# Patient Record
Sex: Female | Born: 1940 | Race: White | Hispanic: No | Smoking: Former smoker
Health system: Southern US, Community
[De-identification: ages and names within clinical notes are randomized; demographics above are authoritative.]

## PROBLEM LIST (undated history)

## (undated) DIAGNOSIS — I499 Cardiac arrhythmia, unspecified: Secondary | ICD-10-CM

## (undated) DIAGNOSIS — F329 Major depressive disorder, single episode, unspecified: Secondary | ICD-10-CM

## (undated) DIAGNOSIS — I6350 Cerebral infarction due to unspecified occlusion or stenosis of unspecified cerebral artery: Secondary | ICD-10-CM

## (undated) DIAGNOSIS — R51 Headache: Secondary | ICD-10-CM

## (undated) DIAGNOSIS — R519 Headache, unspecified: Secondary | ICD-10-CM

## (undated) DIAGNOSIS — L853 Xerosis cutis: Secondary | ICD-10-CM

## (undated) DIAGNOSIS — D649 Anemia, unspecified: Secondary | ICD-10-CM

## (undated) DIAGNOSIS — M1990 Unspecified osteoarthritis, unspecified site: Secondary | ICD-10-CM

## (undated) DIAGNOSIS — E785 Hyperlipidemia, unspecified: Secondary | ICD-10-CM

## (undated) DIAGNOSIS — I82409 Acute embolism and thrombosis of unspecified deep veins of unspecified lower extremity: Secondary | ICD-10-CM

## (undated) DIAGNOSIS — I219 Acute myocardial infarction, unspecified: Secondary | ICD-10-CM

## (undated) DIAGNOSIS — IMO0001 Reserved for inherently not codable concepts without codable children: Secondary | ICD-10-CM

## (undated) DIAGNOSIS — I2721 Secondary pulmonary arterial hypertension: Secondary | ICD-10-CM

## (undated) DIAGNOSIS — F32A Depression, unspecified: Secondary | ICD-10-CM

## (undated) DIAGNOSIS — J449 Chronic obstructive pulmonary disease, unspecified: Secondary | ICD-10-CM

## (undated) DIAGNOSIS — K219 Gastro-esophageal reflux disease without esophagitis: Secondary | ICD-10-CM

## (undated) DIAGNOSIS — N189 Chronic kidney disease, unspecified: Secondary | ICD-10-CM

## (undated) DIAGNOSIS — I10 Essential (primary) hypertension: Secondary | ICD-10-CM

## (undated) DIAGNOSIS — I2510 Atherosclerotic heart disease of native coronary artery without angina pectoris: Secondary | ICD-10-CM

## (undated) DIAGNOSIS — I509 Heart failure, unspecified: Secondary | ICD-10-CM

## (undated) HISTORY — PX: TONSILLECTOMY: SUR1361

## (undated) HISTORY — DX: Cerebral infarction due to unspecified occlusion or stenosis of unspecified cerebral artery: I63.50

## (undated) HISTORY — DX: Hyperlipidemia, unspecified: E78.5

## (undated) HISTORY — DX: Anemia, unspecified: D64.9

## (undated) HISTORY — PX: TUBAL LIGATION: SHX77

## (undated) HISTORY — DX: Atherosclerotic heart disease of native coronary artery without angina pectoris: I25.10

## (undated) HISTORY — DX: Acute myocardial infarction, unspecified: I21.9

## (undated) HISTORY — DX: Chronic obstructive pulmonary disease, unspecified: J44.9

## (undated) HISTORY — DX: Essential (primary) hypertension: I10

## (undated) HISTORY — PX: EYE SURGERY: SHX253

## (undated) HISTORY — DX: Chronic kidney disease, unspecified: N18.9

## (undated) HISTORY — DX: Acute embolism and thrombosis of unspecified deep veins of unspecified lower extremity: I82.409

## (undated) HISTORY — PX: OTHER SURGICAL HISTORY: SHX169

---

## 1967-12-10 HISTORY — PX: APPENDECTOMY: SHX54

## 1998-05-29 ENCOUNTER — Emergency Department (HOSPITAL_COMMUNITY): Admission: EM | Admit: 1998-05-29 | Discharge: 1998-05-29 | Payer: Self-pay | Admitting: *Deleted

## 2006-12-09 DIAGNOSIS — I219 Acute myocardial infarction, unspecified: Secondary | ICD-10-CM

## 2006-12-09 HISTORY — DX: Acute myocardial infarction, unspecified: I21.9

## 2007-06-23 ENCOUNTER — Inpatient Hospital Stay (HOSPITAL_COMMUNITY): Admission: EM | Admit: 2007-06-23 | Discharge: 2007-06-26 | Payer: Self-pay | Admitting: Emergency Medicine

## 2007-06-23 ENCOUNTER — Ambulatory Visit: Payer: Self-pay | Admitting: Cardiovascular Disease

## 2007-06-23 HISTORY — PX: CORONARY ANGIOPLASTY WITH STENT PLACEMENT: SHX49

## 2007-07-09 ENCOUNTER — Encounter (HOSPITAL_COMMUNITY): Admission: RE | Admit: 2007-07-09 | Discharge: 2007-10-07 | Payer: Self-pay | Admitting: Interventional Cardiology

## 2007-07-14 ENCOUNTER — Ambulatory Visit (HOSPITAL_COMMUNITY): Admission: RE | Admit: 2007-07-14 | Discharge: 2007-07-15 | Payer: Self-pay | Admitting: Interventional Cardiology

## 2007-10-08 ENCOUNTER — Encounter (HOSPITAL_COMMUNITY): Admission: RE | Admit: 2007-10-08 | Discharge: 2007-10-19 | Payer: Self-pay | Admitting: Interventional Cardiology

## 2009-04-12 ENCOUNTER — Encounter: Admission: RE | Admit: 2009-04-12 | Discharge: 2009-04-12 | Payer: Self-pay | Admitting: Family Medicine

## 2010-01-16 ENCOUNTER — Ambulatory Visit: Payer: Self-pay | Admitting: Family Medicine

## 2010-01-16 ENCOUNTER — Inpatient Hospital Stay (HOSPITAL_COMMUNITY): Admission: EM | Admit: 2010-01-16 | Discharge: 2010-01-19 | Payer: Self-pay | Admitting: Emergency Medicine

## 2010-01-16 LAB — CONVERTED CEMR LAB
Hgb A1c MFr Bld: 15.1 %
TSH: 0.938 microintl units/mL

## 2010-01-17 LAB — CONVERTED CEMR LAB
Cholesterol: 130 mg/dL
HDL: 32 mg/dL
LDL Cholesterol: 65 mg/dL
Triglycerides: 167 mg/dL

## 2010-01-18 ENCOUNTER — Ambulatory Visit: Payer: Self-pay | Admitting: Physical Medicine & Rehabilitation

## 2010-01-19 ENCOUNTER — Encounter: Payer: Self-pay | Admitting: Family Medicine

## 2010-01-22 ENCOUNTER — Encounter: Payer: Self-pay | Admitting: Family Medicine

## 2010-01-22 ENCOUNTER — Telehealth: Payer: Self-pay | Admitting: Family Medicine

## 2010-01-26 ENCOUNTER — Telehealth: Payer: Self-pay | Admitting: Family Medicine

## 2010-01-29 ENCOUNTER — Telehealth: Payer: Self-pay | Admitting: Family Medicine

## 2010-02-05 ENCOUNTER — Encounter: Payer: Self-pay | Admitting: Family Medicine

## 2010-02-15 ENCOUNTER — Encounter: Payer: Self-pay | Admitting: Family Medicine

## 2010-02-19 ENCOUNTER — Ambulatory Visit: Payer: Self-pay | Admitting: Family Medicine

## 2010-02-19 ENCOUNTER — Encounter: Payer: Self-pay | Admitting: Family Medicine

## 2010-02-19 DIAGNOSIS — J449 Chronic obstructive pulmonary disease, unspecified: Secondary | ICD-10-CM

## 2010-02-19 DIAGNOSIS — I2510 Atherosclerotic heart disease of native coronary artery without angina pectoris: Secondary | ICD-10-CM

## 2010-02-19 DIAGNOSIS — I10 Essential (primary) hypertension: Secondary | ICD-10-CM | POA: Insufficient documentation

## 2010-02-19 DIAGNOSIS — E785 Hyperlipidemia, unspecified: Secondary | ICD-10-CM

## 2010-02-19 DIAGNOSIS — E1129 Type 2 diabetes mellitus with other diabetic kidney complication: Secondary | ICD-10-CM | POA: Insufficient documentation

## 2010-02-19 DIAGNOSIS — I6350 Cerebral infarction due to unspecified occlusion or stenosis of unspecified cerebral artery: Secondary | ICD-10-CM

## 2010-02-19 HISTORY — DX: Cerebral infarction due to unspecified occlusion or stenosis of unspecified cerebral artery: I63.50

## 2010-02-19 HISTORY — DX: Atherosclerotic heart disease of native coronary artery without angina pectoris: I25.10

## 2010-02-20 LAB — CONVERTED CEMR LAB
ALT: 13 units/L (ref 0–35)
BUN: 37 mg/dL — ABNORMAL HIGH (ref 6–23)
CO2: 19 meq/L (ref 19–32)
Calcium: 9.6 mg/dL (ref 8.4–10.5)
Creatinine, Ser: 2.07 mg/dL — ABNORMAL HIGH (ref 0.40–1.20)
Glucose, Bld: 119 mg/dL — ABNORMAL HIGH (ref 70–99)
Potassium: 5.1 meq/L (ref 3.5–5.3)
Sodium: 138 meq/L (ref 135–145)

## 2010-02-21 ENCOUNTER — Ambulatory Visit (HOSPITAL_COMMUNITY): Admission: RE | Admit: 2010-02-21 | Discharge: 2010-02-21 | Payer: Self-pay | Admitting: Family Medicine

## 2010-02-21 ENCOUNTER — Ambulatory Visit: Payer: Self-pay | Admitting: Vascular Surgery

## 2010-02-21 ENCOUNTER — Encounter: Payer: Self-pay | Admitting: Family Medicine

## 2010-02-21 ENCOUNTER — Telehealth: Payer: Self-pay | Admitting: Family Medicine

## 2010-03-06 ENCOUNTER — Encounter: Payer: Self-pay | Admitting: Family Medicine

## 2010-03-06 ENCOUNTER — Ambulatory Visit: Payer: Self-pay | Admitting: Family Medicine

## 2010-03-06 LAB — CONVERTED CEMR LAB
BUN: 36 mg/dL — ABNORMAL HIGH (ref 6–23)
CO2: 21 meq/L (ref 19–32)
Calcium: 9.3 mg/dL (ref 8.4–10.5)
Chloride: 107 meq/L (ref 96–112)
Creatinine, Ser: 2.11 mg/dL — ABNORMAL HIGH (ref 0.40–1.20)
Glucose, Bld: 105 mg/dL — ABNORMAL HIGH (ref 70–99)
Potassium: 5.2 meq/L (ref 3.5–5.3)
Sodium: 138 meq/L (ref 135–145)

## 2010-03-08 ENCOUNTER — Ambulatory Visit: Payer: Self-pay | Admitting: Family Medicine

## 2010-03-08 DIAGNOSIS — I82409 Acute embolism and thrombosis of unspecified deep veins of unspecified lower extremity: Secondary | ICD-10-CM

## 2010-03-08 HISTORY — DX: Acute embolism and thrombosis of unspecified deep veins of unspecified lower extremity: I82.409

## 2010-03-08 LAB — CONVERTED CEMR LAB
Bilirubin Urine: NEGATIVE
Glucose, Urine, Semiquant: NEGATIVE
Ketones, urine, test strip: NEGATIVE
Nitrite: NEGATIVE
Protein, U semiquant: >=300
Specific Gravity, Urine: 1.015
Urobilinogen, UA: 0.2
pH: 6

## 2010-03-15 ENCOUNTER — Telehealth (INDEPENDENT_AMBULATORY_CARE_PROVIDER_SITE_OTHER): Payer: Self-pay | Admitting: Family Medicine

## 2010-03-22 ENCOUNTER — Encounter: Payer: Self-pay | Admitting: *Deleted

## 2010-03-22 LAB — CONVERTED CEMR LAB
Calcium: 10.7 mg/dL
Creatinine, Ser: 1.9 mg/dL

## 2010-03-26 ENCOUNTER — Encounter: Payer: Self-pay | Admitting: Family Medicine

## 2010-03-26 ENCOUNTER — Ambulatory Visit: Payer: Self-pay | Admitting: Family Medicine

## 2010-03-26 DIAGNOSIS — N184 Chronic kidney disease, stage 4 (severe): Secondary | ICD-10-CM | POA: Insufficient documentation

## 2010-03-26 DIAGNOSIS — K219 Gastro-esophageal reflux disease without esophagitis: Secondary | ICD-10-CM

## 2010-03-26 LAB — CONVERTED CEMR LAB
BUN: 31 mg/dL — ABNORMAL HIGH (ref 6–23)
CO2: 26 meq/L (ref 19–32)
Calcium: 11.1 mg/dL — ABNORMAL HIGH (ref 8.4–10.5)
Chloride: 106 meq/L (ref 96–112)
Creatinine, Ser: 1.81 mg/dL — ABNORMAL HIGH (ref 0.40–1.20)
Glucose, Bld: 113 mg/dL — ABNORMAL HIGH (ref 70–99)
Hgb A1c MFr Bld: 7.8 %
Potassium: 4.7 meq/L (ref 3.5–5.3)
Sodium: 143 meq/L (ref 135–145)

## 2010-03-29 ENCOUNTER — Ambulatory Visit: Payer: Self-pay | Admitting: Family Medicine

## 2010-03-29 ENCOUNTER — Encounter: Payer: Self-pay | Admitting: *Deleted

## 2010-03-29 ENCOUNTER — Encounter: Payer: Self-pay | Admitting: Family Medicine

## 2010-04-04 ENCOUNTER — Encounter: Payer: Self-pay | Admitting: Family Medicine

## 2010-04-16 ENCOUNTER — Encounter (HOSPITAL_COMMUNITY): Admission: RE | Admit: 2010-04-16 | Discharge: 2010-06-21 | Payer: Self-pay | Admitting: Nephrology

## 2010-04-17 ENCOUNTER — Ambulatory Visit: Payer: Self-pay | Admitting: Family Medicine

## 2010-04-19 ENCOUNTER — Encounter: Payer: Self-pay | Admitting: Family Medicine

## 2010-04-20 ENCOUNTER — Ambulatory Visit: Payer: Self-pay | Admitting: Oncology

## 2010-04-27 ENCOUNTER — Ambulatory Visit (HOSPITAL_COMMUNITY): Admission: RE | Admit: 2010-04-27 | Discharge: 2010-04-27 | Payer: Self-pay | Admitting: Oncology

## 2010-04-27 ENCOUNTER — Encounter: Payer: Self-pay | Admitting: Family Medicine

## 2010-04-27 LAB — CBC WITH DIFFERENTIAL/PLATELET
BASO%: 1.1 % (ref 0.0–2.0)
EOS%: 3.5 % (ref 0.0–7.0)
Eosinophils Absolute: 0.2 10*3/uL (ref 0.0–0.5)
HCT: 28.3 % — ABNORMAL LOW (ref 34.8–46.6)
MCH: 29.2 pg (ref 25.1–34.0)
MCHC: 34.1 g/dL (ref 31.5–36.0)
NEUT#: 3.9 10*3/uL (ref 1.5–6.5)
Platelets: 205 10*3/uL (ref 145–400)
RBC: 3.31 10*6/uL — ABNORMAL LOW (ref 3.70–5.45)
WBC: 6.4 10*3/uL (ref 3.9–10.3)

## 2010-04-30 ENCOUNTER — Ambulatory Visit: Payer: Self-pay | Admitting: Family Medicine

## 2010-05-01 LAB — COMPREHENSIVE METABOLIC PANEL
AST: 17 U/L (ref 0–37)
Alkaline Phosphatase: 69 U/L (ref 39–117)
CO2: 21 mEq/L (ref 19–32)
Creatinine, Ser: 2.08 mg/dL — ABNORMAL HIGH (ref 0.40–1.20)
Glucose, Bld: 91 mg/dL (ref 70–99)
Total Bilirubin: 0.3 mg/dL (ref 0.3–1.2)

## 2010-05-01 LAB — SPEP & IFE WITH QIG
Albumin ELP: 49.5 % — ABNORMAL LOW (ref 55.8–66.1)
Alpha-1-Globulin: 5.1 % — ABNORMAL HIGH (ref 2.9–4.9)
Beta Globulin: 7 % (ref 4.7–7.2)
Gamma Globulin: 15.8 % (ref 11.1–18.8)
M-Spike, %: 0.39 g/dL
Total Protein, Serum Electrophoresis: 6.8 g/dL (ref 6.0–8.3)

## 2010-05-03 LAB — UIFE/LIGHT CHAINS/TP QN, 24-HR UR
Albumin, U: DETECTED
Alpha 1, Urine: DETECTED — AB
Free Kappa Lt Chains,Ur: 14.3 mg/dL — ABNORMAL HIGH (ref 0.04–1.51)
Time: 24 hours
Total Protein, Urine-Ur/day: 2747 mg/d — ABNORMAL HIGH (ref 10–140)
Total Protein, Urine: 130.8 mg/dL
Volume, Urine: 2100 mL

## 2010-05-09 ENCOUNTER — Ambulatory Visit (HOSPITAL_COMMUNITY): Admission: RE | Admit: 2010-05-09 | Discharge: 2010-05-09 | Payer: Self-pay | Admitting: Oncology

## 2010-05-14 ENCOUNTER — Ambulatory Visit: Payer: Self-pay | Admitting: Family Medicine

## 2010-05-23 ENCOUNTER — Ambulatory Visit: Payer: Self-pay | Admitting: Oncology

## 2010-05-25 ENCOUNTER — Encounter: Payer: Self-pay | Admitting: Family Medicine

## 2010-06-26 ENCOUNTER — Encounter: Payer: Self-pay | Admitting: Family Medicine

## 2010-07-02 ENCOUNTER — Ambulatory Visit: Payer: Self-pay | Admitting: Family Medicine

## 2010-07-02 DIAGNOSIS — D472 Monoclonal gammopathy: Secondary | ICD-10-CM | POA: Insufficient documentation

## 2010-07-09 ENCOUNTER — Ambulatory Visit: Payer: Self-pay | Admitting: Oncology

## 2010-07-11 ENCOUNTER — Encounter: Payer: Self-pay | Admitting: Family Medicine

## 2010-07-11 LAB — CBC WITH DIFFERENTIAL/PLATELET
Eosinophils Absolute: 0.2 10*3/uL (ref 0.0–0.5)
HCT: 31 % — ABNORMAL LOW (ref 34.8–46.6)
HGB: 10.3 g/dL — ABNORMAL LOW (ref 11.6–15.9)
LYMPH%: 16.8 % (ref 14.0–49.7)
MCHC: 33.4 g/dL (ref 31.5–36.0)
MCV: 85.3 fL (ref 79.5–101.0)
MONO#: 0.5 10*3/uL (ref 0.1–0.9)
Platelets: 201 10*3/uL (ref 145–400)
RDW: 16.9 % — ABNORMAL HIGH (ref 11.2–14.5)
WBC: 9.9 10*3/uL (ref 3.9–10.3)
lymph#: 1.7 10*3/uL (ref 0.9–3.3)

## 2010-07-13 LAB — KAPPA/LAMBDA LIGHT CHAINS
Kappa free light chain: 3.28 mg/dL — ABNORMAL HIGH (ref 0.33–1.94)
Lambda Free Lght Chn: 3.62 mg/dL — ABNORMAL HIGH (ref 0.57–2.63)

## 2010-07-13 LAB — SPEP & IFE WITH QIG
Albumin ELP: 50.5 % — ABNORMAL LOW (ref 55.8–66.1)
Alpha-2-Globulin: 14.4 % — ABNORMAL HIGH (ref 7.1–11.8)
Beta 2: 5 % (ref 3.2–6.5)
Beta Globulin: 7.5 % — ABNORMAL HIGH (ref 4.7–7.2)
Gamma Globulin: 15.4 % (ref 11.1–18.8)
IgG (Immunoglobin G), Serum: 1090 mg/dL (ref 694–1618)
M-Spike, %: 0.37 g/dL

## 2010-07-13 LAB — COMPREHENSIVE METABOLIC PANEL
ALT: 14 U/L (ref 0–35)
AST: 16 U/L (ref 0–37)
Albumin: 3.8 g/dL (ref 3.5–5.2)
Calcium: 9.5 mg/dL (ref 8.4–10.5)
Total Bilirubin: 0.3 mg/dL (ref 0.3–1.2)

## 2010-08-02 ENCOUNTER — Encounter: Payer: Self-pay | Admitting: Family Medicine

## 2010-09-11 ENCOUNTER — Encounter: Payer: Self-pay | Admitting: Family Medicine

## 2010-09-26 ENCOUNTER — Encounter: Payer: Self-pay | Admitting: Family Medicine

## 2010-10-09 ENCOUNTER — Ambulatory Visit: Payer: Self-pay | Admitting: Oncology

## 2010-10-11 ENCOUNTER — Encounter: Payer: Self-pay | Admitting: Family Medicine

## 2010-10-11 LAB — CBC WITH DIFFERENTIAL/PLATELET
Eosinophils Absolute: 0.2 10*3/uL (ref 0.0–0.5)
HCT: 33.4 % — ABNORMAL LOW (ref 34.8–46.6)
HGB: 11.4 g/dL — ABNORMAL LOW (ref 11.6–15.9)
MCHC: 34 g/dL (ref 31.5–36.0)
MCV: 83.2 fL (ref 79.5–101.0)
MONO#: 0.6 10*3/uL (ref 0.1–0.9)
Platelets: 200 10*3/uL (ref 145–400)
RDW: 16.9 % — ABNORMAL HIGH (ref 11.2–14.5)
WBC: 8 10*3/uL (ref 3.9–10.3)

## 2010-10-15 LAB — COMPREHENSIVE METABOLIC PANEL
ALT: 15 U/L (ref 0–35)
Albumin: 4.5 g/dL (ref 3.5–5.2)
BUN: 60 mg/dL — ABNORMAL HIGH (ref 6–23)
Calcium: 10.1 mg/dL (ref 8.4–10.5)
Creatinine, Ser: 2.26 mg/dL — ABNORMAL HIGH (ref 0.40–1.20)
Sodium: 140 mEq/L (ref 135–145)

## 2010-10-15 LAB — SPEP & IFE WITH QIG
Albumin ELP: 51.3 % — ABNORMAL LOW (ref 55.8–66.1)
Alpha-1-Globulin: 4.6 % (ref 2.9–4.9)
Alpha-2-Globulin: 14 % — ABNORMAL HIGH (ref 7.1–11.8)
Beta 2: 5.8 % (ref 3.2–6.5)
Beta Globulin: 7.4 % — ABNORMAL HIGH (ref 4.7–7.2)
Gamma Globulin: 16.9 % (ref 11.1–18.8)
IgA: 329 mg/dL (ref 68–378)
IgG (Immunoglobin G), Serum: 1140 mg/dL (ref 694–1618)
IgM, Serum: 284 mg/dL — ABNORMAL HIGH (ref 60–263)
M-Spike, %: 0.55 g/dL
Total Protein, Serum Electrophoresis: 7.5 g/dL (ref 6.0–8.3)

## 2010-10-15 LAB — KAPPA/LAMBDA LIGHT CHAINS
Kappa free light chain: 3.53 mg/dL — ABNORMAL HIGH (ref 0.33–1.94)
Lambda Free Lght Chn: 4.27 mg/dL — ABNORMAL HIGH (ref 0.57–2.63)

## 2010-10-19 ENCOUNTER — Encounter: Payer: Self-pay | Admitting: Family Medicine

## 2010-11-20 ENCOUNTER — Telehealth: Payer: Self-pay | Admitting: Family Medicine

## 2010-12-30 ENCOUNTER — Encounter: Payer: Self-pay | Admitting: Oncology

## 2011-01-01 ENCOUNTER — Encounter: Payer: Self-pay | Admitting: Family Medicine

## 2011-01-06 LAB — CONVERTED CEMR LAB
ALT: 15 units/L (ref 0–35)
AST: 17 units/L (ref 0–37)
Albumin: 3.6 g/dL (ref 3.5–5.2)
Alkaline Phosphatase: 65 units/L (ref 39–117)
BUN: 39 mg/dL — ABNORMAL HIGH (ref 6–23)
CO2: 22 meq/L (ref 19–32)
Calcium, Total (PTH): 9.9 mg/dL (ref 8.4–10.5)
Calcium: 9.9 mg/dL (ref 8.4–10.5)
Chloride: 107 meq/L (ref 96–112)
Creatinine, Ser: 2.06 mg/dL — ABNORMAL HIGH (ref 0.40–1.20)
Glucose, Bld: 94 mg/dL (ref 70–99)
PTH: 39.4 pg/mL (ref 14.0–72.0)
Potassium: 4.5 meq/L (ref 3.5–5.3)
Sodium: 140 meq/L (ref 135–145)
Total Bilirubin: 0.3 mg/dL (ref 0.3–1.2)
Total Protein: 6.6 g/dL (ref 6.0–8.3)

## 2011-01-08 NOTE — Consult Note (Signed)
Summary: [facility]  [facility]   Imported By: De Nurse 10/18/2010 12:09:15  _____________________________________________________________________  External Attachment:    Type:   Image     Comment:   External Document

## 2011-01-08 NOTE — Consult Note (Signed)
Summary: [facility]  [facility]   Imported By: Clydell Hakim 05/16/2010 16:02:06  _____________________________________________________________________  External Attachment:    Type:   Image     Comment:   External Document

## 2011-01-08 NOTE — Assessment & Plan Note (Signed)
Summary: F/U VISIT/PER [facility]   Vital Signs:  Patient profile:   70 year old female Height:      61 inches Weight:      185.8 pounds Pulse rate:   66 / minute BP sitting:   134 / 78  (right arm)  Vitals Entered By: Arlyss Repress CMA, (March 08, 2010 1:35 PM) CC: Meet new PCP, elevated Cr. refill meds. referral to diab ctr Is Patient Diabetic? Yes Pain Assessment Patient in pain? no        Primary Care Provider:  Helane Rima DO  CC:  Meet new PCP and elevated Cr. refill meds. referral to diab ctr.  History of Present Illness: 70 yo WF:  Hx Reviewed: Admitted on 01/16/10 and d/c'd on 01/19/2010 w/ dx of weakness, uncontrolled DM, UTI, mild rhabdomyolysis, and ARI.  She states that she left the hospital and went to stay with her daughter to receive outpt therapy when she became confused w/ N/V and elevated blood sugars.  She was then admitted to [facility] hospital on 01/28/10 and d/c'd on 2/28/11with L leg DVT. Pt states that she was never informed of the diagnosis and never had pain but was started on Coumadin.  She has felt great ever since her discharge home.    1. DM:  Currently on Lantus 13 units in the PM with mealtime coverage (usually 2-4 units, never more than 8 units per day).  No adverse effects.  No hypoglycemic events.  CBGs checked 3 times a day and typically run b/w 110-170s.  Pt followed by an opthalmologist and seen within the past year. Patient requests DM education.  2. HTN: No adverse effects from medication.  Not checking it regularly.  No dizziness, HA, CP, palpitations, or swelling.  3. HLD: Tolerating medication.  No adverse effects.  No muscle pain or abd pain.  4. CKD: Baseline seems to be 1.55. TREND: (2/20) = 1.55, (2/28) = 1.6, (3/14) = 2.07 - ARB then held, (3/29) = 2.11. GFR now = 40 (stage 3). The patient denies NSAID use, urine retention. She endorses good fluid intake. She endorses DM retinopathy.    Habits &  Providers  Alcohol-Tobacco-Diet     Tobacco Status: quit  Current Medications (verified): 1)  Relion Short Pen Needles 31g X 8 Mm Misc (Insulin Pen Needle) .... Use As Directed Qid 2)  Onetouch Test  Strp (Glucose Blood) .... Per Instructions Qid 3)  Onetouch Finepoint Lancets  Misc (Lancets) .... Per Instructions Qid 4)  Proventil Hfa 108 (90 Base) Mcg/act Aers (Albuterol Sulfate) .... 2 Puffs Every 4 Hours As Needed For Wheezing 5)  Amlodipine Besylate 5 Mg Tabs (Amlodipine Besylate) .Marland Kitchen.. 1 Tab By Mouth Daily 6)  Metoprolol Tartrate 25 Mg Tabs (Metoprolol Tartrate) .Marland Kitchen.. 1 Tab By Mouth Two Times A Day 7)  Simvastatin 40 Mg Tabs (Simvastatin) 8)  Pantoprazole Sodium 40 Mg Tbec (Pantoprazole Sodium) .Marland Kitchen.. 1 Tab By Mouth Two Times A Day 9)  Advair Diskus 250-50 Mcg/dose Aepb (Fluticasone-Salmeterol) .Marland Kitchen.. 1 Inhalation Two Times A Day 10)  Lantus Solostar 100 Unit/ml Soln (Insulin Glargine) .Marland Kitchen.. 13 Units At Bedtime 11)  Fexofenadine Hcl 180 Mg Tabs (Fexofenadine Hcl) .Marland Kitchen.. 1 Tab By Mouth Daily 12)  Glipizide 5 Mg Xr24h-Tab (Glipizide) .... One By Mouth Daily  Allergies (verified): No Known Drug Allergies  Past History:  Past Medical History: IDDM CAD    - Cardiolyte: No evidence of ischemia or infarction. Normal left ventricular systolic function with EF 69%. Inferior diaphragmatic attenuation  artifact present. (01/29/10) Diastolic CHF    - Echo: EF > 55%. Moderate concentric LVH. Minimal aortic sclerosis. Mild TR. Moderate pulmonary HTN. Diastolic Dysfunction HTN HLD COPD Hx of CVA Left Leg DVT (01/2010)    - Acute DVT involving the peroneal and posterior tibial trunks of left calf. (01/30/10)    - Negative LE Doppler (02/21/10)    - Rx Coumadin Esophagitis    - EGD: LA Grade D reflux esophagitis. Gastritis without hemorrhage. Dudenitis without hemorrhage. Recs: PPI two times a day. Repeat EGD once off anticoagulants. (02/01/10) CKD    - Renal US: No hydronephrosis (01/28/10)    - CR  Trend: (2/20) = 1.55, (2/28) = 1.6, (3/14) = 2.07 - ARB then held, (3/29) = 2.11. GFR now = 40 (stage 3).  Family History: No pertinent FH.  Social History: Currently living with daughter. Divorced Retired.  12th grade education. Former smoker (started 1961, quit 11/2008). Drinks Wine and Liquior once a week. No Drug use. Exercises 3x/week. Cell phone [phone].Smoking Status:  quit  Review of Systems General:  Denies chills and fever. CV:  Denies chest pain or discomfort, palpitations, shortness of breath with exertion, and swelling of feet. Resp:  Denies cough. GI:  Denies change in bowel habits. GU:  Denies dysuria, hematuria, urinary frequency, and urinary hesitancy. Derm:  Denies rash. Neuro:  Denies numbness and tingling.  Physical Exam  General:  VS Reviewed. Elderly, well appearing, NAD. Vitals reviewed and WNL.   Neck:  No deformities, masses, or tenderness noted. Lungs:  Normal respiratory effort, chest expands symmetrically. Lungs are clear to auscultation, no crackles or wheezes. Heart:  Normal rate and regular rhythm. S1 and S2 normal without gallop, murmur, click, rub or other extra sounds. Abdomen:  Soft, NT, ND, no HSM, active BS.  Pulses:  R and L dorsalis pedis and posterior tibial pulses are full and equal bilaterally. Extremities:  No edema. Psych:  Oriented X3, memory intact for recent and remote, normally interactive, good eye contact, not anxious appearing, and not depressed appearing.    Diabetes Management Exam:    Foot Exam (with socks and/or shoes not present):       Sensory-Pinprick/Light touch:          Left medial foot (L-4): normal          Left dorsal foot (L-5): normal          Left lateral foot (S-1): normal          Right medial foot (L-4): normal          Right dorsal foot (L-5): normal          Right lateral foot (S-1): normal       Sensory-Monofilament:          Left foot: normal          Right foot: normal       Inspection:           Left foot: normal          Right foot: normal       Nails:          Left foot: normal          Right foot: normal   Impression & Recommendations:  Problem # 1:  IDDM (ICD-250.01) Assessment Unchanged  Patient only using  ~ 8 units Novolog daily. Advised patient to DC mealtime coverage. Continue Lantus. Start Glipizide. Monitor CBGs. Will review and adjust in 2-4 weeks. Holding Losartan 2/2  elevating Cr. Patient was instructed to DC ASA at last hospitalization 2/2 gastritis. The following medications were removed from the medication list:    Losartan Potassium 25 Mg Tabs (Losartan potassium) .Marland Kitchen... 1 tab by mouth daily    Humalog Pen 100 Unit/ml Soln (Insulin lispro (human)) .Marland Kitchen... Follow prescribed sliding scale Her updated medication list for this problem includes:    Lantus Solostar 100 Unit/ml Soln (Insulin glargine) .Marland Kitchen... 13 units at bedtime    Glipizide 5 Mg Xr24h-tab (Glipizide) ..... One by mouth daily  Orders: [facility]- Est  Level 4 (78938) Nutrition Referral (Nutrition) Nephrology Referral (Nephro)  Problem # 2:  ESSENTIAL HYPERTENSION (ICD-401.9) Assessment: Unchanged  At goal. Continue to monitor. The following medications were removed from the medication list:    Losartan Potassium 25 Mg Tabs (Losartan potassium) .Marland Kitchen... 1 tab by mouth daily Her updated medication list for this problem includes:    Amlodipine Besylate 5 Mg Tabs (Amlodipine besylate) .Marland Kitchen... 1 tab by mouth daily    Metoprolol Tartrate 25 Mg Tabs (Metoprolol tartrate) .Marland Kitchen... 1 tab by mouth two times a day  Orders: [facility]- Est  Level 4 (10175) Nephrology Referral (Nephro)  Problem # 3:  HYPERLIPIDEMIA (ICD-272.4) Assessment: Unchanged  Her updated medication list for this problem includes:    Simvastatin 40 Mg Tabs (Simvastatin)  Orders: [facility]- Est  Level 4 (10258)  Problem # 4:  RENAL INSUFFICIENCY, ACUTE (ICD-585.9) Assessment: Deteriorated Acute on Chronic. Cr continues to increase. GFR 40. Will manage  DM, HTN. Will hold NSAIDS, ACE/ARB for now. Obtain UA. Will have patient evaluated by Renal.  Orders: Urinalysis-[facility] (00000) [facility]- Est  Level 4 (52778) Nephrology Referral (Nephro)  Problem # 5:  DEEP VENOUS THROMBOPHLEBITIS, LEG, LEFT (ICD-453.40) Assessment: Unchanged  Continue Coumadin. Dosed by patient's cardiologist.  Orders: [facility]- Est  Level 4 (24235)  Complete Medication List: 1)  Relion Short Pen Needles 31g X 8 Mm Misc (Insulin pen needle) .... Use as directed qid 2)  Onetouch Test Strp (Glucose blood) .... Per instructions qid 3)  Onetouch Finepoint Lancets Misc (Lancets) .... Per instructions qid 4)  Proventil Hfa 108 (90 Base) Mcg/act Aers (Albuterol sulfate) .... 2 puffs every 4 hours as needed for wheezing 5)  Amlodipine Besylate 5 Mg Tabs (Amlodipine besylate) .Marland Kitchen.. 1 tab by mouth daily 6)  Metoprolol Tartrate 25 Mg Tabs (Metoprolol tartrate) .Marland Kitchen.. 1 tab by mouth two times a day 7)  Simvastatin 40 Mg Tabs (Simvastatin) 8)  Pantoprazole Sodium 40 Mg Tbec (Pantoprazole sodium) .Marland Kitchen.. 1 tab by mouth two times a day 9)  Advair Diskus 250-50 Mcg/dose Aepb (Fluticasone-salmeterol) .Marland Kitchen.. 1 inhalation two times a day 10)  Lantus Solostar 100 Unit/ml Soln (Insulin glargine) .Marland Kitchen.. 13 units at bedtime 11)  Fexofenadine Hcl 180 Mg Tabs (Fexofenadine hcl) .Marland Kitchen.. 1 tab by mouth daily 12)  Glipizide 5 Mg Xr24h-tab (Glipizide) .... One by mouth daily  Patient Instructions: 1)  Please schedule a follow-up appointment in 2 weeks with Dr. Earlene Plater. 2)  I have added Glipizide to your regimen. Take your Lantus in the morning. You may stop your mealtime coverage. 3)  We'll recheck the A1c in May. 4)  I am sending you to a kidney doctor. Prescriptions: GLIPIZIDE 5 MG XR24H-TAB (GLIPIZIDE) one by mouth daily  #30 x 0   Entered and Authorized by:   Helane Rima DO   Signed by:   Helane Rima DO on 03/08/2010   Method used:   Electronically to        [facility]  [address] [place] [phone]* (retail)       [phone]  [address], [place]  [address]       Ph: [phone] or [phone]       Fax: [phone]   RxID:   [id number]   Laboratory Results   Urine Tests  Date/Time Received: March 08, 2010 2:19 PM  Date/Time Reported: March 08, 2010 2:43 PM   Routine Urinalysis   Color: lt. yellow Appearance: Clear Glucose: negative   (Normal Range: Negative) Bilirubin: negative   (Normal Range: Negative) Ketone: negative   (Normal Range: Negative) Spec. Gravity: 1.015   (Normal Range: 1.003-1.035) Blood: trace-lysed   (Normal Range: Negative) pH: 6.0   (Normal Range: 5.0-8.0) Protein: >=300   (Normal Range: Negative) Urobilinogen: 0.2   (Normal Range: 0-1) Nitrite: negative   (Normal Range: Negative) Leukocyte Esterace: trace   (Normal Range: Negative)  Urine Microscopic WBC/HPF: 10-20 Bacteria/HPF: 2+ cocci some in chains Epithelial/HPF: 5-10 with few appearing to be clue cells    Comments: 9.5 cc spun ...............test performed by......Marland KitchenBonnie A. [place], MLS (ASCP)cm

## 2011-01-08 NOTE — Progress Notes (Signed)
Summary: Home care  Phone Note Call from Patient Call back at [phone]   Caller: Michelle/[facility] Reason for Call: Talk to Nurse Summary of Call: pt is not going to be picked up for home care b/c pt is not home bound Initial call taken by: Knox Royalty,  February 21, 2010 2:18 PM

## 2011-01-08 NOTE — Progress Notes (Signed)
Summary: copy of doppler  Phone Note From Other Clinic Call back at [phone]   Caller: Amanda/[facility] Summary of Call: requesting copy of lower extremity doppler to be faxed to [phone] Initial call taken by: Knox Royalty,  March 15, 2010 3:26 PM  Follow-up for Phone Call        Faxed Follow-up by: Gladstone Pih,  March 15, 2010 3:30 PM

## 2011-01-08 NOTE — Consult Note (Signed)
Summary: [facility]   Imported By: Bradly Bienenstock 03/14/2010 10:54:02  _____________________________________________________________________  External Attachment:    Type:   Image     Comment:   External Document

## 2011-01-08 NOTE — Letter (Signed)
Summary: Results Letter  [facility]  [address]   [place], [place] [address]   Phone: [phone]  Fax: [phone]    03/26/2010  Kristi [name] [address] [place], [place]  [address]  Dear [name],  I am happy to inform you that your A1c today was 7.8! Great job! Our new goal will be less than 7. I will see you next month.  Sincerely,   Helane Rima DO  Appended Document: Results Letter mailed.

## 2011-01-08 NOTE — Consult Note (Signed)
Summary: [facility]  [facility]   Imported By: De Nurse 04/27/2010 16:50:26  _____________________________________________________________________  External Attachment:    Type:   Image     Comment:   External Document

## 2011-01-08 NOTE — Assessment & Plan Note (Signed)
Summary: DISCUSS NUTRITION MANAGEMENT PER DR WALLACE/[facility]   Vital Signs:  Patient profile:   70 year old female Height:      61 inches Weight:      191.1 pounds BMI:     36.24  Vitals Entered By: Wyona Almas PHD (Apr 17, 2010 2:55 PM)  Primary Care Provider:  Helane Rima DO   History of Present Illness: Assessment:  Spent >60 minutes with pt.  Kristi [name] skips breakfast sometimes, but usually has 3 meals a day and variable snacks.  Daily intake includes 8-9 c diet sweet tea, diet flavored water, & 2 c coffee w/ artif swtnr.  Kristi [name] just received an iron infusion yesterday and today due to severe anemia.  Last A1C was 7.8.  24-hr recall suggests kcal intake of 1500-1600: B (6:30 AM)- 1 c coffee w/ sweetener; Snk (10:30 AM)- 12 oz Diet Sprite; L (12:30 PM)- 1 c home-cooked navy beans, 1 c potatoes, 1/2 c asparagus w/ American cheese, diet sweet tea; D (7 PM)- ([employer])  ~12 oz battered pork chops, 1 c mashed potatoes, 1 c green beans, diet sweet tea.  FBG have been running 111-185 during the past week.  Total energy intake before dinner was  ~500 kcal, and dinner meal provided >1,000 kcal.  Kristi [name] said she is very confused re. what to eat b/c she has received varying recommendations from different providers.  She is under the impression that what she needs to eat for her heart is different from what she needs for her DM.    Nutrition Diagnosis:  Food- and nutrition-related knowledge deficit (NB-1.1) related to nutrition for DM and heart health as evidenced by stated confusion and frustration.  Excessive energy intake (NI-1.5) related to as expenditure evidenced by no exercise (recently due to severe anemia), and intake yesterday of 1600 kcal.  Excessive fat intake (NI-51.2) related to meat and restaurant foods as evidenced by yesterday's intake of 12 oz of battered, fried pork chops.    Intervention: See Patient Instructions.    Monitoring/Eval: Dietary intake, body weight, and  exercise at 3-4-wk F/U.     Allergies: No Known Drug Allergies   Complete Medication List: 1)  Relion Short Pen Needles 31g X 8 Mm Misc (Insulin pen needle) .... Use as directed qid 2)  Onetouch Test Strp (Glucose blood) .... Per instructions qid 3)  Onetouch Finepoint Lancets Misc (Lancets) .... Per instructions qid 4)  Proventil Hfa 108 (90 Base) Mcg/act Aers (Albuterol sulfate) .... 2 puffs every 4 hours as needed for wheezing 5)  Amlodipine Besylate 5 Mg Tabs (Amlodipine besylate) .Marland Kitchen.. 1 tab by mouth daily 6)  Metoprolol Tartrate 25 Mg Tabs (Metoprolol tartrate) .Marland Kitchen.. 1 tab by mouth two times a day 7)  Simvastatin 40 Mg Tabs (Simvastatin) 8)  Omeprazole 20 Mg Cpdr (Omeprazole) .... One by mouth daily 9)  Advair Diskus 250-50 Mcg/dose Aepb (Fluticasone-salmeterol) .Marland Kitchen.. 1 inhalation two times a day 10)  Lantus Solostar 100 Unit/ml Soln (Insulin glargine) .Marland Kitchen.. 13 units in the am 11)  Fexofenadine Hcl 180 Mg Tabs (Fexofenadine hcl) .Marland Kitchen.. 1 tab by mouth daily 12)  Glipizide 5 Mg Xr24h-tab (Glipizide) .... One by mouth daily  Other Orders: Inital Assessment Each - [facility] [phone])  Patient Instructions: 1)  Saturated fat (animal fats and fried foods) makes your insulin not work as well (increased insulin resistance).  This means when you go out, try to avoid fried foods and large servings of meat, and choose the leaner  types of meat or fish.   2)  TASTE PREFERENCES ARE LEARNED! 3)  Limit starchy foods to 2 servings (exchanges) per meal and 1 per snack.  See handout for portion sizes.   4)  Fruits are fine, but use ONE portion (exchange) at a time.  The best fruits (that don't raise blood sugar a lot) are berries, peaches, pears, apples, unsweetened applesauce, cantaloupe.  Fruits to use smaller portions of include bananas (1/2 at a time), watermelon (limit to one serving), oranges (1/2 at a time), and in general, the tropical fruits.  5)  Include vegetables two times a day, trying to limit  vitamin K sources.   6)  Schedule a folllow-up appt in 3-4 wk.   7)  Call Dr. Gerilyn Pilgrim if any questions:  [phone].

## 2011-01-08 NOTE — Progress Notes (Signed)
Summary: triage  Phone Note Other Incoming Call back at [phone]   Caller: [facility] Nurse From [facility] Summary of Call: Blood sugar has been above 340 range.  She has appt Monday here at [facility]. Initial call taken by: Clydell Hakim,  January 26, 2010 1:47 PM  Follow-up for Phone Call        states her am cbgs are in 170s. she is eating whatever others have prepared for her. had lentil soup & cornbread last night. states PT came out today but did not do anything with her? told nurse to tell pt to drink lots of water & move around more. exercise as tolerated. has NP appt monday. asked her to start a food diary. call if feeling bad Follow-up by: Golden Circle RN,  January 26, 2010 1:49 PM

## 2011-01-08 NOTE — Progress Notes (Signed)
Summary: phn msg  Phone Note Call from Patient   Caller: Barbara-Daughter Summary of Call: Pt back in hospital. Initial call taken by: Clydell Hakim,  January 29, 2010 9:49 AM

## 2011-01-08 NOTE — Consult Note (Signed)
Summary: [facility]  [facility]   Imported By: De Nurse 05/01/2010 15:58:41  _____________________________________________________________________  External Attachment:    Type:   Image     Comment:   External Document

## 2011-01-08 NOTE — Consult Note (Signed)
Summary[facility]   Imported By: De Nurse 07/18/2010 14:17:29  _____________________________________________________________________  External Attachment:    Type:   Image     Comment:   External Document

## 2011-01-08 NOTE — Assessment & Plan Note (Signed)
Summary: F/U/[facility]   Vital Signs:  Patient profile:   70 year old female Height:      61 inches Weight:      194.8 pounds BMI:     36.94  Vitals Entered By: Wyona Almas PHD (May 14, 2010 3:53 PM)  Primary Care Provider:  Helane Rima DO   History of Present Illness: Assessment:  Spent 60 minutes with pt.  24-hr recall suggests kcal intake of  ~900: B- 2 c coffee w/ artif swtnr; L (3:30 PM)- guacamole & Tostitos, salad w/ 1 oz [place], 1/2  c cheese, 4 tbsp fat-free ranch, artif swt iced tea; Snk (PM)- artif swt iced tea; D (5 PM)- 1 glass wine,  ~2 c Cheetos.   FBG has been 150-200, significantly up from previous 90-100s.  Ms. Lubinski is waiting to hear results of recent tests for multiple myeloma.  She talked of memories of her mother dying from this same cancer, and the distress she has felt as she awaits the results.  She said being careful about her diet feels futile at this time, as she wonders if she will die of cancer, kidney dysfunction, heart disease, or COPD.  Food has obviously been a source of comfort during this time of stress.  She has an appt on June 17th with her oncologist, but hopes to get results before that.  Meanwhile, Ms. Bednarz said she is not sure she can really focus on the dietary changes she needs to make.  She said yesterday's intake was actually much better than recent usual food choices.  When asked what allowed better choices yesterday, she said she felt more at peace, having been to church in the morning.  We talked of how she can gain more of that feeling on non-Sundays.    Nutrition Diagnosis:  Food- and nutrition-related knowledge deficit (NB-1.1) related to nutrition for DM and heart health as evidenced by stated confusion and frustration.  Excessive energy intake (NI-1.5) related to expenditure evidenced by no exercise.  Excessive fat intake (NI-51.2) related to processed foods as evidenced by yesterday's intake that was  ~60% fat.    Intervention:  See Patient  Instructions.    Monitoring/Eval:  Ms. Burdo will decide if/when she wants to follow up with a nutrition appt.     Allergies: No Known Drug Allergies   Complete Medication List: 1)  Relion Short Pen Needles 31g X 8 Mm Misc (Insulin pen needle) .... Use as directed qid 2)  Onetouch Test Strp (Glucose blood) .... Per instructions qid 3)  Onetouch Finepoint Lancets Misc (Lancets) .... Per instructions qid 4)  Proventil Hfa 108 (90 Base) Mcg/act Aers (Albuterol sulfate) .... 2 puffs every 4 hours as needed for wheezing 5)  Amlodipine Besylate 5 Mg Tabs (Amlodipine besylate) .Marland Kitchen.. 1 tab by mouth daily 6)  Metoprolol Tartrate 25 Mg Tabs (Metoprolol tartrate) .Marland Kitchen.. 1 tab by mouth two times a day 7)  Simvastatin 40 Mg Tabs (Simvastatin) 8)  Omeprazole 20 Mg Cpdr (Omeprazole) .... One by mouth daily 9)  Advair Diskus 250-50 Mcg/dose Aepb (Fluticasone-salmeterol) .Marland Kitchen.. 1 inhalation two times a day 10)  Lantus Solostar 100 Unit/ml Soln (Insulin glargine) .Marland Kitchen.. 13 units in the am 11)  Fexofenadine Hcl 180 Mg Tabs (Fexofenadine hcl) .Marland Kitchen.. 1 tab by mouth daily 12)  Glipizide 5 Mg Xr24h-tab (Glipizide) .... One by mouth daily  Other Orders: Reassessment Each 15 min unit- [facility] [phone])  Patient Instructions: 1)  Try to pay attention to your food choices  to determine what's behind your choices.   2)  Social eating:  Focus on your friends and conversation rather than mainly on the food.  Also, don't go out to eat when you are very hungry.  Take the edge off your hunger with a small snack.   3)  Make your choices very conscious! 4)  Try to keep healthy foods on hand, and think twice about snakc foods or junk foods.  You can always find a single portion somewhere.   5)  Promise yourself that you will treat yourself only with the highest quality foods.   6)  Challenge:  Schedule meditation time; ask around for reading materials that may help you focus.   7)  Challenge #2:  Figure out a way to visit the [facility]! 8)  Plan to call for appt when you feel ready to work on this, and call Dr. Gerilyn Pilgrim if you have any questions:  [phone].

## 2011-01-08 NOTE — Consult Note (Signed)
Summary: Dr Hanley Seamen, O.D.  Dr Hanley Seamen, O.D.   Imported By: De Nurse 08/21/2010 11:32:54  _____________________________________________________________________  External Attachment:    Type:   Image     Comment:   External Document

## 2011-01-08 NOTE — Assessment & Plan Note (Signed)
Summary: fu/kh   Vital Signs:  Patient profile:   70 year old female Height:      61 inches Weight:      185 pounds BMI:     35.08 Temp:     97.7 degrees F oral Pulse rate:   73 / minute BP sitting:   136 / 75  (left arm) Cuff size:   regular  Vitals Entered By: Tessie Fass CMA (March 26, 2010 2:10 PM) CC: F/U diabetes Is Patient Diabetic? Yes Pain Assessment Patient in pain? no        Primary Care Provider:  Helane Rima DO  CC:  F/U diabetes.  History of Present Illness: 70 yo WF:  1. DM:  Lantus 13 units each am, Glipizide with dinner (largest meal). Checks CBG 3 times daily. Lowest BG 76 with no sxs, highest was 242.  Usually, it is in the 100s.  Pt followed by an opthalmologist and seen within the past year. Patient requests DM education. Exercises 3 times per week.  2. CKD: Baseline seems to be 1.55. TREND: (2/20) = 1.55, (2/28) = 1.6, (3/14) = 2.07 - ARB then held, (3/29) = 2.11. GFR now = 40 (stage 3). The patient denies NSAID use, urine retention. She endorses good fluid intake. She endorses DM retinopathy.  3. GERD: Ran out of Protonix. Still has dyspepsia, esp at night.    Habits & Providers  Alcohol-Tobacco-Diet     Tobacco Status: quit  Current Medications (verified): 1)  Relion Short Pen Needles 31g X 8 Mm Misc (Insulin Pen Needle) .... Use As Directed Qid 2)  Onetouch Test  Strp (Glucose Blood) .... Per Instructions Qid 3)  Onetouch Finepoint Lancets  Misc (Lancets) .... Per Instructions Qid 4)  Proventil Hfa 108 (90 Base) Mcg/act Aers (Albuterol Sulfate) .... 2 Puffs Every 4 Hours As Needed For Wheezing 5)  Amlodipine Besylate 5 Mg Tabs (Amlodipine Besylate) .Marland Kitchen.. 1 Tab By Mouth Daily 6)  Metoprolol Tartrate 25 Mg Tabs (Metoprolol Tartrate) .Marland Kitchen.. 1 Tab By Mouth Two Times A Day 7)  Simvastatin 40 Mg Tabs (Simvastatin) 8)  Omeprazole 20 Mg Cpdr (Omeprazole) .... One By Mouth Daily 9)  Advair Diskus 250-50 Mcg/dose Aepb (Fluticasone-Salmeterol) .Marland Kitchen..  1 Inhalation Two Times A Day 10)  Lantus Solostar 100 Unit/ml Soln (Insulin Glargine) .Marland Kitchen.. 13 Units in The Am 11)  Fexofenadine Hcl 180 Mg Tabs (Fexofenadine Hcl) .Marland Kitchen.. 1 Tab By Mouth Daily 12)  Glipizide 5 Mg Xr24h-Tab (Glipizide) .... One By Mouth Daily  Allergies (verified): No Known Drug Allergies  Past History:  Past medical, surgical, family and social histories (including risk factors) reviewed for relevance to current acute and chronic problems.  Past Medical History: Reviewed history from 03/08/2010 and no changes required. IDDM CAD    - Cardiolyte: No evidence of ischemia or infarction. Normal left ventricular systolic function with EF 69%. Inferior diaphragmatic attenuation artifact present. (01/29/10) Diastolic CHF    - Echo: EF > 55%. Moderate concentric LVH. Minimal aortic sclerosis. Mild TR. Moderate pulmonary HTN. Diastolic Dysfunction HTN HLD COPD Hx of CVA Left Leg DVT (01/2010)    - Acute DVT involving the peroneal and posterior tibial trunks of left calf. (01/30/10)    - Negative LE Doppler (02/21/10)    - Rx Coumadin Esophagitis    - EGD: LA Grade D reflux esophagitis. Gastritis without hemorrhage. Dudenitis without hemorrhage. Recs: PPI two times a day. Repeat EGD once off anticoagulants. (02/01/10) CKD    - Renal US: No hydronephrosis (  01/28/10)    - CR Trend: (2/20) = 1.55, (2/28) = 1.6, (3/14) = 2.07 - ARB then held, (3/29) = 2.11. GFR now = 40 (stage 3).  Past Surgical History: Reviewed history from 02/19/2010 and no changes required. Appendectomy Stent placement BTL  Family History: Reviewed history from 03/08/2010 and no changes required. No pertinent FH.  Social History: Reviewed history from 03/08/2010 and no changes required. Currently living with daughter. Divorced Retired.  12th grade education. Former smoker (started 1961, quit 11/2008). Drinks Wine and Liquior once a week. No Drug use. Exercises 3x/week. Cell phone [phone].  Review of  Systems General:  Denies chills and fever. Eyes:  Denies blurring. CV:  Denies chest pain or discomfort, shortness of breath with exertion, and swelling of feet. GI:  Denies change in bowel habits. Derm:  Denies rash. Neuro:  Complains of numbness and tingling. Endo:  Denies excessive thirst and excessive urination.  Physical Exam  General:  VS Reviewed. Elderly, well appearing, NAD. Vitals reviewed and WNL.  Neck:  No deformities, masses, or tenderness noted. Lungs:  Normal respiratory effort, chest expands symmetrically. Lungs are clear to auscultation, no crackles or wheezes. Heart:  Normal rate and regular rhythm. S1 and S2 normal without gallop, murmur, click, rub or other extra sounds. Pulses:  R and L dorsalis pedis and posterior tibial pulses are full and equal bilaterally. Extremities:  No edema. Skin:  Intact without suspicious lesions or rashes. Psych:  Oriented X3, memory intact for recent and remote, normally interactive, good eye contact, not anxious appearing, and not depressed appearing.    Diabetes Management Exam:    Foot Exam (with socks and/or shoes not present):       Sensory-Pinprick/Light touch:          Left medial foot (L-4): normal          Left dorsal foot (L-5): normal          Left lateral foot (S-1): normal          Right medial foot (L-4): normal          Right dorsal foot (L-5): normal          Right lateral foot (S-1): normal       Sensory-Monofilament:          Left foot: normal          Right foot: normal       Inspection:          Left foot: normal          Right foot: normal       Nails:          Left foot: normal          Right foot: normal   Impression & Recommendations:  Problem # 1:  IDDM (ICD-250.01) Assessment Improved A1c 7.8 today. 01/2010 = 15.1. Continue with current regimen.  Her updated medication list for this problem includes:    Lantus Solostar 100 Unit/ml Soln (Insulin glargine) .Marland Kitchen... 13 units in the am    Glipizide 5  Mg Xr24h-tab (Glipizide) ..... One by mouth daily  Orders: Basic Met-[facility] [id number]) A1C-[facility] (29562) [facility]- Est  Level 4 (13086)  Problem # 2:  CHRONIC KIDNEY DISEASE STAGE III (MODERATE) (ICD-585.3) Assessment: Unchanged Recheck BMP today. Refer to Nephrology. Holding nephrotoxic meds, including ARB. Orders: [facility]- Est  Level 4 (57846)  Problem # 3:  GERD (ICD-530.81) Assessment: Unchanged  Her updated medication list for this problem includes:  Omeprazole 20 Mg Cpdr (Omeprazole) ..... One by mouth daily  Orders: [facility]- Est  Level 4 (99214)  Complete Medication List: 1)  Relion Short Pen Needles 31g X 8 Mm Misc (Insulin pen needle) .... Use as directed qid 2)  Onetouch Test Strp (Glucose blood) .... Per instructions qid 3)  Onetouch Finepoint Lancets Misc (Lancets) .... Per instructions qid 4)  Proventil Hfa 108 (90 Base) Mcg/act Aers (Albuterol sulfate) .... 2 puffs every 4 hours as needed for wheezing 5)  Amlodipine Besylate 5 Mg Tabs (Amlodipine besylate) .Marland Kitchen.. 1 tab by mouth daily 6)  Metoprolol Tartrate 25 Mg Tabs (Metoprolol tartrate) .Marland Kitchen.. 1 tab by mouth two times a day 7)  Simvastatin 40 Mg Tabs (Simvastatin) 8)  Omeprazole 20 Mg Cpdr (Omeprazole) .... One by mouth daily 9)  Advair Diskus 250-50 Mcg/dose Aepb (Fluticasone-salmeterol) .Marland Kitchen.. 1 inhalation two times a day 10)  Lantus Solostar 100 Unit/ml Soln (Insulin glargine) .Marland Kitchen.. 13 units in the am 11)  Fexofenadine Hcl 180 Mg Tabs (Fexofenadine hcl) .Marland Kitchen.. 1 tab by mouth daily 12)  Glipizide 5 Mg Xr24h-tab (Glipizide) .... One by mouth daily  Patient Instructions: 1)  Please schedule a follow-up appointment in 4-6 weeks with Dr. Earlene Plater. 2)  I am sending you to a kidney doctor. 3)  Please make an appointment to see Dr. Gerilyn Pilgrim or nutrition help. Prescriptions: OMEPRAZOLE 20 MG CPDR (OMEPRAZOLE) one by mouth daily  #90 x 3   Entered and Authorized by:   Helane Rima DO   Signed by:   Helane Rima DO on 03/26/2010   Method  used:   Electronically to        [facility]. [phone]* (retail)       [address]       [place], [place]  [address]       Ph: [phone] or [phone]       Fax: [phone]   RxID:   [id number]   Prevention & Chronic Care Immunizations   Influenza vaccine: Not documented   Influenza vaccine due: Not Indicated    Tetanus booster: Not documented    Pneumococcal vaccine: Not documented    H. zoster vaccine: Not documented  Colorectal Screening   Hemoccult: Not documented   Hemoccult action/deferral: Not indicated  (03/26/2010)    Colonoscopy: Not documented  Other Screening   Pap smear: Not documented    Mammogram: Not documented    DXA bone density scan: Not documented   Smoking status: quit  (03/26/2010)  Diabetes Mellitus   HgbA1C: 7.8  (03/26/2010)   HgbA1C action/deferral: Ordered  (03/26/2010)    Eye exam: Not documented    Foot exam: yes  (03/26/2010)   High risk foot: Not documented   Foot care education: Not documented    Urine microalbumin/creatinine ratio: Not documented    Diabetes flowsheet reviewed?: Yes   Progress toward A1C goal: Unchanged  Lipids   Total Cholesterol: Not documented   LDL: Not documented   LDL Direct: Not documented   HDL: Not documented   Triglycerides: Not documented    SGOT (AST): 15  (02/19/2010)   SGPT (ALT): 13  (02/19/2010)   Alkaline phosphatase: 57  (02/19/2010)   Total bilirubin: 0.4  (02/19/2010)    Lipid flowsheet reviewed?: Yes   Progress toward LDL goal: Unchanged  Hypertension   Last Blood Pressure: 136 / 75  (03/26/2010)   Serum creatinine: 2.11  (03/06/2010)   Serum potassium 5.2  (03/06/2010)    Hypertension flowsheet  reviewed?: Yes   Progress toward BP goal: At goal  Self-Management Support :   Personal Goals (by the next clinic visit) :     Personal A1C goal: 8  (02/19/2010)     Personal blood pressure goal: 140/90  (02/19/2010)     Personal LDL goal: 100  (02/19/2010)     Patient will work on the following items until the next clinic visit to reach self-care goals:     Medications and monitoring: take my medicines every day, bring all of my medications to every visit  (03/26/2010)     Eating: drink diet soda or water instead of juice or soda, eat more vegetables, use fresh or frozen vegetables, eat foods that are low in salt, eat baked foods instead of fried foods, eat fruit for snacks and desserts, limit or avoid alcohol  (03/26/2010)     Activity: take a 30 minute walk every day, take the stairs instead of the elevator, park at the far end of the parking lot  (03/26/2010)    Diabetes self-management support: CBG self-monitoring log, Written self-care plan  (03/26/2010)   Diabetes care plan printed    Hypertension self-management support: Written self-care plan  (03/26/2010)   Hypertension self-care plan printed.    Lipid self-management support: Written self-care plan  (03/26/2010)   Lipid self-care plan printed.   Nursing Instructions: HgbA1C today (see order)   Laboratory Results   Blood Tests   Date/Time Received: March 26, 2010 2:58 PM  Date/Time Reported: March 26, 2010 3:40 PM   HGBA1C: 7.8%   (Normal Range: Non-Diabetic - 3-6%   Control Diabetic - 6-8%)  Comments: ...........test performed by...........Marland KitchenTerese Door, CMA

## 2011-01-08 NOTE — Miscellaneous (Signed)
Summary: ECHO Report   EC 2-D Echo - STATUS: Final  .                                         Perform Date: [id number] 10:15  Ordered By: Amedeo Kinsman Date: 10Feb11 14:38  Facility: [facility]                              Department: ECHO  Service Report Text  [facility]*                      *[facility]                            [address]                           [place], [place] [address]                               [phone]    --------------------------------------------------------------------   Transthoracic Echocardiography    Patient:    Kristi [name], Kristi [name]   MR #:       [id number]   Study Date: 01/19/2010   Gender:     F   Age:        70   Height:     152.4cm   Weight:     89.1kg   BSA:        1.63m^2   Pt. Status:   Room:       6707     PERFORMING   [facility], Ec    SONOGRAPHER  Gillermo Murdoch    Nyoka Cowden   cc:    --------------------------------------------------------------------   Indications:   Hypertension - benign 401.1.    --------------------------------------------------------------------   History:  PMH: Coronary artery disease. Risk factors: Diabetes   mellitus. Dyslipidemia.    --------------------------------------------------------------------   Study Conclusions    - Left ventricle: The cavity size was normal. Wall thickness was     normal. Systolic function was normal. The estimated ejection     fraction was in the range of 60% to 65%. Wall motion was normal;     there were no regional wall motion abnormalities. Left ventricular     diastolic function parameters were normal.   - Mitral valve: Calcified annulus.   - Atrial septum: There was increased thickness of the septum,     consistent with lipomatous hypertrophy.   Transthoracic echocardiography. M-mode, complete 2D, spectral   Doppler, and color Doppler. Height: Height: 152.4cm. Height: 60in.  Weight: Weight: 89.1kg. Weight: 196lb. Body mass index: BMI:   38.4kg/m^2. Body surface area: BSA: 1.49m^2. Patient status:   Inpatient. Location: Echo laboratory.    --------------------------------------------------------------------    --------------------------------------------------------------------   Left ventricle: The cavity size was normal. Wall thickness was   normal. Systolic function was normal. The estimated ejection   fraction was in the range of 60% to 65%. Wall motion was normal;   there were no regional wall motion abnormalities. The transmitral   flow pattern was normal. The deceleration time  of the early   transmitral flow velocity was normal. The pulmonary vein flow   pattern was normal. The tissue Doppler parameters were normal. Left   ventricular diastolic function parameters were normal.    --------------------------------------------------------------------   Aortic valve: Trileaflet. Doppler: There was no stenosis. No   significant regurgitation.    --------------------------------------------------------------------   Aorta: The aorta was normal, not dilated, and non-diseased.    --------------------------------------------------------------------   Mitral valve: Calcified annulus. Doppler:   Peak gradient: 3mm Hg   (D).   --------------------------------------------------------------------   Left atrium: The atrium was normal in size.    --------------------------------------------------------------------   Atrial septum: There was increased thickness of the septum,   consistent with lipomatous hypertrophy.    --------------------------------------------------------------------   Right ventricle: The cavity size was normal. Wall thickness was   normal. Systolic function was normal.    --------------------------------------------------------------------   Pulmonic valve: Poorly visualized. Structurally normal valve. Cusp   separation was normal.  Doppler: Transvalvular velocity was within   the normal range. No regurgitation.    --------------------------------------------------------------------   Tricuspid valve: Structurally normal valve. Leaflet separation was   normal. Doppler: Transvalvular velocity was within the normal range.   No regurgitation.    --------------------------------------------------------------------   Pulmonary artery: Poorly visualized.    --------------------------------------------------------------------   Right atrium: The atrium was normal in size.    --------------------------------------------------------------------   Pericardium: The pericardium was normal in appearance. There was no   pericardial effusion.    --------------------------------------------------------------------   Systemic veins:   Inferior vena cava: The vessel was normal in size; the respirophasic   diameter changes were in the normal range (= 50%); findings are   consistent with normal central venous pressure.    --------------------------------------------------------------------   Post procedure conclusions   Ascending Aorta:    - The aorta was normal, not dilated, and non-diseased.    --------------------------------------------------------------------    2D measurements            Normal  Doppler measurements      Normal   Left ventricle                     Left ventricle   LVID ED,         34 mm     43-52   Ea, lat ann,    7.9 cm/s  -------   chord, PLAX                        tiss DP   LVID ES,         17 mm     23-38   E/Ea, lat     11.75       -------   chord, PLAX                        ann, tiss DP   FS, chord,       50 %      >29     Ea, med ann,   5.46 cm/s  -------   PLAX                               tiss DP   LVPW, ED         10 mm     ------  E/Ea, med        17       -------  IVS/LVPW        0.9        <1.3    ann, tiss DP   ratio, ED                          Mitral valve   Ventricular septum                  Peak E vel     92.8 cm/s  -------   IVS, ED           9 mm     ------  Peak A vel     65.6 cm/s  -------   Aorta                              Deceleration    187 ms    150-230   Root diam, ED    29 mm     ------  time   Left atrium                        Peak              3 mm Hg -------   AP dim           34 mm     ------  gradient, D   AP dim index   1.71 cm/m^2 <2.2    Peak E/A        1.4       -------                                      ratio    --------------------------------------------------------------------   Prepared and Electronically Authenticated by    Lyn Records   2011-02-11T19:33:[id number]  Additional Information  [id number] RESULT STATUS : F  External IF Update Timestamp : 2010-01-19:19:34:00.000000

## 2011-01-08 NOTE — Assessment & Plan Note (Signed)
Summary: CKD Update - Stage III   Allergies: No Known Drug Allergies   Complete Medication List: 1)  Relion Short Pen Needles 31g X 8 Mm Misc (Insulin pen needle) .... Use as directed qid 2)  Onetouch Test Strp (Glucose blood) .... Per instructions qid 3)  Onetouch Finepoint Lancets Misc (Lancets) .... Per instructions qid 4)  Proventil Hfa 108 (90 Base) Mcg/act Aers (Albuterol sulfate) .... 2 puffs every 4 hours as needed for wheezing 5)  Norvasc 10 Mg Tabs (Amlodipine besylate) .... One by mouth daily 6)  Metoprolol Tartrate 25 Mg Tabs (Metoprolol tartrate) .Marland Kitchen.. 1 tab by mouth two times a day 7)  Simvastatin 40 Mg Tabs (Simvastatin) 8)  Omeprazole 20 Mg Cpdr (Omeprazole) .... One by mouth daily 9)  Advair Diskus 250-50 Mcg/dose Aepb (Fluticasone-salmeterol) .Marland Kitchen.. 1 inhalation two times a day 10)  Lantus Solostar 100 Unit/ml Soln (Insulin glargine) .Marland Kitchen.. 13 units in the am 11)  Fexofenadine Hcl 180 Mg Tabs (Fexofenadine hcl) .Marland Kitchen.. 1 tab by mouth daily 12)  Glipizide 5 Mg Xr24h-tab (Glipizide) .... One by mouth daily 13)  Aspirin 325 Mg Tabs (Aspirin) .... Take 1 tab by mouth every day 14)  Allegra 180 Mg Tabs (Fexofenadine hcl) .... One by mouth daily 15)  Alendronate Sodium 70 Mg Tabs (Alendronate sodium) .Marland Kitchen.. 1 by mouth q week . take with 8 ounces of water on and empty stomach 16)  Nitrostat 0.4 Mg Subl (Nitroglycerin) .... At first sign of chest pain, q 5 min, up to 3 doses

## 2011-01-08 NOTE — Consult Note (Signed)
Summary: [facility] Hem/Onc  [facility] Hem/Onc   Imported By: De Nurse 10/30/2010 10:51:04  _____________________________________________________________________  External Attachment:    Type:   Image     Comment:   External Document

## 2011-01-08 NOTE — Assessment & Plan Note (Signed)
Summary: F/U VISIT [facility]   Vital Signs:  Patient profile:   70 year old female Height:      61 inches Weight:      191.2 pounds BMI:     36.26 Pulse rate:   63 / minute BP sitting:   130 / 70  (left arm) Cuff size:   regular  Vitals Entered By: Arlyss Repress CMA, (Apr 30, 2010 11:35 AM) CC: f/up nephrologist. Is Patient Diabetic? Yes Pain Assessment Patient in pain? no        Primary Care Provider:  Helane Rima DO  CC:  f/up nephrologist..  History of Present Illness: Pleasant 70 year old female:  1. DM:  Lantus 13 units each am, Glipizide with dinner (largest meal). Checks CBG 1-2 times daily. Lowest BG 83 with no sxs, highest was 216.  Usually, it is in the 100s.  Pt followed by an opthalmologist and seen within the past year. Patient has been participating in DM education. Exercises 3 times per week.  2. CKD: Stage 3-4. Recently seen by Dr. Eliott Nine. See scanned consult report for full details. Rx Calcitriol, Iron Infusion.  3. Possible Multiple Meyeloma: Evaluated by Heme-Onc 5/20. Had full skeletal XRay and labs drawn. She has a follow-up appointment in 2 weeks. She states that she was told that she either doesn't have MM or it is very early in the disease process.  4. Coumadin: May be stopped at next visit with cardiology in June.  Habits & Providers  Alcohol-Tobacco-Diet     Tobacco Status: quit     Tobacco Counseling: not to resume use of tobacco products  Current Medications (verified): 1)  Relion Short Pen Needles 31g X 8 Mm Misc (Insulin Pen Needle) .... Use As Directed Qid 2)  Onetouch Test  Strp (Glucose Blood) .... Per Instructions Qid 3)  Onetouch Finepoint Lancets  Misc (Lancets) .... Per Instructions Qid 4)  Proventil Hfa 108 (90 Base) Mcg/act Aers (Albuterol Sulfate) .... 2 Puffs Every 4 Hours As Needed For Wheezing 5)  Amlodipine Besylate 5 Mg Tabs (Amlodipine Besylate) .Marland Kitchen.. 1 Tab By Mouth Daily 6)  Metoprolol Tartrate 25 Mg Tabs (Metoprolol Tartrate)  .Marland Kitchen.. 1 Tab By Mouth Two Times A Day 7)  Simvastatin 40 Mg Tabs (Simvastatin) 8)  Omeprazole 20 Mg Cpdr (Omeprazole) .... One By Mouth Daily 9)  Advair Diskus 250-50 Mcg/dose Aepb (Fluticasone-Salmeterol) .Marland Kitchen.. 1 Inhalation Two Times A Day 10)  Lantus Solostar 100 Unit/ml Soln (Insulin Glargine) .Marland Kitchen.. 13 Units in The Am 11)  Fexofenadine Hcl 180 Mg Tabs (Fexofenadine Hcl) .Marland Kitchen.. 1 Tab By Mouth Daily 12)  Glipizide 5 Mg Xr24h-Tab (Glipizide) .... One By Mouth Daily  Allergies (verified): No Known Drug Allergies  Past History:  Past Medical History: DM    - Insulin-Requiring    - Dx 2011    - Reported DM changes by Optho 10/2009 CAD    - Followed by Dr. Eldridge Dace    - Cough with ACE    - Cardiolyte: No evidence of ischemia or infarction. Normal LV systolic function with EF69%. (01/29/10) Diastolic CHF    - Echo: EF > 55%. Mod concentric LVH. Min aortic sclerosis. Mild TR. Mod pulmonary HTN. Diastolic Dysfxn. HTN HLD COPD Hx of Silent CVA Left Leg DVT (01/2010)    - Acute DVT involving the peroneal and posterior tibial trunks of left calf. (01/30/10)    - Negative LE Doppler (02/21/10)    - Rx Coumadin Esophagitis    - EGD: LA Grade  D reflux esophagitis. Gastritis without hemorrhage. Dudenitis without hemorrhage. Recs: PPI two times a day. Repeat EGD once off anticoagulants. (02/01/10) CKD    - Renal US: No hydronephrosis (01/28/10)    - CR Trend: (2/20) = 1.55, (2/28) = 1.6, (3/14) = 2.07 - ARB then held, (3/29) = 2.11,(04/04/10) = 1.76    - Followed by Dr. Eliott Nine Pelvic Renae Gloss Prolapse    - With recurrent UTIs    - Patient declines intervention at this time Osteoarthritis    - Shoulders, Hips, Knees, Low Back, Ankles  Family History: Mother - Died at age 77 from Multiple Myeloma Father - Died in 80s from PNA, had Dementia  Review of Systems General:  Denies chills, loss of appetite, sweats, and weight loss. CV:  Denies chest pain or discomfort, palpitations, and shortness of breath  with exertion. Resp:  Denies cough. GI:  Denies change in bowel habits. Derm:  Denies rash. Neuro:  Denies numbness and tingling.  Physical Exam  General:  VS Reviewed. Elderly, well appearing, NAD. Vitals reviewed and WNL.  Lungs:  Normal respiratory effort, chest expands symmetrically. Lungs are clear to auscultation, no crackles or wheezes. Heart:  Normal rate and regular rhythm. S1 and S2 normal without gallop, murmur, click, rub or other extra sounds. Genitalia:  Patient Declines. Pulses:  R and L dorsalis pedis and posterior tibial pulses are full and equal bilaterally. Extremities:  No edema. Skin:  Intact without suspicious lesions or rashes.   Impression & Recommendations:  Problem # 1:  DIABETES MELLITUS, TYPE II, ON INSULIN (ICD-250.00) Assessment Improved  No change today. Will check A1c next month. Her updated medication list for this problem includes:    Lantus Solostar 100 Unit/ml Soln (Insulin glargine) .Marland Kitchen... 13 units in the am    Glipizide 5 Mg Xr24h-tab (Glipizide) ..... One by mouth daily  Orders: [facility]- Est  Level 4 (16109)  Problem # 2:  CHRONIC KIDNEY DISEASE STAGE III (MODERATE) (ICD-585.3) Assessment: Unchanged  Will follow with Dr. Eliott Nine.  Orders: [facility]- Est  Level 4 (60454)  Problem # 3:  HYPERCALCEMIA (ICD-275.42) Assessment: Unchanged  Possible Dx of multiple myeloma. Will follow.  Orders: [facility]- Est  Level 4 (99214)  Problem # 4:  DEEP VENOUS THROMBOPHLEBITIS, LEG, LEFT (ICD-453.40) Assessment: Unchanged  Noted that cardiology may DC Coumadin soon.  Orders: [facility]- Est  Level 4 (99214)  Complete Medication List: 1)  Relion Short Pen Needles 31g X 8 Mm Misc (Insulin pen needle) .... Use as directed qid 2)  Onetouch Test Strp (Glucose blood) .... Per instructions qid 3)  Onetouch Finepoint Lancets Misc (Lancets) .... Per instructions qid 4)  Proventil Hfa 108 (90 Base) Mcg/act Aers (Albuterol sulfate) .... 2 puffs every 4 hours as needed  for wheezing 5)  Amlodipine Besylate 5 Mg Tabs (Amlodipine besylate) .Marland Kitchen.. 1 tab by mouth daily 6)  Metoprolol Tartrate 25 Mg Tabs (Metoprolol tartrate) .Marland Kitchen.. 1 tab by mouth two times a day 7)  Simvastatin 40 Mg Tabs (Simvastatin) 8)  Omeprazole 20 Mg Cpdr (Omeprazole) .... One by mouth daily 9)  Advair Diskus 250-50 Mcg/dose Aepb (Fluticasone-salmeterol) .Marland Kitchen.. 1 inhalation two times a day 10)  Lantus Solostar 100 Unit/ml Soln (Insulin glargine) .Marland Kitchen.. 13 units in the am 11)  Fexofenadine Hcl 180 Mg Tabs (Fexofenadine hcl) .Marland Kitchen.. 1 tab by mouth daily 12)  Glipizide 5 Mg Xr24h-tab (Glipizide) .... One by mouth daily  Patient Instructions: 1)  It was nice to see you today! 2)  Make your next  appointment for July.  Prevention & Chronic Care Immunizations   Influenza vaccine: Not documented   Influenza vaccine due: Not Indicated    Tetanus booster: Not documented    Pneumococcal vaccine: Not documented    H. zoster vaccine: Not documented  Colorectal Screening   Hemoccult: Not documented   Hemoccult action/deferral: Not indicated  (03/26/2010)    Colonoscopy: Not documented  Other Screening   Pap smear: Not documented    Mammogram: Not documented    DXA bone density scan: Not documented   Smoking status: quit  (04/30/2010)  Diabetes Mellitus   HgbA1C: 7.8  (03/26/2010)   HgbA1C action/deferral: Ordered  (03/26/2010)    Eye exam: Not documented    Foot exam: yes  (03/26/2010)   High risk foot: Not documented   Foot care education: Not documented    Urine microalbumin/creatinine ratio: Not documented   Urine microalbumin action/deferral: Not indicated    Diabetes flowsheet reviewed?: Yes   Progress toward A1C goal: Unchanged  Lipids   Total Cholesterol: 130  (01/17/2010)   LDL: 65  (01/17/2010)   LDL Direct: Not documented   HDL: 32  (01/17/2010)   Triglycerides: 167  (01/17/2010)    SGOT (AST): 17  (03/29/2010)   SGPT (ALT): 15  (03/29/2010)   Alkaline  phosphatase: 65  (03/29/2010)   Total bilirubin: 0.3  (03/29/2010)    Lipid flowsheet reviewed?: Yes   Progress toward LDL goal: At goal  Hypertension   Last Blood Pressure: 130 / 70  (04/30/2010)   Serum creatinine: 2.06  (03/29/2010)   Serum potassium 4.5  (03/29/2010)    Hypertension flowsheet reviewed?: Yes   Progress toward BP goal: At goal  Self-Management Support :   Personal Goals (by the next clinic visit) :     Personal A1C goal: 8  (02/19/2010)     Personal blood pressure goal: 140/90  (02/19/2010)     Personal LDL goal: 100  (02/19/2010)    Patient will work on the following items until the next clinic visit to reach self-care goals:     Medications and monitoring: take my medicines every day, bring all of my medications to every visit  (04/30/2010)     Eating: drink diet soda or water instead of juice or soda, eat more vegetables, use fresh or frozen vegetables, eat foods that are low in salt, eat baked foods instead of fried foods, eat fruit for snacks and desserts, limit or avoid alcohol  (04/30/2010)     Activity: take a 30 minute walk every day, take the stairs instead of the elevator, park at the far end of the parking lot  (04/30/2010)    Diabetes self-management support: Written self-care plan  (04/30/2010)   Diabetes care plan printed    Hypertension self-management support: Written self-care plan  (04/30/2010)   Hypertension self-care plan printed.    Lipid self-management support: Written self-care plan  (04/30/2010)   Lipid self-care plan printed.

## 2011-01-08 NOTE — Consult Note (Signed)
Summary: [facility]   [facility]   Imported By: Clydell Hakim 07/06/2010 14:37:19  _____________________________________________________________________  External Attachment:    Type:   Image     Comment:   External Document

## 2011-01-08 NOTE — Consult Note (Signed)
Summary: [facility]  [facility]   Imported By: De Nurse 06/19/2010 16:15:22  _____________________________________________________________________  External Attachment:    Type:   Image     Comment:   External Document

## 2011-01-08 NOTE — Miscellaneous (Signed)
Summary: Home health orders  Clinical Lists Changes In the past two days, I have received two sets of home health orders from [facility].  I have signed both.  On today's orders I included a note that our practice would no longer sign orders unless the patient made an appointment to see [place].  As of now, we have only seen the patient in the hospital and thus she is not a continuity patient of the [facility].  Doralee Albino MD  February 15, 2010 9:59 AM

## 2011-01-08 NOTE — Assessment & Plan Note (Signed)
Summary: New pt / hospital f/u   Vital Signs:  Patient profile:   70 year old female Height:      61 inches Weight:      186.8 pounds BMI:     35.42 Temp:     97.7 degrees F oral Pulse rate:   65 / minute BP sitting:   121 / 70  (left arm) Cuff size:   large  Vitals Entered By: Gladstone Pih (February 19, 2010 1:36 PM) CC: New Patient F/U hospital Is Patient Diabetic? Yes Did you bring your meter with you today? No Pain Assessment Patient in pain? no        Primary Care Provider:  Helane Rima DO  CC:  New Patient F/U hospital.  History of Present Illness: 70yo F new patient to our practice here for hospital f/u  Hospital f/u: Admitted on 01/16/10 and d/c'd on 01/19/2010 w/ dx of weakness, uncontrolled DM, UTI, mild rhabdomyolysis, and ARI.  She states that she left the hospital and went to stay with her daughter to receive outpt therapy when she became confused w/ N/V and elevated blood sugars.  She was then admitted to [facility] hospital on 01/28/10 and d/c'd on 2/28/11with questionable L leg DVT. Pt states that she was never informed of the diagnosis and never had pain but was started on Coumadin.  She has felt great ever since her discharge home.  She is here today to establish care.  DM:  Currently on Lantus 12units in the PM.  No adverse effects.  No hypoglycemic events.    CBGs checked 3 times a day and typically run b/w 110-170s.  Pt followed by an opthalmologist and seen within the past year.  HTN: No adverse effects from medication.  Not checking it regularly.  No dizziness, HA, CP, palpitations, or swelling.  HLD: Tolerating medication.  No adverse effects.  No muscle pain or abd pain.    Habits & Providers  Alcohol-Tobacco-Diet     Tobacco Status: never  Current Medications (verified): 1)  Relion Short Pen Needles 31g X 8 Mm Misc (Insulin Pen Needle) .... Use As Directed Qid 2)  Onetouch Test  Strp (Glucose Blood) .... Per Instructions Qid 3)  Onetouch  Finepoint Lancets  Misc (Lancets) .... Per Instructions Qid 4)  Coumadin 5 Mg Tabs (Warfarin Sodium) .... Take As Directed Per Cardiology 5)  Proventil Hfa 108 (90 Base) Mcg/act Aers (Albuterol Sulfate) .... 2 Puffs Every 4 Hours As Needed For Wheezing 6)  Amlodipine Besylate 5 Mg Tabs (Amlodipine Besylate) .Marland Kitchen.. 1 Tab By Mouth Daily 7)  Losartan Potassium 25 Mg Tabs (Losartan Potassium) .Marland Kitchen.. 1 Tab By Mouth Daily 8)  Metoprolol Tartrate 25 Mg Tabs (Metoprolol Tartrate) .Marland Kitchen.. 1 Tab By Mouth Two Times A Day 9)  Simvastatin 40 Mg Tabs (Simvastatin) 10)  Pantoprazole Sodium 40 Mg Tbec (Pantoprazole Sodium) .Marland Kitchen.. 1 Tab By Mouth Two Times A Day 11)  Advair Diskus 250-50 Mcg/dose Aepb (Fluticasone-Salmeterol) .Marland Kitchen.. 1 Inhalation Two Times A Day 12)  Lantus Solostar 100 Unit/ml Soln (Insulin Glargine) .Marland Kitchen.. 13 Units At Bedtime 13)  Humalog Pen 100 Unit/ml Soln (Insulin Lispro (Human)) .... Follow Prescribed Sliding Scale 14)  Fexofenadine Hcl 180 Mg Tabs (Fexofenadine Hcl) .Marland Kitchen.. 1 Tab By Mouth Daily  Allergies (verified): No Known Drug Allergies  Past History:  Past Medical History: IDDM CAD HTN HLD COPD Hx of CVA  Past Surgical History: Appendectomy Stent placement BTL  Family History: No pertinent FH  Social History: Currently living with  daughter. Divorced Retired.  12th grade education. Former smoker (started 1961, quit 11/2008) Drinks Wine and Liquior once a week No Drug use Exercises 3x/week Cell phone [phone] Status:  never  Review of Systems       No dizziness, HA, CP, palpitations, or swelling.   Physical Exam  General:  VS Reviewed. Elderly, well appearing, NAD.  Neck:  supple, full ROM, no goiter or mass  Lungs:  Normal respiratory effort, chest expands symmetrically. Lungs are clear to auscultation, no crackles or wheezes. Heart:  Normal rate and regular rhythm. S1 and S2 normal without gallop, murmur, click, rub or other extra sounds. Abdomen:  Soft,  NT, ND, no HSM, active BS  Extremities:  no ttp of LEs; no erythema, edema, bruising; symmetric  Neurologic:  no focal deficits   Impression & Recommendations:  Problem # 1:  ? of DVT (ICD-453.40) Assessment New Questionable DVT.  Currently on coumadin Plan to obtain hospital records. Will order LE dopplers to confirm DVT.  Orders: LE Venous Duplex (DVT) (DVT) [facility]- Est  Level 4 (14782)  Problem # 2:  IDDM (ICD-250.01) Assessment: Improved Plan to inc lantus to 13 units and continue current SSI.   Will hold off on oral meds for now although I think it is a good idea to start metformin in the next few weeks after we reassess her renal function. Will have her return in 2 weeks to reassess diabetes and have Dr. Earlene Plater adjust the Lantus and consider adding metformin at that time.  Her updated medication list for this problem includes:    Losartan Potassium 25 Mg Tabs (Losartan potassium) .Marland Kitchen... 1 tab by mouth daily    Lantus Solostar 100 Unit/ml Soln (Insulin glargine) .Marland Kitchen... 13 units at bedtime    Humalog Pen 100 Unit/ml Soln (Insulin lispro (human)) .Marland Kitchen... Follow prescribed sliding scale  Orders: [facility]- Est  Level 4 (95621)  Problem # 3:  ESSENTIAL HYPERTENSION (ICD-401.9) Assessment: Improved At goal (<140/90) No changes to regimen.    Her updated medication list for this problem includes:    Amlodipine Besylate 5 Mg Tabs (Amlodipine besylate) .Marland Kitchen... 1 tab by mouth daily    Losartan Potassium 25 Mg Tabs (Losartan potassium) .Marland Kitchen... 1 tab by mouth daily    Metoprolol Tartrate 25 Mg Tabs (Metoprolol tartrate) .Marland Kitchen... 1 tab by mouth two times a day  Orders: [facility]- Est  Level 4 (30865)  Problem # 4:  HYPERLIPIDEMIA (ICD-272.4) Assessment: Unchanged No changes to regimen.  Her updated medication list for this problem includes:    Simvastatin 40 Mg Tabs (Simvastatin)  Orders: Comp Met-[facility] ([id number]) [facility]- Est  Level 4 (84132)  Problem # 5:  COPD (ICD-496) Assessment:  Unchanged Pt sounds clear and in no respiratory distress. Cont on current regimen.  Her updated medication list for this problem includes:    Proventil Hfa 108 (90 Base) Mcg/act Aers (Albuterol sulfate) .Marland Kitchen... 2 puffs every 4 hours as needed for wheezing    Advair Diskus 250-50 Mcg/dose Aepb (Fluticasone-salmeterol) .Marland Kitchen... 1 inhalation two times a day  Orders: [facility]- Est  Level 4 (44010)  Problem # 6:  Preventive Health Care (ICD-V70.0) Assessment: Comment Only Will need to obtain outside records to determine previous mammograms.  DEXA???  Complete Medication List: 1)  Relion Short Pen Needles 31g X 8 Mm Misc (Insulin pen needle) .... Use as directed qid 2)  Onetouch Test Strp (Glucose blood) .... Per instructions qid 3)  Onetouch Finepoint Lancets Misc (Lancets) .... Per instructions qid 4)  Coumadin 5 Mg Tabs (Warfarin sodium) .... Take as directed per cardiology 5)  Proventil Hfa 108 (90 Base) Mcg/act Aers (Albuterol sulfate) .... 2 puffs every 4 hours as needed for wheezing 6)  Amlodipine Besylate 5 Mg Tabs (Amlodipine besylate) .Marland Kitchen.. 1 tab by mouth daily 7)  Losartan Potassium 25 Mg Tabs (Losartan potassium) .Marland Kitchen.. 1 tab by mouth daily 8)  Metoprolol Tartrate 25 Mg Tabs (Metoprolol tartrate) .Marland Kitchen.. 1 tab by mouth two times a day 9)  Simvastatin 40 Mg Tabs (Simvastatin) 10)  Pantoprazole Sodium 40 Mg Tbec (Pantoprazole sodium) .Marland Kitchen.. 1 tab by mouth two times a day 11)  Advair Diskus 250-50 Mcg/dose Aepb (Fluticasone-salmeterol) .Marland Kitchen.. 1 inhalation two times a day 12)  Lantus Solostar 100 Unit/ml Soln (Insulin glargine) .Marland Kitchen.. 13 units at bedtime 13)  Humalog Pen 100 Unit/ml Soln (Insulin lispro (human)) .... Follow prescribed sliding scale 14)  Fexofenadine Hcl 180 Mg Tabs (Fexofenadine hcl) .Marland Kitchen.. 1 tab by mouth daily  Patient Instructions: 1)  Please schedule a follow-up appointment in 2 weeks with Dr. Earlene Plater. 2)  Today we increased the Lantus to 13 units a day. 3)  No changes to the  medications. 4)  We'll recheck the A1c in May. 5)  I'm going to get you checked for possible DVTs. Prescriptions: FEXOFENADINE HCL 180 MG TABS (FEXOFENADINE HCL) 1 tab by mouth daily  #90 x 1   Entered and Authorized by:   Marisue Ivan  MD   Signed by:   Marisue Ivan  MD on 02/19/2010   Method used:   Electronically to        [facility] [phone]* (retail)       [address]       [place], [place]  [address]       Ph: [phone] or [phone]       Fax: [phone]   RxID:   [id number]    Prevention & Chronic Care Immunizations   Influenza vaccine: Not documented   Influenza vaccine due: Not Indicated    Tetanus booster: Not documented    Pneumococcal vaccine: Not documented    H. zoster vaccine: Not documented  Colorectal Screening   Hemoccult: Not documented    Colonoscopy: Not documented  Other Screening   Pap smear: Not documented    Mammogram: Not documented    DXA bone density scan: Not documented   Smoking status: never  (02/19/2010)  Diabetes Mellitus   HgbA1C: Not documented    Eye exam: Not documented    Foot exam: Not documented   High risk foot: Not documented   Foot care education: Not documented    Urine microalbumin/creatinine ratio: Not documented    Diabetes flowsheet reviewed?: Yes   Progress toward A1C goal: Unchanged  Lipids   Total Cholesterol: Not documented   LDL: Not documented   LDL Direct: Not documented   HDL: Not documented   Triglycerides: Not documented    SGOT (AST): Not documented   SGPT (ALT): Not documented CMP ordered    Alkaline phosphatase: Not documented   Total bilirubin: Not documented    Lipid flowsheet reviewed?: Yes   Progress toward LDL goal: Unchanged  Hypertension   Last Blood Pressure: 121 / 70  (02/19/2010)   Serum creatinine: Not documented   Serum potassium Not documented CMP ordered     Hypertension flowsheet reviewed?: Yes   Progress toward BP goal: At  goal  Self-Management Support :   Personal Goals (by the next clinic visit) :  Personal A1C goal: 8  (02/19/2010)     Personal blood pressure goal: 140/90  (02/19/2010)     Personal LDL goal: 100  (02/19/2010)    Patient will work on the following items until the next clinic visit to reach self-care goals:     Medications and monitoring: take my medicines every day, check my blood sugar, check my blood pressure  (02/19/2010)     Eating: drink diet soda or water instead of juice or soda, eat more vegetables, use fresh or frozen vegetables, eat foods that are low in salt, eat baked foods instead of fried foods, eat fruit for snacks and desserts, limit or avoid alcohol  (02/19/2010)    Diabetes self-management support: CBG self-monitoring log, Written self-care plan, Education handout  (02/19/2010)   Diabetes care plan printed   Diabetes education handout printed    Hypertension self-management support: CBG self-monitoring log, Written self-care plan  (02/19/2010)   Hypertension self-care plan printed.    Lipid self-management support: Written self-care plan  (02/19/2010)   Lipid self-care plan printed.   Flu Vaccine Next Due:  Not Indicated

## 2011-01-08 NOTE — Assessment & Plan Note (Signed)
Summary: f/up,tcb   Vital Signs:  Patient profile:   70 year old female Height:      61 inches Weight:      198 pounds BMI:     37.55 Temp:     97.4 degrees F oral Pulse rate:   67 / minute BP sitting:   150 / 70  (left arm)  Vitals Entered By: Jimmy Footman, CMA (July 02, 2010 2:55 PM) CC: f/u diabetes, HTN, CKD, MGUS Is Patient Diabetic? Yes Did you bring your meter with you today? Yes Pain Assessment Patient in pain? no        Primary Care Provider:  Helane Rima DO  CC:  f/u diabetes, HTN, CKD, and MGUS.  History of Present Illness: 70 yo F:  1. DM:  Lantus 13 units each am, Glipizide with dinner (largest meal). Checks CBG 1-2 times daily. Lowest BG 116 with no sxs, highest was 278.  Usually, it is 90 - 120s.  Pt followed by an opthalmologist and seen within the past year. Patient has been participating in DM education. She admits that she has not been following her diet recently and that it was due to anxiety over her possible Dx of multiple myeloma.  2. HTN: Rx Metoprolol, Amlodipine. No CP, SOB, LE edema.  3. CKD: Stage 3-4. Recently seen by Dr. Eliott Nine. See scanned consult report for full details.   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  Was told that she has a higher chance of developing Multiple Myeloma in the future. This has caused extreme anxiety for her.  Habits & Providers  Alcohol-Tobacco-Diet     Tobacco Status: quit  Allergies (verified): No Known Drug Allergies  Past History:  Past Medical History: DM    - Insulin-Requiring    - Dx 2011    - Reported DM changes by Optho 10/2009 CAD    - Followed by Dr. Eldridge Dace    - Cough with ACE    - Cardiolyte: No evidence of ischemia or infarction. Normal LV systolic function with EF69%. (01/29/10) Diastolic CHF    - Echo: EF > 55%. Mod concentric LVH. Min aortic sclerosis. Mild TR. Mod pulmonary HTN. Diastolic Dysfxn. HTN HLD COPD Hx of Silent CVA Left Leg DVT (01/2010)    - Acute DVT involving the peroneal  and posterior tibial trunks of left calf. (01/30/10)    - Negative LE Doppler (02/21/10)    - Rx Coumadin, stopped 12/08/08 Esophagitis    - EGD: LA Grade D reflux esophagitis. Gastritis without hemorrhage. Dudenitis without hemorrhage. Recs: PPI two times a day. Repeat EGD once off anticoagulants. (02/01/10) CKD    - Renal US: No hydronephrosis (01/28/10)    - CR Trend: (2/20) = 1.55, (2/28) = 1.6, (3/14) = 2.07 - ARB then held, (3/29) = 2.11,(04/04/10) = 1.76    - Followed by Dr. Eliott Nine Pelvic Renae Gloss Prolapse    - With recurrent UTIs    - Patient declines intervention at this time Osteoarthritis    - Shoulders, Hips, Knees, Low Back, Ankles MGUS    - Followed by Heme-Onc    - Has increased risk of developing multple myeloma PMH-FH-SH reviewed for relevance  Review of Systems General:  Denies chills and fever. CV:  Denies chest pain or discomfort, palpitations, and shortness of breath with exertion. GI:  Denies change in bowel habits. Derm:  Denies rash. Neuro:  Denies numbness and tingling. Psych:  Complains of anxiety and depression; denies suicidal thoughts/plans and thoughts /plans of harming  others.  Physical Exam  General:  VS Reviewed. Elderly, well appearing, NAD. Vitals reviewed and WNL.  Lungs:  Normal respiratory effort, chest expands symmetrically. Lungs are clear to auscultation, no crackles or wheezes. Heart:  Normal rate and regular rhythm. S1 and S2 normal without gallop, murmur, click, rub or other extra sounds. Pulses:  R and L dorsalis pedis and posterior tibial pulses are full and equal bilaterally. Extremities:  1+ left pedal edema and 1+ right pedal edema.   Psych:  Oriented X3, memory intact for recent and remote, good eye contact, depressed affect, and tearful.     Impression & Recommendations:  Problem # 1:  DIABETES MELLITUS, TYPE II, ON INSULIN (ICD-250.00) Assessment Unchanged  Discussed self-adjustment of insulin regimen. Will f/u in 3-4 weeks.  Her  updated medication list for this problem includes:    Lantus Solostar 100 Unit/ml Soln (Insulin glargine) .Marland Kitchen... 13 units in the am    Glipizide 5 Mg Xr24h-tab (Glipizide) ..... One by mouth daily    Aspirin 325 Mg Tabs (Aspirin) .Marland Kitchen... Take 1 tab by mouth every day  Orders: [facility]- Est  Level 4 (78295)  Problem # 2:  ESSENTIAL HYPERTENSION (ICD-401.9) Assessment: Unchanged  Increased Norvasc to 10 mg by mouth daily. Her updated medication list for this problem includes:    Norvasc 10 Mg Tabs (Amlodipine besylate) ..... One by mouth daily    Metoprolol Tartrate 25 Mg Tabs (Metoprolol tartrate) .Marland Kitchen... 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 Use as directed qid 2)  Onetouch Test Strp (Glucose blood) .... Per instructions qid 3)  Onetouch Finepoint Lancets Misc (Lancets) .... Per instructions qid 4)  Proventil Hfa 108 (90 Base) Mcg/act Aers (Albuterol sulfate) .... 2 puffs every 4 hours as needed for wheezing 5)  Norvasc 10 Mg Tabs (Amlodipine besylate) .... One by mouth daily 6)  Metoprolol Tartrate 25 Mg Tabs (Metoprolol tartrate) .Marland Kitchen.. 1 tab by mouth two times a day 7)  Simvastatin 40 Mg Tabs (Simvastatin) 8)  Omeprazole 20 Mg Cpdr (Omeprazole) .... One by mouth daily 9)  Advair Diskus 250-50 Mcg/dose Aepb (Fluticasone-salmeterol) .Marland Kitchen.. 1 inhalation two times a day 10)  Lantus Solostar 100 Unit/ml Soln (Insulin glargine) .Marland Kitchen.. 13 units in the am 11)  Fexofenadine Hcl 180 Mg Tabs (Fexofenadine hcl) .Marland Kitchen.. 1 tab by mouth daily 12)  Glipizide 5 Mg Xr24h-tab (Glipizide) .... One by mouth daily 13)  Aspirin 325 Mg Tabs (Aspirin) ....  Take 1 tab by mouth every day 14)  Allegra 180 Mg Tabs (Fexofenadine hcl) .... One by mouth daily 15)  Alendronate Sodium 70 Mg Tabs (Alendronate sodium) .Marland Kitchen.. 1 by mouth q week . take with 8 ounces of water on and empty stomach 16)  Nitrostat 0.4 Mg Subl (Nitroglycerin) .... At first sign of chest pain, q 5 min, up to 3 doses  Patient Instructions: 1)  Increase your Amlodipine to 2 pills (10 mg) by mouth daily. 2)  Increase your Lantus by 1 unit daily as long as am fasting BG is > 140. Prescriptions: ALENDRONATE SODIUM 70 MG  TABS (ALENDRONATE SODIUM) 1 by mouth q  week . Take with 8 ounces of water on and empty stomach  #12 x 3   Entered and Authorized by:   Helane Rima DO   Signed by:   Helane Rima DO on 07/09/2010   Method used:   Historical   RxID:   [id number] NORVASC 10 MG TABS (AMLODIPINE BESYLATE) one by mouth daily  #30 x 0   Entered and Authorized by:   Helane Rima DO   Signed by:   Helane Rima DO on 07/02/2010   Method used:   Historical   RxID:   [id number]   Prevention & Chronic Care Immunizations   Influenza vaccine: Not documented   Influenza vaccine due: Not Indicated    Tetanus booster: Not documented    Pneumococcal vaccine: Not documented    H. zoster vaccine: Not documented  Colorectal Screening   Hemoccult: Not documented   Hemoccult action/deferral: Not indicated  (03/26/2010)    Colonoscopy: Not documented  Other Screening   Pap smear: Not documented    Mammogram: Not documented    DXA bone density scan: Not documented   Smoking status: quit  (07/02/2010)  Diabetes Mellitus   HgbA1C: 7.8  (03/26/2010)   HgbA1C action/deferral: Ordered  (03/26/2010)    Eye exam: Not documented    Foot exam: yes  (03/26/2010)   High risk foot: Not documented   Foot care education: Not documented    Urine microalbumin/creatinine ratio: Not documented   Urine microalbumin action/deferral: Not indicated    Diabetes flowsheet reviewed?:  Yes   Progress toward A1C goal: Unchanged  Lipids   Total Cholesterol: 130  (01/17/2010)   LDL: 65  (01/17/2010)   LDL Direct: Not documented   HDL: 32  (01/17/2010)   Triglycerides: 167  (01/17/2010)    SGOT (AST): 17  (03/29/2010)   SGPT (ALT): 15  (03/29/2010)   Alkaline phosphatase: 65  (03/29/2010)   Total bilirubin: 0.3  (03/29/2010)    Lipid flowsheet reviewed?: Yes   Progress toward LDL goal: Unchanged  Hypertension   Last Blood Pressure: 150 / 70  (07/02/2010)   Serum creatinine: 2.06  (03/29/2010)   Serum potassium 4.5  (03/29/2010)    Hypertension flowsheet reviewed?: Yes   Progress toward BP goal: Deteriorated  Self-Management Support :   Personal Goals (by the next clinic visit) :     Personal A1C goal: 8  (02/19/2010)     Personal blood pressure goal: 140/90  (02/19/2010)     Personal LDL goal: 100  (02/19/2010)    Patient will work on the following items until the next clinic visit to reach self-care goals:     Medications and monitoring: take my medicines every day, bring all of my medications to every visit  (07/02/2010)     Eating: drink diet soda or water instead of juice or soda, eat more vegetables, use fresh or frozen vegetables, eat foods that are low in salt, eat baked foods instead of fried foods, eat fruit for snacks and desserts, limit or avoid alcohol  (07/02/2010)     Activity: take a 30 minute walk every day, take the stairs instead of the elevator, park at the far end of the parking lot  (07/02/2010)    Diabetes self-management support: Written self-care plan  (07/02/2010)   Diabetes care plan printed    Hypertension self-management support: Written self-care plan  (07/02/2010)   Hypertension self-care plan printed.    Lipid self-management support: Written self-care plan  (07/02/2010)   Lipid self-care  plan printed.

## 2011-01-08 NOTE — Miscellaneous (Signed)
Summary: Hypercalcemia  Clinical Lists Changes  Problems: Added new problem of HYPERCALCEMIA (ICD-275.42) - Signed Orders: Added new Test order of Comp Met-[facility] [phone]) - Signed Added new Test order of Miscellaneous Lab Charge-[facility] [phone]) - Signed Observations: Added new observation of CREATININE: 1.90 mg/dL (78/46/9629 52:84) Added new observation of CALCIUM: 10.7 mg/dL (13/24/4010 27:25) Added new observation of ALBUMIN: 3.2 g/dL (36/64/4034 74:25)   Patient with 2 recent elevated calcium values - the last correct calcium = 11.6. Ordering intact PTH and recheck CMP. Please call patient to inform her of necessary test.  CALLED PT AND INFORMED OF ABOVE. PT WILL HAVE BLOOD WORK TODAY. DISCUSSED WITH BONNIE.Marland KitchenArlyss Repress CMA,  March 29, 2010 10:26 AM

## 2011-01-08 NOTE — Progress Notes (Signed)
Summary: meds prob  Phone Note Call from Patient Call back at [phone]   Caller: other Relative- Kristi [name] Summary of Call: released from hospital on Friday and has a HFU w/ [place] on 2/21 states that she doesn't have lancets, strips & needles for her One Touch meter [facility] [address] [facility] [place] - [phone]  also given rx Pephalexin and has question about dosage -  Initial call taken by: De Nurse,  January 22, 2010 1:45 PM  Follow-up for Phone Call        spoke with Kristi [name]. she is a newly diagnosed diabetic. the starter pack does not have needed items.  also the antibiotic was to be 2 pills per Kristi [name]. the pharmacist gave a bottle of 30. told her I will forward this to Dr. Burnadette Pop & will call her back when he responds Follow-up by: Golden Circle RN,  January 22, 2010 2:12 PM  Additional Follow-up for Phone Call Additional follow up Details #1::        Please fax the Rxs (diabetic supplies) to the patient's pharmacy. It was not on the pharmacy list. The patient was instructed to take Cephalexin 500 mg by mouth two times a day x 2 days, so she only needed 4 pills. Additional Follow-up by: Helane Rima DO,  January 22, 2010 7:44 PM    Additional Follow-up for Phone Call Additional follow up Details #2::    called & left rx on voice mail. called Kristi [name] & told her this had been done. she got a bottle of #30 cephalexin. she sees that it is listed as 2 x2 days. told her to take the bottle of 30 back to pharmacy so they can correct their error. told her if sh has any other issues to call me Follow-up by: Golden Circle RN,  January 23, 2010 8:39 AM  Additional Follow-up for Phone Call Additional follow up Details #3:: Details for Additional Follow-up Action Taken: pharmacist from [facility] called. stated pt gets all meds in [place]. told her her relative [facility] [place]. states the rx was for two times a day x 15 days! (cefalexin) Additional  Follow-up by: Golden Circle RN,  January 23, 2010 9:39 AM  New/Updated Medications: RELION SHORT PEN NEEDLES 31G X 8 MM MISC (INSULIN PEN NEEDLE) use as directed QID ONETOUCH TEST  STRP (GLUCOSE BLOOD) per instructions QID ONETOUCH FINEPOINT LANCETS  MISC (LANCETS) per instructions QID Prescriptions: ONETOUCH FINEPOINT LANCETS  MISC (LANCETS) per instructions QID  #120 x 3   Entered by:   Helane Rima DO   Authorized by:   Marisue Ivan  MD   Signed by:   Helane Rima DO on 01/22/2010   Method used:   Print then Give to Patient   RxID:   [id number] ONETOUCH TEST  STRP (GLUCOSE BLOOD) per instructions QID  #120 x 3   Entered by:   Helane Rima DO   Authorized by:   Marisue Ivan  MD   Signed by:   Helane Rima DO on 01/22/2010   Method used:   Print then Give to Patient   RxID:   [id number] RELION SHORT PEN NEEDLES 31G X 8 MM MISC (INSULIN PEN NEEDLE) use as directed QID  #120 x 3   Entered by:   Helane Rima DO   Authorized by:   Marisue Ivan  MD   Signed by:   Helane Rima DO on 01/22/2010   Method used:   Print then Give to Patient  RxID:   [id number]

## 2011-01-08 NOTE — Miscellaneous (Signed)
Summary: Warfarin  Clinical Lists Changes Ms. Bamford said warfarin-prescribing physician, Dr. Eldridge Dace, may take her off the warfarin at her next appt with him.  If this occurs, I suggested we will talk about increasing veg's in her diet.  Meanwhile, she seems to have a fairly good understanding of vitamin K sources, and I emphasized the need for consistency of intake.

## 2011-01-08 NOTE — Letter (Signed)
Summary: Regarding future home health orders  [facility]  [address]   [place], [place] [address]   Phone: [phone]  Fax: [phone]    02/15/2010  Kristi [name] [address] [place], [place]  [address]  Dear [facility],   Ms Riding was seen in the hospital by me.  She was supposed to follow up with our outpatient practice.  As of now, she has not made any follow up appointments.  Thus, she is not currently our patient.   This is the last order set I will sign for her unless she makes a follow up appointment.  Thank you for your understanding.  Sincerely,      Doralee Albino MD

## 2011-01-10 NOTE — Progress Notes (Signed)
Summary: UPDATE PROBLEM LIST   

## 2011-01-11 ENCOUNTER — Ambulatory Visit
Admission: RE | Admit: 2011-01-11 | Discharge: 2011-01-11 | Disposition: A | Discharge status: Home / Self Care | Payer: No Typology Code available for payment source | Source: Ambulatory Visit | Attending: Family Medicine | Admitting: Family Medicine

## 2011-01-11 ENCOUNTER — Other Ambulatory Visit: Payer: Self-pay | Admitting: Family Medicine

## 2011-01-11 ENCOUNTER — Encounter: Payer: Self-pay | Admitting: Family Medicine

## 2011-01-11 ENCOUNTER — Ambulatory Visit (INDEPENDENT_AMBULATORY_CARE_PROVIDER_SITE_OTHER): Payer: No Typology Code available for payment source | Admitting: Family Medicine

## 2011-01-11 DIAGNOSIS — R0781 Pleurodynia: Secondary | ICD-10-CM

## 2011-01-11 DIAGNOSIS — R079 Chest pain, unspecified: Secondary | ICD-10-CM | POA: Insufficient documentation

## 2011-01-11 DIAGNOSIS — Z87891 Personal history of nicotine dependence: Secondary | ICD-10-CM | POA: Insufficient documentation

## 2011-01-14 ENCOUNTER — Encounter: Payer: Self-pay | Admitting: Family Medicine

## 2011-01-15 ENCOUNTER — Telehealth: Payer: Self-pay | Admitting: *Deleted

## 2011-01-18 ENCOUNTER — Encounter: Payer: Self-pay | Admitting: *Deleted

## 2011-01-18 ENCOUNTER — Other Ambulatory Visit: Payer: No Typology Code available for payment source

## 2011-01-18 DIAGNOSIS — E785 Hyperlipidemia, unspecified: Secondary | ICD-10-CM

## 2011-01-18 NOTE — Progress Notes (Signed)
Discussed with pt, she has enough Lantus- 2 more pens at this time. PA completed and notes sent Pt was notified that if this is denied she will have to switch to Lantus Vials

## 2011-01-18 NOTE — Progress Notes (Unsigned)
PA required for lantus solostar. Form for [facility] placed in MD box.

## 2011-01-18 NOTE — Progress Notes (Deleted)
  Subjective:    Patient ID: Kristi [name], female    DOB: December 28, 1940, 70 y.o.   MRN: [id number]  HPI    Review of Systems     Objective:   Physical Exam        Assessment & Plan:

## 2011-01-19 LAB — LIPID PANEL
Cholesterol: 179 mg/dL (ref 0–200)
LDL Cholesterol: 117 mg/dL — ABNORMAL HIGH (ref 0–99)

## 2011-01-23 NOTE — Progress Notes (Signed)
She is taking the med. Told her you would be changing it. [facility] [address]

## 2011-01-24 ENCOUNTER — Other Ambulatory Visit: Payer: Self-pay | Admitting: Family Medicine

## 2011-01-24 NOTE — Telephone Encounter (Signed)
Refill request

## 2011-01-24 NOTE — Letter (Signed)
Summary: Generic Letter  [facility]  [address]   [place], [place] [address]   Phone: [phone]  Fax: [phone]    01/14/2011  EDEN TOOHEY [address] [place], [place]  [address]  [place]  Dear [name],  I am happy to inform you that your rib xray didn't show any fractures or other concerning signs.   Make sure to come back to the office one morning (while fasting), so that we can check your cholesterol.  Sincerely,   Helane Rima DO

## 2011-01-24 NOTE — Assessment & Plan Note (Signed)
Summary: f/u eo   Vital Signs:  Patient profile:   70 year old female Height:      61 inches Weight:      196 pounds BMI:     37.17 Temp:     97.8 degrees F oral Pulse rate:   71 / minute BP sitting:   155 / 77  (right arm) Cuff size:   regular  Vitals Entered By: Tessie Fass CMA (January 11, 2011 9:04 AM)  Serial Vital Signs/Assessments:  Time      Position  BP       Pulse  Resp  Temp     By                     138/76                         Helane Rima DO  CC: F/U DM Is Patient Diabetic? Yes Pain Assessment Patient in pain? yes     Location: right side Intensity: 2   Primary Care Provider:  Helane Rima DO  CC:  F/U DM.  History of Present Illness: 70 yo F:  1. DM:  Lantus 13 units each am, Glipizide with dinner (largest meal). Checks CBG 1-2 times daily. Usually, it is 90 - 120s.  Pt followed by an opthalmologist and seen within the past year - has cataracts. Patient has participated in DM education. She admits that she has not been following her diet recently and that it was due to anxiety over her possible Dx of multiple myeloma. Last A1c = 6.6 at [facility].  2. HTN: Rx Metoprolol, Amlodipine. No CP, SOB, LE edema.  3. CKD: Stage 3-4. Recently seen by Dr. Eliott Nine. See scanned consult report for full details.   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  Was told that she has a higher chance of developing Multiple Myeloma in the future. This has caused extreme anxiety for her.  5. Tobacco: Patient admits that she has started smoking again. 1/2 ppd. Hx COPD.  6. Rib Pain: x several weeks. No known injury. No RUQ pain.   7. HLD: Rx Simvastatin. Denies myalgias.  Current Medications (verified): 1)  Bd Pen Needle Short U/f 31g X 8 Mm Misc (Insulin Pen Needle) .... Test Blood Sugard Four Times A Day 2)  Onetouch Test  Strp (Glucose Blood) .... Per Instructions Qid 3)  Onetouch Finepoint Lancets  Misc (Lancets) .... Per Instructions Qid 4)  Proventil Hfa 108 (90 Base) Mcg/act  Aers (Albuterol Sulfate) .... 2 Puffs Every 4 Hours As Needed For Wheezing 5)  Norvasc 10 Mg Tabs (Amlodipine Besylate) .... One By Mouth Daily 6)  Metoprolol Tartrate 25 Mg Tabs (Metoprolol Tartrate) .Marland Kitchen.. 1 Tab By Mouth Two Times A Day 7)  Simvastatin 40 Mg Tabs (Simvastatin) 8)  Omeprazole 20 Mg Cpdr (Omeprazole) .... One By Mouth Daily 9)  Advair Diskus 250-50 Mcg/dose Aepb (Fluticasone-Salmeterol) .Marland Kitchen.. 1 Inhalation Two Times A Day 10)  Lantus Solostar 100 Unit/ml Soln (Insulin Glargine) .Marland Kitchen.. 13 Units in The Am 11)  Fexofenadine Hcl 180 Mg Tabs (Fexofenadine Hcl) .Marland Kitchen.. 1 Tab By Mouth Daily 12)  Glipizide 5 Mg Xr24h-Tab (Glipizide) .... One By Mouth Daily 13)  Aspirin 325 Mg Tabs (Aspirin) .... Take 1 Tab By Mouth Every Day 14)  Allegra 180 Mg Tabs (Fexofenadine Hcl) .... One By Mouth Daily 15)  Alendronate Sodium 70 Mg  Tabs (Alendronate Sodium) .Marland Kitchen.. 1 By Mouth Q Week .  Take With 8 Ounces of Water On and Empty Stomach 16)  Nitrostat 0.4 Mg Subl (Nitroglycerin) .... At First Sign of Chest Pain, Q 5 Min, Up To 3 Doses  Allergies (verified): No Known Drug Allergies  Past History:  Past Surgical History: Last updated: 02/19/2010 Appendectomy Stent placement BTL  Family History: Last updated: May 29, 2010 Mother - Died at age 50 from Multiple Myeloma Father - Died in 72s from PNA, had Dementia  Social History: Last updated: 03/08/2010 Currently living with daughter. Divorced Retired.  12th grade education. Former smoker (started 1961, quit 11/2008). Drinks Wine and Liquior once a week. No Drug use. Exercises 3x/week. Cell phone [phone].  Risk Factors: Smoking Status: quit (07/02/2010)  Past Medical History: DM    - Insulin-Requiring    - Dx 2011    - Reported DM changes by Optho 10/2009 CAD    - Followed by Dr. Eldridge Dace    - Cough with ACE    - Cardiolyte: No evidence of ischemia or infarction. Normal LV systolic function with EF69%. (01/29/10) Diastolic CHF    - Echo:  EF > 56%. Mod concentric LVH. Min aortic sclerosis. Mild TR. Mod pulmonary HTN. Diastolic Dysfxn. HTN HLD COPD Hx of Silent CVA Left Leg DVT (01/2010)    - Acute DVT involving the peroneal and posterior tibial trunks of left calf. (01/30/10)    - Negative LE Doppler (02/21/10)    - Rx Coumadin, stopped 12/08/08 Esophagitis    - EGD: LA Grade D reflux esophagitis. Gastritis without hemorrhage. Dudenitis without hemorrhage. Recs: PPI two times a day. Repeat EGD once off anticoagulants. (02/01/10) CKD    - Renal US: No hydronephrosis (01/28/10)    - NO BP IN LEFT ARM - SAVING FOR AVG    - 2HPTH    - Followed by Dr. Eliott Nine Pelvic Renae Gloss Prolapse    - With recurrent UTIs    - Patient declines intervention at this time Osteoarthritis    - Shoulders, Hips, Knees, Low Back, Ankles MGUS    - Followed by Heme-Onc    - Has increased risk of developing multple myeloma PMH-FH-SH reviewed for relevance  Review of Systems      See HPI  Physical Exam  General:  VS Reviewed. Elderly, well appearing, NAD. Vitals reviewed and WNL.  Chest Wall:  TTP along lower right ribs. Lungs:  Normal respiratory effort, chest expands symmetrically. Lungs are clear to auscultation, no crackles or wheezes. Heart:  Normal rate and regular rhythm. S1 and S2 normal without gallop, murmur, click, rub or other extra sounds. Abdomen:  Bowel sounds positive,abdomen soft and non-tender without masses, organomegaly or hernias noted. Pulses:  R and L dorsalis pedis and posterior tibial pulses are full and equal bilaterally. Extremities:  1+ left pedal edema and 1+ right pedal edema.     Impression & Recommendations:  Problem # 1:  DIABETES MELLITUS, TYPE II, ON INSULIN (ICD-250.00) Assessment Unchanged A2c controlled. Her updated medication list for this problem includes:    Lantus Solostar 100 Unit/ml Soln (Insulin glargine) .Marland Kitchen... 13 units in the am    Glipizide 5 Mg Xr24h-tab (Glipizide) ..... One by mouth daily     Aspirin 325 Mg Tabs (Aspirin) .Marland Kitchen... Take 1 tab by mouth every day  Orders: [facility]- Est  Level 4 (21308)  Problem # 2:  ESSENTIAL HYPERTENSION (ICD-401.9) Assessment: Unchanged Patient instructed to monitor BP at home until next visit. May adjust regimen at next visit. Her updated medication list for this problem includes:  Norvasc 10 Mg Tabs (Amlodipine besylate) ..... One by mouth daily    Metoprolol Tartrate 25 Mg Tabs (Metoprolol tartrate) .Marland Kitchen... 1 tab by mouth two times a day  Orders: [facility]- Est  Level 4 (99214)  Problem # 3:  CHRONIC KIDNEY DISEASE STAGE III (MODERATE) (ICD-585.3) Assessment: Unchanged Followed by Dr. Eliott Nine. Orders: [facility]- Est  Level 4 (88416)  Problem # 4:  TOBACCO ABUSE (ICD-305.1) Assessment: New Discussed Wellbutrin. Will await labs done at [facility] last week before possibly initiating medication. Orders: [facility]- Est  Level 4 (99214)  Problem # 5:  RIB PAIN, RIGHT SIDED (ICD-786.50) Assessment: New No red flags. Possible rib fracture vs pathologic lesion vs MSK pain. Rib series ordered. Orders: Radiology other (Radiology Other) [facility]- Est  Level 4 (60630)  Problem # 6:  HYPERLIPIDEMIA (ICD-272.4) Assessment: Unchanged  Her updated medication list for this problem includes:    Simvastatin 40 Mg Tabs (Simvastatin)  Future Orders: Lipid-[facility] (16010-93235) ... 01/10/2012  Complete Medication List: 1)  Bd Pen Needle Short U/f 31g X 8 Mm Misc (Insulin pen needle) .... Test blood sugard four times a day 2)  Onetouch Test Strp (Glucose blood) .... Per instructions qid 3)  Onetouch Finepoint Lancets Misc (Lancets) .... Per instructions qid 4)  Proventil Hfa 108 (90 Base) Mcg/act Aers (Albuterol sulfate) .... 2 puffs every 4 hours as needed for wheezing 5)  Norvasc 10 Mg Tabs (Amlodipine besylate) .... One by mouth daily 6)  Metoprolol Tartrate 25 Mg Tabs (Metoprolol tartrate) .Marland Kitchen.. 1 tab by mouth two times a day 7)  Simvastatin 40 Mg Tabs (Simvastatin) 8)   Omeprazole 20 Mg Cpdr (Omeprazole) .... One by mouth daily 9)  Advair Diskus 250-50 Mcg/dose Aepb (Fluticasone-salmeterol) .Marland Kitchen.. 1 inhalation two times a day 10)  Lantus Solostar 100 Unit/ml Soln (Insulin glargine) .Marland Kitchen.. 13 units in the am 11)  Fexofenadine Hcl 180 Mg Tabs (Fexofenadine hcl) .Marland Kitchen.. 1 tab by mouth daily 12)  Glipizide 5 Mg Xr24h-tab (Glipizide) .... One by mouth daily 13)  Aspirin 325 Mg Tabs (Aspirin) .... Take 1 tab by mouth every day 14)  Allegra 180 Mg Tabs (Fexofenadine hcl) .... One by mouth daily 15)  Alendronate Sodium 70 Mg Tabs (Alendronate sodium) .Marland Kitchen.. 1 by mouth q week . take with 8 ounces of water on and empty stomach 16)  Nitrostat 0.4 Mg Subl (Nitroglycerin) .... At first sign of chest pain, q 5 min, up to 3 doses  Patient Instructions: 1)  It was nice to see you today. 2)  We are getting an xray of your ribs. 3)  We are requesting results from Dr. Eliott Nine and will call about the Wellbutrin. Prescriptions: LANTUS SOLOSTAR 100 UNIT/ML SOLN (INSULIN GLARGINE) 13 units in the am  #1 x 11   Entered and Authorized by:   Helane Rima DO   Signed by:   Helane Rima DO on 01/14/2011   Method used:   Electronically to        [facility] [phone]* (retail)       [address] [place]  [address]       Ph: [phone] or [phone]       Fax: [phone]   RxID:   [id number]    Orders Added: 1)  Radiology other [Radiology Other] 2)  Lipid-[facility] [[id number]] 3)  [facility]- Est  Level 4 [99371]    Prevention & Chronic Care Immunizations   Influenza vaccine: Not documented   Influenza  vaccine due: Not Indicated    Tetanus booster: Not documented    Pneumococcal vaccine: Not documented    H. zoster vaccine: Not documented  Colorectal Screening   Hemoccult: Not documented   Hemoccult action/deferral: Not indicated  (03/26/2010)    Colonoscopy: Not documented  Other Screening   Pap smear: Not documented    Mammogram: Not documented     DXA bone density scan: Not documented   Smoking status: quit  (07/02/2010)  Diabetes Mellitus   HgbA1C: 7.8  (03/26/2010)   HgbA1C action/deferral: Ordered  (03/26/2010)    Eye exam: Not documented    Foot exam: yes  (03/26/2010)   High risk foot: Not documented   Foot care education: Not documented    Urine microalbumin/creatinine ratio: Not documented   Urine microalbumin action/deferral: Not indicated    Diabetes flowsheet reviewed?: Yes   Progress toward A1C goal: At goal   Diabetes comments: A1c = 6.6 at Dr. Elza Rafter office.  Lipids   Total Cholesterol: 130  (01/17/2010)   LDL: 65  (01/17/2010)   LDL Direct: Not documented   HDL: 32  (01/17/2010)   Triglycerides: 167  (01/17/2010)    SGOT (AST): 17  (03/29/2010)   SGPT (ALT): 15  (03/29/2010)   Alkaline phosphatase: 65  (03/29/2010)   Total bilirubin: 0.3  (03/29/2010)    Lipid flowsheet reviewed?: Yes   Progress toward LDL goal: Unchanged  Hypertension   Last Blood Pressure: 155 / 77  (01/11/2011)   Serum creatinine: 2.06  (03/29/2010)   Serum potassium 4.5  (03/29/2010)    Hypertension flowsheet reviewed?: Yes   Progress toward BP goal: Unchanged  Self-Management Support :   Personal Goals (by the next clinic visit) :     Personal A1C goal: 8  (02/19/2010)     Personal blood pressure goal: 140/90  (02/19/2010)     Personal LDL goal: 100  (02/19/2010)    Patient will work on the following items until the next clinic visit to reach self-care goals:     Medications and monitoring: take my medicines every day, bring all of my medications to every visit  (01/11/2011)     Eating: drink diet soda or water instead of juice or soda, eat more vegetables, use fresh or frozen vegetables, eat foods that are low in salt, eat baked foods instead of fried foods, eat fruit for snacks and desserts, limit or avoid alcohol  (01/11/2011)     Activity: take a 30 minute walk every day  (01/11/2011)    Diabetes self-management  support: Written self-care plan  (01/11/2011)   Diabetes care plan printed    Hypertension self-management support: Written self-care plan  (01/11/2011)   Hypertension self-care plan printed.    Lipid self-management support: Written self-care plan  (01/11/2011)   Lipid self-care plan printed.

## 2011-01-24 NOTE — Consult Note (Signed)
Summary: [facility]  [facility]   Imported By: De Nurse 01/11/2011 12:43:45  _____________________________________________________________________  External Attachment:    Type:   Image     Comment:   External Document

## 2011-01-24 NOTE — Progress Notes (Signed)
Summary: xray results  Phone Note Call from Patient Call back at Home Phone [phone]   Summary of Call: asking for xray results Initial call taken by: Knox Royalty,  January 15, 2011 2:36 PM  Follow-up for Phone Call        called pt. she had x-ray at [facility] on friday. will check on results and call her back. pt agreed. Follow-up by: Arlyss Repress CMA,,  January 15, 2011 3:45 PM  Additional Follow-up for Phone Call Additional follow up Details #1::        called pt and informed that x-ray is WNL. no fx. Additional Follow-up by: Arlyss Repress CMA,,  January 15, 2011 3:59 PM

## 2011-01-29 ENCOUNTER — Encounter (HOSPITAL_BASED_OUTPATIENT_CLINIC_OR_DEPARTMENT_OTHER): Payer: No Typology Code available for payment source | Admitting: Oncology

## 2011-01-29 ENCOUNTER — Other Ambulatory Visit: Payer: Self-pay | Admitting: Oncology

## 2011-01-29 DIAGNOSIS — D472 Monoclonal gammopathy: Secondary | ICD-10-CM

## 2011-01-29 DIAGNOSIS — E119 Type 2 diabetes mellitus without complications: Secondary | ICD-10-CM

## 2011-01-29 DIAGNOSIS — D649 Anemia, unspecified: Secondary | ICD-10-CM

## 2011-01-29 DIAGNOSIS — N289 Disorder of kidney and ureter, unspecified: Secondary | ICD-10-CM

## 2011-01-29 LAB — CBC WITH DIFFERENTIAL/PLATELET
Basophils Absolute: 0.1 10*3/uL (ref 0.0–0.1)
HGB: 11.5 g/dL — ABNORMAL LOW (ref 11.6–15.9)
LYMPH%: 27.1 % (ref 14.0–49.7)
MCH: 27.6 pg (ref 25.1–34.0)
MCHC: 33.2 g/dL (ref 31.5–36.0)
MCV: 83.1 fL (ref 79.5–101.0)
NEUT#: 4.1 10*3/uL (ref 1.5–6.5)
RBC: 4.17 10*6/uL (ref 3.70–5.45)
lymph#: 1.8 10*3/uL (ref 0.9–3.3)

## 2011-01-29 LAB — COMPREHENSIVE METABOLIC PANEL
ALT: 16 U/L (ref 0–35)
AST: 19 U/L (ref 0–37)
Alkaline Phosphatase: 68 U/L (ref 39–117)
CO2: 26 mEq/L (ref 19–32)
Calcium: 10 mg/dL (ref 8.4–10.5)
Potassium: 4.1 mEq/L (ref 3.5–5.3)

## 2011-01-31 LAB — SPEP & IFE WITH QIG
Alpha-1-Globulin: 5.6 % — ABNORMAL HIGH (ref 2.9–4.9)
Alpha-2-Globulin: 14.1 % — ABNORMAL HIGH (ref 7.1–11.8)
Beta Globulin: 7.2 % (ref 4.7–7.2)
IgG (Immunoglobin G), Serum: 1110 mg/dL (ref 694–1618)
Total Protein, Serum Electrophoresis: 7.4 g/dL (ref 6.0–8.3)

## 2011-01-31 LAB — KAPPA/LAMBDA LIGHT CHAINS
Kappa free light chain: 3.84 mg/dL — ABNORMAL HIGH (ref 0.33–1.94)
Kappa:Lambda Ratio: 1.06 (ref 0.26–1.65)
Lambda Free Lght Chn: 3.62 mg/dL — ABNORMAL HIGH (ref 0.57–2.63)

## 2011-02-25 LAB — PROTIME-INR
INR: 1.22 (ref 0.00–1.49)
Prothrombin Time: 15.3 seconds — ABNORMAL HIGH (ref 11.6–15.2)

## 2011-02-25 LAB — CBC
Platelets: 192 10*3/uL (ref 150–400)
RBC: 3.32 MIL/uL — ABNORMAL LOW (ref 3.87–5.11)
WBC: 6.5 10*3/uL (ref 4.0–10.5)

## 2011-02-25 LAB — CHROMOSOME ANALYSIS, BONE MARROW

## 2011-02-25 LAB — TISSUE HYBRIDIZATION (BONE MARROW)-NCBH

## 2011-02-25 LAB — GLUCOSE, CAPILLARY: Glucose-Capillary: 118 mg/dL — ABNORMAL HIGH (ref 70–99)

## 2011-02-25 LAB — BONE MARROW EXAM

## 2011-02-26 LAB — GLUCOSE, CAPILLARY: Glucose-Capillary: 131 mg/dL — ABNORMAL HIGH (ref 70–99)

## 2011-02-27 LAB — CBC
HCT: 36.5 % (ref 36.0–46.0)
Hemoglobin: 10.8 g/dL — ABNORMAL LOW (ref 12.0–15.0)
Hemoglobin: 12.4 g/dL (ref 12.0–15.0)
MCHC: 34.1 g/dL (ref 30.0–36.0)
MCHC: 34.5 g/dL (ref 30.0–36.0)
MCV: 87.5 fL (ref 78.0–100.0)
Platelets: 156 10*3/uL (ref 150–400)
RBC: 3.58 MIL/uL — ABNORMAL LOW (ref 3.87–5.11)
RDW: 14.4 % (ref 11.5–15.5)

## 2011-02-27 LAB — POCT CARDIAC MARKERS
CKMB, poc: 13.1 ng/mL (ref 1.0–8.0)
Myoglobin, poc: >500 ng/mL (ref 12–200)
Troponin i, poc: 0.06 ng/mL (ref 0.00–0.09)
Troponin i, poc: <0.05 ng/mL (ref 0.00–0.09)

## 2011-02-27 LAB — BASIC METABOLIC PANEL
BUN: 19 mg/dL (ref 6–23)
BUN: 21 mg/dL (ref 6–23)
BUN: 29 mg/dL — ABNORMAL HIGH (ref 6–23)
CO2: 20 mEq/L (ref 19–32)
CO2: 23 mEq/L (ref 19–32)
CO2: 23 mEq/L (ref 19–32)
CO2: 24 mEq/L (ref 19–32)
Calcium: 8 mg/dL — ABNORMAL LOW (ref 8.4–10.5)
Calcium: 9.1 mg/dL (ref 8.4–10.5)
Calcium: 9.7 mg/dL (ref 8.4–10.5)
Chloride: 105 mEq/L (ref 96–112)
Chloride: 107 mEq/L (ref 96–112)
Creatinine, Ser: 1.55 mg/dL — ABNORMAL HIGH (ref 0.4–1.2)
Creatinine, Ser: 1.56 mg/dL — ABNORMAL HIGH (ref 0.4–1.2)
GFR calc Af Amer: 31 mL/min — ABNORMAL LOW (ref >60)
GFR calc Af Amer: 40 mL/min — ABNORMAL LOW (ref >60)
GFR calc non Af Amer: 25 mL/min — ABNORMAL LOW (ref >60)
GFR calc non Af Amer: 33 mL/min — ABNORMAL LOW (ref >60)
Glucose, Bld: 207 mg/dL — ABNORMAL HIGH (ref 70–99)
Glucose, Bld: 245 mg/dL — ABNORMAL HIGH (ref 70–99)
Glucose, Bld: 328 mg/dL — ABNORMAL HIGH (ref 70–99)
Potassium: 3.6 mEq/L (ref 3.5–5.1)
Potassium: 3.8 mEq/L (ref 3.5–5.1)
Sodium: 130 mEq/L — ABNORMAL LOW (ref 135–145)
Sodium: 134 mEq/L — ABNORMAL LOW (ref 135–145)

## 2011-02-27 LAB — DIFFERENTIAL
Basophils Relative: 0 % (ref 0–1)
Lymphocytes Relative: 14 % (ref 12–46)
Lymphs Abs: 1.5 10*3/uL (ref 0.7–4.0)
Monocytes Relative: 8 % (ref 3–12)
Neutro Abs: 8.3 10*3/uL — ABNORMAL HIGH (ref 1.7–7.7)
Neutrophils Relative %: 78 % — ABNORMAL HIGH (ref 43–77)

## 2011-02-27 LAB — LIPID PANEL
Total CHOL/HDL Ratio: 4.1 RATIO
VLDL: 33 mg/dL (ref 0–40)

## 2011-02-27 LAB — GLUCOSE, CAPILLARY
Glucose-Capillary: 196 mg/dL — ABNORMAL HIGH (ref 70–99)
Glucose-Capillary: 210 mg/dL — ABNORMAL HIGH (ref 70–99)
Glucose-Capillary: 229 mg/dL — ABNORMAL HIGH (ref 70–99)
Glucose-Capillary: 230 mg/dL — ABNORMAL HIGH (ref 70–99)
Glucose-Capillary: 256 mg/dL — ABNORMAL HIGH (ref 70–99)
Glucose-Capillary: 294 mg/dL — ABNORMAL HIGH (ref 70–99)
Glucose-Capillary: 316 mg/dL — ABNORMAL HIGH (ref 70–99)

## 2011-02-27 LAB — CARDIAC PANEL(CRET KIN+CKTOT+MB+TROPI)
CK, MB: 2.6 ng/mL (ref 0.3–4.0)
Relative Index: 1 (ref 0.0–2.5)
Relative Index: 1.1 (ref 0.0–2.5)
Relative Index: 1.4 (ref 0.0–2.5)
Relative Index: 1.9 (ref 0.0–2.5)
Total CK: 162 U/L (ref 7–177)
Total CK: 228 U/L — ABNORMAL HIGH (ref 7–177)
Troponin I: 0.03 ng/mL (ref 0.00–0.06)
Troponin I: 0.04 ng/mL (ref 0.00–0.06)
Troponin I: 0.04 ng/mL (ref 0.00–0.06)
Troponin I: 0.04 ng/mL (ref 0.00–0.06)

## 2011-02-27 LAB — HEMOGLOBIN A1C: Mean Plasma Glucose: 387 mg/dL

## 2011-02-27 LAB — COMPREHENSIVE METABOLIC PANEL
Albumin: 2.8 g/dL — ABNORMAL LOW (ref 3.5–5.2)
BUN: 44 mg/dL — ABNORMAL HIGH (ref 6–23)
Calcium: 11 mg/dL — ABNORMAL HIGH (ref 8.4–10.5)
Glucose, Bld: 391 mg/dL — ABNORMAL HIGH (ref 70–99)
Total Protein: 7.1 g/dL (ref 6.0–8.3)

## 2011-02-27 LAB — URINALYSIS, ROUTINE W REFLEX MICROSCOPIC
Nitrite: NEGATIVE
Protein, ur: >300 mg/dL — AB
Urobilinogen, UA: 0.2 mg/dL (ref 0.0–1.0)

## 2011-02-27 LAB — URINE MICROSCOPIC-ADD ON

## 2011-02-27 LAB — TSH: TSH: 0.938 u[IU]/mL (ref 0.350–4.500)

## 2011-02-27 LAB — URINE CULTURE: Colony Count: >=100000

## 2011-02-27 LAB — CK
Total CK: 1010 U/L — ABNORMAL HIGH (ref 7–177)
Total CK: 401 U/L — ABNORMAL HIGH (ref 7–177)

## 2011-03-05 ENCOUNTER — Other Ambulatory Visit: Payer: Self-pay | Admitting: Family Medicine

## 2011-03-05 NOTE — Telephone Encounter (Signed)
Refill request

## 2011-03-06 ENCOUNTER — Other Ambulatory Visit: Payer: Self-pay | Admitting: Family Medicine

## 2011-03-06 NOTE — Telephone Encounter (Signed)
Refill request

## 2011-03-11 ENCOUNTER — Encounter: Payer: Self-pay | Admitting: Home Health Services

## 2011-03-11 ENCOUNTER — Ambulatory Visit (INDEPENDENT_AMBULATORY_CARE_PROVIDER_SITE_OTHER): Payer: No Typology Code available for payment source | Admitting: Family Medicine

## 2011-03-11 ENCOUNTER — Encounter: Payer: Self-pay | Admitting: Family Medicine

## 2011-03-11 DIAGNOSIS — E785 Hyperlipidemia, unspecified: Secondary | ICD-10-CM

## 2011-03-11 DIAGNOSIS — N183 Chronic kidney disease, stage 3 unspecified: Secondary | ICD-10-CM

## 2011-03-11 DIAGNOSIS — F17200 Nicotine dependence, unspecified, uncomplicated: Secondary | ICD-10-CM

## 2011-03-11 MED ORDER — VARENICLINE TARTRATE 1 MG PO TABS: 1.0000 mg | ORAL_TABLET | Freq: Two times a day (BID) | ORAL | Status: AC

## 2011-03-11 MED ORDER — ROSUVASTATIN CALCIUM 20 MG PO TABS: 20.0000 mg | ORAL_TABLET | Freq: Every day | ORAL | Status: DC

## 2011-03-11 NOTE — Patient Instructions (Signed)
It was nice to see you today. If Crestor is not covered, please ask about Lipitor. Follow up in 1-2 months.   CHANTIX INFORMATION: IMPORTANT: HOW TO USE THIS INFORMATION:  This is a summary and does NOT have all possible information about this product. This information does not assure that this product is safe, effective, or appropriate for you. This information is not individual medical advice and does not substitute for the advice of your health care professional. Always ask your health care professional for complete information about this product and your specific health needs.    VARENICLINE - ORAL (var-[id number]-kleen)    COMMON BRAND NAME(S): Chantix    WARNING:  Infrequently, varenicline may cause serious mental/mood changes, even after stopping the medication. Drinking alcohol while taking this medication may increase the risk for mental/mood changes. Quitting smoking itself may also cause mental/mood changes. Stop taking varenicline and tell your doctor or pharmacist immediately if you have symptoms such as depression/suicidal thoughts, agitation, aggressiveness, or other unusual thoughts or behavior.    USES:  This medication is used in combination with a stop-smoking program (e.g., education materials, support group, counseling) to help you quit smoking. Varenicline works by blocking nicotine's actions in the brain. Quitting smoking decreases your risk of heart and lung disease, as well as cancer.    HOW TO USE:  Read the Medication Guide provided by your pharmacist before you start taking varenicline and each time you get a refill. If you have any questions, consult your doctor or pharmacist. Follow your doctor's directions carefully. Before beginning treatment with this drug, set a date to quit smoking. Begin taking varenicline 1 week before the quit date as follows unless directed otherwise by your doctor. When you first start taking this medication, take one 0.5-milligram tablet once a day  for 3 days, then increase to one 0.5-milligram tablet twice a day for 4 days. The dose is slowly increased to lessen the chance of side effects (e.g., nausea, unusual dreams). During this first week, it is okay to smoke. Stop smoking on the quit date and begin taking the dose prescribed by your doctor twice a day for the rest of the 12-week treatment period. If this medication comes in a dosing package, carefully follow the directions on the dosing package. There are two types of dosing packs, a starting pack and a continuing pack, each containing different strengths of this medication. If this medication comes in a bottle, carefully follow your doctor's directions on the prescription label. If you have any questions about how to take this medication, talk to your doctor or pharmacist. Take this medication by mouth after food and with a full glass of water. Dosage is based on your medical condition and response to therapy. Do not increase your dose or take this medication more often than prescribed. Your condition will not improve any faster, and the risk of serious side effects may be increased. Do not take more than 1 milligram twice a day. Use this medication regularly to get the most benefit from it. To help you remember, take it at the same time(s) each day. Inform your doctor if you continue to smoke after a few weeks of treatment. If you are successful and cigarette-free after 12 weeks of treatment, your doctor may recommend another 12 weeks of treatment with varenicline.    SIDE EFFECTS:  See also Warning section. Nausea, headache, vomiting, drowsiness, gas, constipation, trouble sleeping, unusual dreams, or changes in taste may occur. If any of these  effects persist or worsen, tell your doctor or pharmacist promptly. Remember that your doctor has prescribed this medication because he or she has judged that the benefit to you is greater than the risk of side effects. Many people using this medication do  not have serious side effects. A very serious allergic reaction to this drug is rare. However, seek immediate medical attention if you notice any symptoms of a serious allergic reaction, including: rash, itching/swelling (especially of the face/tongue/throat), severe dizziness, trouble breathing. This is not a complete list of possible side effects. If you notice other effects not listed above, contact your doctor or pharmacist. In the [place] - Call your doctor for medical advice about side effects. You may report side effects to FDA at 1-800-FDA-1088. In [place] - Call your doctor for medical advice about side effects. You may report side effects to [facility] [place] at [phone].    PRECAUTIONS:  Before taking varenicline, tell your doctor or pharmacist if you are allergic to it; or if you have any other allergies. This product may contain inactive ingredients, which can cause allergic reactions or other problems. Talk to your pharmacist for more details. Before using this medication, tell your doctor or pharmacist your medical history, especially of: kidney disease (especially kidney dialysis), mental/mood disorders. This drug may make you drowsy. Do not drive, use machinery, or do any activity that requires alertness until you are sure you can perform such activities safely. Avoid alcoholic beverages. Kidney function declines as you grow older. This medication is removed by the kidneys. Therefore, elderly people may be at a greater risk for nausea and other side effects while using this drug. During pregnancy, this medication should be used only when clearly needed. Discuss the risks and benefits with your doctor. It is unknown if this medication passes into breast milk. Consult your doctor before breast-feeding.    DRUG INTERACTIONS:  Your doctor or pharmacist may already be aware of any possible drug interactions and may be monitoring you for them. Do not start, stop, or change the dosage of any medicine  before checking with your doctor or pharmacist first. Before using this medication, tell your doctor or pharmacist of all prescription and nonprescription/herbal products you may use, especially of: nicotine replacement therapy (e.g., patch, gum, nasal spray). Smoking can affect the way your body removes certain drugs. When you stop smoking, your doses of these drugs may need to be adjusted by your doctor. Tell your doctor if you take any of the following medications. Some of the drugs that smoking may affect, among others, are: "blood thinners" (e.g., warfarin), insulin, theophylline. This document does not contain all possible interactions. Therefore, before using this product, tell your doctor or pharmacist of all the products you use. Keep a list of all your medications with you, and share the list with your doctor and pharmacist.    OVERDOSE:  If overdose is suspected, contact your local poison control center or emergency room immediately. [place] residents should call the [place] National Poison Hotline at (727)105-4003. [place] residents should call a provincial poison control center.    NOTES:  Do not share this medication with others. Laboratory and/or medical tests (e.g., kidney function) should be performed periodically to monitor your progress or check for side effects. Consult your doctor for more details. Getting regular exercise and maintaining a nutritious diet, along with using educational materials, receiving counseling, and attending support groups, may help you to successfully quit smoking. Consult your doctor for details.  MISSED DOSE:  If you miss a dose, take it as soon as you remember. If it is near the time of the next dose, skip the missed dose and resume your usual dosing schedule. Do not double the dose to catch up.    STORAGE:  Store at room temperature at 77 degrees F (25 degrees C) away from light and moisture. Brief storage between 59-86 degrees F (15-30 degrees C) is permitted. Do  not store in the bathroom. Keep all medicines away from children and pets. Do not flush medications down the toilet or pour them into a drain unless instructed to do so. Properly discard this product when it is expired or no longer needed. Consult your pharmacist or local waste disposal company for more details about how to safely discard your product.    Information last revised September 2010 Copyright(c) 2010 [employer].

## 2011-03-12 ENCOUNTER — Encounter: Payer: Self-pay | Admitting: Family Medicine

## 2011-03-12 ENCOUNTER — Telehealth: Payer: Self-pay | Admitting: *Deleted

## 2011-03-12 NOTE — Progress Notes (Signed)
  Subjective:     Kristi [name] is a 70 y.o. female with > 40 year Hx of tobacco use. She quit x 2 years ago, but restarted last winter. She is smoking 1/2-3/4 PPD. She previously quit using Chantix. She is ready to stop again.   Assessment:    Tobacco Use/Cessation. I assessed Kristi [name] to be in an action stage with respect to tobacco cessation.    Plan:   1. Chantix. Follow up in 2 months.

## 2011-03-12 NOTE — Telephone Encounter (Signed)
PA required for Chantix. Form placed in MD box.

## 2011-03-12 NOTE — Assessment & Plan Note (Signed)
Patient says that recent labs showed Cr = 2.1.

## 2011-03-12 NOTE — Assessment & Plan Note (Signed)
Recent lab work at [facility] by Dr. Eliott Nine. "She said that my LDL was too high - 120." Will change medication to Crestor today (was Zocor 40) and requests labs from Dr. Eliott Nine.

## 2011-03-13 NOTE — Telephone Encounter (Signed)
Completed and placed in fax box 

## 2011-03-19 ENCOUNTER — Other Ambulatory Visit: Payer: Self-pay | Admitting: Family Medicine

## 2011-03-19 NOTE — Telephone Encounter (Signed)
Refill request

## 2011-03-21 NOTE — Telephone Encounter (Signed)
recieved PA request again from pharmacy for Chantix  . Called insurance again to get another form and was told that this med was denied on 03/14/2011. Patient needs to have tried a nicotine replacement therapy and wellbutrin. Will notify MD.

## 2011-03-26 ENCOUNTER — Encounter (HOSPITAL_COMMUNITY): Payer: Medicare Other | Attending: Nephrology

## 2011-03-26 DIAGNOSIS — D509 Iron deficiency anemia, unspecified: Secondary | ICD-10-CM | POA: Insufficient documentation

## 2011-03-27 ENCOUNTER — Ambulatory Visit (INDEPENDENT_AMBULATORY_CARE_PROVIDER_SITE_OTHER): Payer: No Typology Code available for payment source | Admitting: Vascular Surgery

## 2011-03-27 ENCOUNTER — Encounter (INDEPENDENT_AMBULATORY_CARE_PROVIDER_SITE_OTHER): Payer: No Typology Code available for payment source

## 2011-03-27 DIAGNOSIS — Z01810 Encounter for preprocedural cardiovascular examination: Secondary | ICD-10-CM

## 2011-03-27 DIAGNOSIS — N186 End stage renal disease: Secondary | ICD-10-CM

## 2011-03-27 DIAGNOSIS — N184 Chronic kidney disease, stage 4 (severe): Secondary | ICD-10-CM

## 2011-03-28 ENCOUNTER — Telehealth: Payer: Self-pay | Admitting: *Deleted

## 2011-03-28 NOTE — Telephone Encounter (Signed)
Called patient and left message to return call, please see message below.Busick, Rodena Medin

## 2011-03-28 NOTE — Consult Note (Signed)
NEW PATIENT CONSULTATION  Kristi [name], Kristi [name] DOB:  Dec 28, 1940                                       03/27/2011 CHART#:[id number]  I saw Kristi [name] in the office today in consultation for hemodialysis access.  She was referred by Dr. Eliott Nine.  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  She is not yet on dialysis.  However, it is felt that she will need dialysis in the near future and we are asked provide access.  She has had some generalized fatigue which has been stable. She also admits to dyspnea on exertion.  She has had no other uremic symptoms.  Specifically she denies nausea, vomiting, palpitations or anorexia.  She does have a history of anemia and feels better after having a recent iron injection.  PAST MEDICAL HISTORY:  Significant for insulin dependent diabetes, hypertension, hypercholesterolemia, anemia, secondary hyperparathyroidism and coronary artery disease.  She had previous bare metal stent in 2008.  SOCIAL HISTORY:  She is single.  She has 3 children.  She has been a smoker but is in the process of trying to quit.  FAMILY HISTORY:  She is unaware of any history of premature cardiovascular disease.  REVIEW OF SYSTEMS:  GENERAL:  She has had no recent weight loss, weight gain or problems with her appetite. CARDIOVASCULAR:  She has had no chest pain, chest pressure, palpitations or arrhythmias.  She does admit to dyspnea on exertion.  She had no history of stroke, TIAs or amaurosis fugax. GI:  She does have a history of reflux. NEUROLOGIC:  She has had no dizziness, blackouts, headaches or seizures. PULMONARY:  She has had no productive cough, bronchitis, asthma or wheezing. HEMATOLOGY:  She does have anemia.  She has had no bleeding problems or clotting disorders. GU:  She has had no dysuria or frequency. ENT:  She has had some gradual change in eye  sight. MUSCULOSKELETAL:  She does have a history of arthritis and joint pain. PSYCHIATRIC:  She has had no depression or anxiety. INTEGUMENTARY:  She has had no rashes or ulcers.  PHYSICAL EXAMINATION:  This is a pleasant 70 year old woman who appears her stated age. Blood pressure is 108/67, heart rate is 61, saturation 98%. HEENT:  Unremarkable. LUNGS:  Clear bilaterally to auscultation without rales, rhonchi or wheezing. CARDIOVASCULAR:  I do not detect any carotid bruits.  She has a regular rate and rhythm.  She has brachial and radial pulses bilaterally.  Both feet are warm well-perfused.  She has no significant extremity swelling. ABDOMEN:  Obese and difficult to assess.  She has normal pitched bowel sounds. MUSCULOSKELETAL:  No major deformities or cyanosis. NEUROLOGIC:  She has no focal weakness or paresthesias. SKIN:  There are no ulcers or rashes.  I have independently interpreted her vein mapping which shows that the cephalic vein in the left arm appears to be larger than on the right arm.  In the left arm, however, even the forearm upper arm cephalic vein is marginal in size.  Basilic vein is slightly larger but also somewhat marginal in size.  I have also reviewed her records from Dr. Elza Rafter office.  On 03/26, hemoglobin was 11.8, potassium was 4.4, creatinine was 2.16.  I have recommended we explore her forearm cephalic vein on the left and if this  is adequate we would place a radiocephalic fistula.  If this is not adequate, she could potentially have a brachiocephalic fistula.  If neither were adequate, she could potentially have a basilic transposition and if the basilic vein were not adequate, she would require an AV graft.  I have discussed the indications for surgery and the potential complications including but not limited to failure of the fistula to mature, graft thrombosis, graft infection, steal syndrome or other unpredictable medical problems.  All of  her questions were answered and she is agreeable to proceed.  Her surgery has been scheduled for April 05, 2011.    Di Kindle. Edilia Bo, M.D. Electronically Signed  CSD/MEDQ  D:  03/27/2011  T:  03/28/2011  Job:  [id number]  cc:   D[facility] Salvia. Eliott Nine, M.D.

## 2011-03-28 NOTE — Telephone Encounter (Signed)
Message copied by Tessie Fass on Thu Mar 28, 2011  5:31 PM ------      Message from: Helane Rima      Created: Wed Mar 27, 2011  5:36 PM       Patient's Rx for Chantix denied because she needs to have tried Nicotine Patch and Wellbutrin.             Please see if patient is willing to try Nicotine Patch (or if she has tried it before).            I do not want her to take Wellbutrin because of her kidney function.            Thanks!

## 2011-03-29 ENCOUNTER — Encounter: Payer: Self-pay | Admitting: Home Health Services

## 2011-03-29 ENCOUNTER — Ambulatory Visit (INDEPENDENT_AMBULATORY_CARE_PROVIDER_SITE_OTHER): Payer: No Typology Code available for payment source | Admitting: Home Health Services

## 2011-03-29 ENCOUNTER — Other Ambulatory Visit (INDEPENDENT_AMBULATORY_CARE_PROVIDER_SITE_OTHER): Payer: No Typology Code available for payment source | Admitting: Family Medicine

## 2011-03-29 VITALS — BP 148/69 | Temp 98.2°F | Ht 61.5 in | Wt 201.3 lb

## 2011-03-29 DIAGNOSIS — Z0000 Encounter for general adult medical examination without abnormal findings: Secondary | ICD-10-CM

## 2011-03-29 DIAGNOSIS — E119 Type 2 diabetes mellitus without complications: Secondary | ICD-10-CM

## 2011-03-29 LAB — POCT GLYCOSYLATED HEMOGLOBIN (HGB A1C): Hemoglobin A1C: 7.1

## 2011-03-29 NOTE — Progress Notes (Signed)
I have reviewed this visit and discussed with Suzanne Lineberry and agree with her documentation  

## 2011-03-29 NOTE — Progress Notes (Signed)
Patient here for annual wellness visit, patient reports: Risk Factors/Conditions needing evaluation or treatment: Patient does not have any risk factors that need evaluation.  Patient is currently smoke free for 2 days and is using the electronic cigarette and nicotine gum.  Home Safety: Patient lives by her self in 1 story home.  Patient reports having smoke detectors but does not have adaptive equipment in the bathroom.  Other Information: Corrective lens: Patient wears corrective lens for TV and visits eye doctor annually.  Has been treated for glaucoma in the past. Dentures: Patient does not have dentures and sees dentist as needed. Memory: Patient reports some memory loss. Patient's Mini Mental Score (recorded in doc. flowsheet): 30  Balance max value patientvalue  Sitting balance 1 1  Arise 2 1  Attempts to arise 2 2  Immediate standing balance 2 1  Standing balance 1 1  Nudge 2 2  Eyes closed 1 1  360 degree turn 1 1  Sitting down 2 1   Gait max value patient value  Initiation of gait 1 1  Step length-left 1 1  Step length-right 1 1  Step height-left 1 0  Step height-right 1 1  Step symmetry 1 1  Step continuity 1 1  Path 2 2  Trunk 2 2  Walking stance 1 1   Balance/Gait Score: 22/26    Annual Wellness Visit Requirements Recorded Today In  Medical, family, social history Past Medical, Family, Social History Section  Current providers Care team  Current medications Medications  Wt, BP, Ht, BMI Vital signs  Hearing assessment (welcome visit) declined  Tobacco, alcohol, illicit drug use History  ADL Nurse Assessment  Depression Screening Nurse Assessment  Cognitive impairment Nurse Assessment  Mini Mental Status Document Flowsheet  Fall Risk Nurse Assessment  Home Safety Progress Note  End of Life Planning (welcome visit) Social Documentation  Medicare preventative services Progress Note  Risk factors/conditions needing evaluation/treatment Progress Note    Personalized health advice Patient Instructions, goals, letter  Diet & Exercise Social Documentation  Emergency Contact Social Documentation  Seat Belts Social Documentation  Sun exposure/protection Social Documentation    Prevention Plan: Recommended patient follow up with pharmacy for shingles vaccine.  Recommended Medicare Prevention Screenings Women over 55 Test For Frequency Date of Last- BOLD if needed  Breast Cancer 1-2 yrs 11/11  Cervical Cancer 1-3 yrs Not indicated  Colorectal Cancer 1-10 yrs declined  Osteoporosis once 2009  Cholesterol 5 yrs 2/11  Diabetes yearly 4/11  HIV yearly declined  Influenza Shot yearly 10/11  Pneumonia Shot once 12/17/09  Zostavax Shot once recommended

## 2011-03-29 NOTE — Patient Instructions (Signed)
1. Continue to abstain from smoking...GOOD JOB! 2. Try to eat at least 4 vegetables a day and 2 fruits a day. 3. Call pharmacy for shingle vaccine. 4. Find ways to move more each day.

## 2011-04-02 NOTE — Telephone Encounter (Signed)
Patient never returned call.Busick, Robert Lee  

## 2011-04-03 ENCOUNTER — Encounter (HOSPITAL_COMMUNITY)
Admission: RE | Admit: 2011-04-03 | Discharge: 2011-04-03 | Disposition: A | Discharge status: Home / Self Care | Payer: Medicare Other | Source: Ambulatory Visit | Attending: Vascular Surgery | Admitting: Vascular Surgery

## 2011-04-03 ENCOUNTER — Other Ambulatory Visit: Payer: Self-pay | Admitting: Vascular Surgery

## 2011-04-03 ENCOUNTER — Ambulatory Visit (HOSPITAL_COMMUNITY)
Admission: RE | Admit: 2011-04-03 | Discharge: 2011-04-03 | Disposition: A | Discharge status: Home / Self Care | Payer: Medicare Other | Source: Ambulatory Visit | Attending: Vascular Surgery | Admitting: Vascular Surgery

## 2011-04-03 DIAGNOSIS — R059 Cough, unspecified: Secondary | ICD-10-CM | POA: Insufficient documentation

## 2011-04-03 DIAGNOSIS — N186 End stage renal disease: Secondary | ICD-10-CM

## 2011-04-03 DIAGNOSIS — J4489 Other specified chronic obstructive pulmonary disease: Secondary | ICD-10-CM | POA: Insufficient documentation

## 2011-04-03 DIAGNOSIS — Z01818 Encounter for other preprocedural examination: Secondary | ICD-10-CM | POA: Insufficient documentation

## 2011-04-03 DIAGNOSIS — R05 Cough: Secondary | ICD-10-CM | POA: Insufficient documentation

## 2011-04-03 DIAGNOSIS — J449 Chronic obstructive pulmonary disease, unspecified: Secondary | ICD-10-CM | POA: Insufficient documentation

## 2011-04-03 DIAGNOSIS — F17200 Nicotine dependence, unspecified, uncomplicated: Secondary | ICD-10-CM | POA: Insufficient documentation

## 2011-04-03 DIAGNOSIS — R0602 Shortness of breath: Secondary | ICD-10-CM | POA: Insufficient documentation

## 2011-04-03 DIAGNOSIS — I10 Essential (primary) hypertension: Secondary | ICD-10-CM | POA: Insufficient documentation

## 2011-04-03 DIAGNOSIS — Z01812 Encounter for preprocedural laboratory examination: Secondary | ICD-10-CM | POA: Insufficient documentation

## 2011-04-03 LAB — BASIC METABOLIC PANEL
BUN: 45 mg/dL — ABNORMAL HIGH (ref 6–23)
Chloride: 105 mEq/L (ref 96–112)
GFR calc Af Amer: 21 mL/min — ABNORMAL LOW (ref >60)
GFR calc non Af Amer: 18 mL/min — ABNORMAL LOW (ref >60)
Potassium: 4.8 mEq/L (ref 3.5–5.1)

## 2011-04-03 LAB — SURGICAL PCR SCREEN
MRSA, PCR: NEGATIVE
Staphylococcus aureus: NEGATIVE

## 2011-04-03 LAB — CBC
MCV: 84.1 fL (ref 78.0–100.0)
Platelets: 221 10*3/uL (ref 150–400)
RBC: 4.54 MIL/uL (ref 3.87–5.11)
RDW: 17.4 % — ABNORMAL HIGH (ref 11.5–15.5)
WBC: 8.2 10*3/uL (ref 4.0–10.5)

## 2011-04-03 NOTE — Procedures (Unsigned)
CEPHALIC VEIN MAPPING  INDICATION:  Preoperative vein mapping for AVF placement.  HISTORY: Chronic kidney disease, hypertension, diabetes, hyperlipidemia, MI, current smoker.  EXAM: The right cephalic vein is compressible.  Diameter measurements range from 0.12 to 0.23 cm.  The right basilic vein is compressible.  Diameter measurements range from 0.23 to 0.39 cm.  The left cephalic vein is compressible.  Diameter measurements range from 0.14 to 0.32 cm.  The left basilic vein is compressible.  Diameter measurements range from 0.23 to 0.36 cm.  See attached worksheet for all measurements.  IMPRESSION:  Patent right and left cephalic and basilic veins with diameter measurements as described above.  ___________________________________________ Di Kindle. Edilia Bo, M.D.  EM/MEDQ  D:  03/27/2011  T:  03/27/2011  Job:  [id number]

## 2011-04-05 ENCOUNTER — Ambulatory Visit (HOSPITAL_COMMUNITY)
Admission: RE | Admit: 2011-04-05 | Discharge: 2011-04-05 | Disposition: A | Discharge status: Home / Self Care | Payer: Medicare Other | Source: Ambulatory Visit | Attending: Vascular Surgery | Admitting: Vascular Surgery

## 2011-04-05 DIAGNOSIS — I120 Hypertensive chronic kidney disease with stage 5 chronic kidney disease or end stage renal disease: Secondary | ICD-10-CM

## 2011-04-05 DIAGNOSIS — N186 End stage renal disease: Secondary | ICD-10-CM | POA: Insufficient documentation

## 2011-04-05 DIAGNOSIS — K219 Gastro-esophageal reflux disease without esophagitis: Secondary | ICD-10-CM | POA: Insufficient documentation

## 2011-04-05 DIAGNOSIS — J449 Chronic obstructive pulmonary disease, unspecified: Secondary | ICD-10-CM | POA: Insufficient documentation

## 2011-04-05 DIAGNOSIS — E119 Type 2 diabetes mellitus without complications: Secondary | ICD-10-CM | POA: Insufficient documentation

## 2011-04-05 DIAGNOSIS — M129 Arthropathy, unspecified: Secondary | ICD-10-CM | POA: Insufficient documentation

## 2011-04-05 DIAGNOSIS — Z9861 Coronary angioplasty status: Secondary | ICD-10-CM | POA: Insufficient documentation

## 2011-04-05 DIAGNOSIS — J4489 Other specified chronic obstructive pulmonary disease: Secondary | ICD-10-CM | POA: Insufficient documentation

## 2011-04-05 DIAGNOSIS — I2510 Atherosclerotic heart disease of native coronary artery without angina pectoris: Secondary | ICD-10-CM | POA: Insufficient documentation

## 2011-04-05 DIAGNOSIS — Z87891 Personal history of nicotine dependence: Secondary | ICD-10-CM | POA: Insufficient documentation

## 2011-04-05 DIAGNOSIS — Z8673 Personal history of transient ischemic attack (TIA), and cerebral infarction without residual deficits: Secondary | ICD-10-CM | POA: Insufficient documentation

## 2011-04-05 HISTORY — PX: AV FISTULA PLACEMENT: SHX1204

## 2011-04-05 LAB — POCT I-STAT 4, (NA,K, GLUC, HGB,HCT)
Glucose, Bld: 132 mg/dL — ABNORMAL HIGH (ref 70–99)
Hemoglobin: 12.9 g/dL (ref 12.0–15.0)
Potassium: 4.6 mEq/L (ref 3.5–5.1)

## 2011-04-05 LAB — GLUCOSE, CAPILLARY: Glucose-Capillary: 108 mg/dL — ABNORMAL HIGH (ref 70–99)

## 2011-04-10 NOTE — Op Note (Signed)
  Kristi [name], Kristi [name]                ACCOUNT NO.:  [id number]  MEDICAL RECORD NO.:  [id number]           PATIENT TYPE:  O  LOCATION:  SDSC                         FACILITY:  [facility]  PHYSICIAN:  Di Kindle. Edilia Bo, M.D.DATE OF BIRTH:  09-Jun-1941  DATE OF PROCEDURE:  04/05/2011 DATE OF DISCHARGE:  04/05/2011                              OPERATIVE REPORT   PREOPERATIVE DIAGNOSIS:  Chronic kidney disease  POSTOPERATIVE DIAGNOSIS:  Chronic kidney disease.  PROCEDURE:  Left radiocephalic arteriovenous fistula.  SURGEON:  Di Kindle. Edilia Bo, MD  ASSISTANT:  Janett Labella, RNFA  ANESTHESIA:  Local with sedation.  FINDINGS:  It was a 3.5-mm vein, artery had some calcium and was fairly small.  TECHNIQUE:  The patient was taken to the operating room and sedated by Anesthesia.  The left upper extremity was prepped and draped in usual sterile fashion.  After the skin was infiltrated with 1% lidocaine, an oblique incision was made at the wrist and the cephalic vein was dissected free, was ligated distally, and irrigated up with heparinized saline, it was about 3.5-mm vein.  Beneath the fascia, the radial artery was dissected free.  I dissected this out over approximately 2 cm such that I could do a longer arteriotomy and anastomosis.  The patient was then heparinized.  The vein was spatulated, cut to the appropriate length, and sewn end-to-side to the artery using two continuous 6-0 Prolene sutures.  At the completion, there was an excellent thrill in the fistula.  The heparin was partially reversed with protamine.  The wound was closed with deep layer of 3-0 Vicryl, the skin was closed with 4-0 Vicryl.  Sterile dressing was applied.  The patient tolerated the procedure well and was transferred to recovery room in stable condition. All needle and sponge counts were correct.     Di Kindle. Edilia Bo, M.D.     CSD/MEDQ  D:  04/05/2011  T:  04/06/2011  Job:   [id number]  Electronically Signed by Waverly Ferrari M.D. on 04/10/2011 08:00:28 AM

## 2011-04-18 ENCOUNTER — Encounter: Payer: Self-pay | Admitting: Family Medicine

## 2011-04-23 NOTE — Cardiovascular Report (Signed)
Kristi [name], Kristi [name]                ACCOUNT NO.:  [id number]   MEDICAL RECORD NO.:  [id number]          PATIENT TYPE:  OIB   LOCATION:  6522                         FACILITY:  [facility]   PHYSICIAN:  Corky Crafts, MDDATE OF BIRTH:  05-08-1941   DATE OF PROCEDURE:  07/14/2007  DATE OF DISCHARGE:                            CARDIAC CATHETERIZATION   PROCEDURES PERFORMED:  1. Coronary angiogram.  2. Abdominal aortogram.   OPERATOR:  Corky Crafts, MD   INDICATIONS:  Coronary artery disease, prior inferior MI   PROCEDURE:  The risks and benefits of cardiac catheterization were  explained to the patient, and informed consent was obtained.  She was  brought to the catheterization lab and placed on the table.  She was  prepped and draped in the usual sterile fashion.  Her right groin was  infiltrated with 1% lidocaine.  A 6-French arterial sheath was placed  into her right femoral artery using the modified Seldinger technique.  Right coronary artery angiography was performed using a JR-4.0 catheter.  The catheter was advanced to the vessel ostium under fluoroscopic  guidance.  Digital angiography was performed in multiple projections  using hand injection of contrast. Left coronary artery angiography was  then performed with a CLS 3.5 guiding catheter.  Digital angiography was  performed in multiple projections using hand injection of contrast.  Intracoronary nitroglycerin was also administered using the CLS 3.5  guiding catheter.  A pigtail catheter was advanced to the abdominal  aorta.  Power injection of contrast was performed in the AP projection.  The sheath was removed using manual compression.   FINDINGS:  The right coronary artery stent is widely patent with normal  flow throughout the large dominant right coronary artery.  There are  minor irregularities.   The left coronary artery system reveals a patent left main and patent  LAD system with minor irregularities.  After intracoronary nitroglycerin  was administered, the OM-1 lesion appeared to be only about 40-50%.   The abdominal aortogram showed no abdominal aortic aneurysm.  There are  single renal arteries bilaterally, both of which were widely patent.   IMPRESSION:  1. Moderate atherosclerosis of the obtuse marginal #1 of about 40-50%.  2. No renal artery stenosis.   RECOMMENDATIONS:  The patient to continue aspirin and Plavix along with  aggressive secondary prevention. Because of the moderate nature of the  disease in the OM-1, intervention, although previously scheduled, was  not performed.  The patient will continue with medical therapy. It is  likely that this lesion appeared worse during her  acute MI because of some spasm and high-level circulating  catecholamines. At that time her blood pressure was borderline and,  therefore, we were unable to take pictures with intracoronary  nitroglycerin.  I will see her back in the office.      Corky Crafts, MD  Electronically Signed     JSV/MEDQ  D:  07/14/2007  T:  07/14/2007  Job:  [id number]

## 2011-04-23 NOTE — Discharge Summary (Signed)
Kristi [name], Kristi [name]                ACCOUNT NO.:  [id number]   MEDICAL RECORD NO.:  [id number]          PATIENT TYPE:  OIB   LOCATION:  6522                         FACILITY:  [facility]   PHYSICIAN:  Corky Crafts, MDDATE OF BIRTH:  08/06/1941   DATE OF ADMISSION:  07/14/2007  DATE OF DISCHARGE:  07/15/2007                               DISCHARGE SUMMARY   DISCHARGE DIAGNOSES:  1. Coronary artery disease, medical management, aggressive secondary      prevention.  2. Former smoker, Chantix.  3. Long-term medication use.  4. Hyperlipidemia, treated.   HISTORY:  Kristi [name] is a 70 year old female who was admitted for  cardiac catheterization yesterday.  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  She received a bare metal stent to  her right coronary artery.  She apparently had residual stenosis in her  obtuse marginal.  She has remained on Plavix.  She was admitted to  undergo PCI of the OM lesion.   Upon catheterization intercoronary nitroglycerin was given.  The OM  lesion appeared to be at 50%.  The stent to the right coronary artery  was patent.  At this point Dr. Eldridge Dace felt that the patient had  moderate atherosclerosis of the first OM branch, 50%.  Aggressive  secondary prevention was recommended.  The patient will remain on  aspirin, Plavix and her current medications as follows:   1. Plavix 75 mg a day.  2. Aspirin coated 325 mg a day.  3. Metoprolol ER 25 mg a day.  4. Chantix as prior to admission.  5. Zocor 40 mg q.h.s.  6. Sublingual nitroglycerin p.r.n. chest pain.   The patient is instructed not to smoke.  Clean cath site gently with  soap and water; remain on a low-sodium heart-healthy diet.  Increased  activity slowly. May shower.  No lifting over 10 pounds for 1 week.  No  driving for 2 days.  Follow up with Dr. Hoyle Barr extender, Tillman Sers, NP, on 07/28/2007 at 9:10 a.m.  The patient is discharged to home  in a stable and  improved condition.      Guy Franco, P.A.      Corky Crafts, MD  Electronically Signed    LB/MEDQ  D:  07/15/2007  T:  07/15/2007  Job:  [id number]   cc:   Corky Crafts, MD

## 2011-04-23 NOTE — H&P (Signed)
NAMEPOLA, FURNO NO.:  [id number]   MEDICAL RECORD NO.:  [id number]          PATIENT TYPE:  INP   LOCATION:  2905                         FACILITY:  [facility]   PHYSICIAN:  Corky Crafts, MDDATE OF BIRTH:  29-Oct-1941   DATE OF ADMISSION:  06/23/2007  DATE OF DISCHARGE:                              HISTORY & PHYSICAL   CHIEF COMPLAINT:  Chest discomfort.   HISTORY OF PRESENT ILLNESS:  Ms. Bouchillon is a 70 year old white female  presenting with a one-hour history of substernal chest discomfort.  The  patient states that at approximately 8 p.m. this evening she developed  acute onset of substernal chest pressure radiating to her left arm.  The  sensation was associated with shortness of breath and diaphoresis.  She  took a Tums with no relief and called 911.  EMS gave the patient  aspirin, nitroglycerin and Lopressor 5 mg IV.  EKG en route showed 2-3  mm inferior S-T segment elevation.  On arrival to the emergency room,  the patient was still complaining of substernal chest discomfort, but  she says that it is much relieved.  She denies any history of similar  events and states she has not seen a doctor in many years.   PAST MEDICAL HISTORY:  None.   FAMILY HISTORY:  Family history is negative for premature coronary  disease.   SOCIAL HISTORY:  Social history is positive for current tobacco use,  negative for alcohol use.   ALLERGIES:  No known drug allergies.   MEDICATIONS:  None.   REVIEW OF SYSTEMS:  As in HPI.  All other systems reviewed and are  negative.   PHYSICAL EXAMINATION:  VITAL SIGNS:  The patient has a pulse of 76,  respirations 18, blood pressure 119/78.  GENERAL:  Generally, she is in no acute distress.  NECK:  Supple, without any JVD.  HEART:  Regular rate and rhythm without murmur, rub or gallop.  LUNGS:  Clear to auscultation bilaterally.  ABDOMEN:  Soft, nontender.  EXTREMITIES:  Without edema.  NEURO:  Nonfocal.  PSYCHIATRIC:   The patient is appropriate with normal levels of insight.  SKIN:  There are no rashes or lesions.   LABS:  She has a sodium of 137, potassium 4.0, chloride 107, CO2 20, BUN  28, creatinine 1.4, glucose 212.  Her hemoglobin is 12, hematocrit 41.  Her pH is 7.358, pCO2 is 33.  A 12-lead EKG shows a 2-3 mm inferior S-T  segment elevation with 1 to 2 mm S-T segment depression in the high  lateral leads.   IMPRESSION:  Acute inferior S-T segment elevation myocardial infarction.   PLAN:  Risks and benefits of left heart catheterization explained to the  patient, and she wishes to undergo the procedure.  We will bolus her  with heparin and Integrilin and start drips.  She will also be on  nitroglycerin and morphine.  She was given aspirin and a beta-blocker en  route to the hospital.  She is now to be taken urgently to the  catheterization lab.      Roseanne Reno  Kirkland Hun, MD   Electronically Signed     ______________________________  Corky Crafts, MD    SGA/MEDQ  D:  06/23/2007  T:  06/24/2007  Job:  [id number]

## 2011-04-23 NOTE — Cardiovascular Report (Signed)
Kristi [name], Kristi [name]                ACCOUNT NO.:  [id number]   MEDICAL RECORD NO.:  [id number]          PATIENT TYPE:  INP   LOCATION:  1828                         FACILITY:  [facility]   PHYSICIAN:  Corky Crafts, MDDATE OF BIRTH:  June 04, 1941   DATE OF PROCEDURE:  06/23/2007  DATE OF DISCHARGE:                            CARDIAC CATHETERIZATION   PROCEDURES PERFORMED:  Left heart catheterization, left ventriculogram,  coronary angiogram, PCI of the right coronary artery.   OPERATOR:  Dr. Eldridge Dace.   INDICATIONS:  Acute inferior ST-segment elevation MI.   PROCEDURE NARRATIVE:  The risks and benefits of cardiac catheterization  and PCI were explained to the patient briefly, informed consent was  obtained, and the patient was brought to the cath lab emergently.  She  was prepped and draped in the usual sterile fashion.  Her right groin  was infiltrated with 1% lidocaine.  A 6-French arterial sheath was  placed into the right femoral artery using the modified Seldinger  technique.  Left coronary artery angiography was performed using a JL-4  pigtail catheter.  The catheter was advanced to the vessel ostium under  fluoroscopic guidance.  Digital angiography was performed in multiple  projections using hand injection of contrast.  A JR-4 guiding catheter  was then inserted through the osteum of the right coronary artery.  Digital angiography was performed quickly.  PCI of the right coronary  artery was performed.  See below for details.  After the PCI, a pigtail  catheter was advanced to the ascending aorta and across the aortic  valve.  Power injection of contrast was performed in the third degree  RAO position.  The catheter was pulled back under continuous hemodynamic  pressure monitoring.  The sheath will be removed using manual  compression.  Heparin and Integrilin were used for anticoagulation.   FINDINGS:  The left main was widely patent.  The left circumflex was  large  proximally with mild irregularities.  There was a large OM1 with a  70% long proximal lesion.  The left anterior descending was a medium-  sized vessel.  There was a 40% midvessel lesion and a large septal.  The  remainder of the left anterior descending was a small vessel which did  not really supply the apex.  There was a large branching P1 with mild  irregularities throughout.  The right coronary artery was a large vessel  which was occluded in the midportion.  There was  staining noted in the  PDA and posterolateral branch after contrast was injected on the left.  After the PCI was performed, it was apparent that the PDA extends all  the way to the apex to supply that territory, and the PLV extends well  out onto the lateral wall.   PCI NARRATIVE:  The JR-4 guiding catheter was used.  A Prowater wire was  used to cross the lesion.  Flow was restored with the wire.  A Fetch  catheter was then used to aspirate thrombus.  This did successfully  remove some thrombus.  A 2.5 x 12-mm Maverick balloon was then placed  across the tightest portion of the lesion and inflated at 8 atmospheres  for 20 seconds.  A 3.0 x 32 Liberte was then placed across the lesion  and deployed at 14 atmospheres for 40 seconds.  The stent was then post  dilated with a 3.5 x 20 mm Quantum balloon at 16 atmospheres distally  for 30 seconds and then 16 atmospheres proximally for 25 seconds.  There  was still an irregularity noted in the distal portion of the stent.  The  same balloon was then placed within the distal portion of the stent and  deployed at 18 atmospheres for 30 seconds.  There was an excellent  angiographic appearance.  There is TIMI III flow with no residual  stenosis.  Initially, at the start of the cath, there was TIMI zero flow  with 100% stenosis.   HEMODYNAMICS:  112/11 was a left ventricular pressure with an LVEDP of  27 mmHg.  Aortic pressure of 109/52 with 78 mmHg as a mean pressure.  The  left ventriculogram showed severe inferior hypokinesis with an  overall preserved LV EF of 55%.  The ventricle did appear quite small.   IMPRESSION:  1. Acute inferior ST-segment elevation MI.  The right coronary artery      was stented from the proximal to midportion with a 3.0 x 32 Liberte      stent.  This was post dilated with a 3.5 balloon.  2. 70% OM1 lesion.  3. Inferior hypokinesis with overall normal ventricular ejection      fraction; estimated EF of 55%.   RECOMMENDATIONS:  1. Will watch the patient in the CCU.  2. Continue Integrilin for 18 hours.  3. She will also be on aspirin and Plavix for at least 30 days and      hopefully longer than that.  4. Would consider PCI to the OM1 at a later time after she has      recovered from this event.  5. She will also need to establish some medical care.      Corky Crafts, MD  Electronically Signed     JSV/MEDQ  D:  06/23/2007  T:  06/24/2007  Job:  [id number]

## 2011-04-23 NOTE — Discharge Summary (Signed)
[name], [name]                ACCOUNT NO.:  [id number]   MEDICAL RECORD NO.:  [id number]          PATIENT TYPE:  INP   LOCATION:  3742                         FACILITY:  [facility]   PHYSICIAN:  Guy Franco, P.A.       DATE OF BIRTH:  1941-06-18   DATE OF ADMISSION:  06/23/2007  DATE OF DISCHARGE:  06/26/2007                               DISCHARGE SUMMARY   DISCHARGE DIAGNOSES:  1. Acute ST segment elevated myocardial infarction, inferior, status      post bare metal stent to the right coronary artery.  2. 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  3. Normal ejection fraction.  4. Hyperlipidemia, treated.  5. Smoker, smoking cessation counseling provided.  6. Long-term medication use.   HOSPITAL COURSE:  [name] is a 70 year old female who was admitted to  [facility] on June 23, 2007 with acute chest pain.  Her EKG was  consistent with an acute inferior ST segment elevated myocardial  infarction.  She was taken to the cath lab emergently and found to have  an occluded mid-RCA lesion.  Dr. Eldridge Dace then implanted a bare mental  stent into this area with good results.  She was also noted to have a  large OM-1 lesion that had a proximal 70% stenosis.  It was felt that  this lesion could be angioplastied/stented at a later time once she  recovered from the myocardial infarction.   She remained in the hospital for several days undergoing cardiac  rehabilitation and gradual therapies.   LABORATORY STUDIES:  During the patient's hospital stay include total  cholesterol of 201, triglycerides 205, HDL 28, LDL 132.  TSH 1.546.  Sodium 140, potassium 4.8, BUN 30, creatinine 1.4.  Hemoglobin 11.6,  hematocrit 34.1, white count 7.9, and platelets 163.  She had a maximum  CK of 1938 with a MB fraction of 278, a troponin of 99.4.   CONDITION ON DISCHARGE:  She was discharged to home in stable and  improved condition.   DISCHARGE MEDICATIONS:  Include:  1. Coated aspirin 325 mg 1 tablet daily.  2. Plavix 75 mg a day.  3. Metoprolol-ER 25 mg a day.  4. Chantix as directed.  5. Zocor 40 mg q.h.s.  6. Sublingual nitroglycerin p.r.n. chest pain.   DISCHARGE INSTRUCTIONS:  No driving for 2 days.  No heavy lifting over  10 pounds for 1 week.  Increase activity slowly as per cardiac  rehabilitation.  May shower or bathe, and clean cath site gently with  soap and water; no scrubbing.  Follow up with Dr. Eldridge Dace on July 10, 2007 at 10:15 a.m.      Guy Franco, P.A.     LB/MEDQ  D:  06/26/2007  T:  06/27/2007  Job:  [id number]

## 2011-05-08 ENCOUNTER — Telehealth: Payer: Self-pay | Admitting: Family Medicine

## 2011-05-08 NOTE — Telephone Encounter (Signed)
Pt advised crestor does come in a generic and to just call the pharmacy to see if she can get it. Pt agreed.Kristi [name]

## 2011-05-08 NOTE — Telephone Encounter (Signed)
Pt asking if there is a generic med for crestor that would be cheaper? If so, she would like to switch, pt goes to [facility]

## 2011-05-22 ENCOUNTER — Ambulatory Visit (INDEPENDENT_AMBULATORY_CARE_PROVIDER_SITE_OTHER): Payer: Medicare Other | Admitting: Vascular Surgery

## 2011-05-22 DIAGNOSIS — N186 End stage renal disease: Secondary | ICD-10-CM

## 2011-05-23 NOTE — Assessment & Plan Note (Signed)
OFFICE VISIT  Kristi [name], Kristi [name] DOB:  03-Apr-1941                                       05/22/2011 CHART#:[id number]  I saw the patient in the office today for follow-up after her recent left radiocephalic fistula which was placed on 04/05/2011.  She comes in for a 6-week follow-up visit.  Overall she has been doing quite well. She has had no recent uremic symptoms.  She has had no nausea, vomiting, palpitations, fatigue or anorexia.  REVIEW OF SYSTEMS:  She has had no chest pain or chest pressure.  PHYSICAL EXAMINATION:  This is a pleasant 70 year old woman who appears her stated age.  Blood pressure is 137/69, heart rate is 59.  Lungs: Clear bilaterally to auscultation.  The fistula has a palpable thrill and appears to be gradually maturing.  It is not especially pulsatile to suggest an outflow obstruction.  The incision has healed nicely.  I have recommend that I see her back in 2 months with a duplex scan to be sure that the fistula is continuing to mature.  Fortunately she reports that her creatinine has improved from 2.6 to 2.0.  However, we will continue to follow her fistula to be sure that she will have adequate access if she needs it.    Di Kindle. Edilia Bo, M.D. Electronically Signed  CSD/MEDQ  D:  05/22/2011  T:  05/23/2011  Job:  [id number]  cc:   [facility]. Eliott Nine, M.D.

## 2011-06-14 ENCOUNTER — Other Ambulatory Visit: Payer: Self-pay | Admitting: Family Medicine

## 2011-06-14 NOTE — Telephone Encounter (Signed)
Refill request

## 2011-06-27 ENCOUNTER — Encounter: Payer: Self-pay | Admitting: Vascular Surgery

## 2011-07-05 ENCOUNTER — Other Ambulatory Visit: Payer: Self-pay | Admitting: Family Medicine

## 2011-07-05 NOTE — Telephone Encounter (Signed)
Refill request

## 2011-07-08 NOTE — Telephone Encounter (Signed)
Refill request

## 2011-07-24 ENCOUNTER — Encounter: Payer: Self-pay | Admitting: Vascular Surgery

## 2011-07-24 ENCOUNTER — Ambulatory Visit (INDEPENDENT_AMBULATORY_CARE_PROVIDER_SITE_OTHER): Payer: Medicare Other | Admitting: Vascular Surgery

## 2011-07-24 VITALS — BP 134/74 | HR 74 | Ht 60.5 in | Wt 198.0 lb

## 2011-07-24 DIAGNOSIS — N186 End stage renal disease: Secondary | ICD-10-CM

## 2011-07-24 NOTE — Progress Notes (Signed)
Subjective:     Patient ID: Kristi [name], female   DOB: 1941/02/17, 70 y.o.   MRN: [id number]  HPI  This is a pleasant 70 year old woman who had a left radiocephalic fistula placed on 04/05/2011. She had a followup visit on 05/22/2011 and the vein appeared to be slowly maturing. Set her up for a followup visit today with the duplex of her pain however the duplex scan was not scheduled and therefore she has not had her duplex of her AV fistula. She states that she is not on dialysis overalls been doing quite well. She's had no recent uremic symptoms. Specifically she denies nausea, vomiting, shortness of breath, fatigue, or anorexia.  Review of Systems  Constitutional: Negative for fever and chills.  Respiratory: Negative for chest tightness and shortness of breath.        Objective:   Physical Exam  Cardiovascular: Normal rate, regular rhythm and normal pulses.  Exam reveals no friction rub.   No murmur heard. Pulmonary/Chest: She has no wheezes. She has no rales.   her left forearm AV fistula has an excellent thrill proximally but it is more difficult to follow distally. Has a brisk Doppler flow in the palmar arch of her left hand. Her incision is healed nicely.     Assessment:     I have scheduled her for a duplex of her left forearm AV fistula in 2 weeks and see her back at that time. Suspect that she might have some competing branches which may need to be ligated. Other problems are identified she might potentially require a fistulogram.    Plan:     I'll see her back after her duplex scan.

## 2011-07-31 ENCOUNTER — Other Ambulatory Visit: Payer: Self-pay | Admitting: Oncology

## 2011-07-31 ENCOUNTER — Encounter (HOSPITAL_BASED_OUTPATIENT_CLINIC_OR_DEPARTMENT_OTHER): Payer: Medicare Other | Admitting: Oncology

## 2011-07-31 DIAGNOSIS — N289 Disorder of kidney and ureter, unspecified: Secondary | ICD-10-CM

## 2011-07-31 DIAGNOSIS — E119 Type 2 diabetes mellitus without complications: Secondary | ICD-10-CM

## 2011-07-31 DIAGNOSIS — D472 Monoclonal gammopathy: Secondary | ICD-10-CM

## 2011-07-31 DIAGNOSIS — D649 Anemia, unspecified: Secondary | ICD-10-CM

## 2011-07-31 LAB — CBC WITH DIFFERENTIAL/PLATELET
BASO%: 0.5 % (ref 0.0–2.0)
Eosinophils Absolute: 0.3 10*3/uL (ref 0.0–0.5)
HCT: 31.4 % — ABNORMAL LOW (ref 34.8–46.6)
MCHC: 34.5 g/dL (ref 31.5–36.0)
MONO#: 0.8 10*3/uL (ref 0.1–0.9)
NEUT#: 5.5 10*3/uL (ref 1.5–6.5)
Platelets: 205 10*3/uL (ref 145–400)
RBC: 3.48 10*6/uL — ABNORMAL LOW (ref 3.70–5.45)
WBC: 8.3 10*3/uL (ref 3.9–10.3)
lymph#: 1.6 10*3/uL (ref 0.9–3.3)

## 2011-08-02 LAB — COMPREHENSIVE METABOLIC PANEL
ALT: 23 U/L (ref 0–35)
Albumin: 3.6 g/dL (ref 3.5–5.2)
CO2: 19 mEq/L (ref 19–32)
Calcium: 9.3 mg/dL (ref 8.4–10.5)
Chloride: 108 mEq/L (ref 96–112)
Glucose, Bld: 133 mg/dL — ABNORMAL HIGH (ref 70–99)
Sodium: 138 mEq/L (ref 135–145)
Total Protein: 6.6 g/dL (ref 6.0–8.3)

## 2011-08-02 LAB — SPEP & IFE WITH QIG
Albumin ELP: 48.2 % — ABNORMAL LOW (ref 55.8–66.1)
Alpha-1-Globulin: 6.3 % — ABNORMAL HIGH (ref 2.9–4.9)
Alpha-2-Globulin: 19.2 % — ABNORMAL HIGH (ref 7.1–11.8)
Beta Globulin: 6 % (ref 4.7–7.2)
Total Protein, Serum Electrophoresis: 6.6 g/dL (ref 6.0–8.3)

## 2011-08-02 LAB — KAPPA/LAMBDA LIGHT CHAINS
Kappa free light chain: 6.7 mg/dL — ABNORMAL HIGH (ref 0.33–1.94)
Lambda Free Lght Chn: 5.6 mg/dL — ABNORMAL HIGH (ref 0.57–2.63)

## 2011-08-04 ENCOUNTER — Other Ambulatory Visit: Payer: Self-pay | Admitting: Family Medicine

## 2011-08-04 NOTE — Telephone Encounter (Signed)
Refill request

## 2011-08-08 ENCOUNTER — Encounter: Payer: Self-pay | Admitting: Vascular Surgery

## 2011-08-10 DIAGNOSIS — I509 Heart failure, unspecified: Secondary | ICD-10-CM

## 2011-08-10 HISTORY — DX: Heart failure, unspecified: I50.9

## 2011-08-13 ENCOUNTER — Encounter: Payer: Self-pay | Admitting: Vascular Surgery

## 2011-08-14 ENCOUNTER — Encounter: Payer: Self-pay | Admitting: Vascular Surgery

## 2011-08-14 ENCOUNTER — Encounter (INDEPENDENT_AMBULATORY_CARE_PROVIDER_SITE_OTHER): Payer: Medicare Other | Admitting: Vascular Surgery

## 2011-08-14 ENCOUNTER — Ambulatory Visit (INDEPENDENT_AMBULATORY_CARE_PROVIDER_SITE_OTHER): Payer: Medicare Other | Admitting: Vascular Surgery

## 2011-08-14 ENCOUNTER — Other Ambulatory Visit (INDEPENDENT_AMBULATORY_CARE_PROVIDER_SITE_OTHER): Payer: Medicare Other | Admitting: Vascular Surgery

## 2011-08-14 VITALS — BP 119/72 | HR 66 | Temp 97.8°F | Ht 60.0 in | Wt 200.0 lb

## 2011-08-14 DIAGNOSIS — T82598A Other mechanical complication of other cardiac and vascular devices and implants, initial encounter: Secondary | ICD-10-CM

## 2011-08-14 DIAGNOSIS — T82898A Other specified complication of vascular prosthetic devices, implants and grafts, initial encounter: Secondary | ICD-10-CM

## 2011-08-14 DIAGNOSIS — N186 End stage renal disease: Secondary | ICD-10-CM

## 2011-08-14 NOTE — Progress Notes (Signed)
CC: Follow up dialysis access.  HPI: Kristi [name] is a 70 y.o. female who was last seen on 07/24/2011. She had a left radiocephalic fistula placed on 04/05/2011. She comes in today  to have a duplex scan of her fistula. Fistula has been slow to mature. She's had no specific complaints referrable to the fistula. She has had no recent uremic symptoms. Specifically she denies nausea, vomiting, anorexia, fatigue.  SOCIAL HISTORY: History  Substance Use Topics  . Smoking status: Current Some Day Smoker -- 0.2 packs/day    Types: Cigarettes    Last Attempt to Quit: 03/27/2011  . Smokeless tobacco: Never Used   Comment: Using the electronic cigarettes   . Alcohol Use: No    REVIEW OF SYSTEMS: CONSTITUTIONAL: No fever or chills CARDIOVASCULAR: No chest pain, chest pressure, palpitations, orthopnea, or dyspnea on exertion. No claudication or rest pain. No history of DVT or phlebitis.  PHYSICAL EXAM: Filed Vitals:   08/14/11 1335  BP: 119/72  Pulse: 66  Temp: 97.8 F (36.6 C)   Body mass index is 39.06 kg/(m^2).  GENERAL: The patient appears their stated age. The vital signs are documented above. Extremities: Her left forearm AV fistula has a palpable thrill although somewhat weak. PULMONARY: There is good air exchange bilaterally without wheezing or rales. NEUROLOGIC: No focal weakness or paresthesias are detected. SKIN: There are no ulcers or rashes noted.  DATA: 1. I have independently interpreted her duplex of her AV fistula. She appears to have a stenosis in the radial artery proximal to the anastomosis to the cephalic vein. There is also some increased velocities at the anastomosis.  MEDICAL ISSUES: 1. Is patient has a poorly maturing left brachiocephalic AV fistula. Ammeters of the fistula range from 0.54 cm 2.66 cm. It appears that the problem appears to be some narrowing in the radial artery proximal to the fistula and also possibly at the anastomosis. Before considering  revising this to the more proximal radial artery I think it would be worth obtaining a fistulogram to be sure that there were no other issues or any other disease in the more proximal radial artery that was not noted on duplex. Procedure has been scheduled for 09/02/2011. We'll make further recommendations pending these results.

## 2011-08-21 ENCOUNTER — Ambulatory Visit
Admission: RE | Admit: 2011-08-21 | Discharge: 2011-08-21 | Disposition: A | Discharge status: Home / Self Care | Payer: Medicare Other | Source: Ambulatory Visit | Attending: Family Medicine | Admitting: Family Medicine

## 2011-08-21 ENCOUNTER — Ambulatory Visit (INDEPENDENT_AMBULATORY_CARE_PROVIDER_SITE_OTHER): Payer: Medicare Other | Admitting: Family Medicine

## 2011-08-21 VITALS — BP 117/67 | HR 77 | Temp 98.4°F | Wt 197.0 lb

## 2011-08-21 DIAGNOSIS — R0989 Other specified symptoms and signs involving the circulatory and respiratory systems: Secondary | ICD-10-CM

## 2011-08-21 DIAGNOSIS — R0600 Dyspnea, unspecified: Secondary | ICD-10-CM | POA: Insufficient documentation

## 2011-08-21 NOTE — Patient Instructions (Addendum)
Will get xray today.  Will call you with results and will send in Rx at that time. Schedule appointment to follow-up with your new PCP- We will assign you a new resident

## 2011-08-21 NOTE — Progress Notes (Signed)
  Subjective:    Patient ID: MERLIE NOGA, female    DOB: [birth year], 70 y.o.   MRN: [id number]  HPI Here for work in appointment for SOB  Dyspnea and cough for x 1 month with phlegm.  No fevers, chills.  Overall cough and sneezing is improving with allergy medication.  Dyspnea not much improved.  Feels she gets out of breath from the house to the car, feels previously would be able to walk 3-4x that distance.  History of COPD:Smoked for 47 years, quit 3-4 years ago, started back electronic cigarettes with nicotine but notes she has not changed the cartridges in quit some time.  Takes albuterol 2-3 times day, at baseline does not have to use it.  Compliant with BID advair.  I reviewed ECHO from 2011 reviewed: normal EF, no diastolic dysfunction noted.  I have reviewed patient's  PMH, FH, and Social history and Medications as related to this visit.  Review of Systems No chest pain, edema, PND, fever, chills.    Objective:   Physical Exam GEN: Alert & Oriented, No acute distress CV:  Regular Rate & Rhythm, no murmur, no JVD Respiratory:  Normal work of breathing, mildly increased WOB on ambulation.  Crackles in bases bilaterally. Abd:  + BS, soft, no tenderness to palpation Ext: no pre-tibial edema        Assessment & Plan:

## 2011-08-21 NOTE — Assessment & Plan Note (Addendum)
1 month of cough improving, dyspnea remains.  O2 at rest 92.  Crackles in bases, no wheezes, no obvious fluid retention.  ECHO from 2011 normal.  Xray suggests fluid retention.  She had lasix on her med list- no longer taking but has 40 mg tablets at home.  Plan for careful diuresis given CKS, planning for dialysis.  Advised to take one tablet today, and one tablet tomorrow.  Needs close follow-up, will schedule for recheck, tomorrow afternoon or the next morning.  On recheck if not considerable improved, consider BNP, to help differentiate between CHF and COPD exacerbation

## 2011-08-21 NOTE — Procedures (Unsigned)
VASCULAR LAB EXAM  INDICATION:  Nonmaturing arteriovenous fistula.  HISTORY: Diabetes:  Yes. Cardiac:  Yes. Hypertension:  Yes.  EXAM:  Left upper extremity arteriovenous fistula duplex.  IMPRESSION: 1. Left arterial inflow stenosis present with a peak systolic velocity     of 513 cm/s. 2. Left arterial outflow is retrograde. 3. The left venous outflow appears patent and without any     hemodynamically significant competing branches identified.  The     venous outflow is followed in continuity to the antecubital fossa,     which is then followed via the basilic vein in the upper extremity. 4. Depths and diameters are as noted on worksheet.        ___________________________________________ Di Kindle. Edilia Bo, M.D.  SH/MEDQ  D:  08/14/2011  T:  08/14/2011  Job:  [id number]

## 2011-08-22 ENCOUNTER — Ambulatory Visit: Payer: Medicare Other | Admitting: Family Medicine

## 2011-08-23 ENCOUNTER — Encounter: Payer: Self-pay | Admitting: Family Medicine

## 2011-08-23 ENCOUNTER — Ambulatory Visit (INDEPENDENT_AMBULATORY_CARE_PROVIDER_SITE_OTHER): Payer: Medicare Other | Admitting: Family Medicine

## 2011-08-23 VITALS — BP 170/73 | HR 74 | Wt 194.0 lb

## 2011-08-23 DIAGNOSIS — R0609 Other forms of dyspnea: Secondary | ICD-10-CM

## 2011-08-23 DIAGNOSIS — R0989 Other specified symptoms and signs involving the circulatory and respiratory systems: Secondary | ICD-10-CM

## 2011-08-23 DIAGNOSIS — R0600 Dyspnea, unspecified: Secondary | ICD-10-CM

## 2011-08-23 NOTE — Assessment & Plan Note (Addendum)
Kristi [name] continues to have dyspnea.  I think it is related to volume overload. She has diuresed well in the interim. Plan to continue Lasix diuresis.  Will check a basic metabolic panel and beta natriuretic peptide today. Discussed signs or symptoms that would promote patient to go to the hospital over the weekend. Kristi [name] will followup in clinic on Monday.   Also discussed a low-salt diet. Kristi [name] expressed understanding. Please see patient instructions for more details.

## 2011-08-23 NOTE — Progress Notes (Signed)
Kristi [name] presents to clinic today to followup her dyspnea. She is about the same to perhaps mildly improved compared to the 12th.  She continues to have pretty significant dyspnea on exertion.  She has resumed her daily 40 mg of Lasix in notes pretty good diuresis.  She weighs 3 pounds less today than she did on the 12th.   She denies any chest pains or palpitations. No fevers or chills.  Her diet remains high in salt.   PMH reviewed.  ROS as above otherwise neg Medications reviewed.   Exam:  BP 170/73  Pulse 74  Wt 194 lb (87.998 kg)  SpO2 95% Gen: Well NAD, elderly appearing HEENT: EOMI,  MMM, JVD noted mid neck while sitting upright Lungs: Mildly increased work of breathing and tachypnea. Able to complete a short sentence. Bibasilar crackles, no wheezing expiratory phase is normal. Heart: RRR no MRG Abd: NABS, NT, ND Exts: Trace foot edema bilaterally none on tibia

## 2011-08-23 NOTE — Patient Instructions (Signed)
Thank you for coming in today. 1) Keep taking the furosemide daily.  2) Weigh yourself daily. I would like to see a pound or so of loss a day for a while.  If the weight goes up let us know.  3) I will get labs today.  Come back on Monday.

## 2011-08-24 LAB — BASIC METABOLIC PANEL WITH GFR
BUN: 34 mg/dL — ABNORMAL HIGH (ref 6–23)
CO2: 21 mEq/L (ref 19–32)
Chloride: 106 mEq/L (ref 96–112)
GFR, Est Non African American: 24 mL/min — ABNORMAL LOW (ref >60)
Potassium: 4.2 mEq/L (ref 3.5–5.3)

## 2011-08-26 ENCOUNTER — Ambulatory Visit (INDEPENDENT_AMBULATORY_CARE_PROVIDER_SITE_OTHER): Payer: Medicare Other | Admitting: Family Medicine

## 2011-08-26 ENCOUNTER — Encounter: Payer: Self-pay | Admitting: Family Medicine

## 2011-08-26 VITALS — BP 121/70 | HR 72 | Temp 97.8°F | Wt 189.0 lb

## 2011-08-26 DIAGNOSIS — R0609 Other forms of dyspnea: Secondary | ICD-10-CM

## 2011-08-26 DIAGNOSIS — R0989 Other specified symptoms and signs involving the circulatory and respiratory systems: Secondary | ICD-10-CM

## 2011-08-26 DIAGNOSIS — R0600 Dyspnea, unspecified: Secondary | ICD-10-CM

## 2011-08-26 LAB — BASIC METABOLIC PANEL
CO2: 23 mEq/L (ref 19–32)
Chloride: 103 mEq/L (ref 96–112)
Creat: 2.3 mg/dL — ABNORMAL HIGH (ref 0.50–1.10)
Glucose, Bld: 140 mg/dL — ABNORMAL HIGH (ref 70–99)

## 2011-08-26 NOTE — Patient Instructions (Signed)
Continue daily weights. Eat 3 meals a day- continue to restrict sodium. Continue lasix daily. My staff will call you with your appointment for the heart ultrasound- echocardiogram.  Return for recheck on Friday with Dr. Edmonia James.

## 2011-08-26 NOTE — Assessment & Plan Note (Addendum)
Dyspnea now resolved.. Cause of this dyspnea unclear. Differential includes: 1) CHF-no history of CHF, normal echo in 2011, crackles on exam last week in improvement of symptoms with Lasix support this diagnosis. 2) COPD-patient has history of this. No fever. No increased mucus production. Would not expect this to improve with Lasix. Less likely cause. 3) worsening renal function-could retain fluid if kidney function worsening, creatinine consistent with baseline. So I do not think is the cause.  I will get a cardiac echo to assess cardiac function compared to last year. I will continue Lasix and get a metabolic panel today to recheck creatinine. If any increase in creatinine will decrease Lasix. Patient to continue daily weights. Patient instructed taking 3 meals per day. Reminded her that we won't weight loss to be from loss of fluid, this to be done by reduction of sodium in diet and by taking Lasix. Will recheck patient on Friday and review results of echo. We'll discuss at that time if patient should move forward with surgery on Monday.

## 2011-08-26 NOTE — Progress Notes (Signed)
  Subjective:    Patient ID: Kristi [name], female    DOB: 1940-12-12, 70 y.o.   MRN: [id number]  HPI Dyspnea followup: Patient states she feels much better. Shortness of breath now improved since Friday. No fever. Positive cough with small amount of white phlegm. No wheezing. Stay she has been checking daily weights. Has also been dieting to help decrease weight, since Dr. Denyse Amass said goal was to decrease weight by 1 pound per day. BNP at last visit was 372. Creatinine 2.01 (patient baseline 1.9). Patient has been taking Lasix 40 mg daily as directed. Patient has been doing daily weights and has noticed weight loss. Weight at Friday appointment was 194 pounds, today's appointment was 189 pounds, so down 6 pounds. Patient states that she has an operation on her left arm fistula scheduled for next Monday, 09/02/2011. She wants a note to we'll to go forward with this surgery.  Social history: Patient quit smoking 6 months ago.  Review of Systems As per above    Objective:   Physical Exam  Constitutional: She is oriented to person, place, and time. She appears well-developed and well-nourished.       Smiling, laughing  HENT:  Head: Normocephalic and atraumatic.  Cardiovascular: Normal rate, regular rhythm and normal heart sounds.   No murmur heard. Pulmonary/Chest: Effort normal and breath sounds normal. No respiratory distress. She has no wheezes. She has no rales.       No crackles present  Abdominal: Soft. She exhibits no distension. There is no tenderness. There is no rebound.  Musculoskeletal: She exhibits no edema.       No sacral edema  Neurological: She is alert and oriented to person, place, and time.  Skin: No rash noted.  Psychiatric: She has a normal mood and affect. Her behavior is normal.          Assessment & Plan:

## 2011-08-27 ENCOUNTER — Telehealth: Payer: Self-pay | Admitting: Family Medicine

## 2011-08-27 NOTE — Telephone Encounter (Signed)
Pt's creatinine increased from 2.0 to 2.3 on recheck of bmet this week.  Most likely this is due to diuresis with lasix.  Pt called and she states that she is feeling even better than she did when I saw her earlier this week.  No significant increase in weight.  Doing daily weights.  Pt to continue daily weights.  Pt to decrease lasix dose to 20mg  daily.  Will recheck pt on Friday.

## 2011-08-28 ENCOUNTER — Ambulatory Visit (HOSPITAL_COMMUNITY)
Admission: RE | Admit: 2011-08-28 | Discharge: 2011-08-28 | Disposition: A | Discharge status: Home / Self Care | Payer: Medicare Other | Source: Ambulatory Visit | Attending: Family Medicine | Admitting: Family Medicine

## 2011-08-28 DIAGNOSIS — I079 Rheumatic tricuspid valve disease, unspecified: Secondary | ICD-10-CM | POA: Insufficient documentation

## 2011-08-28 DIAGNOSIS — E785 Hyperlipidemia, unspecified: Secondary | ICD-10-CM | POA: Insufficient documentation

## 2011-08-28 DIAGNOSIS — R0609 Other forms of dyspnea: Secondary | ICD-10-CM | POA: Insufficient documentation

## 2011-08-28 DIAGNOSIS — I059 Rheumatic mitral valve disease, unspecified: Secondary | ICD-10-CM | POA: Insufficient documentation

## 2011-08-28 DIAGNOSIS — E119 Type 2 diabetes mellitus without complications: Secondary | ICD-10-CM | POA: Insufficient documentation

## 2011-08-28 DIAGNOSIS — I10 Essential (primary) hypertension: Secondary | ICD-10-CM | POA: Insufficient documentation

## 2011-08-28 DIAGNOSIS — R0989 Other specified symptoms and signs involving the circulatory and respiratory systems: Secondary | ICD-10-CM | POA: Insufficient documentation

## 2011-08-28 DIAGNOSIS — I379 Nonrheumatic pulmonary valve disorder, unspecified: Secondary | ICD-10-CM | POA: Insufficient documentation

## 2011-08-30 ENCOUNTER — Other Ambulatory Visit: Payer: Self-pay | Admitting: Family Medicine

## 2011-08-30 ENCOUNTER — Ambulatory Visit (INDEPENDENT_AMBULATORY_CARE_PROVIDER_SITE_OTHER): Payer: Medicare Other | Admitting: Family Medicine

## 2011-08-30 ENCOUNTER — Encounter: Payer: Self-pay | Admitting: Family Medicine

## 2011-08-30 VITALS — BP 112/64 | HR 68 | Wt 188.5 lb

## 2011-08-30 DIAGNOSIS — E119 Type 2 diabetes mellitus without complications: Secondary | ICD-10-CM

## 2011-08-30 DIAGNOSIS — R0600 Dyspnea, unspecified: Secondary | ICD-10-CM

## 2011-08-30 DIAGNOSIS — R0609 Other forms of dyspnea: Secondary | ICD-10-CM

## 2011-08-30 NOTE — Patient Instructions (Signed)
Pt to return for f/up in 2-3 weeks.

## 2011-08-30 NOTE — Progress Notes (Addendum)
  Subjective:    Patient ID: Kristi [name], female    DOB: 1941/08/15, 70 y.o.   MRN: [id number]  HPI Dyspnea followup: Patient states she feels much better. Has improved daily since last visit. Feels she is back to baseline. Cardiac echo showed EF of 55-60%. Patient now taking 20 mg of Lasix daily. Has continued to see modest decrease in weight. No weight increase. Minimal cough. Minimal mucus production. No fever. No CP.  No SOB.    Review of Systems    as per above Objective:   Physical Exam  Constitutional: She is oriented to person, place, and time. She appears well-developed and well-nourished.  HENT:  Head: Normocephalic and atraumatic.  Cardiovascular: Normal rate, regular rhythm and normal heart sounds.   No murmur heard. Pulmonary/Chest: Effort normal and breath sounds normal. No respiratory distress. She has no wheezes. She has no rales.       No crackles.  Musculoskeletal: She exhibits no edema.  Neurological: She is alert and oriented to person, place, and time.  Skin: No rash noted.  Psychiatric: She has a normal mood and affect. Her behavior is normal.          Assessment & Plan:

## 2011-08-30 NOTE — Telephone Encounter (Signed)
Refill request

## 2011-08-30 NOTE — Assessment & Plan Note (Signed)
Now resolved. Will follow patient closely. Echo obtained on 9/19 showed diastolic dysfunction grade 2. EF 55-60%. Calcified annulus of mitral valve. Mild dilatation of left atrium. moderate tricuspid regurg. Per Elonda Husky criteria patient is at increased risk when undergoing surgery due to baseline issues such as diabetes, preoperative serum creatinine greater than 2, and history of heart failure. Yet, Since patient is back to baseline I think she can go forward with her surgery that is planned to correct her left arm dialysis access.    I will copy this note to her surgeon Dr. Edilia Bo, so he can be aware of episode of dyspnea that occurred last week, and decide if he would like to go forward with the procedure.

## 2011-09-02 ENCOUNTER — Ambulatory Visit (HOSPITAL_COMMUNITY)
Admission: RE | Admit: 2011-09-02 | Discharge: 2011-09-02 | Disposition: A | Discharge status: Home / Self Care | Payer: Medicare Other | Source: Ambulatory Visit | Attending: Vascular Surgery | Admitting: Vascular Surgery

## 2011-09-02 DIAGNOSIS — Y849 Medical procedure, unspecified as the cause of abnormal reaction of the patient, or of later complication, without mention of misadventure at the time of the procedure: Secondary | ICD-10-CM | POA: Insufficient documentation

## 2011-09-02 DIAGNOSIS — T82898A Other specified complication of vascular prosthetic devices, implants and grafts, initial encounter: Secondary | ICD-10-CM

## 2011-09-02 DIAGNOSIS — T82598A Other mechanical complication of other cardiac and vascular devices and implants, initial encounter: Secondary | ICD-10-CM | POA: Insufficient documentation

## 2011-09-02 LAB — POCT I-STAT, CHEM 8
Calcium, Ion: 1.3 mmol/L (ref 1.12–1.32)
Chloride: 108 mEq/L (ref 96–112)
Creatinine, Ser: 2.5 mg/dL — ABNORMAL HIGH (ref 0.50–1.10)
Glucose, Bld: 111 mg/dL — ABNORMAL HIGH (ref 70–99)
Hemoglobin: 11.6 g/dL — ABNORMAL LOW (ref 12.0–15.0)
Potassium: 4 mEq/L (ref 3.5–5.1)

## 2011-09-03 NOTE — Op Note (Signed)
  [name], Kristi [name]                ACCOUNT NO.:  [id number]  MEDICAL RECORD NO.:  [id number]  LOCATION:  [facility]                         FACILITY:  [facility]  PHYSICIAN:  Di Kindle. Edilia Bo, M.D.DATE OF BIRTH:  [birth year]  DATE OF PROCEDURE:  09/02/2011 DATE OF DISCHARGE:                              OPERATIVE REPORT   SURGEON:  Di Kindle. Edilia Bo, MD  PREOPERATIVE DIAGNOSIS:  Poorly maturing left forearm arteriovenous fistula.  POSTOPERATIVE DIAGNOSIS:  Poorly maturing left forearm arteriovenous fistula.  PROCEDURE: 1. Ultrasound-guided access to left AV fistula. 2. Fistulogram of left radiocephalic AV fistula.  TECHNIQUE:  The patient was taken to the [facility] Lab and the left arm was prepped and draped in usual sterile fashion.  Under ultrasound guidance, the left AV fistula was cannulated after the skin was anesthetized with a micropuncture set.  The micropuncture sheath was introduced.  Using half-strength contrast, a fistulogram was obtained.  I then compressed the fistula proximally under direct duplex vision, and a retrograde filling of the proximal fistula and radial artery were demonstrated.  A total of 10 mL of contrast was used.  FINDINGS:  The left radiocephalic AV fistula is widely patent with one small competing branch in the midforearm and then just below the antecubital level of the cephalic vein empties into the basilic vein with a smaller branch of representing the upper arm cephalic vein, which is fairly small.  On retrograde filling of the fistula, there was no stenosis within the proximal fistula.  The radial artery is very small throughout, but no focal stenosis identified in the radial artery.  This patient has been instructed to continue to excise the fistula and if this does not mature adequately on follow up in 6 weeks, we will schedule her for basilic transposition, which seems to be a next logical option.  I do not think ligation of the small  competing branch of the arm would have a significant impact on the performance of the fistula.  At the completion of the procedure, a Monocryl suture was placed and the sheath removed and pressure held for hemostasis.  No immediate complications were noted.     Di Kindle. Edilia Bo, M.D.     CSD/MEDQ  D:  09/02/2011  T:  09/02/2011  Job:  [id number]  cc:   [facility]  Electronically Signed by Waverly Ferrari M.D. on 09/03/2011 01:57:15 PM

## 2011-09-04 ENCOUNTER — Ambulatory Visit (HOSPITAL_COMMUNITY): Payer: Medicare Other

## 2011-09-20 ENCOUNTER — Ambulatory Visit (INDEPENDENT_AMBULATORY_CARE_PROVIDER_SITE_OTHER): Payer: Medicare Other | Admitting: Family Medicine

## 2011-09-20 DIAGNOSIS — I10 Essential (primary) hypertension: Secondary | ICD-10-CM

## 2011-09-20 DIAGNOSIS — E785 Hyperlipidemia, unspecified: Secondary | ICD-10-CM

## 2011-09-20 DIAGNOSIS — E669 Obesity, unspecified: Secondary | ICD-10-CM

## 2011-09-20 DIAGNOSIS — R600 Localized edema: Secondary | ICD-10-CM

## 2011-09-20 DIAGNOSIS — F17200 Nicotine dependence, unspecified, uncomplicated: Secondary | ICD-10-CM

## 2011-09-20 DIAGNOSIS — R609 Edema, unspecified: Secondary | ICD-10-CM

## 2011-09-20 DIAGNOSIS — E119 Type 2 diabetes mellitus without complications: Secondary | ICD-10-CM

## 2011-09-20 DIAGNOSIS — N183 Chronic kidney disease, stage 3 unspecified: Secondary | ICD-10-CM

## 2011-09-20 NOTE — Assessment & Plan Note (Signed)
Continue current medications-Glucotrol and Lantus. Encouraged dental exam. Patient to follow up for eye appointment as scheduled. Need to do complete foot exam at next appointment.

## 2011-09-20 NOTE — Patient Instructions (Signed)
Return to see me in 1-2 months Think of ways to increase your movement/exercise.

## 2011-09-20 NOTE — Assessment & Plan Note (Signed)
Patient due for lipid recheck-had labs drawn at renal appointment. We'll wait for results in consult note. His lipid panel not drawn we'll draw a next appointment. Next appointment scheduled for November 2012.

## 2011-09-20 NOTE — Assessment & Plan Note (Signed)
Managed by Dr. Warrick [facility]. Had recent visits and lab draw. I have not received these records. Currently not needing dialysis but does have fistula site. Has fistula access left forearm-being seen by Dr. Edilia Bo because not developing well. May need to have modification of this fistula. Next appointment with Dr. Eliott Nine in December 2012. Next appointment with Dr. Edilia Bo at the end of October 2012.

## 2011-09-20 NOTE — Assessment & Plan Note (Signed)
Patient using electronic cigarette 2 times per day, sometimes does not put in nicotine cartridge-does not smoke any tobacco currently. Encouraged continued smoking cessation.

## 2011-09-20 NOTE — Assessment & Plan Note (Signed)
Will follow patient closely. Patient doing daily weights. Patient edema most likely due to a combination of factors including impaired renal function, obesity and decreased mobility/exercise. Will continue Lasix at current dose-20 mg daily. Patient to bring medications to next appointment for me to do formal review in that reconciliation

## 2011-09-20 NOTE — Assessment & Plan Note (Signed)
Patient to consider increasing exercise. Encouraged patient to walk to the mailbox daily. Currently not exercising at all-driving to Mailbox.

## 2011-09-20 NOTE — Assessment & Plan Note (Signed)
Well controlled on current regimen-Norvasc 10 mg daily, Lasix 20 mg daily, Lopressor 25 mg twice a day. Patient to bring medications next appointment.

## 2011-09-20 NOTE — Progress Notes (Signed)
  Subjective:    Patient ID: Kristi [name], female    DOB: [birth year], 70 y.o.   MRN: [id number]  HPI Diabetes: Taking Glucotrol 5 mg by mouth daily. Lantus 13 units daily Checking blood sugar every morning lowest, during the past 2 weeks, 88- highest 180. Up-to-date on eye exam Dr. Princella Pellegrini October 2012. Having left eye cataract removal next week. Dental exam-has not had for years, due to cost. A1c in September 2012- 6.5  Lower extremity edema: Continues to have pedal edema off and on. Patient states well controlled with Lasix. Doesn't r remember how much Lasix she is taking currently. She did not bring medications to visit today. Patient is taking daily weights, and denies any fluctuations in weight. Patient is followed by Dr. Eldridge Dace.  Hypertension: Patient states she is taking all medications. Did not bring medicines to today's exam. Blood pressure 139/71 today.  Tobacco use: Patient using electronic cigarette 2 times per day, occasionally uses without nicotine cartridge. No cigarette use.  Hyperlipidemia: Recently had labs drawn at renal physician office. May have had lipid checked-per patient. I have not received records.  Obesity: Patient states that she is not walking to the mailbox. Patient usually will pick up mail in car. Does walk in-house, but does not do any regular exercise.   Review of Systems As per above    Objective:   Physical Exam  Constitutional: She is oriented to person, place, and time. She appears well-developed and well-nourished.       Obese  HENT:  Head: Normocephalic and atraumatic.  Neck: Normal range of motion.  Cardiovascular: Normal rate, regular rhythm and normal heart sounds.   No murmur heard. Pulmonary/Chest: Effort normal. No respiratory distress. She has no wheezes.  Abdominal: Soft. She exhibits no distension. There is no tenderness.  Musculoskeletal: She exhibits edema.       2+ pedal edema, trace ankle edema.    Neurological: She is alert and oriented to person, place, and time.  Skin: No rash noted.  Psychiatric: She has a normal mood and affect. Her behavior is normal.          Assessment & Plan:

## 2011-09-23 LAB — I-STAT 8, (EC8 V) (CONVERTED LAB)
BUN: 28 — ABNORMAL HIGH
Chloride: 107
Glucose, Bld: 212 — ABNORMAL HIGH
Potassium: 4
pCO2, Ven: 33 — ABNORMAL LOW
pH, Ven: 7.358 — ABNORMAL HIGH

## 2011-09-23 LAB — BASIC METABOLIC PANEL
BUN: 23
CO2: 26
CO2: 21
Calcium: 9.1
Calcium: 9
Calcium: 9.4
Chloride: 108
Chloride: 110
Creatinine, Ser: 1.31 — ABNORMAL HIGH
Creatinine, Ser: 1.33 — ABNORMAL HIGH
Creatinine, Ser: 1.35 — ABNORMAL HIGH
Creatinine, Ser: 1.49 — ABNORMAL HIGH
GFR calc Af Amer: 49 — ABNORMAL LOW
GFR calc Af Amer: 42 — ABNORMAL LOW
GFR calc Af Amer: 47 — ABNORMAL LOW
GFR calc Af Amer: 48 — ABNORMAL LOW
GFR calc non Af Amer: 35 — ABNORMAL LOW
GFR calc non Af Amer: 39 — ABNORMAL LOW
Glucose, Bld: 125 — ABNORMAL HIGH
Potassium: 4.7
Sodium: 135
Sodium: 137

## 2011-09-23 LAB — COMPREHENSIVE METABOLIC PANEL
AST: 46 — ABNORMAL HIGH
Albumin: 2.7 — ABNORMAL LOW
Alkaline Phosphatase: 96
Chloride: 109
Creatinine, Ser: 1.4 — ABNORMAL HIGH
GFR calc Af Amer: 45 — ABNORMAL LOW
Potassium: 4.8
Total Bilirubin: 0.5
Total Protein: 5.6 — ABNORMAL LOW

## 2011-09-23 LAB — CBC
HCT: 34.8 — ABNORMAL LOW
Hemoglobin: 11.6 — ABNORMAL LOW
Hemoglobin: 12.6
MCHC: 33.8
MCHC: 34.2
Platelets: 169
RBC: 3.87
RBC: 4.21
RBC: 4.29
RDW: 13.2
RDW: 13.9
WBC: 7.5
WBC: 8.9
WBC: 9.4
WBC: 9.7

## 2011-09-23 LAB — CK TOTAL AND CKMB (NOT AT ARMC)
CK, MB: 278.1 — ABNORMAL HIGH
CK, MB: 4
Relative Index: 3.8 — ABNORMAL HIGH
Relative Index: 5 — ABNORMAL HIGH
Total CK: 106
Total CK: 1938 — ABNORMAL HIGH

## 2011-09-23 LAB — POCT CARDIAC MARKERS
Myoglobin, poc: 158
Operator id: 189501
Troponin i, poc: <0.05

## 2011-09-23 LAB — APTT: aPTT: 25

## 2011-09-23 LAB — DIFFERENTIAL
Basophils Absolute: 0.1
Basophils Relative: 1
Eosinophils Absolute: 0.1
Monocytes Relative: 7
Neutro Abs: 4.3
Neutrophils Relative %: 58

## 2011-09-23 LAB — PROTIME-INR
INR: 1
Prothrombin Time: 13.5

## 2011-09-23 LAB — TSH: TSH: 1.546

## 2011-09-23 LAB — TROPONIN I: Troponin I: 99.74

## 2011-10-15 ENCOUNTER — Encounter: Payer: Self-pay | Admitting: Vascular Surgery

## 2011-10-16 ENCOUNTER — Ambulatory Visit (INDEPENDENT_AMBULATORY_CARE_PROVIDER_SITE_OTHER): Payer: Medicare Other | Admitting: Vascular Surgery

## 2011-10-16 ENCOUNTER — Ambulatory Visit (INDEPENDENT_AMBULATORY_CARE_PROVIDER_SITE_OTHER): Payer: Medicare Other | Admitting: *Deleted

## 2011-10-16 ENCOUNTER — Encounter: Payer: Self-pay | Admitting: Vascular Surgery

## 2011-10-16 VITALS — BP 130/58 | HR 55 | Ht 60.5 in | Wt 195.0 lb

## 2011-10-16 DIAGNOSIS — Z48812 Encounter for surgical aftercare following surgery on the circulatory system: Secondary | ICD-10-CM

## 2011-10-16 DIAGNOSIS — N186 End stage renal disease: Secondary | ICD-10-CM

## 2011-10-16 DIAGNOSIS — N189 Chronic kidney disease, unspecified: Secondary | ICD-10-CM

## 2011-10-16 NOTE — Progress Notes (Signed)
[facility] [place]  Patient name: Kristi [name] MRN: [id number] DOB: [birth year] Sex: female  CC: followup of AV fistula left arm  HPI: Kristi [name] is a 70 y.o. female who had a left radiocephalic fistula placed on 04/05/2011. This was slow to mature or so she subsequently underwent a fistulogram proximally 6 weeks ago which really did not show any significant problems with the fistula accepted the vein was somewhat small. She did have one small competing branches but it was not felt that this would significantly impact flow to ligate this. Comes in for a routine followup visit. She's had no pain in the arm. She's had no uremic symptoms. Specifically she denies nausea, vomiting, anorexia, and fatigue.  Past Medical History  Diagnosis Date  . Myocardial infarction   . Diabetes mellitus   . Stroke   . Chronic kidney disease   . COPD (chronic obstructive pulmonary disease)   . Hyperlipidemia   . Hypertension   . Anemia   . CAD (coronary artery disease)   . Hyperparathyroidism     Family History  Problem Relation Age of Onset  . Cancer Mother     multiple myloma  . Dementia Father   . Pneumonia Father   . Anuerysm Father     SOCIAL HISTORY: History  Substance Use Topics  . Smoking status: Former Smoker -- 0.2 packs/day for 47 years    Types: Cigarettes    Quit date: 03/27/2011  . Smokeless tobacco: Never Used  . Alcohol Use: No    No Known Allergies  Current Outpatient Prescriptions  Medication Sig Dispense Refill  . ADVAIR DISKUS 250-50 MCG/DOSE AEPB INHALE 1 PUFF TWICE A DAY  1 each  2  . albuterol (PROVENTIL HFA) 108 (90 BASE) MCG/ACT inhaler Inhale 2 puffs into the lungs every 4 (four) hours as needed. For wheezing       . alendronate (FOSAMAX) 70 MG tablet Take 70 mg by mouth every 7 (seven) days. Take in the morning with a full glass of water, on an empty stomach, and do not take anything else by mouth or lie down for the next 30 min.         Marland Kitchen amLODipine (NORVASC) 10 MG tablet ONE BY MOUTH DAILY  30 tablet  3  . aspirin 325 MG tablet Take 325 mg by mouth daily.        . calcium-vitamin D 250-100 MG-UNIT per tablet Take 1 tablet by mouth 2 (two) times daily.        . fexofenadine (ALLEGRA) 180 MG tablet Take 180 mg by mouth daily.        . fish oil-omega-3 fatty acids 1000 MG capsule Take 1,200 mg by mouth 6 (six) times daily.        . furosemide (LASIX) 40 MG tablet 40 mg 1 dose over 46 hours.      Marland Kitchen glipiZIDE (GLUCOTROL XL) 5 MG 24 hr tablet TAKE 1 TABLET BY MOUTH EVERY DAY  30 tablet  0  . glipiZIDE (GLUCOTROL XL) 5 MG 24 hr tablet TAKE 1 TABLET BY MOUTH EVERY DAY  30 tablet  5  . glucosamine-chondroitin 500-400 MG tablet Take 1 tablet by mouth 2 (two) times daily.        Marland Kitchen glucose blood (ONE TOUCH TEST STRIPS) test strip Check blood sugar 4 times daily.      . insulin glargine (LANTUS SOLOSTAR) 100 UNIT/ML injection Inject 13 Units into the skin every morning.       Marland Kitchen  Insulin Pen Needle 31G X 8 MM MISC Test sugars 4 times a day      . LIFESCAN FINEPOINT LANCETS MISC by Does not apply route. Check blood sugars four times a day       . metoprolol (LOPRESSOR) 25 MG tablet Take 25 mg by mouth 2 (two) times daily.        . nitroGLYCERIN (NITROSTAT) 0.4 MG SL tablet Place 0.4 mg under the tongue every 5 (five) minutes as needed. Call 911 if pain not relieved by 3rd tab       . omeprazole (PRILOSEC) 20 MG capsule ONE BY MOUTH DAILY  90 capsule  3  . rosuvastatin (CRESTOR) 20 MG tablet Take 1 tablet (20 mg total) by mouth at bedtime.  30 tablet  11  . vitamin B-12 (CYANOCOBALAMIN) 1000 MCG tablet Take 1,000 mcg by mouth daily.        . calcitRIOL (ROCALTROL) 0.25 MCG capsule 0.25 mcg 2 (two) times a week.        REVIEW OF SYSTEMS: Arly.Keller ] denotes positive finding; [  ] denotes negative finding CARDIOVASCULAR:  [ ]  chest pain   [ ]  chest pressure   [ ]  palpitations   [ ]  orthopnea   [ ]  dyspnea on exertion   [ ]  claudication   [ ]  rest  pain   [ ]  DVT   [ ]  phlebitis PULMONARY:   [ ]  productive cough   [ ]  asthma   [ ]  wheezing NEUROLOGIC:   [ ]  weakness  [ ]  paresthesias  [ ]  aphasia  [ ]  amaurosis  [ ]  dizziness HEMATOLOGIC:   [ ]  bleeding problems   [ ]  clotting disorders MUSCULOSKELETAL:  [ ]  joint pain   [ ]  joint swelling GASTROINTESTINAL: [ ]   blood in stool  [ ]   hematemesis GENITOURINARY:  [ ]   dysuria  [ ]   hematuria PSYCHIATRIC:  [ ]  history of major depression INTEGUMENTARY:  [ ]  rashes  [ ]  ulcers CONSTITUTIONAL:  [ ]  fever   [ ]  chills  PHYSICAL EXAM: Filed Vitals:   10/16/11 1141  BP: 130/58  Pulse: 55  Height: 5' 0.5" (1.537 m)  Weight: 195 lb (88.451 kg)  SpO2: 96%   Body mass index is 37.46 kg/(m^2). GENERAL: The patient is a well-nourished female, in no acute distress. The vital signs are documented above. CARDIOVASCULAR: There is a regular rate and rhythm without significant murmur appreciated.  PULMONARY: There is good air exchange bilaterally without wheezing or rales. Her fistula has a good thrill proximally but it is difficult to follow in the upper half of the forearm on the left. Is not especially pulsatile to suggest an outflow stenosis. He has a palpable radial pulse.  DATA:  Have reviewed her previous fistulogram which shows that the cephalic vein appears reasonable in size although somewhat small. His 1 competing branches in the mid forearm. The cephalic vein branches into the basilic system which is larger than the upper arm cephalic vein on the left.  I did independently interpreted her duplex of her fistula today which shows that the diameters in the form of her fistula range from 0.53 to  .58 cm thus the vein is slightly small. The upper arm cephalic vein has diameters ranging from 0.39  to  .49 cm.  MEDICAL ISSUES: As this fistula appears to have a reasonable thrill up to approximately the level of the branch it may be that the branch is gradually enlarged  in size. This reason I  think it would be reasonable to ligate this 1 competing branches to see if this helps the more central fistula mature. Has been scheduled for 10/22/2011.  Ester Mabe S [facility] [place] Office: [phone]

## 2011-10-17 ENCOUNTER — Telehealth: Payer: Self-pay | Admitting: *Deleted

## 2011-10-17 NOTE — Telephone Encounter (Signed)
Pt called to say she forgot she had cataract surgery planned for 10/21/11 and would it be ok to go ahead with our surgery for ligation competing branch of AVF on 10/22/11. Talked with Gene at Dr Darel Hong' office and he said they would see pt on the 13th at 7am then we could do surgery. Talked with the pt and to decrease her stress we decided to do her surgery 10/29/11.

## 2011-10-18 ENCOUNTER — Other Ambulatory Visit: Payer: Self-pay | Admitting: *Deleted

## 2011-10-23 ENCOUNTER — Encounter (HOSPITAL_COMMUNITY): Payer: Self-pay

## 2011-10-23 ENCOUNTER — Encounter (HOSPITAL_COMMUNITY)
Admission: RE | Admit: 2011-10-23 | Discharge: 2011-10-23 | Disposition: A | Discharge status: Home / Self Care | Payer: Medicare Other | Source: Ambulatory Visit | Attending: Vascular Surgery | Admitting: Vascular Surgery

## 2011-10-23 ENCOUNTER — Encounter (HOSPITAL_COMMUNITY)
Admission: RE | Admit: 2011-10-23 | Discharge: 2011-10-23 | Disposition: A | Discharge status: Home / Self Care | Payer: Medicare Other | Source: Ambulatory Visit | Attending: Anesthesiology | Admitting: Anesthesiology

## 2011-10-23 HISTORY — DX: Gastro-esophageal reflux disease without esophagitis: K21.9

## 2011-10-23 HISTORY — DX: Unspecified osteoarthritis, unspecified site: M19.90

## 2011-10-23 HISTORY — DX: Heart failure, unspecified: I50.9

## 2011-10-23 LAB — DIFFERENTIAL
Eosinophils Absolute: 0.3 10*3/uL (ref 0.0–0.7)
Eosinophils Relative: 4 % (ref 0–5)
Lymphocytes Relative: 29 % (ref 12–46)
Lymphs Abs: 2.1 10*3/uL (ref 0.7–4.0)
Monocytes Absolute: 0.7 10*3/uL (ref 0.1–1.0)
Monocytes Relative: 9 % (ref 3–12)

## 2011-10-23 LAB — CBC
HCT: 36.6 % (ref 36.0–46.0)
MCH: 28.4 pg (ref 26.0–34.0)
MCV: 86.5 fL (ref 78.0–100.0)
RBC: 4.23 MIL/uL (ref 3.87–5.11)
WBC: 7.4 10*3/uL (ref 4.0–10.5)

## 2011-10-23 LAB — SURGICAL PCR SCREEN: Staphylococcus aureus: POSITIVE — AB

## 2011-10-23 LAB — BASIC METABOLIC PANEL
CO2: 25 mEq/L (ref 19–32)
Chloride: 102 mEq/L (ref 96–112)
Glucose, Bld: 94 mg/dL (ref 70–99)
Sodium: 139 mEq/L (ref 135–145)

## 2011-10-23 NOTE — Progress Notes (Signed)
Stress test, cardiac cath and last office notes requested from Dr. Hoyle Barr office by Beatriz Stallion, RN

## 2011-10-23 NOTE — Procedures (Unsigned)
VASCULAR LAB EXAM  INDICATION:  A 6-week followup.  HISTORY: Diabetes: Cardiac: Hypertension:  EXAM:  Left upper extremity AV fistula duplex.  IMPRESSION: 1. Patent left arm radiocephalic arteriovenous fistula with no     significant increase in velocities noted. 2. The left radial artery demonstrates retrograde flow. 3. Depth, diameter, velocity, and patent branch diameter measurements     are noted on attached worksheet.  ___________________________________________ Di Kindle. Edilia Bo, M.D.  CH/MEDQ  D:  10/16/2011  T:  10/16/2011  Job:  [id number]

## 2011-10-23 NOTE — Pre-Procedure Instructions (Signed)
20 Kristi [name]  10/23/2011   Your procedure is scheduled on: November 20  Report to [facility] at 7:30 AM.  Call this number if you have problems the morning of surgery: [phone]   Remember:   Do not eat food:After Midnight.  Do not drink clear liquids: 4 Hours before arrival.  Take these medicines the morning of surgery with A SIP OF WATER: Advair, Proventil inhaler (bring to surgery), Amlodipine, Metoprolol, Omeprazole, eye drops   Do not wear jewelry, make-up or nail polish.  Do not wear lotions, powders, or perfumes. You may wear deodorant.  Do not shave 48 hours prior to surgery.  Do not bring valuables to the hospital.  Contacts, dentures or bridgework may not be worn into surgery.  Leave suitcase in the car. After surgery it may be brought to your room.  For patients admitted to the hospital, checkout time is 11:00 AM the day of discharge.   Patients discharged the day of surgery will not be allowed to drive home.  Name and phone number of your driver: [name] [phone]    Special Instructions: CHG Shower Use Special Wash: 1/2 bottle night before surgery and 1/2 bottle morning of surgery.   Please read over the following fact sheets that you were given: Pain Booklet, Coughing and Deep Breathing and Surgical Site Infection Prevention

## 2011-10-28 MED ORDER — DEXTROSE 5 % IV SOLN
1.5000 g | Freq: Once | INTRAVENOUS | Status: AC
  Administered 2011-10-29: 1.5 g via INTRAVENOUS
  Filled 2011-10-28: qty 1.5

## 2011-10-29 ENCOUNTER — Ambulatory Visit: Payer: Medicare Other | Admitting: Family Medicine

## 2011-10-29 ENCOUNTER — Encounter (HOSPITAL_COMMUNITY): Payer: Self-pay | Admitting: Certified Registered"

## 2011-10-29 ENCOUNTER — Encounter (HOSPITAL_COMMUNITY)
Admission: RE | Disposition: A | Discharge status: Home / Self Care | Payer: Self-pay | Source: Ambulatory Visit | Attending: Vascular Surgery

## 2011-10-29 ENCOUNTER — Ambulatory Visit (HOSPITAL_COMMUNITY)
Admission: RE | Admit: 2011-10-29 | Discharge: 2011-10-29 | Disposition: A | Discharge status: Home / Self Care | Payer: Medicare Other | Source: Ambulatory Visit | Attending: Vascular Surgery | Admitting: Vascular Surgery

## 2011-10-29 ENCOUNTER — Ambulatory Visit (HOSPITAL_COMMUNITY): Payer: Medicare Other | Admitting: Certified Registered"

## 2011-10-29 ENCOUNTER — Encounter (HOSPITAL_COMMUNITY): Payer: Self-pay | Admitting: *Deleted

## 2011-10-29 DIAGNOSIS — T82898A Other specified complication of vascular prosthetic devices, implants and grafts, initial encounter: Secondary | ICD-10-CM

## 2011-10-29 DIAGNOSIS — I509 Heart failure, unspecified: Secondary | ICD-10-CM | POA: Insufficient documentation

## 2011-10-29 DIAGNOSIS — I120 Hypertensive chronic kidney disease with stage 5 chronic kidney disease or end stage renal disease: Secondary | ICD-10-CM | POA: Insufficient documentation

## 2011-10-29 DIAGNOSIS — K219 Gastro-esophageal reflux disease without esophagitis: Secondary | ICD-10-CM | POA: Insufficient documentation

## 2011-10-29 DIAGNOSIS — J4489 Other specified chronic obstructive pulmonary disease: Secondary | ICD-10-CM | POA: Insufficient documentation

## 2011-10-29 DIAGNOSIS — Z8673 Personal history of transient ischemic attack (TIA), and cerebral infarction without residual deficits: Secondary | ICD-10-CM | POA: Insufficient documentation

## 2011-10-29 DIAGNOSIS — N186 End stage renal disease: Secondary | ICD-10-CM | POA: Insufficient documentation

## 2011-10-29 DIAGNOSIS — I2510 Atherosclerotic heart disease of native coronary artery without angina pectoris: Secondary | ICD-10-CM | POA: Insufficient documentation

## 2011-10-29 DIAGNOSIS — J449 Chronic obstructive pulmonary disease, unspecified: Secondary | ICD-10-CM | POA: Insufficient documentation

## 2011-10-29 DIAGNOSIS — T82598A Other mechanical complication of other cardiac and vascular devices and implants, initial encounter: Secondary | ICD-10-CM | POA: Insufficient documentation

## 2011-10-29 DIAGNOSIS — E119 Type 2 diabetes mellitus without complications: Secondary | ICD-10-CM | POA: Insufficient documentation

## 2011-10-29 DIAGNOSIS — Y832 Surgical operation with anastomosis, bypass or graft as the cause of abnormal reaction of the patient, or of later complication, without mention of misadventure at the time of the procedure: Secondary | ICD-10-CM | POA: Insufficient documentation

## 2011-10-29 DIAGNOSIS — N183 Chronic kidney disease, stage 3 unspecified: Secondary | ICD-10-CM

## 2011-10-29 DIAGNOSIS — I252 Old myocardial infarction: Secondary | ICD-10-CM | POA: Insufficient documentation

## 2011-10-29 HISTORY — PX: LIGATION GORETEX FISTULA: SHX5155

## 2011-10-29 LAB — GLUCOSE, CAPILLARY: Glucose-Capillary: 130 mg/dL — ABNORMAL HIGH (ref 70–99)

## 2011-10-29 LAB — POCT I-STAT 4, (NA,K, GLUC, HGB,HCT)
HCT: 32 % — ABNORMAL LOW (ref 36.0–46.0)
Sodium: 141 mEq/L (ref 135–145)

## 2011-10-29 SURGERY — LIGATION OF ARTERIOVENOUS  FISTULA
Anesthesia: Monitor Anesthesia Care | Site: Arm Lower | Laterality: Left | Wound class: Clean

## 2011-10-29 MED ORDER — FENTANYL CITRATE 0.05 MG/ML IJ SOLN
INTRAMUSCULAR | Status: DC | PRN
  Administered 2011-10-29 (×2): 50 ug via INTRAVENOUS

## 2011-10-29 MED ORDER — ONDANSETRON HCL 4 MG/2ML IJ SOLN
INTRAMUSCULAR | Status: DC | PRN
  Administered 2011-10-29: 4 mg via INTRAVENOUS

## 2011-10-29 MED ORDER — LACTATED RINGERS IV SOLN
INTRAVENOUS | Status: DC | PRN
  Administered 2011-10-29: 07:00:00 via INTRAVENOUS

## 2011-10-29 MED ORDER — DROPERIDOL 2.5 MG/ML IJ SOLN: 0.6250 mg | INTRAMUSCULAR | Status: DC | PRN

## 2011-10-29 MED ORDER — MIDAZOLAM HCL 5 MG/5ML IJ SOLN
INTRAMUSCULAR | Status: DC | PRN
  Administered 2011-10-29 (×2): 1 mg via INTRAVENOUS

## 2011-10-29 MED ORDER — HYDROMORPHONE HCL PF 1 MG/ML IJ SOLN: 0.2500 mg | INTRAMUSCULAR | Status: DC | PRN

## 2011-10-29 MED ORDER — ACETAMINOPHEN-CODEINE #3 300-30 MG PO TABS: 1.0000 | ORAL_TABLET | Freq: Four times a day (QID) | ORAL | Status: AC | PRN

## 2011-10-29 MED ORDER — PROPOFOL 10 MG/ML IV EMUL
INTRAVENOUS | Status: DC | PRN
  Administered 2011-10-29: 75 ug/kg/min via INTRAVENOUS

## 2011-10-29 MED ORDER — SODIUM CHLORIDE 0.9 % IR SOLN
Status: DC | PRN
  Administered 2011-10-29: 1000 mL

## 2011-10-29 MED ORDER — SODIUM CHLORIDE 0.9 % IV SOLN
INTRAVENOUS | Status: DC
  Administered 2011-10-29: 08:00:00 via INTRAVENOUS

## 2011-10-29 MED ORDER — SODIUM CHLORIDE 0.9 % IV SOLN: INTRAVENOUS | Status: DC | PRN

## 2011-10-29 MED ORDER — LIDOCAINE-EPINEPHRINE (PF) 1 %-1:200000 IJ SOLN
INTRAMUSCULAR | Status: DC | PRN
  Administered 2011-10-29: 7 mL

## 2011-10-29 SURGICAL SUPPLY — 31 items
CANISTER SUCTION 2500CC (MISCELLANEOUS) ×2 IMPLANT
CLIP TI MEDIUM 6 (CLIP) ×2 IMPLANT
CLIP TI WIDE RED SMALL 6 (CLIP) ×2 IMPLANT
CLOTH BEACON ORANGE TIMEOUT ST (SAFETY) ×2 IMPLANT
COVER SURGICAL LIGHT HANDLE (MISCELLANEOUS) ×4 IMPLANT
DERMABOND ADHESIVE PROPEN (GAUZE/BANDAGES/DRESSINGS) ×1
DERMABOND ADVANCED (GAUZE/BANDAGES/DRESSINGS) ×1
DERMABOND ADVANCED .7 DNX12 (GAUZE/BANDAGES/DRESSINGS) ×1 IMPLANT
DERMABOND ADVANCED .7 DNX6 (GAUZE/BANDAGES/DRESSINGS) ×1 IMPLANT
ELECT REM PT RETURN 9FT ADLT (ELECTROSURGICAL) ×2
ELECTRODE REM PT RTRN 9FT ADLT (ELECTROSURGICAL) ×1 IMPLANT
GEL ULTRASOUND 20GR AQUASONIC (MISCELLANEOUS) ×2 IMPLANT
GLOVE BIO SURGEON STRL SZ7.5 (GLOVE) ×2 IMPLANT
GOWN PREVENTION PLUS XLARGE (GOWN DISPOSABLE) ×6 IMPLANT
GOWN STRL NON-REIN LRG LVL3 (GOWN DISPOSABLE) IMPLANT
KIT BASIN OR (CUSTOM PROCEDURE TRAY) ×2 IMPLANT
KIT ROOM TURNOVER OR (KITS) ×2 IMPLANT
LOOP VESSEL MINI RED (MISCELLANEOUS) IMPLANT
NS IRRIG 1000ML POUR BTL (IV SOLUTION) ×2 IMPLANT
PACK CV ACCESS (CUSTOM PROCEDURE TRAY) ×2 IMPLANT
PAD ARMBOARD 7.5X6 YLW CONV (MISCELLANEOUS) ×2 IMPLANT
SPONGE SURGIFOAM ABS GEL 100 (HEMOSTASIS) IMPLANT
SUT PROLENE 6 0 CC (SUTURE) IMPLANT
SUT SILK 0 (SUTURE) IMPLANT
SUT VIC AB 3-0 SH 27 (SUTURE) ×1
SUT VIC AB 3-0 SH 27X BRD (SUTURE) ×1 IMPLANT
SUT VICRYL 4-0 PS2 18IN ABS (SUTURE) ×2 IMPLANT
TOWEL OR 17X24 6PK STRL BLUE (TOWEL DISPOSABLE) ×2 IMPLANT
TOWEL OR 17X26 10 PK STRL BLUE (TOWEL DISPOSABLE) ×2 IMPLANT
UNDERPAD 30X30 INCONTINENT (UNDERPADS AND DIAPERS) ×2 IMPLANT
WATER STERILE IRR 1000ML POUR (IV SOLUTION) ×2 IMPLANT

## 2011-10-29 NOTE — Progress Notes (Signed)
Patient ID: Kristi [name], female   DOB: [birth year], 70 y.o.   MRN: [id number]   The patient has been re-examined.  The patient's history and physical has been reviewed and is unchanged from Dr Dickson's note 10/16/11.  There is no change in the plan of care.  Fabienne Bruns, MD

## 2011-10-29 NOTE — Transfer of Care (Signed)
Immediate Anesthesia Transfer of Care Note  Patient: Kristi [name]  Procedure(s) Performed:  LIGATION GORE-TEX FISTULA - Ligation of competing branches left forearm fistula  Patient Location: PACU  Anesthesia Type: MAC  Level of Consciousness: awake, alert  and oriented  Airway & Oxygen Therapy: Patient Spontanous Breathing and Patient connected to face mask oxygen  Post-op Assessment: Report given to PACU RN and Post -op Vital signs reviewed and stable  Post vital signs: Reviewed and stable  Complications: No apparent anesthesia complications

## 2011-10-29 NOTE — Anesthesia Procedure Notes (Signed)
Procedure Name: MAC Date/Time: 10/29/2011 8:00 AM Performed by: De Nurse Pre-anesthesia Checklist: Patient identified, Emergency Drugs available, Suction available, Patient being monitored and Timeout performed Patient Re-evaluated:Patient Re-evaluated prior to inductionOxygen Delivery Method: Simple face mask Intubation Type: IV induction

## 2011-10-29 NOTE — Anesthesia Postprocedure Evaluation (Signed)
Anesthesia Post Note  Patient: Kristi [name]  Procedure(s) Performed:  LIGATION GORE-TEX FISTULA - Ligation of competing branches left forearm fistula  Anesthesia type: MAC  Patient location: PACU  Post pain: Pain level controlled  Post assessment: Patient's Cardiovascular Status Stable  Last Vitals:  Filed Vitals:   10/29/11 0930  BP:   Pulse: 52  Temp:   Resp: 15    Post vital signs: Reviewed and stable  Level of consciousness: sedated  Complications: No apparent anesthesia complications

## 2011-10-29 NOTE — Progress Notes (Signed)
Report given to phillip rn as caregiver 

## 2011-10-29 NOTE — Anesthesia Preprocedure Evaluation (Addendum)
Anesthesia Evaluation  Patient identified by MRN, date of birth, ID band Patient awake    Reviewed: Allergy & Precautions, H&P , NPO status , Patient's Chart, lab work & pertinent test results, reviewed documented beta blocker date and time   Airway Mallampati: II TM Distance: <3 FB Neck ROM: Full    Dental  (+) Teeth Intact and Dental Advisory Given   Pulmonary COPD COPD inhaler,  clear to auscultation        Cardiovascular hypertension, Pt. on medications + CAD, + Past MI and +CHF Regular Normal- Systolic murmurs    Neuro/Psych CVA, No Residual Symptoms    GI/Hepatic GERD-  Medicated,  Endo/Other  Diabetes mellitus-, Well Controlled, Type 2  Renal/GU      Musculoskeletal   Abdominal   Peds  Hematology   Anesthesia Other Findings   Reproductive/Obstetrics                          Anesthesia Physical Anesthesia Plan  ASA: III  Anesthesia Plan: MAC   Post-op Pain Management:    Induction:   Airway Management Planned: Simple Face Mask  Additional Equipment:   Intra-op Plan:   Post-operative Plan:   Informed Consent: I have reviewed the patients History and Physical, chart, labs and discussed the procedure including the risks, benefits and alternatives for the proposed anesthesia with the patient or authorized representative who has indicated his/her understanding and acceptance.   Dental advisory given  Plan Discussed with: CRNA, Anesthesiologist and Surgeon  Anesthesia Plan Comments:         Anesthesia Quick Evaluation

## 2011-10-29 NOTE — Preoperative (Signed)
Beta Blockers   Reason not to administer Beta Blockers:Not Applicable 

## 2011-10-29 NOTE — Progress Notes (Signed)
Report to [facility], Charity fundraiser. SSC

## 2011-10-29 NOTE — Op Note (Signed)
Procedure: Ligation of side branches left arm AV fistula using ultrasound guidance  Preoperative diagnosis: Non-maturing AV fistula left arm  Postoperative diagnosis: Same  Anesthesia: Local with IV sedation  Operative findings: Four small discrete branches ligated from the left cephalic vein, 2 in the mid forearm and two near the antecubital area  Operative details: After obtaining informed consent, the patient was taken to the operating room. The patient was placed in supine position operating table. After adequate sedation the patient's entire left upper extremity was prepped and draped in usual sterile fashion. Local anesthesia was brought on operative field. Ultrasound was used to identify several side branches in the left radiocephalic AV fistula. There was a pair side branches in the mid forearm. There was also an additional pair of branches up near the antecubital crease. The insertion of the cephalic vein into the basilic vein as well as the cephalic vein in the upper arm was identified and none of these branches were ligated.  Next local anesthesia was infiltrated over each area in the forearm where a sidebranch had been located. A transverse incision was made in each of these locations and carried down to the level of the fistula. There were side branches in the mid forearm which were each about 1-2 mm in diameter. These were dissected free circumferentially clipped distally and ligated proximally with a 3-0 silk tie. The branch vein was then divided. Similar procedure was then performed up near the antecubital crease and again 2 discrete branches were dissected free circumferentially and these were ligated and divided between silk ties. Both incisions were then reapproximated with a running 4 Vicryl subcuticular stitch. Dermabond was applied to each incision. Patient procedure well there were complications. There is palpable thrill the fistula in the case.  Fabienne Bruns, MD [facility] [place] Office: [phone] Pager: [phone]

## 2011-10-30 ENCOUNTER — Encounter (HOSPITAL_COMMUNITY): Payer: Self-pay | Admitting: Vascular Surgery

## 2011-11-08 ENCOUNTER — Ambulatory Visit (INDEPENDENT_AMBULATORY_CARE_PROVIDER_SITE_OTHER): Payer: Medicare Other | Admitting: Family Medicine

## 2011-11-08 ENCOUNTER — Encounter: Payer: Self-pay | Admitting: Family Medicine

## 2011-11-08 DIAGNOSIS — J4489 Other specified chronic obstructive pulmonary disease: Secondary | ICD-10-CM

## 2011-11-08 DIAGNOSIS — F329 Major depressive disorder, single episode, unspecified: Secondary | ICD-10-CM

## 2011-11-08 DIAGNOSIS — N183 Chronic kidney disease, stage 3 unspecified: Secondary | ICD-10-CM

## 2011-11-08 DIAGNOSIS — F17200 Nicotine dependence, unspecified, uncomplicated: Secondary | ICD-10-CM

## 2011-11-08 DIAGNOSIS — I2510 Atherosclerotic heart disease of native coronary artery without angina pectoris: Secondary | ICD-10-CM

## 2011-11-08 DIAGNOSIS — F32A Depression, unspecified: Secondary | ICD-10-CM

## 2011-11-08 DIAGNOSIS — F3289 Other specified depressive episodes: Secondary | ICD-10-CM

## 2011-11-08 DIAGNOSIS — K219 Gastro-esophageal reflux disease without esophagitis: Secondary | ICD-10-CM

## 2011-11-08 DIAGNOSIS — E119 Type 2 diabetes mellitus without complications: Secondary | ICD-10-CM

## 2011-11-08 DIAGNOSIS — E669 Obesity, unspecified: Secondary | ICD-10-CM

## 2011-11-08 DIAGNOSIS — J449 Chronic obstructive pulmonary disease, unspecified: Secondary | ICD-10-CM

## 2011-11-08 DIAGNOSIS — R609 Edema, unspecified: Secondary | ICD-10-CM

## 2011-11-08 DIAGNOSIS — R600 Localized edema: Secondary | ICD-10-CM

## 2011-11-08 MED ORDER — SERTRALINE HCL 50 MG PO TABS: 50.0000 mg | ORAL_TABLET | Freq: Every day | ORAL | Status: DC

## 2011-11-08 NOTE — Assessment & Plan Note (Signed)
F/up with Dr. Eliott Nine as scheduled during December 2012.

## 2011-11-08 NOTE — Patient Instructions (Addendum)
Return in 2-3  for recheck for depression.  Take zoloft 1 tablet daily.

## 2011-11-08 NOTE — Assessment & Plan Note (Signed)
Well controlled on albuterol when necessary and and Advair daily. Patient has stopped smoking-uses electronic cigarette 2 times a day.

## 2011-11-08 NOTE — Assessment & Plan Note (Signed)
Well controlled on lasix 40mg  daily

## 2011-11-08 NOTE — Progress Notes (Signed)
  Subjective:    Patient ID: Kristi [name], female    DOB: [birth year], 70 y.o.   MRN: [id number]  HPI Diabetes: Patient saw ophthalmologist during past month-up-to-date on eye screening. Taking diabetes medications as directed. Occasionally will skip Lantus dose when fasting blood glucose is less than 90. She states she is concerned that she will go low. She states this happens only 2-3 times per month.  Lower extremity edema: Per patient well controlled on Lasix 40 mg by mouth daily. Also improved with foot elevation. Not taking daily weights.  Kidney disease: Followed by renal doctor every 3 months-Dr. Eliott Nine. Had surgery on fistula by Dr. Edilia Bo during past month-to clamp off competing vasculature. See note.  Smoking cessation: Using electronic cigarette 2 times per day.  Obesity: Patient states she is not exercising at all. Would like to walk but just cannot seem to get motivated. States she would also like to lose weight but has been distracted by recent operation on fistula as well as to recent cataract surgeries. Also endorses decreased energy.  Depression: Patient states that she feels down often. Passive suicidal ideation.  Patient given PHQ 9-results showed: score of 16.   Nearly every day: little interest or pleasure, feeling depressed, trouble with sleep, decreased energy, feeling bad about self/guilt, -  serveral days has had passive thoughts of suicide.  No active plan.  States she is safe. Just thinks that everyone would be better off without her here sometimes.     Review of Systems     Objective:   Physical Exam  Constitutional: She is oriented to person, place, and time.  HENT:  Head: Normocephalic and atraumatic.  Cardiovascular: Normal rate, regular rhythm and normal heart sounds.   No murmur heard. Pulmonary/Chest: Effort normal. No respiratory distress. She has no wheezes. She has no rales.  Abdominal: Soft. She exhibits no distension.  Musculoskeletal: She  exhibits edema (trace).  Neurological: She is alert and oriented to person, place, and time.  Skin: No rash noted.  Psychiatric: She has a normal mood and affect. Her behavior is normal.       Tearful when talking about symptoms of depression- PHQ9 socre of 16          Assessment & Plan:

## 2011-11-08 NOTE — Assessment & Plan Note (Addendum)
Continues to use electronic cigarette 2 x per day.

## 2011-11-08 NOTE — Assessment & Plan Note (Addendum)
Patient states fasting blood sugars well controlled. Will obtain A1c today.  Foot exam Wnl today.  Encouraged foot checks.

## 2011-11-08 NOTE — Assessment & Plan Note (Signed)
Pt's goal is to walk 2-3 x [place] the mall [place]-15 min

## 2011-11-13 ENCOUNTER — Encounter: Payer: Self-pay | Admitting: Family Medicine

## 2011-11-18 ENCOUNTER — Encounter: Payer: Self-pay | Admitting: Family Medicine

## 2011-11-18 ENCOUNTER — Other Ambulatory Visit: Payer: Self-pay | Admitting: Family Medicine

## 2011-11-18 DIAGNOSIS — F32A Depression, unspecified: Secondary | ICD-10-CM | POA: Insufficient documentation

## 2011-11-18 DIAGNOSIS — F329 Major depressive disorder, single episode, unspecified: Secondary | ICD-10-CM | POA: Insufficient documentation

## 2011-11-18 MED ORDER — AMLODIPINE BESYLATE 10 MG PO TABS: 10.0000 mg | ORAL_TABLET | Freq: Every day | ORAL | Status: DC

## 2011-11-18 NOTE — Assessment & Plan Note (Signed)
PHQ-9 score of 16 on 11/08/11 Started on zoloft 50mg  daily.

## 2011-11-22 ENCOUNTER — Encounter: Payer: Self-pay | Admitting: Family Medicine

## 2011-11-22 ENCOUNTER — Ambulatory Visit (INDEPENDENT_AMBULATORY_CARE_PROVIDER_SITE_OTHER): Payer: Medicare Other | Admitting: Family Medicine

## 2011-11-22 DIAGNOSIS — F329 Major depressive disorder, single episode, unspecified: Secondary | ICD-10-CM

## 2011-11-22 MED ORDER — SERTRALINE HCL 100 MG PO TABS: 100.0000 mg | ORAL_TABLET | Freq: Every day | ORAL | Status: DC

## 2011-11-22 NOTE — Progress Notes (Signed)
  Subjective:    Patient ID: Kristi [name], female    DOB: 17-Oct-1941, 70 y.o.   MRN: [id number]  HPI Depression followup: Patient started on Zoloft 50 mg 2 weeks ago for new diagnosis of depression. Patient here for followup today. Patient states she is tolerating medicine well. Feels more calm, not as angry, not is bothered by things. Seems to have left thoughts of-"being better off dead". Denies active suicidal ideation or plan plan. No HI. Patient taking Zoloft daily. No increased agitation with Zoloft. PHQ9 given again today-gave score of 15. Patient surprised at the score is only improved by 1 point. States she feels much better then that. Patient states that nearly every day she has problems sleeping too much, not wanting to get out of bed, feeling tired having little energy. More than half of the days she has little interest or pleasure in doing things, feels down or depressed, has poor appetite or over eats, feels bad about herself, feels that she move slower than other people. Several days in the past 2 weeks has had thought should be better off dead.   Review of Systems As per above.    Objective:   Physical Exam  Constitutional: She is oriented to person, place, and time. She appears well-developed and well-nourished.  HENT:  Head: Normocephalic and atraumatic.  Cardiovascular: Normal rate.   Pulmonary/Chest: Effort normal. No respiratory distress.  Neurological: She is alert and oriented to person, place, and time.  Skin: No rash noted.  Psychiatric: She has a normal mood and affect.       Tearful. No flight of ideas. No grandiose thoughts. No apparent auditory or visual hallucinations.  PHQ-9 score of 15          Assessment & Plan:

## 2011-11-22 NOTE — Patient Instructions (Signed)
Increase zoloft to 2 tablets daily.  I would like you to make an appointment with a therapist- Dr. Pascal Lux here at our clinic or any therpist that you prefer.-- check with your insurance to see what they will cover.

## 2011-11-25 NOTE — Assessment & Plan Note (Signed)
PHQ9 score of 15-- although the score not dramatically improved pt states she feels some improvement- increased zoloft to 100mg  daily- recommended therapy with Dr. Pascal Lux or other provider.  Pt to f/up with me mid January.

## 2011-11-27 ENCOUNTER — Telehealth: Payer: Self-pay | Admitting: Family Medicine

## 2011-11-27 MED ORDER — GLUCOSE BLOOD VI STRP: ORAL_STRIP | Status: DC

## 2011-11-27 NOTE — Telephone Encounter (Signed)
Pt calling about her test strips, pharmacy sent request and is in Dr. Edmonia James' box, pt leaving to go out of town on Friday and only has 1 strip left.

## 2011-11-27 NOTE — Telephone Encounter (Signed)
Refill done on test strips

## 2011-12-09 ENCOUNTER — Encounter: Payer: Self-pay | Admitting: Vascular Surgery

## 2011-12-11 ENCOUNTER — Ambulatory Visit (INDEPENDENT_AMBULATORY_CARE_PROVIDER_SITE_OTHER): Payer: Medicare Other | Admitting: *Deleted

## 2011-12-11 ENCOUNTER — Encounter: Payer: Self-pay | Admitting: Vascular Surgery

## 2011-12-11 ENCOUNTER — Ambulatory Visit (INDEPENDENT_AMBULATORY_CARE_PROVIDER_SITE_OTHER): Payer: Medicare Other | Admitting: Vascular Surgery

## 2011-12-11 VITALS — BP 129/65 | HR 54 | Resp 16 | Ht 60.25 in | Wt 188.0 lb

## 2011-12-11 DIAGNOSIS — N186 End stage renal disease: Secondary | ICD-10-CM

## 2011-12-11 DIAGNOSIS — T82598A Other mechanical complication of other cardiac and vascular devices and implants, initial encounter: Secondary | ICD-10-CM

## 2011-12-11 DIAGNOSIS — T82898A Other specified complication of vascular prosthetic devices, implants and grafts, initial encounter: Secondary | ICD-10-CM

## 2011-12-11 NOTE — Progress Notes (Signed)
WU:[id number] up of left radiocephalic AV fistula. HPI: Kristi [name] is a 71 y.o. female who had a left radiocephalic AV fistula placed in April of 2012. It was a small 3.5 mm vein and the artery was calcified and small. She underwent a fistulogram within the fistula was not maturing adequately and this shows some competing branches which were ligated in November of this year. She comes in for a follow up visit. She has not yet on dialysis. She states her renal function has been relatively stable. He is followed by Dr. Eliott Nine. She's had no recent uremic symptoms.  SOCIAL HISTORY: History  Substance Use Topics  . Smoking status: Former Smoker -- 1.0 packs/day for 47 years    Types: Cigarettes    Quit date: 03/27/2011  . Smokeless tobacco: Current User  . Alcohol Use: No    REVIEW OF SYSTEMS: CONSTITUTIONAL:  [ ]  fever   [ ]  chills CARDIOVASCULAR: [ ]  chest pain   [ ]  chest pressure   [ ]  palpitations   [ ]  orthopnea   [ ]  dyspnea on exertion   [ ]  claudication   [ ]  rest pain   [ ]  DVT   [ ]  phlebitis.  PHYSICAL EXAM: Filed Vitals:   12/11/11 1629  BP: 129/65  Pulse: 54  Resp: 16   Body mass index is 36.41 kg/(m^2).  GENERAL: The patient appears their stated age. The vital signs are documented above. CARDIOVASCULAR: There is a regular rate and rhythm without significant murmur appreciated.  PULMONARY: There is good air exchange bilaterally without wheezing or rales. Her fistula has a good thrill proximally but it is more difficult to follow distally. The incisions are healed nicely.  DATA: I have independently interpreted her duplex scan of her fistula which shows an area of increased callosity within the native radial artery and the anastomosis. The cephalic vein in the forearm empties into the basilic system in the upper arm.  MEDICAL ISSUES: I think the areas of increased velocity are related to the small size of the radial artery based on her previous fistulogram. Therefore I do  not see any options to revise the fistula into a more proximal segment of the artery given that the artery is small in general. If this fistula does not mature adequately I think the next best option would be a basilic vein transposition. However performed proceeding to this I think it would be reasonable to give this some more time to see if the upper part of the fistula continues to improve. I plan on seeing her back in 2 months. She knows to call sooner if she has problems.

## 2011-12-18 NOTE — Procedures (Unsigned)
VASCULAR LAB EXAM  INDICATION:  Follow up plication of accessory branches of left radiocephalic fistula.  HISTORY: Diabetes: Cardiac: Hypertension:  EXAM: 1. Left radiocephalic fistula is patent with the basilic vein being     the dominant conduit in the upper arm. 2. There are significantly elevated velocities in the radial artery at     the anastomosis. 3. The radial artery distal to the anastomosis has retrograde flow. 4. Please see diagram for depths, diameters, and velocities.  IMPRESSION:  Patent left arteriovenous fistula with increased radial velocities, as described above.  ___________________________________________ Di Kindle. Edilia Bo, M.D.  LT/MEDQ  D:  12/11/2011  T:  12/11/2011  Job:  [id number]

## 2011-12-25 ENCOUNTER — Encounter: Payer: Self-pay | Admitting: Family Medicine

## 2011-12-25 ENCOUNTER — Ambulatory Visit (INDEPENDENT_AMBULATORY_CARE_PROVIDER_SITE_OTHER): Payer: Medicare Other | Admitting: Family Medicine

## 2011-12-25 DIAGNOSIS — F329 Major depressive disorder, single episode, unspecified: Secondary | ICD-10-CM

## 2011-12-25 DIAGNOSIS — I10 Essential (primary) hypertension: Secondary | ICD-10-CM

## 2011-12-25 DIAGNOSIS — E669 Obesity, unspecified: Secondary | ICD-10-CM

## 2011-12-25 DIAGNOSIS — J449 Chronic obstructive pulmonary disease, unspecified: Secondary | ICD-10-CM

## 2011-12-25 NOTE — Patient Instructions (Signed)
Depression: Keep taking medications.  I am sooooo Glad that you are feeling better.  Weight management: Walking at the mall 3 x per week- for any amount of time.   Blood pressure: We will continue to monitor.  Keep taking meds  Return at the end of Feb. -- and we will discuss weight management, depression, and copd.

## 2011-12-28 NOTE — Assessment & Plan Note (Signed)
PHQ-9 score of 9- states much better.  Doesn't feel she needs to see therapist.  Will continue zoloft at current dosage of 100mg .  Has been 2 years since last tsh check.  Could consider checking tsh at next blood draw to ensure that wnl in the setting of decreased energy, weight problems and depression.

## 2011-12-28 NOTE — Assessment & Plan Note (Signed)
Pt states that she has not been using meds for over 6 months. Pt to monitor symptoms until next appt and we will discuss pros and cons of stopping meds at next appt.

## 2011-12-28 NOTE — Assessment & Plan Note (Signed)
Continue current regimen.  Well controlled at this time.

## 2011-12-28 NOTE — Assessment & Plan Note (Signed)
Pt goal is to go to mall to walk 3 x per week.  Will start with walking just 5-80min initially will increase time as able.

## 2011-12-28 NOTE — Progress Notes (Signed)
  Subjective:    Patient ID: Kristi [name], female    DOB: 03-May-1941, 71 y.o.   MRN: [id number]  HPI Depression f/up: Pt states that she feels much better.  More at peace. Able to let things roll off her back.  No longer getting irritated.  Continues to have some decrease in energy and still having some sleep issues.  Gets up multiple x per night to urinate and this seems to be disrupting sleep.  PHQ-9= 9.  No SI or HI.   Weight: Pt states that she sometimes skips meals, especially breakfast.  Not exercising at all.  States that it is hard to get motivated.  Has plan to go walk at the mall but states that it is tempting just to stay home and watch tv and play games so that is what she usually does.  Blood pressure: BP 138/60 today.   No dizziness.  No sob.  No chest pain. Minimal lower ext edema.   COPD: States that she is no longer using her steroid inhaler.  Has used for over 6 months and has not had any sob or wheezing.  Wants to know if she can stop this medication.   Review of Systems As per above.     Objective:   Physical Exam  Constitutional: She appears well-developed and well-nourished.       obese  HENT:  Head: Normocephalic and atraumatic.  Cardiovascular: Normal rate, regular rhythm and normal heart sounds.   No murmur heard. Pulmonary/Chest: Effort normal. No respiratory distress. She has no wheezes. She has no rales.  Abdominal: Soft. She exhibits no distension.  Musculoskeletal: She exhibits no edema.  Neurological: She is alert.  Skin: No rash noted.  Psychiatric: She has a normal mood and affect. Her behavior is normal. Judgment and thought content normal.       PHQ-9= score of 9.  No SI or HI.          Assessment & Plan:

## 2011-12-31 ENCOUNTER — Other Ambulatory Visit: Payer: Self-pay | Admitting: Family Medicine

## 2011-12-31 NOTE — Telephone Encounter (Signed)
Refill request

## 2012-01-02 ENCOUNTER — Encounter: Payer: Self-pay | Admitting: Family Medicine

## 2012-01-04 ENCOUNTER — Telehealth: Payer: Self-pay | Admitting: Oncology

## 2012-01-04 NOTE — Telephone Encounter (Signed)
Called pt , left message, appt on March 8th lab and MD

## 2012-02-09 ENCOUNTER — Other Ambulatory Visit: Payer: Self-pay | Admitting: Family Medicine

## 2012-02-09 NOTE — Telephone Encounter (Signed)
Refill request

## 2012-02-11 ENCOUNTER — Encounter: Payer: Self-pay | Admitting: Vascular Surgery

## 2012-02-11 ENCOUNTER — Ambulatory Visit (INDEPENDENT_AMBULATORY_CARE_PROVIDER_SITE_OTHER): Payer: Medicare Other | Admitting: Family Medicine

## 2012-02-11 ENCOUNTER — Encounter: Payer: Self-pay | Admitting: Family Medicine

## 2012-02-11 VITALS — BP 134/70 | HR 63 | Ht 60.25 in | Wt 192.0 lb

## 2012-02-11 DIAGNOSIS — J3489 Other specified disorders of nose and nasal sinuses: Secondary | ICD-10-CM

## 2012-02-11 DIAGNOSIS — F329 Major depressive disorder, single episode, unspecified: Secondary | ICD-10-CM

## 2012-02-11 DIAGNOSIS — E669 Obesity, unspecified: Secondary | ICD-10-CM

## 2012-02-11 DIAGNOSIS — J449 Chronic obstructive pulmonary disease, unspecified: Secondary | ICD-10-CM

## 2012-02-11 DIAGNOSIS — E785 Hyperlipidemia, unspecified: Secondary | ICD-10-CM

## 2012-02-11 DIAGNOSIS — E119 Type 2 diabetes mellitus without complications: Secondary | ICD-10-CM

## 2012-02-11 DIAGNOSIS — R0981 Nasal congestion: Secondary | ICD-10-CM

## 2012-02-11 LAB — POCT GLYCOSYLATED HEMOGLOBIN (HGB A1C): Hemoglobin A1C: 5.6

## 2012-02-11 MED ORDER — ALENDRONATE SODIUM 70 MG PO TABS: 70.0000 mg | ORAL_TABLET | ORAL | Status: DC

## 2012-02-11 MED ORDER — FEXOFENADINE HCL 180 MG PO TABS: 180.0000 mg | ORAL_TABLET | Freq: Every day | ORAL | Status: DC

## 2012-02-11 NOTE — Assessment & Plan Note (Addendum)
Only using electronic cigarette.  No compliant with advair.  Could consider d/c if no exacerbations at future appt.

## 2012-02-11 NOTE — Assessment & Plan Note (Signed)
Pt to request for lipid panel to be drawn with next labs at [facility] appt. And will have them faxed to me.

## 2012-02-11 NOTE — Assessment & Plan Note (Signed)
Continue current zoloft dosage.  Consider repeat PHQ-9 at next appt.

## 2012-02-11 NOTE — Patient Instructions (Signed)
Diabetes: Your A1C today was 5.6!!!!!!! [place] job! Keep an eye out for low readings or symptoms of low blood sugars.  Please let me know if you have any lows(anything less than70)  because me may need to change your medicines.  Keep up the great work! Consider getting a dental appointment.   Sinus congestion: Nasal saline spray as needed.  Or neti pot can be used.   Exercise: Continue to think about how you can integrate exercise into you daily routine.

## 2012-02-11 NOTE — Assessment & Plan Note (Signed)
A1c 5.6 today.  Continue insulin as directed. Monitor closely for hypoglycemia.  At next appt could consider d/c oral agent.  Pt encouraged to eat 3 meals per day.  Pt to return in 3 months for recheck.

## 2012-02-11 NOTE — Assessment & Plan Note (Signed)
Most likely is 2/2 viral etiology.  Symptomatic treatment with nasal saline spray only at this time.  If no improvement in 1 week.  Pt is to call back and let me know.  May be 2/2 allergies and we could do a trial of nasal saline spray.

## 2012-02-11 NOTE — Progress Notes (Signed)
  Subjective:    Patient ID: Kristi [name], female    DOB: [birth year], 71 y.o.   MRN: [id number]  HPI Sinus congestion: Patient reports some congestion and a sensation of feeling " stopped up" x2 weeks. No sore throat. No nausea. No vomiting. No diarrhea. No fever. No eye itching. No sick contact. No cough. No headache. Nothing seemed to make it better. Nothing seems to make it worse.  Diabetes: Patient states that she take insulin as directed. Occasionally will increase or decrease by couple units if her fasting blood sugar is high or low. States that she skips meals almost on a daily basis. Especially breakfast. States the blood glucose fasting ranges from 85 to 130s. Had one elevation at 170. Has not had any lows. Has not had any symptoms of hypoglycemia. Has not gone for dental appointment.  Weight management: Patient states that she still has not developed an exercise routine. She states that her biggest there is the weather. She doesn't feel that she can be active until the weather improves. She does not want to get out of the house.  Depression: Patient states that she feels that she is doing well with Zoloft. She states that she does have days that she feels down. Usually this is when it is raining outside. She states that she is doing much better than she was before she started the Zoloft. No SI. No HI.  COPD: Patient uses albuterol occasionally with recent nasal congestion. No wheezing. Sometimes uses a cough and sensation of shortness of breath especially in setting of recent nasal congestion. Has not been using Advair inhaler consistently.   Review of Systems    as per above. Objective:   Physical Exam  Constitutional: She appears well-developed and well-nourished.       obese  HENT:  Head: Normocephalic and atraumatic.       Problem nasal drainage-clear.  Eyes: Pupils are equal, round, and reactive to light. Right eye exhibits no discharge. Left eye exhibits no discharge.    Cardiovascular: Normal rate, regular rhythm and normal heart sounds.   No murmur heard. Pulmonary/Chest: Effort normal. No respiratory distress. She has no wheezes. She has no rales. She exhibits no tenderness.  Abdominal: Soft. She exhibits no distension.  Musculoskeletal: She exhibits no edema.  Psychiatric: She has a normal mood and affect.          Assessment & Plan:

## 2012-02-11 NOTE — Assessment & Plan Note (Signed)
Encouraged pt to develop exercise plan.  Not able to set goals today.

## 2012-02-12 ENCOUNTER — Encounter: Payer: Self-pay | Admitting: Vascular Surgery

## 2012-02-12 ENCOUNTER — Ambulatory Visit (INDEPENDENT_AMBULATORY_CARE_PROVIDER_SITE_OTHER): Payer: Medicare Other | Admitting: Vascular Surgery

## 2012-02-12 ENCOUNTER — Ambulatory Visit: Payer: Medicare Other | Admitting: Oncology

## 2012-02-12 ENCOUNTER — Other Ambulatory Visit: Payer: Self-pay

## 2012-02-12 VITALS — BP 135/69 | HR 57 | Resp 16 | Ht 60.0 in | Wt 191.0 lb

## 2012-02-12 DIAGNOSIS — N186 End stage renal disease: Secondary | ICD-10-CM

## 2012-02-12 NOTE — Progress Notes (Signed)
[facility] [place]  Patient name: Kristi [name] MRN: [id number] DOB: 06/14/1941 Sex: female  REASON FOR VISIT: follow up of left radiocephalic AV fistula  HPI: Kristi [name] is a 71 y.o. female who had a left radiocephalic fistula placed in April 2012. The vein was noted to be fairly small. She is not yet on dialysis. She underwent a fistulogram which showed that she had 2 competing branches which were ligated in November. She comes in for continued follow up. She's had no complaints referrable to the fistula. She's had no recent uremic symptoms. Specifically she denies nausea, vomiting, fatigue, or anorexia.  Past Medical History  Diagnosis Date  . Hyperlipidemia   . Hyperparathyroidism   . CAD (coronary artery disease)   . Myocardial infarction 2008  . Hypertension   . COPD (chronic obstructive pulmonary disease)   . Diabetes mellitus     diagnosed 2010 - takes oral meds and lantus insulin  . Anemia   . Chronic kidney disease     esrd - since 2010  . GERD (gastroesophageal reflux disease)     on omeprazole  . Arthritis     all over  . CHF (congestive heart failure) 08-2011  . CVA 02/19/2010  . CAD 02/19/2010  . DEEP VENOUS THROMBOPHLEBITIS, LEG, LEFT 03/08/2010    in 2009 s/p hospitalization- only 1 isolated dvt  . Stroke ?2010    found remote CVA on CT scan    Family History  Problem Relation Age of Onset  . Cancer Mother     multiple myloma  . Dementia Father   . Pneumonia Father   . Anuerysm Father     SOCIAL HISTORY: History  Substance Use Topics  . Smoking status: Former Smoker -- 0.0 packs/day for 47 years    Types: Cigarettes    Quit date: 03/27/2011  . Smokeless tobacco: Current User  . Alcohol Use: No    No Known Allergies  Current Outpatient Prescriptions  Medication Sig Dispense Refill  . ADVAIR DISKUS 250-50 MCG/DOSE AEPB INHALE 1 PUFF TWICE A DAY  1 each  2  . albuterol (PROVENTIL HFA) 108 (90 BASE) MCG/ACT inhaler Inhale 2  puffs into the lungs every 4 (four) hours as needed. For wheezing       . alendronate (FOSAMAX) 70 MG tablet Take 1 tablet (70 mg total) by mouth every 7 (seven) days. In Am,with glass of water, on empty stomach, nothing by mouth or lie down for 30 min.  12 tablet  4  . amLODipine (NORVASC) 10 MG tablet Take 1 tablet (10 mg total) by mouth daily.  30 tablet  3  . aspirin 325 MG tablet Take 325 mg by mouth daily.        . B-D ULTRAFINE III SHORT PEN 31G X 8 MM MISC USE AS DIRECTED 4 TIMES A DAY  100 each  12  . fexofenadine (ALLEGRA) 180 MG tablet Take 1 tablet (180 mg total) by mouth daily.  90 tablet  4  . fish oil-omega-3 fatty acids 1000 MG capsule Take 3 g by mouth 2 (two) times daily.       . furosemide (LASIX) 40 MG tablet 40 mg daily.       . glipiZIDE (GLUCOTROL XL) 5 MG 24 hr tablet TAKE 1 TABLET BY MOUTH EVERY DAY  30 tablet  5  . glucosamine-chondroitin 500-400 MG tablet Take 1 tablet by mouth 2 (two) times daily.        .   glucose blood (ONE TOUCH TEST STRIPS) test strip Check blood sugar 4 times daily.  100 each  3  . LANTUS SOLOSTAR 100 UNIT/ML injection INJECT 13 UNITS EVERY MORNING AS DIRECTED  3 mL  15  . LIFESCAN FINEPOINT LANCETS MISC by Does not apply route. Check blood sugars four times a day       . metoprolol (LOPRESSOR) 25 MG tablet Take 25 mg by mouth 2 (two) times daily.        . nitroGLYCERIN (NITROSTAT) 0.4 MG SL tablet Place 0.4 mg under the tongue every 5 (five) minutes as needed. For chest pain     Call 911 if pain not relieved by 3rd tab      . omeprazole (PRILOSEC) 20 MG capsule ONE BY MOUTH DAILY  90 capsule  3  . Polyethyl Glycol-Propyl Glycol (SYSTANE OP) Apply 2 drops to eye 2 (two) times daily as needed. For dry or irritated eyes       . rosuvastatin (CRESTOR) 20 MG tablet Take 1 tablet (20 mg total) by mouth at bedtime.  30 tablet  11  . sertraline (ZOLOFT) 100 MG tablet Take 1 tablet (100 mg total) by mouth daily.  30 tablet  2  . vitamin B-12  (CYANOCOBALAMIN) 1000 MCG tablet Take 1,000 mcg by mouth daily.          REVIEW OF SYSTEMS: [X ] denotes positive finding; [  ] denotes negative finding  CARDIOVASCULAR:  [ ] chest pain   [ ] chest pressure   [ ] palpitations   [ ] orthopnea  [ ] dyspnea on exertion   CONSTITUTIONAL:  [ ] fever   [ ] chills  PHYSICAL EXAM: Filed Vitals:   02/12/12 1506  BP: 135/69  Pulse: 57  Resp: 16  Height: 5' (1.524 m)  Weight: 191 lb (86.637 kg)  SpO2: 90%   Body mass index is 37.30 kg/(m^2). GENERAL: The patient is a well-nourished female, in no acute distress. The vital signs are documented above. CARDIOVASCULAR: There is a regular rate and rhythm without significant murmur appreciated. She has palpable radial pulses bilaterally. PULMONARY: There is good air exchange bilaterally without wheezing or rales. Her right radiocephalic fistula has a palpable thrill up of the forearm. It is slightly pulsatile.  DATA:  Previous duplex had shown some elevated velocities in the native radial artery on the left likely related to the small size of the artery. There is also some elevated velocities at the anastomosis. Beyond that the vein appeared to be reasonable in size.  MEDICAL ISSUES: The the fistula still does not appear to be usable for access. This reason I recommended we proceed with a fistulogram and wheezing or a limited dye as he is not yet on dialysis. If we see any correctable problems then we would address these. If there are no correctable problems then she'll have to be considered for a basilic vein transposition on the left for access. She scheduled to see Dr. Dunham in April. Last her creatinine was checked it was 2.4 and her renal function has remained relatively stable. We will make further recommendations pending results of her fistulogram which is scheduled for 02/17/2012.  Aolanis Crispen S [facility] Beeper: [phone]    

## 2012-02-13 ENCOUNTER — Encounter (HOSPITAL_COMMUNITY): Payer: Self-pay | Admitting: Pharmacy Technician

## 2012-02-14 ENCOUNTER — Other Ambulatory Visit (HOSPITAL_BASED_OUTPATIENT_CLINIC_OR_DEPARTMENT_OTHER): Payer: Medicare Other | Admitting: Lab

## 2012-02-14 ENCOUNTER — Telehealth: Payer: Self-pay | Admitting: Oncology

## 2012-02-14 ENCOUNTER — Ambulatory Visit (HOSPITAL_BASED_OUTPATIENT_CLINIC_OR_DEPARTMENT_OTHER): Payer: Medicare Other | Admitting: Oncology

## 2012-02-14 VITALS — BP 106/50 | HR 67 | Temp 96.9°F | Ht 60.0 in | Wt 190.5 lb

## 2012-02-14 DIAGNOSIS — N289 Disorder of kidney and ureter, unspecified: Secondary | ICD-10-CM

## 2012-02-14 DIAGNOSIS — D472 Monoclonal gammopathy: Secondary | ICD-10-CM

## 2012-02-14 DIAGNOSIS — E119 Type 2 diabetes mellitus without complications: Secondary | ICD-10-CM

## 2012-02-14 DIAGNOSIS — N189 Chronic kidney disease, unspecified: Secondary | ICD-10-CM

## 2012-02-14 DIAGNOSIS — D649 Anemia, unspecified: Secondary | ICD-10-CM

## 2012-02-14 LAB — CBC WITH DIFFERENTIAL/PLATELET
BASO%: 0.4 % (ref 0.0–2.0)
Eosinophils Absolute: 0.2 10*3/uL (ref 0.0–0.5)
HCT: 32.9 % — ABNORMAL LOW (ref 34.8–46.6)
MCHC: 33 g/dL (ref 31.5–36.0)
MONO#: 0.6 10*3/uL (ref 0.1–0.9)
NEUT#: 6.9 10*3/uL — ABNORMAL HIGH (ref 1.5–6.5)
NEUT%: 75.2 % (ref 38.4–76.8)
RBC: 3.78 10*6/uL (ref 3.70–5.45)
WBC: 9.1 10*3/uL (ref 3.9–10.3)
lymph#: 1.4 10*3/uL (ref 0.9–3.3)

## 2012-02-14 LAB — COMPREHENSIVE METABOLIC PANEL
AST: 16 U/L (ref 0–37)
Albumin: 3.4 g/dL — ABNORMAL LOW (ref 3.5–5.2)
Alkaline Phosphatase: 72 U/L (ref 39–117)
BUN: 45 mg/dL — ABNORMAL HIGH (ref 6–23)
Creatinine, Ser: 2.88 mg/dL — ABNORMAL HIGH (ref 0.50–1.10)
Glucose, Bld: 96 mg/dL (ref 70–99)
Potassium: 4.8 mEq/L (ref 3.5–5.3)
Total Bilirubin: 0.2 mg/dL — ABNORMAL LOW (ref 0.3–1.2)

## 2012-02-14 NOTE — Progress Notes (Signed)
Hematology and Oncology Follow Up Visit  Kristi [name] [id number] [birth year] 71 y.o. 02/14/2012 3:33 PM  CC: Aram Beecham B. Eliott Nine, M.D.  Helane Rima, MD  Corky Crafts, MD    Principle Diagnosis: This is a 72 year old female with monoclonal gammopathy of undetermined significance.  She has an IgG lambda subtype without any evidence of end-organ damage. Her bone marrow biopsy done in June 2011 showed a very mild plasmacytosis.  Plasma cells ranged between 5% to 8%.     Secondary diagnosis: Includes chronic renal insufficiency due to longstanding hypertension and diabetes.  No evidence of a multiple myeloma at this time.  Interim History:  Kristi [name] presents today for a followup visit.  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  Her workup for multiple myeloma has been negative.  She did not have any evidence to suggest lytic bony lesions on her plain film x-rays.  Her bone marrow biopsy done in June 2011 showed a very mild plasmacytosis.  Plasma cells ranged between 5% to 8%.  No evidence of any again infiltrated plasma cell presence on the bone marrow. Clinically Kristi [name] reports that she is doing fairly well. No recent bone pain or pathological fracture or recent infection.     Medications: I have reviewed the patient's current medications. Current outpatient prescriptions:albuterol (PROVENTIL HFA) 108 (90 BASE) MCG/ACT inhaler, Inhale 2 puffs into the lungs every 4 (four) hours as needed. For wheezing , Disp: , Rfl: ;  alendronate (FOSAMAX) 70 MG tablet, Take 70 mg by mouth every 7 (seven) days. In Am,with glass of water, on empty stomach, nothing by mouth or lie down for 30 min. Takes on Sunday., Disp: , Rfl:  amLODipine (NORVASC) 10 MG tablet, Take 10 mg by mouth every morning., Disp: , Rfl: ;  aspirin 325 MG tablet, Take 325 mg by mouth at bedtime. , Disp: , Rfl: ;  fexofenadine (ALLEGRA) 180 MG tablet, Take 180 mg by mouth every morning., Disp: , Rfl: ;   Fluticasone-Salmeterol (ADVAIR) 250-50 MCG/DOSE AEPB, Inhale 1 puff into the lungs every morning., Disp: , Rfl: ;  furosemide (LASIX) 40 MG tablet, Take 40 mg by mouth every morning. , Disp: , Rfl:  glipiZIDE (GLUCOTROL XL) 5 MG 24 hr tablet, Take 5 mg by mouth at bedtime., Disp: , Rfl: ;  glucosamine-chondroitin 500-400 MG tablet, Take 1 tablet by mouth 2 (two) times daily.  , Disp: , Rfl: ;  insulin glargine (LANTUS) 100 UNIT/ML injection, Inject 13 Units into the skin every morning. If blood sugar is >140 add 1 unit. If blood sugar is <140 subtract 1 unit., Disp: , Rfl:  metoprolol (LOPRESSOR) 25 MG tablet, Take 25 mg by mouth 2 (two) times daily.  , Disp: , Rfl: ;  nitroGLYCERIN (NITROSTAT) 0.4 MG SL tablet, Place 0.4 mg under the tongue every 5 (five) minutes as needed. For chest pain     Call 911 if pain not relieved by 3rd tab, Disp: , Rfl: ;  Omega-3 Fatty Acids (FISH OIL) 1200 MG CAPS, Take 2 capsules by mouth 2 (two) times daily., Disp: , Rfl:  omeprazole (PRILOSEC) 20 MG capsule, Take 20 mg by mouth at bedtime. , Disp: , Rfl: ;  rosuvastatin (CRESTOR) 20 MG tablet, Take 1 tablet (20 mg total) by mouth at bedtime., Disp: 30 tablet, Rfl: 11;  sertraline (ZOLOFT) 100 MG tablet, Take 100 mg by mouth every morning., Disp: , Rfl: ;  vitamin B-12 (CYANOCOBALAMIN) 1000 MCG tablet, Take 1,000  mcg by mouth every morning. , Disp: , Rfl:   Allergies: No Known Allergies  Past Medical History, Surgical history, Social history, and Family History were reviewed and updated.  Review of Systems: Constitutional:  Negative for fever, chills, night sweats, anorexia, weight loss, pain. Cardiovascular: no chest pain or dyspnea on exertion Respiratory: negative Neurological: negative Dermatological: negative ENT: negative Skin: Negative. Gastrointestinal: negative Genito-Urinary: negative Hematological and Lymphatic: negative Breast: negative Musculoskeletal: negative Remaining ROS negative. Physical  Exam: Blood pressure 106/50, pulse 67, temperature 96.9 F (36.1 C), temperature source Oral, height 5' (1.524 m), weight 190 lb 8 oz (86.41 kg). ECOG: 1 General appearance: alert Head: Normocephalic, without obvious abnormality, atraumatic Neck: no adenopathy, no carotid bruit, no JVD, supple, symmetrical, trachea midline and thyroid not enlarged, symmetric, no tenderness/mass/nodules Lymph nodes: Cervical, supraclavicular, and axillary nodes normal. Heart:regular rate and rhythm, S1, S2 normal, no murmur, click, rub or gallop Lung:chest clear, no wheezing, rales, normal symmetric air entry Abdomin: soft, non-tender, without masses or organomegaly EXT:no erythema, induration, or nodules   Lab Results: Lab Results  Component Value Date   WBC 9.1 02/14/2012   HGB 10.9* 02/14/2012   HCT 32.9* 02/14/2012   MCV 87.0 02/14/2012   PLT 184 02/14/2012     Chemistry      Component Value Date/Time   NA 141 10/29/2011 0640   K 4.7 10/29/2011 0640   CL 102 10/23/2011 1710   CO2 25 10/23/2011 1710   BUN 53* 10/23/2011 1710   CREATININE 2.06* 10/23/2011 1710   CREATININE 2.30* 08/26/2011 1132      Component Value Date/Time   CALCIUM 11.9* 10/23/2011 1710   CALCIUM 9.9 03/29/2010 0000   ALKPHOS 82 07/31/2011 0853   ALKPHOS 82 07/31/2011 0853   ALKPHOS 82 07/31/2011 0853   ALKPHOS 82 07/31/2011 0853   ALKPHOS 82 07/31/2011 0853   ALKPHOS 82 07/31/2011 0853   AST 21 07/31/2011 0853   AST 21 07/31/2011 0853   AST 21 07/31/2011 0853   AST 21 07/31/2011 0853   AST 21 07/31/2011 0853   AST 21 07/31/2011 0853   ALT 23 07/31/2011 0853   ALT 23 07/31/2011 0853   ALT 23 07/31/2011 0853   ALT 23 07/31/2011 0853   ALT 23 07/31/2011 0853   ALT 23 07/31/2011 0853   BILITOT 0.3 07/31/2011 0853   BILITOT 0.3 07/31/2011 0853   BILITOT 0.3 07/31/2011 0853   BILITOT 0.3 07/31/2011 0853   BILITOT 0.3 07/31/2011 0853   BILITOT 0.3 07/31/2011 0853     Results for Kristi, [name] (MRN [id number]) as of 02/14/2012 15:20  Ref.  Range 01/29/2011 15:28 07/31/2011 08:53  M-SPIKE, % No range found 0.47 0.44    Impression and Plan: This is a pleasant 71 year old female with the following issues:  1. IgG lambda monoclonal protein represents monoclonal gammopathy of undetermined significance without any evidence of multiple myeloma.  Skeletal survey and bone marrow biopsy again failed to show evidence of myeloma.  So far her serum protein electrophoresis actually showed a smaller M-spike. I will continue to monitor her every 6 months, as we are still dealing with MGUS.  She still has the risk of developing multiple myeloma, so if she develops worsening anemia, worsening renal function that is not explained, then we will restage her at that time.  2. Chronic renal sufficiency, again followed by Dr. Eliott Nine.  Her last creatinine was around 2.0.  It may be she has some stabilization of her renal function at  this time.  3. Anemia.  Her hemoglobin is stable.  She might need growth factor support if she is not already getting that.      Eli Hose, MD 3/8/20133:33 PM

## 2012-02-14 NOTE — Telephone Encounter (Signed)
gv pt appt for sept2013.  scheduled pt for bone survey for 09/09 @ [facility]

## 2012-02-16 ENCOUNTER — Other Ambulatory Visit: Payer: Self-pay | Admitting: Family Medicine

## 2012-02-16 NOTE — Telephone Encounter (Signed)
Refill request

## 2012-02-17 ENCOUNTER — Ambulatory Visit (HOSPITAL_COMMUNITY)
Admission: RE | Admit: 2012-02-17 | Discharge: 2012-02-17 | Disposition: A | Discharge status: Home / Self Care | Payer: Medicare Other | Source: Ambulatory Visit | Attending: Vascular Surgery | Admitting: Vascular Surgery

## 2012-02-17 ENCOUNTER — Encounter (HOSPITAL_COMMUNITY)
Admission: RE | Disposition: A | Discharge status: Home / Self Care | Payer: Self-pay | Source: Ambulatory Visit | Attending: Vascular Surgery

## 2012-02-17 ENCOUNTER — Other Ambulatory Visit: Payer: Self-pay | Admitting: Family Medicine

## 2012-02-17 DIAGNOSIS — N183 Chronic kidney disease, stage 3 unspecified: Secondary | ICD-10-CM

## 2012-02-17 DIAGNOSIS — E785 Hyperlipidemia, unspecified: Secondary | ICD-10-CM

## 2012-02-17 DIAGNOSIS — I252 Old myocardial infarction: Secondary | ICD-10-CM | POA: Insufficient documentation

## 2012-02-17 DIAGNOSIS — I2510 Atherosclerotic heart disease of native coronary artery without angina pectoris: Secondary | ICD-10-CM | POA: Insufficient documentation

## 2012-02-17 DIAGNOSIS — T82898A Other specified complication of vascular prosthetic devices, implants and grafts, initial encounter: Secondary | ICD-10-CM

## 2012-02-17 DIAGNOSIS — Y849 Medical procedure, unspecified as the cause of abnormal reaction of the patient, or of later complication, without mention of misadventure at the time of the procedure: Secondary | ICD-10-CM | POA: Insufficient documentation

## 2012-02-17 DIAGNOSIS — K219 Gastro-esophageal reflux disease without esophagitis: Secondary | ICD-10-CM | POA: Insufficient documentation

## 2012-02-17 DIAGNOSIS — I120 Hypertensive chronic kidney disease with stage 5 chronic kidney disease or end stage renal disease: Secondary | ICD-10-CM | POA: Insufficient documentation

## 2012-02-17 DIAGNOSIS — J449 Chronic obstructive pulmonary disease, unspecified: Secondary | ICD-10-CM | POA: Insufficient documentation

## 2012-02-17 DIAGNOSIS — N186 End stage renal disease: Secondary | ICD-10-CM | POA: Insufficient documentation

## 2012-02-17 DIAGNOSIS — Z794 Long term (current) use of insulin: Secondary | ICD-10-CM | POA: Insufficient documentation

## 2012-02-17 DIAGNOSIS — E119 Type 2 diabetes mellitus without complications: Secondary | ICD-10-CM | POA: Insufficient documentation

## 2012-02-17 DIAGNOSIS — J4489 Other specified chronic obstructive pulmonary disease: Secondary | ICD-10-CM | POA: Insufficient documentation

## 2012-02-17 HISTORY — PX: FISTULOGRAM: SHX5832

## 2012-02-17 SURGERY — FISTULOGRAM
Anesthesia: LOCAL

## 2012-02-17 MED ORDER — ROSUVASTATIN CALCIUM 20 MG PO TABS: 20.0000 mg | ORAL_TABLET | Freq: Every day | ORAL | Status: DC

## 2012-02-17 MED ORDER — HEPARIN (PORCINE) IN NACL 2-0.9 UNIT/ML-% IJ SOLN
INTRAMUSCULAR | Status: AC
  Filled 2012-02-17: qty 1000

## 2012-02-17 MED ORDER — SODIUM CHLORIDE 0.9 % IV SOLN
INTRAVENOUS | Status: DC
  Administered 2012-02-17: 06:00:00 via INTRAVENOUS

## 2012-02-17 MED ORDER — LIDOCAINE HCL (PF) 1 % IJ SOLN
INTRAMUSCULAR | Status: AC
  Filled 2012-02-17: qty 30

## 2012-02-17 NOTE — H&P (View-Only) (Signed)
[facility] [place]  Patient name: Kristi [name] MRN: [id number] DOB: 1941-01-18 Sex: female  REASON FOR VISIT: follow up of left radiocephalic AV fistula  HPI: Kristi [name] is a 71 y.o. female who had a left radiocephalic fistula placed in April 2012. The vein was noted to be fairly small. She is not yet on dialysis. She underwent a fistulogram which showed that she had 2 competing branches which were ligated in November. She comes in for continued follow up. She's had no complaints referrable to the fistula. She's had no recent uremic symptoms. Specifically she denies nausea, vomiting, fatigue, or anorexia.  Past Medical History  Diagnosis Date  . Hyperlipidemia   . Hyperparathyroidism   . CAD (coronary artery disease)   . Myocardial infarction 2008  . Hypertension   . COPD (chronic obstructive pulmonary disease)   . Diabetes mellitus     diagnosed 2010 - takes oral meds and lantus insulin  . Anemia   . Chronic kidney disease     esrd - since 2010  . GERD (gastroesophageal reflux disease)     on omeprazole  . Arthritis     all over  . CHF (congestive heart failure) 08-2011  . CVA 02/19/2010  . CAD 02/19/2010  . DEEP VENOUS THROMBOPHLEBITIS, LEG, LEFT 03/08/2010    in 2009 s/p hospitalization- only 1 isolated dvt  . Stroke ?2010    found remote CVA on CT scan    Family History  Problem Relation Age of Onset  . Cancer Mother     multiple myloma  . Dementia Father   . Pneumonia Father   . Anuerysm Father     SOCIAL HISTORY: History  Substance Use Topics  . Smoking status: Former Smoker -- 0.0 packs/day for 47 years    Types: Cigarettes    Quit date: 03/27/2011  . Smokeless tobacco: Current User  . Alcohol Use: No    No Known Allergies  Current Outpatient Prescriptions  Medication Sig Dispense Refill  . ADVAIR DISKUS 250-50 MCG/DOSE AEPB INHALE 1 PUFF TWICE A DAY  1 each  2  . albuterol (PROVENTIL HFA) 108 (90 BASE) MCG/ACT inhaler Inhale 2  puffs into the lungs every 4 (four) hours as needed. For wheezing       . alendronate (FOSAMAX) 70 MG tablet Take 1 tablet (70 mg total) by mouth every 7 (seven) days. In Am,with glass of water, on empty stomach, nothing by mouth or lie down for 30 min.  12 tablet  4  . amLODipine (NORVASC) 10 MG tablet Take 1 tablet (10 mg total) by mouth daily.  30 tablet  3  . aspirin 325 MG tablet Take 325 mg by mouth daily.        . B-D ULTRAFINE III SHORT PEN 31G X 8 MM MISC USE AS DIRECTED 4 TIMES A DAY  100 each  12  . fexofenadine (ALLEGRA) 180 MG tablet Take 1 tablet (180 mg total) by mouth daily.  90 tablet  4  . fish oil-omega-3 fatty acids 1000 MG capsule Take 3 g by mouth 2 (two) times daily.       . furosemide (LASIX) 40 MG tablet 40 mg daily.       Marland Kitchen glipiZIDE (GLUCOTROL XL) 5 MG 24 hr tablet TAKE 1 TABLET BY MOUTH EVERY DAY  30 tablet  5  . glucosamine-chondroitin 500-400 MG tablet Take 1 tablet by mouth 2 (two) times daily.        Marland Kitchen  glucose blood (ONE TOUCH TEST STRIPS) test strip Check blood sugar 4 times daily.  100 each  3  . LANTUS SOLOSTAR 100 UNIT/ML injection INJECT 13 UNITS EVERY MORNING AS DIRECTED  3 mL  15  . LIFESCAN FINEPOINT LANCETS MISC by Does not apply route. Check blood sugars four times a day       . metoprolol (LOPRESSOR) 25 MG tablet Take 25 mg by mouth 2 (two) times daily.        . nitroGLYCERIN (NITROSTAT) 0.4 MG SL tablet Place 0.4 mg under the tongue every 5 (five) minutes as needed. For chest pain     Call 911 if pain not relieved by 3rd tab      . omeprazole (PRILOSEC) 20 MG capsule ONE BY MOUTH DAILY  90 capsule  3  . Polyethyl Glycol-Propyl Glycol (SYSTANE OP) Apply 2 drops to eye 2 (two) times daily as needed. For dry or irritated eyes       . rosuvastatin (CRESTOR) 20 MG tablet Take 1 tablet (20 mg total) by mouth at bedtime.  30 tablet  11  . sertraline (ZOLOFT) 100 MG tablet Take 1 tablet (100 mg total) by mouth daily.  30 tablet  2  . vitamin B-12  (CYANOCOBALAMIN) 1000 MCG tablet Take 1,000 mcg by mouth daily.          REVIEW OF SYSTEMS: Arly.Keller ] denotes positive finding; [  ] denotes negative finding  CARDIOVASCULAR:  [ ]  chest pain   [ ]  chest pressure   [ ]  palpitations   [ ]  orthopnea  [ ]  dyspnea on exertion   CONSTITUTIONAL:  [ ]  fever   [ ]  chills  PHYSICAL EXAM: Filed Vitals:   02/12/12 1506  BP: 135/69  Pulse: 57  Resp: 16  Height: 5' (1.524 m)  Weight: 191 lb (86.637 kg)  SpO2: 90%   Body mass index is 37.30 kg/(m^2). GENERAL: The patient is a well-nourished female, in no acute distress. The vital signs are documented above. CARDIOVASCULAR: There is a regular rate and rhythm without significant murmur appreciated. She has palpable radial pulses bilaterally. PULMONARY: There is good air exchange bilaterally without wheezing or rales. Her right radiocephalic fistula has a palpable thrill up of the forearm. It is slightly pulsatile.  DATA:  Previous duplex had shown some elevated velocities in the native radial artery on the left likely related to the small size of the artery. There is also some elevated velocities at the anastomosis. Beyond that the vein appeared to be reasonable in size.  MEDICAL ISSUES: The the fistula still does not appear to be usable for access. This reason I recommended we proceed with a fistulogram and wheezing or a limited dye as he is not yet on dialysis. If we see any correctable problems then we would address these. If there are no correctable problems then she'll have to be considered for a basilic vein transposition on the left for access. She scheduled to see Dr. Eliott Nine in April. Last her creatinine was checked it was 2.4 and her renal function has remained relatively stable. We will make further recommendations pending results of her fistulogram which is scheduled for 02/17/2012.  Jerald Hennington S [facility] [place] [facility]: [phone]

## 2012-02-17 NOTE — Discharge Instructions (Signed)
AV Fistula Care After Refer to this sheet in the next few weeks. These instructions provide you with information on caring for yourself after your procedure. Your caregiver may also give you more specific instructions. Your treatment has been planned according to current medical practices, but problems sometimes occur. Call your caregiver if you have any problems or questions after your procedure. HOME CARE INSTRUCTIONS   Do not drive a car or take public transportation alone.   Do not drink alcohol.   Only take medicine that has been prescribed by your caregiver.   Do not sign important papers or make important decisions.   Have a responsible person with you.   Ask your caregiver to show you how to check your access at home for a vibration (called a "thrill") or for a sound (called a "bruit" pronounced brew-ee).   Your vein will need time to enlarge and mature so needles can be inserted for dialysis. Follow your caregiver's instructions about what you need to do to make this happen.   Keep dressings clean and dry.   Keep the arm elevated above your heart. Use a pillow.   Rest.   Use the arm as usual for all activities.   Have the stitches or tape closures removed in 10 to 14 days, or as directed by your caregiver.   Do not sleep or lie on the area of the fistula or that arm. This may decrease or stop the blood flow through your fistula.   Do not allow blood pressures to be taken on this arm.   Do not allow blood drawing to be done from the graft.   Do not wear tight clothing around the access site or on the arm.   Avoid lifting heavy objects with the arm that has the fistula.   Do not use creams or lotions over the access site.  SEEK MEDICAL CARE IF:   You have a fever.   You have swelling around the fistula that gets worse, or you have new pain.   You have unusual bleeding at the fistula site or from any other area.   You have pus or other drainage at the fistula  site.   You have skin redness or red streaking on the skin around, above, or below the fistula site.   Your access site feels warm.   You have any flu-like symptoms.  SEEK IMMEDIATE MEDICAL CARE IF:   You have pain, numbness, or an unusual pale skin on the hand or on the side of your fistula.   You have dizziness or weakness that you have not had before.   You have shortness of breath.   You have chest pain.   Your fistula disconnects or breaks, and there is bleeding that cannot be easily controlled.  Call for local emergency medical help. Do not try to drive yourself to the hospital. MAKE SURE YOU  Understand these instructions.   Will watch your condition.   Will get help right away if you are not doing well or get worse.  Document Released: 11/25/2005 Document Revised: 11/14/2011 Document Reviewed: 05/15/2011 [employer] Patient Information 2012 [employer]. 

## 2012-02-17 NOTE — Interval H&P Note (Signed)
History and Physical Interval Note:  02/17/2012 8:24 AM  Kristi [name]  has presented today for surgery, with the diagnosis of End stage renal  The various methods of treatment have been discussed with the patient and family. After consideration of risks, benefits and other options for treatment, the patient has consented to  Procedure(s) (LRB): FISTULOGRAM (N/A) as a surgical intervention .  The patients' history has been reviewed, patient examined, no change in status, stable for surgery.  I have reviewed the patients' chart and labs.  Questions were answered to the patient's satisfaction.     Kindall Swaby S

## 2012-02-17 NOTE — Op Note (Signed)
PATIENT: Kristi [name]   MRN: [id number] DOB: [birth year]    DATE OF PROCEDURE: 02/17/2012  INDICATIONS: Kristi [name] is a 71 y.o. female who had a left radiocephalic fistula placed in April of 2012. The vein was fairly small fistulogram demonstrated 2 competing branches which were ligated. Duplex of the fistula had demonstrated some elevated velocities at the proximal anastomosis and also in the native artery. Fistulogram was recommended to be sure there was not a stenosis that was limiting maturation of the fistula.  PROCEDURE:  1. Ultrasound-guided access to left radiocephalic AV fistula 2. Fistulogram of left radial cephalic AV fistula  SURGEON: Di Kindle. Edilia Bo, MD, FACS  ANESTHESIA: local   EBL: minimal  TECHNIQUE: The patient was brought to the [facility] lab. The left upper extremity was prepped and draped in the usual sterile fashion. After the skin was anesthetized with 1% lidocaine, and under ultrasound guidance, the left AV fistula was cannulated in the proximal third of the forearm with the needle angle towards the radial artery. The micropuncture sheath was introduced over a wire. Fistulogram was obtained initially with a blood pressure cuff inflated to reflux into the purulent anastomosis. The cuff was then released and fistulogram completed distally.  FINDINGS:  1. The native radial artery is somewhat small but there is no significant stenosis noted. There is no stenosis in the proximal AV fistula. 2. The AV fistula is widely patent with the dominant runoff into the basilic system. The upper arm cephalic vein branches off at the antecubital level is fairly small. 3. No central venous stenosis is noted.   Kristi Ferrari, MD, FACS [facility]  DATE OF DICTATION:   02/17/2012

## 2012-02-17 NOTE — Interval H&P Note (Signed)
History and Physical Interval Note:  02/17/2012 7:15 AM  Kristi [name]  has presented today for surgery, with the diagnosis of End stage renal  The various methods of treatment have been discussed with the patient and family. After consideration of risks, benefits and other options for treatment, the patient has consented to: FISTULOGRAM LEFT RADIAL-CEPHALIC AVF.  The patients' history has been reviewed, patient examined, no change in status, stable for surgery.  I have reviewed the patients' chart and labs.  Questions were answered to the patient's satisfaction.     [name]

## 2012-02-17 NOTE — Progress Notes (Signed)
D/C instructions given, pt verbalized understanding 

## 2012-02-18 LAB — GLUCOSE, CAPILLARY: Glucose-Capillary: 96 mg/dL (ref 70–99)

## 2012-02-18 LAB — SPEP & IFE WITH QIG
Alpha-1-Globulin: 5.7 % — ABNORMAL HIGH (ref 2.9–4.9)
Alpha-2-Globulin: 13.4 % — ABNORMAL HIGH (ref 7.1–11.8)
Beta Globulin: 6.5 % (ref 4.7–7.2)
IgG (Immunoglobin G), Serum: 1070 mg/dL (ref 690–1700)
M-Spike, %: 0.34 g/dL
Total Protein, Serum Electrophoresis: 6.6 g/dL (ref 6.0–8.3)

## 2012-02-18 LAB — KAPPA/LAMBDA LIGHT CHAINS: Kappa free light chain: 5.3 mg/dL — ABNORMAL HIGH (ref 0.33–1.94)

## 2012-02-19 LAB — POCT I-STAT, CHEM 8
BUN: 59 mg/dL — ABNORMAL HIGH (ref 6–23)
Chloride: 114 mEq/L — ABNORMAL HIGH (ref 96–112)
Creatinine, Ser: 2.3 mg/dL — ABNORMAL HIGH (ref 0.50–1.10)
Glucose, Bld: 109 mg/dL — ABNORMAL HIGH (ref 70–99)
Hemoglobin: 11.9 g/dL — ABNORMAL LOW (ref 12.0–15.0)
Potassium: 4.4 mEq/L (ref 3.5–5.1)
Sodium: 143 mEq/L (ref 135–145)

## 2012-03-04 ENCOUNTER — Other Ambulatory Visit: Payer: Self-pay | Admitting: Family Medicine

## 2012-04-02 ENCOUNTER — Encounter: Payer: Self-pay | Admitting: Home Health Services

## 2012-04-07 ENCOUNTER — Encounter: Payer: Self-pay | Admitting: Vascular Surgery

## 2012-04-08 ENCOUNTER — Encounter: Payer: Self-pay | Admitting: Vascular Surgery

## 2012-04-08 ENCOUNTER — Ambulatory Visit (INDEPENDENT_AMBULATORY_CARE_PROVIDER_SITE_OTHER): Payer: Medicare Other | Admitting: Vascular Surgery

## 2012-04-08 VITALS — BP 127/59 | HR 58 | Temp 98.0°F | Ht 60.0 in | Wt 191.0 lb

## 2012-04-08 DIAGNOSIS — N186 End stage renal disease: Secondary | ICD-10-CM

## 2012-04-08 NOTE — Progress Notes (Signed)
[facility] [place]  Patient name: Kristi [name] MRN: [id number] DOB: 02/10/1941 Sex: female  REASON FOR VISIT: follow up of left radial cephalic AV fistula.  HPI: Kristi [name] is a 71 y.o. female who had a left radiocephalic AV fistula placed in April of 2012. She then had ligation of 2 competing branches in her fistula in November of 2012. I saw her on 02/12/2012 in the fistula appeared to be slow to mature. We set her up for a fistulogram which was obtained on 02/17/2012. They showed that the fistula was widely patent and reasonable in size. The radial artery was noted to be small but there were no areas of significant stenosis noted within the fistula. The forearm cephalic vein empties into the basilic system. Thus there were no ways to improve the maturation of the fistula. The only real option would be to convert her to a basilic vein transposition.  Patient has had no pain associated with the fistula in the left arm. She's had no recent uremic symptoms except for some mild fatigue. This has been chronic. She was seen by Dr. Camille Bal approximately 3 weeks ago it was felt that her kidney function had remained relatively stable.   REVIEW OF SYSTEMS: Arly.Keller ] denotes positive finding; [  ] denotes negative finding  CARDIOVASCULAR:  [ ]  chest pain   [ ]  dyspnea on exertion    CONSTITUTIONAL:  [ ]  fever   [ ]  chills  PHYSICAL EXAM: Filed Vitals:   04/08/12 1106  BP: 127/59  Pulse: 58  Temp: 98 F (36.7 C)  TempSrc: Oral  Height: 5' (1.524 m)  Weight: 191 lb (86.637 kg)   Body mass index is 37.30 kg/(m^2). GENERAL: The patient is a well-nourished female, in no acute distress. The vital signs are documented above. CARDIOVASCULAR: There is a regular rate and rhythm  PULMONARY: There is good air exchange bilaterally without wheezing or rales. Her fistula has a palpable thrill in the left forearm which I can follow up to the antecubital level. The vein is moderate  in size. There is no pulsatility to suggest an outflow stenosis.  MEDICAL ISSUES: In my opinion, there is a reasonable chance that the fistula would be usable if she needed it for dialysis in the future. If the only other option would be to proceed with a basilic vein transposition I would favor continuing to allow the fistula to mature and only consider a basilic transposition if this were not usable. I have arranged for a follow visit in 4 months with a duplex scan of see her back at that time. Again the recent fistulogram did not show any stenosis or branches that would affect the maturation of her fistula. The vein was widely patent and although not large was a reasonable in size.  Jamika Sadek S [facility] [place] [facility]: [phone]

## 2012-04-19 ENCOUNTER — Other Ambulatory Visit: Payer: Self-pay | Admitting: Family Medicine

## 2012-04-19 MED ORDER — OMEPRAZOLE 20 MG PO CPDR: 20.0000 mg | DELAYED_RELEASE_CAPSULE | Freq: Every day | ORAL | Status: DC

## 2012-07-10 ENCOUNTER — Other Ambulatory Visit: Payer: Self-pay | Admitting: *Deleted

## 2012-07-13 MED ORDER — AMLODIPINE BESYLATE 10 MG PO TABS: 10.0000 mg | ORAL_TABLET | ORAL | Status: DC

## 2012-07-31 ENCOUNTER — Telehealth: Payer: Self-pay | Admitting: Family Medicine

## 2012-07-31 NOTE — Telephone Encounter (Signed)
Left vm for pt to return call, pt is eligible for a free 30 min f/u appt with Rosalita Chessman, letter was sent.

## 2012-08-11 ENCOUNTER — Encounter: Payer: Self-pay | Admitting: Vascular Surgery

## 2012-08-11 ENCOUNTER — Other Ambulatory Visit: Payer: Self-pay | Admitting: Family Medicine

## 2012-08-11 ENCOUNTER — Ambulatory Visit (INDEPENDENT_AMBULATORY_CARE_PROVIDER_SITE_OTHER): Payer: Medicare Other | Admitting: Family Medicine

## 2012-08-11 ENCOUNTER — Encounter: Payer: Self-pay | Admitting: Family Medicine

## 2012-08-11 VITALS — BP 153/62 | HR 51 | Ht 60.0 in | Wt 187.0 lb

## 2012-08-11 DIAGNOSIS — E785 Hyperlipidemia, unspecified: Secondary | ICD-10-CM

## 2012-08-11 DIAGNOSIS — R5383 Other fatigue: Secondary | ICD-10-CM

## 2012-08-11 DIAGNOSIS — E119 Type 2 diabetes mellitus without complications: Secondary | ICD-10-CM

## 2012-08-11 DIAGNOSIS — F329 Major depressive disorder, single episode, unspecified: Secondary | ICD-10-CM

## 2012-08-11 NOTE — Progress Notes (Signed)
  Subjective:    Patient ID: Kristi [name], female    DOB: 10-16-1941, 71 y.o.   MRN: [id number]  HPI  1. Depression - She would like to discontinue the sertraline. She is currently taking 100 mg each night. Kristi [name] started this medication in 2012, and her pH to 9 at that time was approximately 16.  She does note that she has a history of depression but does not feel this time that she is depressed. She claims that she just "doesn't care"about things like cleaning the house. However she does derive pleasure from watching television, but experienced, in going out with friends. She does note that her house is becoming very unkempt, which bothers her daughters. She denies any desire to quit taking her medication, and says that she is compliant.  2. DIABETES  Taking and tolerating: yes Glipizide 5 mg daily, Lantus 13 units daily Fasting blood sugars:Not measuring  Hypoglycemic symptoms: no Diet Changes: None Exercise: None Visual problems: no Monitoring feet: yes Numbness/Tingling: no Last eye exam: October 2012 Diabetic Labs:  Lab Results  Component Value Date   HGBA1C 6.2 08/11/2012   HGBA1C 5.6 02/11/2012   HGBA1C 6.8 11/08/2011   Lab Results  Component Value Date   LDLCALC 117* 01/18/2011   CREATININE 2.30* 02/17/2012   Last microalbumin: No results found for this basename: MICROALBUR, [id number]   Taking an ACE-I ?: Note, The patient with stage III chronic kidney disease      Review of Systems Positive for Dizziness upon sitting up out of bed which resolves within 30 seconds and is not limited her function; otherwise negative    Objective:   Physical Exam  BP 153/62  Pulse 51  Ht 5' (1.524 m)  Wt 187 lb (84.823 kg)  BMI 36.52 kg/m2 General: Elderly white female, obese body habitus, alert oriented x4, pleasant and conversant Cardiovascular: Regular rate and rhythm no murmurs rubs or gallops Pulmonary: Decreased history of GERD no wheezes Extremities: AV fistula on her  side the left wrist with palpable thrill, and pneumatosis of right great toe nail Psych: Normal mood, thought content, and behavior; no suicidal or homicidal ideation     Assessment & Plan:  71 year old female with resolving depression but is interested specific activities That is not anhedonia. She also has well-controlled diabetes.

## 2012-08-11 NOTE — Patient Instructions (Signed)
Dear Kristi [name],   Thank you for coming to clinic today. Please read below regarding the issues that we discussed.   1. Depression Medication - We can discontinue the depression medication if you would like. Please take 50 mg for the next 7 days and then 25 mg per day for 7 days.   2. High Cholesterol - Please schedule an appointment to have your blood drawn one morning when you are fasting so that we get an accurate reading.   Please follow up in clinic in 1 month. Please call earlier if you have any questions or concerns.   Sincerely,   Dr. Clinton Sawyer

## 2012-08-12 ENCOUNTER — Ambulatory Visit (INDEPENDENT_AMBULATORY_CARE_PROVIDER_SITE_OTHER): Payer: Medicare Other | Admitting: Vascular Surgery

## 2012-08-12 ENCOUNTER — Telehealth: Payer: Self-pay | Admitting: Family Medicine

## 2012-08-12 ENCOUNTER — Other Ambulatory Visit: Payer: Medicare Other

## 2012-08-12 ENCOUNTER — Other Ambulatory Visit: Payer: Self-pay | Admitting: *Deleted

## 2012-08-12 ENCOUNTER — Encounter: Payer: Self-pay | Admitting: Family Medicine

## 2012-08-12 ENCOUNTER — Encounter: Payer: Self-pay | Admitting: Vascular Surgery

## 2012-08-12 ENCOUNTER — Encounter (INDEPENDENT_AMBULATORY_CARE_PROVIDER_SITE_OTHER): Payer: Medicare Other | Admitting: *Deleted

## 2012-08-12 VITALS — BP 137/47 | HR 50 | Resp 16 | Ht 60.0 in | Wt 187.0 lb

## 2012-08-12 DIAGNOSIS — E119 Type 2 diabetes mellitus without complications: Secondary | ICD-10-CM

## 2012-08-12 DIAGNOSIS — N186 End stage renal disease: Secondary | ICD-10-CM

## 2012-08-12 DIAGNOSIS — R5383 Other fatigue: Secondary | ICD-10-CM

## 2012-08-12 DIAGNOSIS — E785 Hyperlipidemia, unspecified: Secondary | ICD-10-CM

## 2012-08-12 LAB — TSH: TSH: 1.993 u[IU]/mL (ref 0.350–4.500)

## 2012-08-12 MED ORDER — GLIPIZIDE ER 5 MG PO TB24: 5.0000 mg | ORAL_TABLET | Freq: Every day | ORAL | Status: DC

## 2012-08-12 NOTE — Assessment & Plan Note (Addendum)
Outside labs indicated the patient had a cholesterol panel drawn on 03/19/2012. Total cholesterol 143 triglycerides 122 HDL 37 VLDL 24 LDL 82. Given that her LDL is within goal, we'll continue treatment with Crestor 20 mg daily.

## 2012-08-12 NOTE — Assessment & Plan Note (Signed)
The patient's goal A1c is less than 7. Continue glipizide 5 mg Lantus 13 units daily as A1c is at goal. She will need a repeat exam November and I exam in October 2013. Her kidney function is monitored by [facility] at [facility].

## 2012-08-12 NOTE — Progress Notes (Signed)
[facility] of [place]  Patient name: Kristi [name] MRN: [id number] DOB: 04-Aug-1941 Sex: female  REASON FOR VISIT: follow up of left radiocephalic AV fistula  HPI: Kristi [name] is a 71 y.o. female who had a left radiocephalic AV fistula placed in April 2012. She had 2 competing branches ligated subsequently. Fistulogram March of 2013 showed a widely patent fistula with no areas of significant stenosis noted within the fistula. The radial artery was noted to be somewhat small. She comes in for a one-month follow up visit. She states her kidney function has been stable. She's had no recent uremic symptoms. She denies nausea, vomiting, fatigue, or anorexia.   REVIEW OF SYSTEMS: Arly.Keller ] denotes positive finding; [  ] denotes negative finding  CARDIOVASCULAR:  [ ]  chest pain   [ ]  dyspnea on exertion    CONSTITUTIONAL:  [ ]  fever   [ ]  chills  PHYSICAL EXAM: Filed Vitals:   08/12/12 1402  BP: 137/47  Pulse: 50  Resp: 16  Height: 5' (1.524 m)  Weight: 187 lb (84.823 kg)  SpO2: 95%   Body mass index is 36.52 kg/(m^2). GENERAL: The patient is a well-nourished female, in no acute distress. The vital signs are documented above. CARDIOVASCULAR: There is a regular rate and rhythm  PULMONARY: There is good air exchange bilaterally without wheezing or rales. The fistula has a reasonable thrill and could be palpated up to the antecubital level.  I have independently interpreted her duplex of her fistula which shows a diameters in the forearm range from 0.6-0.66 cm. Depths ranged from 0.29-0.51 cm. There no areas of significant increased callosity within the fistula suggest areas of stenosis.  MEDICAL ISSUES: End stage renal disease The left radiocephalic fistula has diameters in the forearm ranging from 0.6 so to 0.66 cm. Gets range from 0.29-0.51 cm. Thus the fistula appears to be adequate in size and also at a reasonable depth. I do not see any areas of significant stenosis  within the fistula. She is not yet on dialysis. If she does require dialysis I think it would be reasonable to attempt to cannulate the fistula. We will see her back as needed.     Sheera Illingworth S [facility] [place] [facility]: [phone]

## 2012-08-12 NOTE — Assessment & Plan Note (Addendum)
The patient is continuing to be treated by Dr. Carole Civil [employer] in [place]. She is also being evaluated by Dr. Ferne Coe the vascular surgery to assess the AV fistula in her left upper extremity. Currently she does not require hemodialysis.

## 2012-08-12 NOTE — Telephone Encounter (Signed)
Patient called to inform her that she does not need cholesterol panel drawn and patient notes that she has already come to the office for her lab draw this morning. I will cancel the order.

## 2012-08-12 NOTE — Assessment & Plan Note (Addendum)
PHQ9 score of 51 08/11/2012-Patient would like to discontinue sertraline. I have no problem with this. We'll continue to monitor as she tapers a dose or 2 weeks.I do not explain her apathy towards certain activities of daily living, But did not think that sertraline will help the problem. Check TSH.

## 2012-08-12 NOTE — Addendum Note (Signed)
Addended by: Olita Takeshita V on: 08/12/2012 09:47 PM   Modules accepted: Level of Service  

## 2012-08-12 NOTE — Progress Notes (Signed)
FLP AND TSH DONE TODAY Louie Meaders 

## 2012-08-12 NOTE — Assessment & Plan Note (Signed)
The left radiocephalic fistula has diameters in the forearm ranging from 0.6 so to 0.66 cm. Gets range from 0.29-0.51 cm. Thus the fistula appears to be adequate in size and also at a reasonable depth. I do not see any areas of significant stenosis within the fistula. She is not yet on dialysis. If she does require dialysis I think it would be reasonable to attempt to cannulate the fistula. We will see her back as needed.

## 2012-08-17 ENCOUNTER — Other Ambulatory Visit (HOSPITAL_COMMUNITY): Payer: Medicare Other

## 2012-08-17 ENCOUNTER — Other Ambulatory Visit: Payer: Medicare Other | Admitting: Lab

## 2012-08-25 ENCOUNTER — Telehealth: Payer: Self-pay | Admitting: Oncology

## 2012-08-25 ENCOUNTER — Ambulatory Visit (HOSPITAL_BASED_OUTPATIENT_CLINIC_OR_DEPARTMENT_OTHER): Payer: Medicare Other | Admitting: Oncology

## 2012-08-25 ENCOUNTER — Ambulatory Visit (HOSPITAL_BASED_OUTPATIENT_CLINIC_OR_DEPARTMENT_OTHER): Payer: Medicare Other | Admitting: Lab

## 2012-08-25 VITALS — BP 140/53 | HR 58 | Temp 97.0°F | Resp 22 | Ht 60.0 in | Wt 190.2 lb

## 2012-08-25 DIAGNOSIS — M79609 Pain in unspecified limb: Secondary | ICD-10-CM

## 2012-08-25 DIAGNOSIS — D472 Monoclonal gammopathy: Secondary | ICD-10-CM

## 2012-08-25 DIAGNOSIS — D649 Anemia, unspecified: Secondary | ICD-10-CM

## 2012-08-25 DIAGNOSIS — N189 Chronic kidney disease, unspecified: Secondary | ICD-10-CM

## 2012-08-25 LAB — COMPREHENSIVE METABOLIC PANEL (CC13)
ALT: 11 U/L (ref 0–55)
Albumin: 3.3 g/dL — ABNORMAL LOW (ref 3.5–5.0)
Alkaline Phosphatase: 86 U/L (ref 40–150)
Potassium: 4.3 mEq/L (ref 3.5–5.1)
Sodium: 141 mEq/L (ref 136–145)
Total Bilirubin: 0.4 mg/dL (ref 0.20–1.20)
Total Protein: 7.6 g/dL (ref 6.4–8.3)

## 2012-08-25 LAB — CBC WITH DIFFERENTIAL/PLATELET
EOS%: 2.7 % (ref 0.0–7.0)
Eosinophils Absolute: 0.3 10*3/uL (ref 0.0–0.5)
HGB: 12 g/dL (ref 11.6–15.9)
MCH: 27.4 pg (ref 25.1–34.0)
MCV: 84.2 fL (ref 79.5–101.0)
MONO%: 8.3 % (ref 0.0–14.0)
NEUT#: 7.9 10*3/uL — ABNORMAL HIGH (ref 1.5–6.5)
RBC: 4.38 10*6/uL (ref 3.70–5.45)
RDW: 16.5 % — ABNORMAL HIGH (ref 11.2–14.5)
lymph#: 1.8 10*3/uL (ref 0.9–3.3)
nRBC: 0 % (ref 0–0)

## 2012-08-25 NOTE — Telephone Encounter (Signed)
gv pt appt schedule for march 2014 and sent pt back to lb.

## 2012-08-25 NOTE — Progress Notes (Signed)
Hematology and Oncology Follow Up Visit  ARLITA BUFFKIN [id number] 09/04/1941 71 y.o. 08/25/2012 10:17 AM  CC: Duke Salvia. Eliott Nine, M.D.  Helane Rima, MD  Corky Crafts, MD    Principle Diagnosis: This is a 71 year old female with monoclonal gammopathy of undetermined significance.  She has an IgG lambda subtype without any evidence of end-organ damage. Her bone marrow biopsy done in June 2011 showed a very mild plasmacytosis.  Plasma cells ranged between 5% to 8%.    Secondary diagnosis: Includes chronic renal insufficiency due to longstanding hypertension and diabetes.  No evidence of a multiple myeloma at this time.  Interim History:  Ms. Hinesley presents today for a followup visit.  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  Her workup for multiple myeloma has been negative.  She did not have any evidence to suggest lytic bony lesions on her plain film x-rays.  Her bone marrow biopsy done in June 2011 showed a very mild plasmacytosis.  Plasma cells ranged between 5% to 8%.  No evidence of any again infiltrated plasma cell presence on the bone marrow. Clinically Ms. Woolworth reports that she is doing fairly well. No recent bone pain or pathological fracture or recent infection. She is reporting right foot pain and toe pain. Fevers or chills.     Medications: I have reviewed the patient's current medications. Current outpatient prescriptions:alendronate (FOSAMAX) 70 MG tablet, Take 70 mg by mouth every 7 (seven) days. In Am,with glass of water, on empty stomach, nothing by mouth or lie down for 30 min. Takes on Sunday., Disp: , Rfl: ;  amLODipine (NORVASC) 10 MG tablet, Take 1 tablet (10 mg total) by mouth every morning., Disp: 30 tablet, Rfl: 5;  aspirin 325 MG tablet, Take 325 mg by mouth at bedtime. , Disp: , Rfl:  calcitRIOL (ROCALTROL) 0.25 MCG capsule, , Disp: , Rfl: ;  fexofenadine (ALLEGRA) 180 MG tablet, Take 180 mg by mouth every morning., Disp: , Rfl: ;   Fluticasone-Salmeterol (ADVAIR) 250-50 MCG/DOSE AEPB, Inhale 1 puff into the lungs every morning., Disp: , Rfl: ;  furosemide (LASIX) 40 MG tablet, Take 40 mg by mouth every morning. , Disp: , Rfl: ;  glipiZIDE (GLUCOTROL XL) 5 MG 24 hr tablet, TAKE 1 TABLET BY MOUTH EVERY DAY, Disp: 30 tablet, Rfl: 5 glipiZIDE (GLUCOTROL XL) 5 MG 24 hr tablet, Take 5 mg by mouth 2 (two) times daily., Disp: , Rfl: ;  glucosamine-chondroitin 500-400 MG tablet, Take 1 tablet by mouth 2 (two) times daily.  , Disp: , Rfl: ;  insulin glargine (LANTUS) 100 UNIT/ML injection, Inject 13 Units into the skin every morning. If blood sugar is >140 add 1 unit. If blood sugar is <140 subtract 1 unit., Disp: , Rfl:  metoprolol (LOPRESSOR) 25 MG tablet, Take 25 mg by mouth 2 (two) times daily.  , Disp: , Rfl: ;  nitroGLYCERIN (NITROSTAT) 0.4 MG SL tablet, Place 0.4 mg under the tongue every 5 (five) minutes as needed. For chest pain     Call 911 if pain not relieved by 3rd tab, Disp: , Rfl: ;  Omega-3 Fatty Acids (FISH OIL) 1200 MG CAPS, Take 2 capsules by mouth 2 (two) times daily., Disp: , Rfl:  omeprazole (PRILOSEC) 20 MG capsule, Take 1 capsule (20 mg total) by mouth at bedtime., Disp: 90 capsule, Rfl: 4;  rosuvastatin (CRESTOR) 20 MG tablet, Take 1 tablet (20 mg total) by mouth at bedtime., Disp: 30 tablet, Rfl: 6;  sertraline (  ZOLOFT) 100 MG tablet, , Disp: , Rfl: ;  vitamin B-12 (CYANOCOBALAMIN) 1000 MCG tablet, Take 1,000 mcg by mouth every morning. , Disp: , Rfl:  albuterol (PROVENTIL HFA) 108 (90 BASE) MCG/ACT inhaler, Inhale 2 puffs into the lungs every 4 (four) hours as needed. For wheezing , Disp: , Rfl:   Allergies: No Known Allergies  Past Medical History, Surgical history, Social history, and Family History were reviewed and updated.  Review of Systems: Constitutional:  Negative for fever, chills, night sweats, anorexia, weight loss, pain. Cardiovascular: no chest pain or dyspnea on exertion Respiratory:  negative Neurological: negative Dermatological: negative ENT: negative Skin: Negative. Gastrointestinal: negative Genito-Urinary: negative Hematological and Lymphatic: negative Breast: negative Musculoskeletal: negative Remaining ROS negative. Physical Exam: Blood pressure 140/53, pulse 58, temperature 97 F (36.1 C), temperature source Oral, resp. rate 22, height 5' (1.524 m), weight 190 lb 3.2 oz (86.274 kg). ECOG: 1 General appearance: alert Head: Normocephalic, without obvious abnormality, atraumatic Neck: no adenopathy, no carotid bruit, no JVD, supple, symmetrical, trachea midline and thyroid not enlarged, symmetric, no tenderness/mass/nodules Lymph nodes: Cervical, supraclavicular, and axillary nodes normal. Heart:regular rate and rhythm, S1, S2 normal, no murmur, click, rub or gallop Lung:chest clear, no wheezing, rales, normal symmetric air entry Abdomin: soft, non-tender, without masses or organomegaly EXT:no erythema, induration, or nodules     Results for MARGUERITE, BARBA (MRN [id number]) as of 08/25/2012 10:18  Ref. Range 02/14/2012 14:56  Albumin ELP Latest Range: 55.8-66.1 % 53.6 (L)  COMMENT (PROTEIN ELECTROPHOR) No range found *  Alpha-1-Globulin Latest Range: 2.9-4.9 % 5.7 (H)  Alpha-2-Globulin Latest Range: 7.1-11.8 % 13.4 (H)  Beta Globulin Latest Range: 4.7-7.2 % 6.5  Beta 2 Latest Range: 3.2-6.5 % 5.7  Gamma Globulin Latest Range: 11.1-18.8 % 15.1  M-SPIKE, % No range found 0.34  SPE Interp. No range found *  IgG (Immunoglobin G), Serum Latest Range: [phone] mg/dL 7829  IgA Latest Range: 69-380 mg/dL 562  IgM, Serum Latest Range: 52-322 mg/dL 130  Total Protein, serum electrophor Latest Range: 6.0-8.3 g/dL 6.6  Kappa free light chain Latest Range: 0.33-1.94 mg/dL 8.65 (H)     Impression and Plan: This is a pleasant 71 year old female with the following issues:  1. IgG lambda monoclonal protein represents monoclonal gammopathy of undetermined  significance without any evidence of multiple myeloma.  Skeletal survey and bone marrow biopsy again failed to show evidence of myeloma.  So far her serum protein electrophoresis actually showed a smaller M-spike. I will continue to monitor her every 6 months, as we are still dealing with MGUS.  She still has the risk of developing multiple myeloma, so if she develops worsening anemia, worsening renal function that is not explained, then we will restage her at that time.  2. Chronic renal sufficiency, again followed by Dr. Eliott Nine.  Her last creatinine was around 2.0.  It may be she has some stabilization of her renal function at this time.  3. Anemia.  Her hemoglobin is stable.  She might need growth factor support if she is not already getting that.    4. Foot/toe pain: ?gout. She is seeing her PCP tomorrow.    [facility], MD 9/17/201310:17 AM

## 2012-08-26 ENCOUNTER — Encounter: Payer: Self-pay | Admitting: Family Medicine

## 2012-08-26 ENCOUNTER — Ambulatory Visit (INDEPENDENT_AMBULATORY_CARE_PROVIDER_SITE_OTHER): Payer: Medicare Other | Admitting: Family Medicine

## 2012-08-26 VITALS — BP 111/56 | HR 51 | Ht 60.0 in | Wt 188.0 lb

## 2012-08-26 DIAGNOSIS — M109 Gout, unspecified: Secondary | ICD-10-CM | POA: Insufficient documentation

## 2012-08-26 DIAGNOSIS — F329 Major depressive disorder, single episode, unspecified: Secondary | ICD-10-CM

## 2012-08-26 MED ORDER — PREDNISONE 20 MG PO TABS: 40.0000 mg | ORAL_TABLET | Freq: Every day | ORAL | Status: DC

## 2012-08-26 MED ORDER — COLCHICINE 0.6 MG PO TABS: 0.6000 mg | ORAL_TABLET | Freq: Every day | ORAL | Status: DC

## 2012-08-26 NOTE — Assessment & Plan Note (Signed)
The patient cannot tolerate NSAIDs, so we will try colchicine 0.6 mg x 2 for the first day and then 0.6 mg daily for 3-4 days. Additionally, she will use prednisone 40 mg daily x 5 days. I will check a renal panel and uric acid level in 2 weeks.

## 2012-08-26 NOTE — Patient Instructions (Signed)
Dear Mrs. Goldfarb,   Thank you for coming to clinic today. Please read below regarding the issues that we discussed.   1. Depression - I am glad that you are feeling well. Please let me know if anything changes.  2. Gout - We are going to try 2 medications for this. The first is colchicine. I want you to take 2 tablets today and 1 for the next 3-4 for days. Do not take it for longer than 5 days. The second medication is prednisone. I want you to take 40 mg (2 pills) for 5 days. This should help tremendously. Please follow up in 2 weeks so we can check your kidney labs and uric acid level.   Please follow up in clinic in 2 weeks. Please call earlier if you have any questions or concerns.   Sincerely,   Dr. Clinton Sawyer    Gout Gout is an inflammatory condition (arthritis) caused by a buildup of uric acid crystals in the joints. Uric acid is a chemical that is normally present in the blood. Under some circumstances, uric acid can form into crystals in your joints. This causes joint redness, soreness, and swelling (inflammation). Repeat attacks are common. Over time, uric acid crystals can form into masses (tophi) near a joint, causing disfigurement. Gout is treatable and often preventable. CAUSES  The disease begins with elevated levels of uric acid in the blood. Uric acid is produced by your body when it breaks down a naturally found substance called purines. This also happens when you eat certain foods such as meats and fish. Causes of an elevated uric acid level include:  Being passed down from parent to child (heredity).   Diseases that cause increased uric acid production (obesity, psoriasis, some cancers).   Excessive alcohol use.   Diet, especially diets rich in meat and seafood.   Medicines, including certain cancer-fighting drugs (chemotherapy), diuretics, and aspirin.   Chronic kidney disease. The kidneys are no longer able to remove uric acid well.   Problems with metabolism.    Conditions strongly associated with gout include:  Obesity.   High blood pressure.   High cholesterol.   Diabetes.  Not everyone with elevated uric acid levels gets gout. It is not understood why some people get gout and others do not. Surgery, joint injury, and eating too much of certain foods are some of the factors that can lead to gout. SYMPTOMS   An attack of gout comes on quickly. It causes intense pain with redness, swelling, and warmth in a joint.   Fever can occur.   Often, only one joint is involved. Certain joints are more commonly involved:   Base of the big toe.   Knee.   Ankle.   Wrist.   Finger.  Without treatment, an attack usually goes away in a few days to weeks. Between attacks, you usually will not have symptoms, which is different from many other forms of arthritis. DIAGNOSIS  Your caregiver will suspect gout based on your symptoms and exam. Removal of fluid from the joint (arthrocentesis) is done to check for uric acid crystals. Your caregiver will give you a medicine that numbs the area (local anesthetic) and use a needle to remove joint fluid for exam. Gout is confirmed when uric acid crystals are seen in joint fluid, using a special microscope. Sometimes, blood, urine, and X-ray tests are also used. TREATMENT  There are 2 phases to gout treatment: treating the sudden onset (acute) attack and preventing attacks (prophylaxis). Treatment  of an Acute Attack  Medicines are used. These include anti-inflammatory medicines or steroid medicines.   An injection of steroid medicine into the affected joint is sometimes necessary.   The painful joint is rested. Movement can worsen the arthritis.   You may use warm or cold treatments on painful joints, depending which works best for you.   Discuss the use of coffee, vitamin C, or cherries with your caregiver. These may be helpful treatment options.  Treatment to Prevent Attacks After the acute attack subsides,  your caregiver may advise prophylactic medicine. These medicines either help your kidneys eliminate uric acid from your body or decrease your uric acid production. You may need to stay on these medicines for a very long time. The early phase of treatment with prophylactic medicine can be associated with an increase in acute gout attacks. For this reason, during the first few months of treatment, your caregiver may also advise you to take medicines usually used for acute gout treatment. Be sure you understand your caregiver's directions. You should also discuss dietary treatment with your caregiver. Certain foods such as meats and fish can increase uric acid levels. Other foods such as dairy can decrease levels. Your caregiver can give you a list of foods to avoid. HOME CARE INSTRUCTIONS   Do not take aspirin to relieve pain. This raises uric acid levels.   Only take over-the-counter or prescription medicines for pain, discomfort, or fever as directed by your caregiver.   Rest the joint as much as possible. When in bed, keep sheets and blankets off painful areas.   Keep the affected joint raised (elevated).   Use crutches if the painful joint is in your leg.   Drink enough water and fluids to keep your urine clear or pale yellow. This helps your body get rid of uric acid. Do not drink alcoholic beverages. They slow the passage of uric acid.   Follow your caregiver's dietary instructions. Pay careful attention to the amount of protein you eat. Your daily diet should emphasize fruits, vegetables, whole grains, and fat-free or low-fat milk products.   Maintain a healthy body weight.  SEEK MEDICAL CARE IF:   You have an oral temperature above 102 F (38.9 C).   You develop diarrhea, vomiting, or any side effects from medicines.   You do not feel better in 24 hours, or you are getting worse.  SEEK IMMEDIATE MEDICAL CARE IF:   Your joint becomes suddenly more tender and you have:   Chills.     An oral temperature above 102 F (38.9 C), not controlled by medicine.  MAKE SURE YOU:   Understand these instructions.   Will watch your condition.   Will get help right away if you are not doing well or get worse.  Document Released: 11/22/2000 Document Revised: 11/14/2011 Document Reviewed: 03/05/2010 [facility] Patient Information 2012 [place], [place].

## 2012-08-26 NOTE — Progress Notes (Signed)
  Subjective:    Patient ID: Kristi [name], female    DOB: [birth year], 71 y.o.   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   Depression - She stopped sertraline since her last visit as was planned. She tapered on a 50 mg of sertraline for a week. She then stopped completely and denies any depressive symptoms. She denies feelings of depression and notes she has more energy than she did while taking the medication.   Right Foot Pain - This started three days ago spontaneously when she awoke from sleep. It is located at the base of the right first MTP joint. She notes swelling and tenderness. She has been taking tylenol for the pain, because she cannot take NSAIDs secondary to stage IV CKD. She denies history of trauma or previous episodes of gout. She does not drink alcohol.    Review of Systems Negative for SI/HI, anhedonia, extremity edema     Objective:   Physical Exam BP 111/56  Pulse 51  Ht 5' (1.524 m)  Wt 188 lb (85.276 kg)  BMI 36.72 kg/m2 Gen: elderly white female, non ill appearing, pleasant and conversant, obese body habitus Ext: erythema, warmth, and exquisite tenderness to palpation of right first MCP joint, pain with flexion and extension; 2+ edema dorsum of foot; no edema of ankles; left foot without ankle edema  Psych: normal affect, normal mood and conversation      Assessment & Plan:  71 year old female with improved depression and right MCP pain that is likely gout.

## 2012-08-26 NOTE — Assessment & Plan Note (Signed)
Given her improvement, we will stop the setraline.

## 2012-08-27 LAB — KAPPA/LAMBDA LIGHT CHAINS
Kappa:Lambda Ratio: 1.17 (ref 0.26–1.65)
Lambda Free Lght Chn: 6.42 mg/dL — ABNORMAL HIGH (ref 0.57–2.63)

## 2012-08-27 LAB — SPEP & IFE WITH QIG
Albumin ELP: 51.7 % — ABNORMAL LOW (ref 55.8–66.1)
Alpha-1-Globulin: 6.2 % — ABNORMAL HIGH (ref 2.9–4.9)
Alpha-2-Globulin: 13.2 % — ABNORMAL HIGH (ref 7.1–11.8)
Beta Globulin: 7.2 % (ref 4.7–7.2)
Total Protein, Serum Electrophoresis: 8 g/dL (ref 6.0–8.3)

## 2012-09-08 ENCOUNTER — Ambulatory Visit (INDEPENDENT_AMBULATORY_CARE_PROVIDER_SITE_OTHER): Payer: Medicare Other | Admitting: Family Medicine

## 2012-09-08 ENCOUNTER — Encounter: Payer: Self-pay | Admitting: Family Medicine

## 2012-09-08 VITALS — BP 146/73 | HR 54 | Temp 97.6°F | Ht 60.0 in | Wt 193.0 lb

## 2012-09-08 DIAGNOSIS — N184 Chronic kidney disease, stage 4 (severe): Secondary | ICD-10-CM

## 2012-09-08 DIAGNOSIS — F3289 Other specified depressive episodes: Secondary | ICD-10-CM

## 2012-09-08 DIAGNOSIS — F329 Major depressive disorder, single episode, unspecified: Secondary | ICD-10-CM

## 2012-09-08 DIAGNOSIS — M109 Gout, unspecified: Secondary | ICD-10-CM

## 2012-09-08 LAB — RENAL FUNCTION PANEL
Albumin: 4.1 g/dL (ref 3.5–5.2)
CO2: 24 mEq/L (ref 19–32)
Chloride: 107 mEq/L (ref 96–112)
Creat: 2.58 mg/dL — ABNORMAL HIGH (ref 0.50–1.10)
Glucose, Bld: 136 mg/dL — ABNORMAL HIGH (ref 70–99)

## 2012-09-08 NOTE — Assessment & Plan Note (Signed)
Check renal panel today. 

## 2012-09-08 NOTE — Progress Notes (Signed)
  Subjective:    Patient ID: Kristi [name], female    DOB: 1941/09/13, 71 y.o.   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 She presents today a follow up. She took prednisone 50 mg daily for 3 days, but stopped it because her blood sugars were > 350, which scared her. She took the colchicine BID for 3 days as well. She notes that she was pain free after a few days. The erythema has also resolved, but she notes persistent swelling of the top of her foot.    Review of Systems Positive for dizziness and rhinitis    Objective:   Physical Exam BP 146/73  Pulse 54  Temp 97.6 F (36.4 C) (Oral)  Ht 5' (1.524 m)  Wt 193 lb (87.544 kg)  BMI 37.69 kg/m2 Gen: eldery WF, non ill appearing, obese body habitus, pleasant and conversant HEENT: PEERLA, EOMI, no nystagmus  Feet: no tumor, rubor or calor of left foot; 2+ pitting edema of dorsum of feet bilaterally Psych: pleasant affect, smiling, normal thought content and behavior     Assessment & Plan:  71 year old F with resolving gout flare, which was her first.

## 2012-09-08 NOTE — Assessment & Plan Note (Signed)
Patient continue to report improved mood and motivation after stopping sertraline.

## 2012-09-08 NOTE — Patient Instructions (Addendum)
Dear Mrs. Jerez,   Thank you for coming to clinic today. Please read below regarding the issues that we discussed.   1. Gout - Stop the prednisone and colchicine. I will check your uric acid level today, since this can cause gout flares.   2. Kidney Function - I want to check on your kidneys to make sure they're not getting worse. I will let you know the results later in the week.   Sincerely,   Dr. Clinton Sawyer

## 2012-09-08 NOTE — Assessment & Plan Note (Signed)
Gout flare resolved. Check uric acid today to see if patient needs daily controller med.

## 2012-09-09 ENCOUNTER — Other Ambulatory Visit: Payer: Self-pay | Admitting: Family Medicine

## 2012-09-09 NOTE — Telephone Encounter (Signed)
I called Kristi [name]'s to discuss her recent lab results. I also have a question about her fosamax prescription. I asked her to return my call at the office.

## 2012-09-11 ENCOUNTER — Encounter: Payer: Self-pay | Admitting: Family Medicine

## 2012-09-11 ENCOUNTER — Telehealth: Payer: Self-pay | Admitting: Family Medicine

## 2012-09-11 DIAGNOSIS — E790 Hyperuricemia without signs of inflammatory arthritis and tophaceous disease: Secondary | ICD-10-CM | POA: Insufficient documentation

## 2012-09-11 DIAGNOSIS — M810 Age-related osteoporosis without current pathological fracture: Secondary | ICD-10-CM

## 2012-09-11 NOTE — Telephone Encounter (Signed)
I called the patient to discuss several matters.   1. High uric acid - She may have another attack given this level. However, since the first one was mild, I don't want to put her on allopurinol and jeopardize her kidneys, which are already stage IV CKD. She was in agreement  2. Osteoporosis - This is the indication for which the patient was taking alendronate. It was started by a provider at [facility] > 5 years ago. Since it has been greater then 5 years, and I have no record of her Dexa scan, I will discontinue this medication. Additionally, I will follow her calcium and follow up DXA scan in 2 years. She was in agreement.

## 2012-09-11 NOTE — Telephone Encounter (Signed)
Will forward to Dr Williamson 

## 2012-09-11 NOTE — Telephone Encounter (Signed)
Patient is calling Dr. Clinton Sawyer back about the lab results.

## 2012-09-11 NOTE — Assessment & Plan Note (Signed)
Uric acid 9.5. Only one mild gout flare, so won't start med now. Be cautious with meds since stage IV CKD.

## 2012-09-11 NOTE — Assessment & Plan Note (Signed)
Reported by patient. No records of bone density scan. Patient has been on alendronate for > 5 years. Stop it since not added benefit.

## 2012-09-29 ENCOUNTER — Encounter: Payer: Self-pay | Admitting: Family Medicine

## 2012-09-29 DIAGNOSIS — E11319 Type 2 diabetes mellitus with unspecified diabetic retinopathy without macular edema: Secondary | ICD-10-CM | POA: Insufficient documentation

## 2012-10-12 ENCOUNTER — Other Ambulatory Visit: Payer: Self-pay | Admitting: *Deleted

## 2012-10-12 DIAGNOSIS — E785 Hyperlipidemia, unspecified: Secondary | ICD-10-CM

## 2012-10-12 MED ORDER — ROSUVASTATIN CALCIUM 20 MG PO TABS: 20.0000 mg | ORAL_TABLET | Freq: Every day | ORAL | Status: DC

## 2012-10-30 ENCOUNTER — Emergency Department (HOSPITAL_COMMUNITY): Payer: Medicare Other

## 2012-10-30 ENCOUNTER — Encounter (HOSPITAL_COMMUNITY): Payer: Self-pay | Admitting: Emergency Medicine

## 2012-10-30 ENCOUNTER — Other Ambulatory Visit: Payer: Self-pay

## 2012-10-30 ENCOUNTER — Observation Stay (HOSPITAL_COMMUNITY)
Admission: EM | Admit: 2012-10-30 | Discharge: 2012-11-01 | Disposition: A | Discharge status: Home / Self Care | Payer: Medicare Other | Attending: Family Medicine | Admitting: Family Medicine

## 2012-10-30 DIAGNOSIS — M810 Age-related osteoporosis without current pathological fracture: Secondary | ICD-10-CM | POA: Insufficient documentation

## 2012-10-30 DIAGNOSIS — F3289 Other specified depressive episodes: Secondary | ICD-10-CM | POA: Insufficient documentation

## 2012-10-30 DIAGNOSIS — I10 Essential (primary) hypertension: Secondary | ICD-10-CM | POA: Diagnosis present

## 2012-10-30 DIAGNOSIS — I2510 Atherosclerotic heart disease of native coronary artery without angina pectoris: Secondary | ICD-10-CM | POA: Insufficient documentation

## 2012-10-30 DIAGNOSIS — J449 Chronic obstructive pulmonary disease, unspecified: Secondary | ICD-10-CM | POA: Insufficient documentation

## 2012-10-30 DIAGNOSIS — Z8673 Personal history of transient ischemic attack (TIA), and cerebral infarction without residual deficits: Secondary | ICD-10-CM | POA: Insufficient documentation

## 2012-10-30 DIAGNOSIS — N184 Chronic kidney disease, stage 4 (severe): Secondary | ICD-10-CM | POA: Insufficient documentation

## 2012-10-30 DIAGNOSIS — K219 Gastro-esophageal reflux disease without esophagitis: Secondary | ICD-10-CM | POA: Insufficient documentation

## 2012-10-30 DIAGNOSIS — E785 Hyperlipidemia, unspecified: Secondary | ICD-10-CM

## 2012-10-30 DIAGNOSIS — E11319 Type 2 diabetes mellitus with unspecified diabetic retinopathy without macular edema: Secondary | ICD-10-CM | POA: Insufficient documentation

## 2012-10-30 DIAGNOSIS — E669 Obesity, unspecified: Secondary | ICD-10-CM | POA: Insufficient documentation

## 2012-10-30 DIAGNOSIS — D631 Anemia in chronic kidney disease: Secondary | ICD-10-CM | POA: Insufficient documentation

## 2012-10-30 DIAGNOSIS — J3489 Other specified disorders of nose and nasal sinuses: Secondary | ICD-10-CM | POA: Insufficient documentation

## 2012-10-30 DIAGNOSIS — F17200 Nicotine dependence, unspecified, uncomplicated: Secondary | ICD-10-CM | POA: Insufficient documentation

## 2012-10-30 DIAGNOSIS — Z794 Long term (current) use of insulin: Secondary | ICD-10-CM | POA: Insufficient documentation

## 2012-10-30 DIAGNOSIS — J4489 Other specified chronic obstructive pulmonary disease: Secondary | ICD-10-CM | POA: Insufficient documentation

## 2012-10-30 DIAGNOSIS — I509 Heart failure, unspecified: Principal | ICD-10-CM | POA: Insufficient documentation

## 2012-10-30 DIAGNOSIS — E1039 Type 1 diabetes mellitus with other diabetic ophthalmic complication: Secondary | ICD-10-CM | POA: Insufficient documentation

## 2012-10-30 DIAGNOSIS — F329 Major depressive disorder, single episode, unspecified: Secondary | ICD-10-CM | POA: Insufficient documentation

## 2012-10-30 DIAGNOSIS — R7989 Other specified abnormal findings of blood chemistry: Secondary | ICD-10-CM | POA: Insufficient documentation

## 2012-10-30 DIAGNOSIS — Z86718 Personal history of other venous thrombosis and embolism: Secondary | ICD-10-CM | POA: Insufficient documentation

## 2012-10-30 DIAGNOSIS — I129 Hypertensive chronic kidney disease with stage 1 through stage 4 chronic kidney disease, or unspecified chronic kidney disease: Secondary | ICD-10-CM | POA: Insufficient documentation

## 2012-10-30 DIAGNOSIS — R0602 Shortness of breath: Secondary | ICD-10-CM

## 2012-10-30 LAB — CBC
Hemoglobin: 10 g/dL — ABNORMAL LOW (ref 12.0–15.0)
MCHC: 31.8 g/dL (ref 30.0–36.0)
Platelets: 170 10*3/uL (ref 150–400)
RDW: 17.8 % — ABNORMAL HIGH (ref 11.5–15.5)

## 2012-10-30 LAB — BASIC METABOLIC PANEL
GFR calc Af Amer: 20 mL/min — ABNORMAL LOW (ref >90)
GFR calc non Af Amer: 17 mL/min — ABNORMAL LOW (ref >90)
Potassium: 4.4 mEq/L (ref 3.5–5.1)
Sodium: 142 mEq/L (ref 135–145)

## 2012-10-30 LAB — POCT I-STAT TROPONIN I: Troponin i, poc: 0.02 ng/mL (ref 0.00–0.08)

## 2012-10-30 MED ORDER — FUROSEMIDE 10 MG/ML IJ SOLN
40.0000 mg | Freq: Once | INTRAMUSCULAR | Status: AC
  Administered 2012-10-31: 40 mg via INTRAVENOUS
  Filled 2012-10-30: qty 4

## 2012-10-30 MED ORDER — ALBUTEROL SULFATE (5 MG/ML) 0.5% IN NEBU
5.0000 mg | INHALATION_SOLUTION | Freq: Once | RESPIRATORY_TRACT | Status: AC
  Administered 2012-10-30: 5 mg via RESPIRATORY_TRACT
  Filled 2012-10-30: qty 1

## 2012-10-30 NOTE — ED Notes (Signed)
Pt reports SOB with exertion mostly for about a month, that has gotten progressively worse; reports some swelling in legs but reports had gout flare up

## 2012-10-30 NOTE — ED Provider Notes (Signed)
History     CSN: [id number]  Arrival date & time 10/30/12  7253   First MD Initiated Contact with Patient 10/30/12 2218      Chief Complaint  Patient presents with  . Shortness of Breath    (Consider location/radiation/quality/duration/timing/severity/associated sxs/prior treatment) HPI The patient presents to the ED with a chief complain of dyspnea for one month with worsening symptoms today.  She reports an increase in dyspnea with exertion, today she could not "catch her breath" after taking her trash out.  Denies orthopnea or PND.She denies chest pain, LE edema, palpitations, abdominal pain, nausea, vomiting, dizziness, syncope, or weight gain.  She denies home oxygen and reports being compliant with her medication.  Past Medical History  Diagnosis Date  . Hyperlipidemia   . Hyperparathyroidism   . CAD (coronary artery disease)   . Myocardial infarction 2008  . Hypertension   . COPD (chronic obstructive pulmonary disease)   . Diabetes mellitus     diagnosed 2010 - takes oral meds and lantus insulin  . Anemia   . Chronic kidney disease     esrd - since 2010  . GERD (gastroesophageal reflux disease)     on omeprazole  . Arthritis     all over  . CHF (congestive heart failure) 08-2011  . CVA 02/19/2010  . CAD 02/19/2010  . DEEP VENOUS THROMBOPHLEBITIS, LEG, LEFT 03/08/2010    in 2009 s/p hospitalization- only 1 isolated dvt  . Stroke ?2010    found remote CVA on CT scan    Past Surgical History  Procedure Date  . Av fistula placement 04/05/11    Left Radiocephalic AVF  . Appendectomy 1969  . Tubal ligation   . Revision of fisturl   . Coronary angioplasty with stent placement 06/23/2007    by Dr. Eldridge Dace is her cardiologist  . Ligation goretex fistula 10/29/2011    Procedure: LIGATION GORE-TEX FISTULA;  Surgeon: Sherren Kerns, MD;  Location: [facility];  Service: Vascular;  Laterality: Left;  Ligation of competing branches left forearm fistula    Family History    Problem Relation Age of Onset  . Cancer Mother     multiple myloma  . Dementia Father   . Pneumonia Father   . Anuerysm Father     History  Substance Use Topics  . Smoking status: Former Smoker -- 0.0 packs/day for 47 years    Types: Cigarettes    Quit date: 03/27/2011  . Smokeless tobacco: Never Used  . Alcohol Use: No    OB History    Grav Para Term Preterm Abortions TAB SAB Ect Mult Living                  Review of Systems ROS All other systems negative except as documented in the HPI. All pertinent positives and negatives as reviewed in the HPI.  Allergies  Review of patient's allergies indicates no known allergies.  Home Medications   Current Outpatient Rx  Name  Route  Sig  Dispense  Refill  . ALBUTEROL SULFATE HFA 108 (90 BASE) MCG/ACT IN AERS   Inhalation   Inhale 2 puffs into the lungs every 4 (four) hours as needed. For wheezing          . AMLODIPINE BESYLATE 10 MG PO TABS   Oral   Take 1 tablet (10 mg total) by mouth every morning.   30 tablet   5   . ASPIRIN 325 MG PO TABS   Oral  Take 325 mg by mouth at bedtime.          Marland Kitchen CALCITRIOL 0.25 MCG PO CAPS   Oral   Take 0.25 mcg by mouth daily.          Marland Kitchen FEXOFENADINE HCL 180 MG PO TABS   Oral   Take 180 mg by mouth every morning.         Marland Kitchen FLUTICASONE-SALMETEROL 250-50 MCG/DOSE IN AEPB   Inhalation   Inhale 1 puff into the lungs every morning.         . FUROSEMIDE 40 MG PO TABS   Oral   Take 40 mg by mouth every morning.          Marland Kitchen GLIPIZIDE ER 5 MG PO TB24   Oral   Take 5 mg by mouth daily.          Marland Kitchen GLUCOSAMINE-CHONDROITIN 500-400 MG PO TABS   Oral   Take 1 tablet by mouth 2 (two) times daily.           . INSULIN GLARGINE 100 UNIT/ML  SOLN   Subcutaneous   Inject 13 Units into the skin every morning. If blood sugar is >140 add 1 unit. If blood sugar is <140 subtract 1 unit.         Marland Kitchen METOPROLOL TARTRATE 25 MG PO TABS   Oral   Take 25 mg by mouth 2 (two)  times daily.           Marland Kitchen NITROGLYCERIN 0.4 MG SL SUBL   Sublingual   Place 0.4 mg under the tongue every 5 (five) minutes as needed. For chest pain     Call 911 if pain not relieved by 3rd tab         . FISH OIL 1200 MG PO CAPS   Oral   Take 2 capsules by mouth 2 (two) times daily.         Marland Kitchen OMEPRAZOLE 20 MG PO CPDR   Oral   Take 1 capsule (20 mg total) by mouth at bedtime.   90 capsule   4   . SYSTANE OP   Ophthalmic   Apply 1-2 drops to eye 4 (four) times daily as needed. For dry eyes         . ROSUVASTATIN CALCIUM 20 MG PO TABS   Oral   Take 1 tablet (20 mg total) by mouth at bedtime.   30 tablet   6   . SERTRALINE HCL 100 MG PO TABS   Oral   Take 1 tablet by mouth daily.         Marland Kitchen VITAMIN B-12 1000 MCG PO TABS   Oral   Take 1,000 mcg by mouth every morning.            BP 143/55  Pulse 68  Temp 98.6 F (37 C) (Oral)  Resp 24  SpO2 89%  Physical Exam  Constitutional: She appears well-developed and well-nourished.  HENT:  Head: Normocephalic and atraumatic.  Neck: Normal range of motion.  Cardiovascular: Normal rate, regular rhythm, S1 normal, S2 normal and normal heart sounds.  Exam reveals no gallop, no S3, no S4 and no friction rub.   No murmur heard. Pulmonary/Chest: Effort normal. No respiratory distress. She has no wheezes. She has no rhonchi.       Crackles heard at bases    ED Course  Procedures (including critical care time)  Labs Reviewed  CBC - Abnormal; Notable for the following:  RBC 3.65 (*)     Hemoglobin 10.0 (*)     HCT 31.4 (*)     RDW 17.8 (*)     All other components within normal limits  BASIC METABOLIC PANEL - Abnormal; Notable for the following:    Glucose, Bld 138 (*)     BUN 52 (*)     Creatinine, Ser 2.61 (*)     GFR calc non Af Amer 17 (*)     GFR calc Af Amer 20 (*)     All other components within normal limits  PRO B NATRIURETIC PEPTIDE - Abnormal; Notable for the following:    Pro B Natriuretic  peptide (BNP) 6615.0 (*)     All other components within normal limits  POCT I-STAT TROPONIN I   Dg Chest 2 View  10/30/2012  *RADIOLOGY REPORT*  Clinical Data: Shortness of breath.  History of smoking. Hypertension and diabetes.  CHEST - 2 VIEW  Comparison: 10/23/2011  Findings: Borderline heart size with mild increasing pulmonary vascularity since previous study.  Developing interstitial changes suggesting mild edema.  Mild hyperinflation suggesting early emphysema.  Degenerative changes in the spine.  No focal airspace consolidation.  No blunting of costophrenic angles.  No pneumothorax.  Mediastinal contours appear intact.  Calcified aorta.  IMPRESSION: Borderline heart size with suggestion of mild pulmonary vascular congestion and early interstitial edema.   Original Report Authenticated By: Burman Nieves, M.D.      Patient will be admitted to the hospital for CHF. Spoke with FP Resident about the patient and will be down to see the patient.   MDM  MDM Reviewed: vitals and nursing note Interpretation: labs, x-ray and ECG Consults: admitting MD            Carlyle Dolly, PA-C 10/31/12 0015

## 2012-10-31 ENCOUNTER — Encounter (HOSPITAL_COMMUNITY): Payer: Self-pay | Admitting: Nurse Practitioner

## 2012-10-31 DIAGNOSIS — I5023 Acute on chronic systolic (congestive) heart failure: Secondary | ICD-10-CM

## 2012-10-31 LAB — COMPREHENSIVE METABOLIC PANEL
ALT: 10 U/L (ref 0–35)
AST: 12 U/L (ref 0–37)
Albumin: 2.9 g/dL — ABNORMAL LOW (ref 3.5–5.2)
Alkaline Phosphatase: 72 U/L (ref 39–117)
Calcium: 9.7 mg/dL (ref 8.4–10.5)
GFR calc Af Amer: 20 mL/min — ABNORMAL LOW (ref >90)
Glucose, Bld: 109 mg/dL — ABNORMAL HIGH (ref 70–99)
Potassium: 3.9 mEq/L (ref 3.5–5.1)
Sodium: 143 mEq/L (ref 135–145)
Total Protein: 6.7 g/dL (ref 6.0–8.3)

## 2012-10-31 LAB — GLUCOSE, CAPILLARY
Glucose-Capillary: 102 mg/dL — ABNORMAL HIGH (ref 70–99)
Glucose-Capillary: 104 mg/dL — ABNORMAL HIGH (ref 70–99)

## 2012-10-31 LAB — CBC
HCT: 29.4 % — ABNORMAL LOW (ref 36.0–46.0)
Hemoglobin: 9.3 g/dL — ABNORMAL LOW (ref 12.0–15.0)
MCHC: 31.6 g/dL (ref 30.0–36.0)
MCV: 86.2 fL (ref 78.0–100.0)
RDW: 17.9 % — ABNORMAL HIGH (ref 11.5–15.5)

## 2012-10-31 MED ORDER — FUROSEMIDE 10 MG/ML IJ SOLN
40.0000 mg | Freq: Once | INTRAMUSCULAR | Status: AC
  Administered 2012-10-31: 40 mg via INTRAVENOUS

## 2012-10-31 MED ORDER — ATORVASTATIN CALCIUM 40 MG PO TABS
40.0000 mg | ORAL_TABLET | Freq: Every day | ORAL | Status: DC
  Administered 2012-10-31: 40 mg via ORAL
  Filled 2012-10-31 (×2): qty 1

## 2012-10-31 MED ORDER — SERTRALINE HCL 100 MG PO TABS
100.0000 mg | ORAL_TABLET | Freq: Every day | ORAL | Status: DC
  Filled 2012-10-31 (×2): qty 1

## 2012-10-31 MED ORDER — ASPIRIN 325 MG PO TABS
325.0000 mg | ORAL_TABLET | Freq: Every day | ORAL | Status: DC
  Administered 2012-10-31 (×2): 325 mg via ORAL
  Filled 2012-10-31 (×3): qty 1

## 2012-10-31 MED ORDER — PANTOPRAZOLE SODIUM 40 MG PO TBEC
40.0000 mg | DELAYED_RELEASE_TABLET | Freq: Every day | ORAL | Status: DC
  Administered 2012-10-31: 40 mg via ORAL
  Filled 2012-10-31 (×2): qty 1

## 2012-10-31 MED ORDER — ALBUTEROL SULFATE HFA 108 (90 BASE) MCG/ACT IN AERS: 2.0000 | INHALATION_SPRAY | RESPIRATORY_TRACT | Status: DC | PRN

## 2012-10-31 MED ORDER — INSULIN GLARGINE 100 UNIT/ML ~~LOC~~ SOLN
13.0000 [IU] | SUBCUTANEOUS | Status: DC
  Administered 2012-10-31 – 2012-11-01 (×2): 13 [IU] via SUBCUTANEOUS

## 2012-10-31 MED ORDER — ACETAMINOPHEN 325 MG PO TABS: 650.0000 mg | ORAL_TABLET | Freq: Four times a day (QID) | ORAL | Status: DC | PRN

## 2012-10-31 MED ORDER — ACETAMINOPHEN 650 MG RE SUPP: 650.0000 mg | Freq: Four times a day (QID) | RECTAL | Status: DC | PRN

## 2012-10-31 MED ORDER — METOPROLOL TARTRATE 25 MG PO TABS
25.0000 mg | ORAL_TABLET | Freq: Two times a day (BID) | ORAL | Status: DC
  Administered 2012-10-31 (×3): 25 mg via ORAL
  Filled 2012-10-31 (×6): qty 1

## 2012-10-31 MED ORDER — MOMETASONE FURO-FORMOTEROL FUM 100-5 MCG/ACT IN AERO
2.0000 | INHALATION_SPRAY | Freq: Two times a day (BID) | RESPIRATORY_TRACT | Status: DC
  Administered 2012-10-31 – 2012-11-01 (×3): 2 via RESPIRATORY_TRACT
  Filled 2012-10-31 (×2): qty 8.8

## 2012-10-31 MED ORDER — ONDANSETRON HCL 4 MG PO TABS: 4.0000 mg | ORAL_TABLET | Freq: Four times a day (QID) | ORAL | Status: DC | PRN

## 2012-10-31 MED ORDER — ONDANSETRON HCL 4 MG/2ML IJ SOLN: 4.0000 mg | Freq: Four times a day (QID) | INTRAMUSCULAR | Status: DC | PRN

## 2012-10-31 MED ORDER — SODIUM CHLORIDE 0.9 % IJ SOLN
3.0000 mL | Freq: Two times a day (BID) | INTRAMUSCULAR | Status: DC
  Administered 2012-10-31 – 2012-11-01 (×3): 3 mL via INTRAVENOUS

## 2012-10-31 MED ORDER — CALCITRIOL 0.25 MCG PO CAPS
0.2500 ug | ORAL_CAPSULE | Freq: Every day | ORAL | Status: DC
  Filled 2012-10-31 (×2): qty 1

## 2012-10-31 MED ORDER — HEPARIN SODIUM (PORCINE) 5000 UNIT/ML IJ SOLN
5000.0000 [IU] | Freq: Three times a day (TID) | INTRAMUSCULAR | Status: DC
  Administered 2012-10-31 – 2012-11-01 (×5): 5000 [IU] via SUBCUTANEOUS
  Filled 2012-10-31 (×7): qty 1

## 2012-10-31 MED ORDER — AMLODIPINE BESYLATE 10 MG PO TABS
10.0000 mg | ORAL_TABLET | Freq: Every day | ORAL | Status: DC
  Administered 2012-10-31: 10 mg via ORAL
  Filled 2012-10-31 (×2): qty 1

## 2012-10-31 NOTE — Progress Notes (Signed)
  Echocardiogram 2D Echocardiogram has been performed.  [place]ian Co 10/31/2012, 5:21 PM

## 2012-10-31 NOTE — ED Provider Notes (Signed)
Medical screening examination/treatment/procedure(s) were conducted as a shared visit with non-physician practitioner(s) and myself.  I personally evaluated the patient during the encounter.  Hx and pe c/w chf.  IV lasix.  admit  Donnetta Hutching, MD 10/31/12 [phone]

## 2012-10-31 NOTE — ED Notes (Signed)
Admitting MD in with pt    

## 2012-10-31 NOTE — H&P (Signed)
Family Medicine Teaching [facility] Admission History and Physical  Patient name: Kristi [name] Medical record number: [id number] Date of birth: 1940/12/31 Age: 71 y.o. Gender: female  Primary Care Provider: Mat Carne, MD  Chief Complaint:  Dyspnea  History of Present Illness: Kristi [name] is a 71 y.o. year old female with history of Stage IV CKD, questionable CHF, COPD, DM2 presenting from home with increasing exertional dyspnea for past few weeks. Today was more severe and had trouble getting back into her house from the mailbox, had to stop several times to catch her breath. Has a slight increased LE edema, notes 10 lb weight gain in past few months, and rare nonproductive cough. Endorses compliance with lasix 40 mg daily and no dietary changes including excess fluid or salt intake, though she admits frequently drinks a lot of water. Denies any episodes chest pain, has history MI in 2008. States she has a diagnosis of CHF from many years ago, but last echo in 2011 shows preserved EF and normal diastolic parameters. She denies any decrease in urine output, and other than a gout flare last month denies any other active issues.  On arrival to ED she was very dyspneic and found to have O2 sat 89% on RA and started on [place]. Lasix x 1 administered. Patient is asymptomatic at rest. FPC called to admit.  Denies palpitations, chest pain, abd pain, nausea, diaphoresis, dysuria, difficulty voiding, wheezing, productive cough, fever.   Stopped smoking in 2008.  Patient Active Problem List  Diagnosis  . DIABETES MELLITUS, TYPE II, ON INSULIN  . HYPERLIPIDEMIA  . MONOCLONAL GAMMOPATHY  . HYPERCALCEMIA  . ESSENTIAL HYPERTENSION  . COPD  . GERD  . Chronic kidney disease, stage IV (severe)  . TOBACCO ABUSE  . Lower extremity edema  . Obesity  . Depression  . Sinus congestion  . End stage renal disease  . Podagra  . Hyperuricemia  . Osteoporosis  . Diabetic retinopathy associated  with type 2 diabetes mellitus   Past Medical History: Past Medical History  Diagnosis Date  . Hyperlipidemia   . Hyperparathyroidism   . CAD (coronary artery disease)   . Myocardial infarction 2008  . Hypertension   . COPD (chronic obstructive pulmonary disease)   . Diabetes mellitus     diagnosed 2010 - takes oral meds and lantus insulin  . Anemia   . Chronic kidney disease     esrd - since 2010  . GERD (gastroesophageal reflux disease)     on omeprazole  . Arthritis     all over  . CHF (congestive heart failure) 08-2011  . CVA 02/19/2010  . CAD 02/19/2010  . DEEP VENOUS THROMBOPHLEBITIS, LEG, LEFT 03/08/2010    in 2009 s/p hospitalization- only 1 isolated dvt  . Stroke ?2010    found remote CVA on CT scan    Past Surgical History: Past Surgical History  Procedure Date  . Av fistula placement 04/05/11    Left Radiocephalic AVF  . Appendectomy 1969  . Tubal ligation   . Revision of fisturl   . Coronary angioplasty with stent placement 06/23/2007    by Dr. Eldridge Dace is her cardiologist  . Ligation goretex fistula 10/29/2011    Procedure: LIGATION GORE-TEX FISTULA;  Surgeon: Sherren Kerns, MD;  Location: [facility] OR;  Service: Vascular;  Laterality: Left;  Ligation of competing branches left forearm fistula    Social History: History   Social History  . Marital Status: Single    Spouse  Name: N/A    Number of Children: 3  . Years of Education: N/A   Occupational History  . RetiredInterior and spatial designer Work    Social History Main Topics  . Smoking status: Former Smoker -- 0.0 packs/day for 47 years    Types: Cigarettes    Quit date: 03/27/2011  . Smokeless tobacco: Never Used  . Alcohol Use: No  . Drug Use: No  . Sexually Active: None   Other Topics Concern  . None   Social History Narrative   Health Care POA: Marlyne Beards & Marylu Lund EllisonEmergency Contact: Marlyne Beards End of Life Plan: Who lives with you: Lives by her self in 1 story homeAny pets: Diet: Patient  typically eats frozen meal/sandwiches.  Has problems standing to cook.Exercise: Patient does not have a regular exercise plan.Seatbelts: Patient reports wearing seat belt when in vehicle.Sun Exposure/Protection: Patient does not wear sun protection.Hobbies: Crafts, knitting, crotcheting    Family History: Family History  Problem Relation Age of Onset  . Cancer Mother     multiple myloma  . Dementia Father   . Pneumonia Father   . Anuerysm Father     Allergies: No Known Allergies  Current Facility-Administered Medications  Medication Dose Route Frequency Provider Last Rate Last Dose  . [COMPLETED] albuterol (PROVENTIL) (5 MG/ML) 0.5% nebulizer solution 5 mg  5 mg Nebulization Once Donnetta Hutching, MD   5 mg at 10/30/12 2017  . furosemide (LASIX) injection 40 mg  40 mg Intravenous Once Carlyle Dolly, PA-C       Current Outpatient Prescriptions  Medication Sig Dispense Refill  . albuterol (PROVENTIL HFA) 108 (90 BASE) MCG/ACT inhaler Inhale 2 puffs into the lungs every 4 (four) hours as needed. For wheezing       . amLODipine (NORVASC) 10 MG tablet Take 1 tablet (10 mg total) by mouth every morning.  30 tablet  5  . aspirin 325 MG tablet Take 325 mg by mouth at bedtime.       . calcitRIOL (ROCALTROL) 0.25 MCG capsule Take 0.25 mcg by mouth daily.       . fexofenadine (ALLEGRA) 180 MG tablet Take 180 mg by mouth every morning.      . Fluticasone-Salmeterol (ADVAIR) 250-50 MCG/DOSE AEPB Inhale 1 puff into the lungs every morning.      . furosemide (LASIX) 40 MG tablet Take 40 mg by mouth every morning.       Marland Kitchen glipiZIDE (GLUCOTROL XL) 5 MG 24 hr tablet Take 5 mg by mouth daily.       Marland Kitchen glucosamine-chondroitin 500-400 MG tablet Take 1 tablet by mouth 2 (two) times daily.        . insulin glargine (LANTUS) 100 UNIT/ML injection Inject 13 Units into the skin every morning. If blood sugar is >140 add 1 unit. If blood sugar is <140 subtract 1 unit.      . metoprolol (LOPRESSOR) 25 MG tablet  Take 25 mg by mouth 2 (two) times daily.        . nitroGLYCERIN (NITROSTAT) 0.4 MG SL tablet Place 0.4 mg under the tongue every 5 (five) minutes as needed. For chest pain     Call 911 if pain not relieved by 3rd tab      . Omega-3 Fatty Acids (FISH OIL) 1200 MG CAPS Take 2 capsules by mouth 2 (two) times daily.      Marland Kitchen omeprazole (PRILOSEC) 20 MG capsule Take 1 capsule (20 mg total) by mouth at bedtime.  90 capsule  4  . Polyethyl Glycol-Propyl Glycol (SYSTANE OP) Apply 1-2 drops to eye 4 (four) times daily as needed. For dry eyes      . rosuvastatin (CRESTOR) 20 MG tablet Take 1 tablet (20 mg total) by mouth at bedtime.  30 tablet  6  . sertraline (ZOLOFT) 100 MG tablet Take 1 tablet by mouth daily.      . vitamin B-12 (CYANOCOBALAMIN) 1000 MCG tablet Take 1,000 mcg by mouth every morning.        Review Of Systems: Per HPI with the following additions: negative, see HPI. Otherwise 12 point review of systems was performed and was unremarkable.  Physical Exam: Pulse: 68  Blood Pressure: 143/55 RR: 24   O2: 89% RA Temp: 98.6  General: alert, cooperative, appears stated age, no distress and lying in bed, head elevated 20 degrees. HEENT: PERRLA, extra ocular movement intact and oropharynx clear, no lesions Heart: S1, S2 normal, no murmur, rub or gallop, regular rate and rhythm Lungs: unlabored breathing, rales on left to mid-lung field, right base. No wheezing, good air movement, no tachypnea at rest. Abdomen: abdomen is soft without significant tenderness, masses, organomegaly or guarding Extremities: edema trace to 1+ pitting L>R legs (chronic per pt). no redness or tenderness or cords noted. Skin:no rashes Neurology: normal without focal findings, mental status, speech normal, alert and oriented x3, PERLA, muscle tone and strength normal and symmetric and no tremors, cogwheeling or rigidity noted  Labs and Imaging:   Results for orders placed during the hospital encounter of 10/30/12 (from  the past 24 hour(s))  CBC     Status: Abnormal   Collection Time   10/30/12  8:56 PM      Component Value Range   WBC 8.2  4.0 - 10.5 K/uL   RBC 3.65 (*) 3.87 - 5.11 MIL/uL   Hemoglobin 10.0 (*) 12.0 - 15.0 g/dL   HCT 04.5 (*) 40.9 - 81.1 %   MCV 86.0  78.0 - 100.0 fL   MCH 27.4  26.0 - 34.0 pg   MCHC 31.8  30.0 - 36.0 g/dL   RDW 91.4 (*) 78.2 - 95.6 %   Platelets 170  150 - 400 K/uL  BASIC METABOLIC PANEL     Status: Abnormal   Collection Time   10/30/12  8:56 PM      Component Value Range   Sodium 142  135 - 145 mEq/L   Potassium 4.4  3.5 - 5.1 mEq/L   Chloride 106  96 - 112 mEq/L   CO2 21  19 - 32 mEq/L   Glucose, Bld 138 (*) 70 - 99 mg/dL   BUN 52 (*) 6 - 23 mg/dL   Creatinine, Ser 2.13 (*) 0.50 - 1.10 mg/dL   Calcium 9.8  8.4 - 08.6 mg/dL   GFR calc non Af Amer 17 (*) >90 mL/min   GFR calc Af Amer 20 (*) >90 mL/min  PRO B NATRIURETIC PEPTIDE     Status: Abnormal   Collection Time   10/30/12  8:57 PM      Component Value Range   Pro B Natriuretic peptide (BNP) 6615.0 (*) 0 - 125 pg/mL  POCT I-STAT TROPONIN I     Status: Normal   Collection Time   10/30/12  9:14 PM      Component Value Range   Troponin i, poc 0.02  0.00 - 0.08 ng/mL   Comment 3             CXR: Borderline  heart size with suggestion of mild pulmonary vascular congestion and early interstitial edema  EKG: sinus rhythm 65 per min, 1st deg AV block, TWI in V4-6  Assessment and Plan: Kristi [name] is a 71 y.o. year old female with history of Stage IV CKD, possible CHF, COPD, DM2 presenting from home with increasing exertional dyspnea and evidence of pulmonary edema concerning for acute CHF exacerbation.  1. Acute respiratory failure/exertional dyspnea. Borderline hypoxia with exertion. No symptoms at rest. Elevated BNP and CXR c/w pulmonary edema most likely CHF with some component of CKD contributing. No fever, WBC, wheezing or new leg pain to suggest infectious, COPD or VTE. -assess volume response  to 40 mg IV lasix administered just prior to my exam. -daily weight, strict ins/outs. -check echocardiogram. -repeat troponin in 6 hrs, no clinical signs acute ischemia. -O2 for goal sat >90% -continue home COPD medications (advair, albuterol prn)  2. CKD stage 4. Cr at baseline recently 2.3-2.8 in past year. Has AVF placed 2012. -monitor cr and UOP while diuresing.  -avoid nephrotoxins-NSAIDs, contrast dye -cont calcitriol.  3. HTN. Slight elevation in systolic. -continue home meds including metoprolol and norvasc. -f/u after diuresis  4. Anemia. Likely of chronic kidney disease. Hgb 10.0. Near recent values in clinic range 10.9-12. -continue to monitor, do not suspect contributing to current symptoms.   5. HLD/CAD. No chest pain currently. Nonspecific T wave changes on EKG  -Continue daily aspirin and statin. -cycle troponins -repeat EKG in am.  6. GERD. Continue PPI  7. DM2. Takes daily lantus 13 units, reports good control at home.  -continue daily lantus and CBGs qam.   8. FEN/GI: heart healthy diet. Repeat electrolytes after lasix. KVO.  Prophylaxis: heparin SQ for DVT ppx. PPI.  Disposition: to telemetry for observation and diuresis. Full code.   Lloyd Huger, MD [facility] Resident - PGY-3 10/31/2012 1:05 AM

## 2012-10-31 NOTE — H&P (Signed)
Family Medicine Teaching Service Attending Note  I interviewed and examined patient Kristi [name] and reviewed their tests and x-rays.  I discussed with Dr. Cristal Ford and reviewed their note for today.  I agree with their assessment and plan.     Additionally  Feels better this AM edema has lessened Sats drop to 85% on RA when stands Lungs - clear Extrem 1+ edema  Presentation most consistent with CHF exacerbation worsened due to CKD,  No signs of sudden cardiac condition or infection - continue diuresis, add TED hose, check echo

## 2012-11-01 DIAGNOSIS — I509 Heart failure, unspecified: Secondary | ICD-10-CM

## 2012-11-01 LAB — BASIC METABOLIC PANEL
CO2: 22 mEq/L (ref 19–32)
Chloride: 105 mEq/L (ref 96–112)
Creatinine, Ser: 2.74 mg/dL — ABNORMAL HIGH (ref 0.50–1.10)
Glucose, Bld: 77 mg/dL (ref 70–99)
Sodium: 141 mEq/L (ref 135–145)

## 2012-11-01 LAB — CBC
Hemoglobin: 9.5 g/dL — ABNORMAL LOW (ref 12.0–15.0)
MCV: 85.2 fL (ref 78.0–100.0)
Platelets: 155 10*3/uL (ref 150–400)
RBC: 3.45 MIL/uL — ABNORMAL LOW (ref 3.87–5.11)
WBC: 6.1 10*3/uL (ref 4.0–10.5)

## 2012-11-01 LAB — GLUCOSE, CAPILLARY: Glucose-Capillary: 83 mg/dL (ref 70–99)

## 2012-11-01 MED ORDER — METOPROLOL TARTRATE 25 MG PO TABS: 12.5000 mg | ORAL_TABLET | Freq: Two times a day (BID) | ORAL | Status: DC

## 2012-11-01 MED ORDER — FUROSEMIDE 40 MG PO TABS: 40.0000 mg | ORAL_TABLET | Freq: Two times a day (BID) | ORAL | Status: DC

## 2012-11-01 MED ORDER — FLUTICASONE-SALMETEROL 250-50 MCG/DOSE IN AEPB: 1.0000 | INHALATION_SPRAY | RESPIRATORY_TRACT | Status: DC

## 2012-11-01 NOTE — Progress Notes (Signed)
Called MD back in reference to giving meds - Metoprolol and Amiodarone  With low DBP of 40. Orders given to hold both medications. Will continue to monitor to end of shift.

## 2012-11-01 NOTE — Progress Notes (Signed)
Pt discharge instructions given. Pt verbalized understanding of instructions given. Pt condition is stable. Will have RN page teaching services for a Advair RX. Awaiting arrival of dgt for transportation home.

## 2012-11-01 NOTE — Progress Notes (Signed)
  Manuel B/P 120/40 HR 62 discuss with RN caring for patient. She will call MD. Will proceed with medication after consult with MD

## 2012-11-01 NOTE — Progress Notes (Addendum)
FMTS Daily Intern Progress Note  Subjective: Feels like she is breathing better this morning, with no fluid in her legs.  Ambulated and O2 saturation remained wnl off oxygen all morning.  I have reviewed the patient's medications.  Objective Temp:  [97.9 F (36.6 C)-98.6 F (37 C)] 98.1 F (36.7 C) (11/24 0830) Pulse Rate:  [59-82] 62  (11/24 1000) Resp:  [18] 18  (11/24 0830) BP: (99-121)/(40-55) 120/40 mmHg (11/24 1000) SpO2:  [91 %-98 %] 93 % (11/24 0830) Weight:  [194 lb (87.998 kg)-202 lb 6.4 oz (91.808 kg)] 202 lb 6.4 oz (91.808 kg) (11/24 0830)   Intake/Output Summary (Last 24 hours) at 11/01/12 1249 Last data filed at 11/01/12 1229  Gross per 24 hour  Intake   1300 ml  Output   1550 ml  Net   -250 ml    General: NAD, lying in bed HEENT: EOMI, sclera clear, MMM CV: RRR though distant, no murmurs, rubs, gallops Pulm: CTAB with no labored breathing, with soft crackles bilateral bases Abd: Soft, nondistended, NABS Ext: No bilateral LE edema, no cyanosis or clubbing Neuro: Alert and oriented, no focal deficits  Labs and Imaging  Lab 11/01/12 0640 10/31/12 0533 10/30/12 2056  WBC 6.1 6.9 8.2  HGB 9.5* 9.3* 10.0*  HCT 29.4* 29.4* 31.4*  PLT 155 150 170     Lab 11/01/12 0640 10/31/12 0533 10/30/12 2056  NA 141 143 142  K 3.8 3.9 4.4  CL 105 108 106  CO2 22 23 21   BUN 61* 58* 52*  CREATININE 2.74* 2.61* 2.61*  LABGLOM -- -- --  GLUCOSE 77 -- --  CALCIUM 9.3 9.7 9.8    Assessment and Plan Kristi [name] is a 71 y.o. year old female with history of Stage IV CKD, possible CHF, COPD, DM2 presenting from home with increasing exertional dyspnea and evidence of pulmonary edema concerning for acute CHF exacerbation.   1. CHF -  Acute respiratory failure/exertional dyspnea. Borderline hypoxia with exertion. No symptoms at rest. Elevated BNP and CXR c/w pulmonary edema most likely CHF with some component of CKD contributing. No fever, WBC, wheezing or new leg pain to  suggest infectious, COPD or VTE.  Troponin neg x 1 -Received 40 mg IV lasix yesterday, with adequate diuresis.  -daily weight, strict ins/outs - patient reports 6lb weight loss since admission -check echocardiogram - not done yet - should not hold up discharge.  -O2 for goal sat >90% - not currently needing  -continue home COPD medications (advair, albuterol prn)  - Discharge on lasix slightly increased from home dose  2. CKD stage 4. Cr at baseline recently 2.3-2.8 in past year. Has AVF placed 2012. It is 2.74 today. -monitor cr and UOP while diuresing.  -avoid nephrotoxins-NSAIDs, contrast dye  -cont calcitriol.   3. HTN. Slight elevation in systolic yesterday.  Today with SBP 121 but DBP in 40s - has been low this admission.  Pulse 59 today.  Asymptomatic. -Was continuing home meds including metoprolol and norvasc.  Today, holding both and will discharge on 1/2 dose of metoprolol and stop norvasc. - f/u with PCP  4. Anemia. Likely of chronic kidney disease. Hgb 10.0-->9.5. Near recent values in clinic range 10.9-12.  -continue to monitor, do not suspect contributing to current symptoms.   5. HLD/CAD. No chest pain currently. Nonspecific T wave changes on EKG, no signs of ischemia.  Troponin neg x 1 -Continue daily aspirin and statin. -repeat EKG was not done.   6. GERD.  Continue PPI   7. DM2. Takes daily lantus 13 units, reports good control at home.  -continue daily lantus and CBGs qam.   FEN/GI: heart healthy diet. KVO.  Prophylaxis: heparin SQ for DVT ppx. PPI.  Disposition: to telemetry for observation and diuresis. Full code. With improvement in respiratory status and good diuresis, likely discharge today with close PCP follow up. - Recommend discharging on slightly increased dose diuretic and recommendation to PCP to start spironolactone if patient has systolic dysfunction.  Simone Curia Pager: [phone] 11/01/2012, 12:49 PM

## 2012-11-01 NOTE — Progress Notes (Addendum)
Patient with DBP 40 on manual check, SBP 120 and patient is asymptomatic (no dizziness or chest pain).  Through hospital course, has had low diastolic BPs.  This may be baseline v compliant vessels.  Also slightly bradycardic to upper 50s today.  Continue to monitor BP and for symptoms.  Holding metoprolol and amiodarone this morning.  Kristi [name] 11/01/2012 10:41 AM

## 2012-11-01 NOTE — Discharge Summary (Signed)
Discharge Summary 11/01/2012 2:02 PM  Kristi [name] DOB: 1941-08-06 MRN: [id number]  Date of Admission: 10/30/2012 Date of Discharge: 11/01/2012  PCP: Mat Carne, MD Consultants: None  Reason for Admission: Difficulty breathing likely due to CHF  Discharge Diagnosis Primary 1. CHF exacerbation Secondary 1. CKD Stage 4 2. HTN 3. Anemia 4. HLD/CAD 5. GERD 6. DM Type II  Hospital Course:  Kristi [name] is a 70 y.o. year old female with history of Stage IV CKD, possible CHF, COPD, DM2 presenting from home with increasing exertional dyspnea and evidence of pulmonary edema concerning for acute CHF exacerbation.   1. CHF exacerbation - Unclear if this is the first presentation of this diagnosis, as 2011 Echo shows EF 60-65%.  Patient presented with acute respiratory failure/exertional dyspnea and borderline hypoxia with exertion, but no symptoms at rest. Elevated BNP and chest xray consistent with pulmonary edema most likely represented CHF with some component of CKD contributing. Troponin neg x 1.  She was given  O2 by [place] as needed and received IV lasix 40mg  x 1, with adequate diuresis and reported 6 lb weight loss by discharge.  Echocardiogram showed  EF 55-60%, mild mitral regurgitation, mildly dilated right ventricle, moderately dilated right atrium, and elevated right ventricular pressure consistent with severe pulmonary hypertension.  By discharge, patient did not need anymore O2 and was maintaining oxygen saturation on ambulation.  Discharged on lasix 40mg  PO BID (twice previous home dose) with close PCP follow up to follow symptoms, Cr, and electrolytes (K with increased lasix).  2. CKD stage 4 - Cr at baseline recently 2.3-2.8 in past year. Has AVF placed 2012. Creatinine on discharge slightly bumped to 2.74 likely due to lasix increased dosing.  Avoided nephrotoxins like NSAIDs and contrast dye.  Continued calcitriol. Check renal function on follow-up with increased dose  lasix.   3. HTN - Stable but with slight elevation in systolic 11/23. Today with SBP 121 but DBP in 40s - has been low this admission. Pulse was also low at 59, but patient asymptomatic.  Held metoprolol and norvasc and discharged on 1/2 dose metoprolol and discontinued norvasc until further advice from PCP at follow-up.  4. Anemia. Likely of chronic kidney disease. Hgb 10.0-->9.5. Near recent values in clinic range 10.9-12.   5. HLD/CAD. No chest pain this admission. Nonspecific T wave changes on EKG, no signs of ischemia. Troponin neg x 1. Continued aily aspirin and statin.   6. GERD - Stable, continued PPI   7. DM2. Stable.  Continued home daily lantus and CBGs qAM.  Discharge day physical exam: Filed Vitals:   11/01/12 1000  BP: 120/40  Pulse: 62  Temp:   Resp:   General: NAD, lying in bed  HEENT: EOMI, sclera clear, MMM  CV: RRR though distant, no murmurs, rubs, gallops  Pulm: CTAB with no labored breathing, with soft crackles bilateral bases  Abd: Soft, nondistended, NABS  Ext: No bilateral LE edema, no cyanosis or clubbing  Neuro: Alert and oriented, no focal deficits  Procedures: None  Discharge Medications   Medication List     As of 11/01/2012  2:02 PM    STOP taking these medications         amLODipine 10 MG tablet   Commonly known as: NORVASC      TAKE these medications         aspirin 325 MG tablet   Take 325 mg by mouth at bedtime.      calcitRIOL 0.25 MCG  capsule   Commonly known as: ROCALTROL   Take 0.25 mcg by mouth daily.      fexofenadine 180 MG tablet   Commonly known as: ALLEGRA   Take 180 mg by mouth every morning.      Fish Oil 1200 MG Caps   Take 2 capsules by mouth 2 (two) times daily.      Fluticasone-Salmeterol 250-50 MCG/DOSE Aepb   Commonly known as: ADVAIR   Inhale 1 puff into the lungs every morning.      furosemide 40 MG tablet   Commonly known as: LASIX   Take 1 tablet (40 mg total) by mouth 2 (two) times daily.       glipiZIDE 5 MG 24 hr tablet   Commonly known as: GLUCOTROL XL   Take 5 mg by mouth daily.      glucosamine-chondroitin 500-400 MG tablet   Take 1 tablet by mouth 2 (two) times daily.      insulin glargine 100 UNIT/ML injection   Commonly known as: LANTUS   Inject 13 Units into the skin every morning. If blood sugar is >140 add 1 unit. If blood sugar is <140 subtract 1 unit.      metoprolol tartrate 25 MG tablet   Commonly known as: LOPRESSOR   Take 0.5 tablets (12.5 mg total) by mouth 2 (two) times daily.      nitroGLYCERIN 0.4 MG SL tablet   Commonly known as: NITROSTAT   Place 0.4 mg under the tongue every 5 (five) minutes as needed. For chest pain     Call 911 if pain not relieved by 3rd tab      omeprazole 20 MG capsule   Commonly known as: PRILOSEC   Take 1 capsule (20 mg total) by mouth at bedtime.      PROVENTIL HFA 108 (90 BASE) MCG/ACT inhaler   Generic drug: albuterol   Inhale 2 puffs into the lungs every 4 (four) hours as needed. For wheezing      rosuvastatin 20 MG tablet   Commonly known as: CRESTOR   Take 1 tablet (20 mg total) by mouth at bedtime.      sertraline 100 MG tablet   Commonly known as: ZOLOFT   Take 1 tablet by mouth daily.      SYSTANE OP   Apply 1-2 drops to eye 4 (four) times daily as needed. For dry eyes      vitamin B-12 1000 MCG tablet   Commonly known as: CYANOCOBALAMIN   Take 1,000 mcg by mouth every morning.        Pertinent Hospital Labs ProBNP [id number]  2-view chest xray: IMPRESSION:  Borderline heart size with suggestion of mild pulmonary vascular  congestion and early interstitial edema  Echocardiogram - Study Conclusions - Left ventricle: The cavity size was normal. Systolic function was normal. The estimated ejection fraction was in the range of 55% to 60%. Wall motion was normal; there were no regional wall motion abnormalities. Left ventricular diastolic function parameters were normal. - Mitral valve: Mild  regurgitation. - Right ventricle: The cavity size was mildly dilated. - Right atrium: The atrium was moderately dilated. - Atrial septum: There was increased thickness of the septum, consistent with lipomatous hypertrophy. - Pulmonary arteries: PA peak pressure: 75mm Hg (S). Impressions: - The right ventricular systolic pressure was increased consistent with severe pulmonary hypertension.  Discharge CBC: WBC 6.1 Hgb 9.5 HCT 29.4 PLT 155   Discharge instructions: See after-visit summary  Condition at discharge: Stable  Disposition: Home  Pending Tests: Echocardiogram formal read  Follow up: Follow-up Information    Follow up with Kevin Fenton, MD. On 11/09/2012. (at 10:30 AM for a hospital follow-up appointment)    Contact information:   [address] [place] [address] [phone]          Follow up Issues:  - Follow-up formal echocardiogram results - In the future consider spironolactone if patient has systolic dysfunction - Follow up creatinine and K, as increased lasix to BID - Check symptoms of heart failure and respiratory status - Follow-up BP, as decreased BP medications due to borderline hypotension   Simone Curia 11/01/2012 2:02 PM PGY-1 Family Medicine Teaching Service Service pager [phone]

## 2012-11-01 NOTE — Progress Notes (Signed)
Kristi [name] to be D/C'd Home per MD order.  Discussed with the patient and all questions fully answered.   Neal, Oshea  Home Medication Instructions LKG:[id number]   Printed on:11/01/12 1443  Medication Information                    aspirin 325 MG tablet Take 325 mg by mouth at bedtime.            nitroGLYCERIN (NITROSTAT) 0.4 MG SL tablet Place 0.4 mg under the tongue every 5 (five) minutes as needed. For chest pain     Call 911 if pain not relieved by 3rd tab           albuterol (PROVENTIL HFA) 108 (90 BASE) MCG/ACT inhaler Inhale 2 puffs into the lungs every 4 (four) hours as needed. For wheezing            glucosamine-chondroitin 500-400 MG tablet Take 1 tablet by mouth 2 (two) times daily.             vitamin B-12 (CYANOCOBALAMIN) 1000 MCG tablet Take 1,000 mcg by mouth every morning.            Fluticasone-Salmeterol (ADVAIR) 250-50 MCG/DOSE AEPB Inhale 1 puff into the lungs every morning.           Omega-3 Fatty Acids (FISH OIL) 1200 MG CAPS Take 2 capsules by mouth 2 (two) times daily.           insulin glargine (LANTUS) 100 UNIT/ML injection Inject 13 Units into the skin every morning. If blood sugar is >140 add 1 unit. If blood sugar is <140 subtract 1 unit.           fexofenadine (ALLEGRA) 180 MG tablet Take 180 mg by mouth every morning.           omeprazole (PRILOSEC) 20 MG capsule Take 1 capsule (20 mg total) by mouth at bedtime.           calcitRIOL (ROCALTROL) 0.25 MCG capsule Take 0.25 mcg by mouth daily.            glipiZIDE (GLUCOTROL XL) 5 MG 24 hr tablet Take 5 mg by mouth daily.            rosuvastatin (CRESTOR) 20 MG tablet Take 1 tablet (20 mg total) by mouth at bedtime.           sertraline (ZOLOFT) 100 MG tablet Take 1 tablet by mouth daily.           Polyethyl Glycol-Propyl Glycol (SYSTANE OP) Apply 1-2 drops to eye 4 (four) times daily as needed. For dry eyes           furosemide (LASIX) 40 MG tablet Take 1 tablet (40 mg total)  by mouth 2 (two) times daily.           metoprolol tartrate (LOPRESSOR) 25 MG tablet Take 0.5 tablets (12.5 mg total) by mouth 2 (two) times daily.             VVS, Skin clean, dry and intact without evidence of skin break down, no evidence of skin tears noted. IV catheter discontinued intact. Site without signs and symptoms of complications. Dressing and pressure applied.  An After Visit Summary was printed and given to the patient. Patient escorted via WC, and D/C home via private auto.  Pat Sires 11/01/2012 2:43 PM

## 2012-11-01 NOTE — Progress Notes (Signed)
Notified MD of low diastolic pressure when taken manually-  120/40 Pt is asymptomatic. No new orders given. Will continue to monitor as needed.

## 2012-11-02 NOTE — Progress Notes (Signed)
Family Medicine Teaching Service Attending Note  On 11/24 I interviewed and examined patient Kristi [name] and reviewed their tests and x-rays.  I discussed with Dr. Benjamin Stain and reviewed their note for today.  I agree with their assessment and plan.     Additionally  Able to walk down hall well without O2 and no symptoms Stable for discharge Follow fluid status and K carefully with her chronic renal disease

## 2012-11-02 NOTE — Discharge Summary (Signed)
I have reviewed this discharge summary and agree.    

## 2012-11-03 ENCOUNTER — Inpatient Hospital Stay: Payer: Medicare Other | Admitting: Family Medicine

## 2012-11-09 ENCOUNTER — Inpatient Hospital Stay: Payer: Medicare Other | Admitting: Family Medicine

## 2012-11-10 ENCOUNTER — Ambulatory Visit (INDEPENDENT_AMBULATORY_CARE_PROVIDER_SITE_OTHER): Payer: Medicare Other | Admitting: Family Medicine

## 2012-11-10 ENCOUNTER — Encounter: Payer: Self-pay | Admitting: Family Medicine

## 2012-11-10 VITALS — BP 155/76 | HR 62 | Ht 60.0 in | Wt 189.2 lb

## 2012-11-10 DIAGNOSIS — I10 Essential (primary) hypertension: Secondary | ICD-10-CM

## 2012-11-10 DIAGNOSIS — N186 End stage renal disease: Secondary | ICD-10-CM

## 2012-11-10 DIAGNOSIS — I509 Heart failure, unspecified: Secondary | ICD-10-CM

## 2012-11-10 NOTE — Patient Instructions (Addendum)
Thanks for coming in today, it was nice to meet you!  Lets increase your metoprolol back to twice a day, and continue the lasix twice a day for now.   If your shortness of breath returns, if you have chest pain, or if you gain more than 3 lbs in a day seek medical attention.   Please come back for a follow up in 1 month.

## 2012-11-10 NOTE — Assessment & Plan Note (Addendum)
Improved from hospitalization Ef 55-60% and no diastolic dysf,  Crackles on dc resolved and dyspnea at baseline  Continue lasix 40 PO qd.  Seek help for SOB, chest pain, and weight gain of 3 lb in one day Increase metoprolol to 12.5 BID, from qd  Labs deferred today, collected at [facility] on 12/2, ROI filled out and labs requested.

## 2012-11-10 NOTE — Assessment & Plan Note (Addendum)
Stable Hypotension during recent hospitalization and Metoprolol changed to 12.5, and norvasc held 160/79, 155/76 today, 140 syst at home Her target is likely not as aggressive as she is 71 Asymptomatic  Increase to metoprolol 12.5 BID (from qd) Continue holding norvasc, restart on check up if still elevated Continue BID lasix F

## 2012-11-10 NOTE — Assessment & Plan Note (Signed)
Followed by Dr. Eliott Nine at [facility] and last seen on 12/2  Labs were collected then so a ROI form was filled out and lab results requested. BID lasix continued, no changes by Nephro yesterday, will consider changing to qd if significant decrease in renal function.

## 2012-11-10 NOTE — Progress Notes (Signed)
  Subjective:    Patient ID: Kristi [name], female    DOB: [birth year], 71 y.o.   MRN: [id number]  HPI Pt here for Hospital f/u for CHF exacerbation  SOB back to baseline, states she is having some with exertion that has been consistent for years. No chest pain.  Taking Lasix BID  HTN: elevated at home around 140 systolic. No HA, chest pain, Dyspnea except as above, and palpitations at baseline.  Taking metoprolol qd as instructed at d/c  States she has vertigo at baseline which is unchanged.   Saw Dr. Eliott Nine- nephro, yesterday who drew labs. No new changes per her.   Review of Systems No fvers, chills, sweats No chest pain + dyspnea as above- baseline No diarrhea, nausea, or vomiting No dysuria, + stress incontinence  No swelling + foot pain- transient on L side resolved with neosporin     Objective:   Physical Exam  Gen: NAD, alert, cooperative with exam HEENT: NCAT, EOMI, PERRL CV: RRR, good S1/S2 Resp: CTABL, no wheezes, non-labored, no crackles Abd: SNTND, BS present  Ext: No edema, 2+ DP pulses Bl, no lesions on feet BL Neuro: Alert and oriented, No gross deficits    Assessment & Plan:

## 2012-11-20 ENCOUNTER — Encounter: Payer: Self-pay | Admitting: Family Medicine

## 2012-12-07 ENCOUNTER — Other Ambulatory Visit: Payer: Self-pay | Admitting: Family Medicine

## 2012-12-07 DIAGNOSIS — R609 Edema, unspecified: Secondary | ICD-10-CM

## 2012-12-07 MED ORDER — FUROSEMIDE 40 MG PO TABS: 40.0000 mg | ORAL_TABLET | Freq: Every day | ORAL | Status: DC

## 2012-12-07 NOTE — Telephone Encounter (Signed)
Forward to PCP for refill

## 2012-12-07 NOTE — Telephone Encounter (Signed)
Rx sent to pharmacy   

## 2012-12-07 NOTE — Telephone Encounter (Signed)
Patient needs a new Rx to reflect the increase in the dosage of Furosemide.  The increase has caused her to finish her Rx early and she needs a new one in order for her insurance to pay for a new Rx. Her next appt with Dr. Clinton Sawyer is on 1/6 but she doesn't have enough of this medication to last until then.  [facility] uses [facility] on [address].

## 2012-12-13 ENCOUNTER — Other Ambulatory Visit: Payer: Self-pay | Admitting: Family Medicine

## 2012-12-14 ENCOUNTER — Ambulatory Visit (INDEPENDENT_AMBULATORY_CARE_PROVIDER_SITE_OTHER): Payer: Medicare Other | Admitting: Family Medicine

## 2012-12-14 ENCOUNTER — Encounter: Payer: Self-pay | Admitting: Family Medicine

## 2012-12-14 VITALS — BP 136/87 | HR 56 | Ht 60.0 in | Wt 194.0 lb

## 2012-12-14 DIAGNOSIS — I509 Heart failure, unspecified: Secondary | ICD-10-CM

## 2012-12-14 DIAGNOSIS — E119 Type 2 diabetes mellitus without complications: Secondary | ICD-10-CM

## 2012-12-14 NOTE — Progress Notes (Signed)
  Subjective:    Patient ID: Kristi [name], female    DOB: Apr 02, 1941, 72 y.o.   MRN: [id number]  HPI  1 F with stage IV CKD who presented to the hospital with fluid overload and shortness of breath on 10/30/12. She was diuresed and treated for a CHF exacerbation, but her echo revealed an EF of 55% and no diastolic dysfunction, so she must have CHF with preserved EF or is was a results of her renal dysfunction.   1. CHF, with preserved EF  - Patient denies SOB or peripheral edema - Feels like she has gained a few pounds, but it is the results of holiday food and not extra fluid - Currently taking Lasix 40 mg BID and metoprolol  2. Diabetes  Taking and tolerating: yes - Lantus 13U, glipizide 5 mg daily Fasting blood sugars:136-156  Hypoglycemic symptoms: no Diet Changes: eating lots of sweets Exercise: no Visual problems: no Last eye exam: Last eye exam less < 3 months ago Monitoring feet: yes Numbness/Tingling: no Diabetic Labs:  Lab Results  Component Value Date   HGBA1C 6.9 12/14/2012   HGBA1C 6.2 08/11/2012   HGBA1C 5.6 02/11/2012   Lab Results  Component Value Date   LDLCALC 117* 01/18/2011   CREATININE 2.74* 11/01/2012   Last microalbumin: No results found for this basename: MICROALBUR, [id number]   Taking an ACE-I ?: contraindicated 2/2 stage IV CKD   Review of Systems See HPI    Objective:   Physical Exam  Wt Readings from Last 5 Encounters:  12/14/12 194 lb (87.998 kg)  11/10/12 189 lb 3.2 oz (85.821 kg)  11/01/12 202 lb 6.4 oz (91.808 kg)  09/08/12 193 lb (87.544 kg)  08/26/12 188 lb (85.276 kg)   Gen: Elderly WF, obese, pleasant and conversant CV: distant heart sounds, 3cm JVD, no bruits Pulm: CTA-B Extremities: no edema of ankles Feet: no ulceration, negative monofilament test     Assessment & Plan:  72 year old F with well controlled DM, Type 2 and possible CHF with preserved EF.

## 2012-12-18 NOTE — Assessment & Plan Note (Signed)
Continue Lantus 13 U daily and glipizide 5mg  daily. F/u 3 months.

## 2012-12-18 NOTE — Assessment & Plan Note (Signed)
Patient needs to cont Lasix 40 mg daily. Cont metoprolol daily. No ACE-I since CKD IV.

## 2012-12-20 ENCOUNTER — Other Ambulatory Visit: Payer: Self-pay | Admitting: Family Medicine

## 2012-12-20 DIAGNOSIS — E119 Type 2 diabetes mellitus without complications: Secondary | ICD-10-CM

## 2013-02-14 ENCOUNTER — Other Ambulatory Visit: Payer: Self-pay | Admitting: Family Medicine

## 2013-02-23 ENCOUNTER — Other Ambulatory Visit: Payer: Medicare Other | Admitting: Lab

## 2013-02-23 ENCOUNTER — Ambulatory Visit: Payer: Medicare Other | Admitting: Oncology

## 2013-02-23 ENCOUNTER — Other Ambulatory Visit: Payer: Self-pay | Admitting: Family Medicine

## 2013-02-25 ENCOUNTER — Other Ambulatory Visit: Payer: Self-pay | Admitting: Family Medicine

## 2013-02-26 ENCOUNTER — Other Ambulatory Visit: Payer: Self-pay | Admitting: Family Medicine

## 2013-03-01 ENCOUNTER — Telehealth: Payer: Self-pay | Admitting: Family Medicine

## 2013-03-01 NOTE — Telephone Encounter (Signed)
Called patient to discuss that she did not need to take Norvasc at this point. It was discontinued during hospitalization in Nov. She reports her home BP as ranging 120-130/60-70. Also, I don't want any peripheral edema caused by Norvasc to possibly interfere with our assessment of her fluid status/CHF.

## 2013-03-06 ENCOUNTER — Other Ambulatory Visit: Payer: Self-pay | Admitting: Family Medicine

## 2013-03-15 ENCOUNTER — Other Ambulatory Visit: Payer: Self-pay | Admitting: Family Medicine

## 2013-04-05 LAB — LIPID PANEL
Calcium, serum, ionized: 10.7
Cholesterol, Total: 164
Iron: 72
PTH Interp: 30
Triglycerides: 39

## 2013-04-07 ENCOUNTER — Other Ambulatory Visit: Payer: Self-pay | Admitting: Family Medicine

## 2013-04-07 ENCOUNTER — Encounter: Payer: Self-pay | Admitting: Family Medicine

## 2013-04-07 DIAGNOSIS — N184 Chronic kidney disease, stage 4 (severe): Secondary | ICD-10-CM

## 2013-04-07 MED ORDER — CALCITRIOL 0.25 MCG PO CAPS: ORAL_CAPSULE | ORAL | Status: DC

## 2013-04-08 ENCOUNTER — Encounter: Payer: Self-pay | Admitting: Family Medicine

## 2013-04-08 ENCOUNTER — Ambulatory Visit (INDEPENDENT_AMBULATORY_CARE_PROVIDER_SITE_OTHER): Payer: Medicare Other | Admitting: Family Medicine

## 2013-04-08 VITALS — BP 145/81 | HR 59 | Ht 60.25 in | Wt 190.0 lb

## 2013-04-08 DIAGNOSIS — E1139 Type 2 diabetes mellitus with other diabetic ophthalmic complication: Secondary | ICD-10-CM

## 2013-04-08 DIAGNOSIS — E119 Type 2 diabetes mellitus without complications: Secondary | ICD-10-CM

## 2013-04-08 DIAGNOSIS — I10 Essential (primary) hypertension: Secondary | ICD-10-CM

## 2013-04-08 DIAGNOSIS — E11319 Type 2 diabetes mellitus with unspecified diabetic retinopathy without macular edema: Secondary | ICD-10-CM

## 2013-04-08 NOTE — Assessment & Plan Note (Addendum)
Continue Amlodipine 10 mg , Furosemide 40 mg once daily and Metoprolol 25 mg BID.

## 2013-04-08 NOTE — Patient Instructions (Signed)
Dear [name],   I am very glad that your diabetes is well controlled. Please continue your current regimen. Also, please take the amlodipine, furosemide and metoprolol for blood pressure like Dr. Eliott Nine prescribed.   Please follow up in 3 months.  Dr. Clinton Sawyer

## 2013-04-08 NOTE — Assessment & Plan Note (Signed)
Continue Lantus 13 U daily and glipizide 5mg  daily. F/u 3 months.

## 2013-04-08 NOTE — Progress Notes (Signed)
  Subjective:    Patient ID: Kristi [name], female    DOB: 08-16-1941, 72 y.o.   MRN: [id number]  HPI  Hypertension - Also being jointly managed by her nephrology, Dr. Eliott Nine.  Home BP monitoring:  BP Readings from Last 3 Encounters:  04/08/13 145/81  12/14/12 136/87  11/10/12 155/76    Prescribed meds:  Amlodipine 10  - recently restarted by nephrologist Dr. Eliott Nine  Metoprolol 25 mg BID Furosemide 40 mg daily   Hypertension ROS: taking medications as instructed, no medication side effects noted, no chest pain on exertion, no dyspnea on exertion and no swelling of ankles  Diabetes  Taking and tolerating: yes Lantus minimum sliding scale  Fasting blood sugars: 7 day average 116, 14 day average 129 Hypoglycemic symptoms: no Diet Changes: none Exercise: no Visual problems: no Last eye exam: 03/01/13 Monitoring feet: yes Numbness/Tingling: no Diabetic Labs:  Lab Results  Component Value Date   HGBA1C 6.9 04/08/2013   HGBA1C 6.9 12/14/2012   HGBA1C 6.2 08/11/2012   Lab Results  Component Value Date   LDLCALC 98 04/05/2013   CREATININE 2.74* 11/01/2012   Last microalbumin: No results found for this basename: MICROALBUR,  [id number]   Taking an ACE-I ?: cannot, stage IV CKD   Vertigo - Intermittent, occurs when going from lying to standing, associated with congestion of the right ear  Review of Systems See HPI    Objective:   Physical Exam  BP 145/81  Pulse 59  Ht 5' 0.25" (1.53 m)  Wt 190 lb (86.183 kg)  BMI 36.82 kg/m2 Gen: obese, elderly WF, non distressed, pleasant and conversant CV: RRR, no murmurs rubs or gallops Pulm: CTA-B, normal work of breathing LEgs: no edema       Assessment & Plan:

## 2013-04-29 ENCOUNTER — Other Ambulatory Visit: Payer: Self-pay | Admitting: Family Medicine

## 2013-05-09 ENCOUNTER — Other Ambulatory Visit: Payer: Self-pay | Admitting: Family Medicine

## 2013-05-30 ENCOUNTER — Other Ambulatory Visit: Payer: Self-pay | Admitting: Family Medicine

## 2013-05-31 ENCOUNTER — Telehealth: Payer: Self-pay | Admitting: Family Medicine

## 2013-05-31 ENCOUNTER — Other Ambulatory Visit: Payer: Self-pay | Admitting: Family Medicine

## 2013-05-31 NOTE — Telephone Encounter (Signed)
Patient not taking Sertaline for depression.

## 2013-06-17 ENCOUNTER — Other Ambulatory Visit: Payer: Self-pay

## 2013-07-19 ENCOUNTER — Ambulatory Visit (INDEPENDENT_AMBULATORY_CARE_PROVIDER_SITE_OTHER): Payer: Medicare Other | Admitting: Family Medicine

## 2013-07-19 ENCOUNTER — Encounter: Payer: Self-pay | Admitting: Family Medicine

## 2013-07-19 VITALS — BP 125/72 | HR 55 | Temp 98.1°F | Ht 60.0 in | Wt 196.0 lb

## 2013-07-19 DIAGNOSIS — G8929 Other chronic pain: Secondary | ICD-10-CM

## 2013-07-19 DIAGNOSIS — G4700 Insomnia, unspecified: Secondary | ICD-10-CM

## 2013-07-19 DIAGNOSIS — E119 Type 2 diabetes mellitus without complications: Secondary | ICD-10-CM

## 2013-07-19 DIAGNOSIS — G478 Other sleep disorders: Secondary | ICD-10-CM

## 2013-07-19 DIAGNOSIS — M549 Dorsalgia, unspecified: Secondary | ICD-10-CM

## 2013-07-19 DIAGNOSIS — G479 Sleep disorder, unspecified: Secondary | ICD-10-CM

## 2013-07-19 MED ORDER — TRAZODONE 25 MG HALF TABLET: 25.0000 mg | ORAL_TABLET | Freq: Every day | ORAL | Status: DC

## 2013-07-19 NOTE — Progress Notes (Signed)
Patient ID: Kristi [name], female   DOB: July 15, 1941, 72 y.o.   MRN: [id number]   Subjective:   Diabetes:  Recent Issues: none, still on Lantus and glipizide  Medication Compliance:  Diabetes medication: Yes  Taking ACE-I: No - has stage IV CKD, cared for by Dr. 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 no trauma Alleviating Factors: injection from [facility] Ethelene Hal at [facility] Exacerbating Factors: movement Hx of intervention: no surgery, no PT Hx of imaging: yes - MRI, but records with orthopedic office Red Flags: no weakness, no numbness and tingling, no impaired bowel or bladder function      Sleeping Difficulty: - Several months duration, no trouble falling asleep but awakes after approximately 2 hours after sleeping and will remain awake for several hours - Denies caffeine use, goes to sleep at 11PM daily, only naps on days after poor sleeping the previous night - patient bothered to level of 6/10 and willing to try medication   Review of Systems:  Pertinent items are noted in HPI.     Objective:   Physical Exam: BP 125/72  Pulse 55  Temp(Src) 98.1 F (36.7 C) (Oral)  Ht 5' (1.524 m)  Wt 196 lb (88.905 kg)  BMI 38.28 kg/m2  General: alert, well appearing, and in no distress, obese Eyes: deferred Cardiovascular: RRR, nl S1 and S2 Feet: warm, good capillary refill and 2+ edema of dorsum of right foot Back: tenderness along PSIC, 5/5 strength LE bilaterally, 2+ reflexes   Labs:   Diabetic Labs:   Lab Results  Component Value Date   HGBA1C 6.6 07/19/2013   HGBA1C 6.9 04/08/2013   HGBA1C 6.9 12/14/2012   Lab Results  Component Value Date   LDLCALC 98 04/05/2013   CREATININE 2.74* 11/01/2012   Last microalbumin: No results found for this basename: MICROALBUR, [id number]        Assessment & Plan:

## 2013-07-19 NOTE — Patient Instructions (Addendum)
Dear [name],   Diabetes - I think that you are doing a great job of controlling your blood sugar. Continue your current medications.   Back Pain - I would like to get records from [facility]. Also, you have to beat the back pain by exercising. It will help prolong your life to have daily exercise. Rising from a chair to standing position - do 3 sets of 10 and do that 3 times a day. Walking as tolerated and water aerobics are also advised.   Sleep Problems - You seem to have good sleep hygiene. We can try a medication called trazodone 25 mg as needed for sleep. Also, try to avoid caffeine and exciting movies/television at night.   Please come back in 1 month to check on your sleep.   Sincerely,   Dr. Clinton Sawyer

## 2013-07-21 DIAGNOSIS — G8929 Other chronic pain: Secondary | ICD-10-CM | POA: Insufficient documentation

## 2013-07-21 DIAGNOSIS — M549 Dorsalgia, unspecified: Secondary | ICD-10-CM | POA: Insufficient documentation

## 2013-07-21 DIAGNOSIS — G479 Sleep disorder, unspecified: Secondary | ICD-10-CM | POA: Insufficient documentation

## 2013-07-21 NOTE — Assessment & Plan Note (Signed)
Continue Lantus 13 U daily and glipizide 5mg  daily. F/u 3 months.

## 2013-07-21 NOTE — Assessment & Plan Note (Signed)
Patient informed that best treatment for back pain is exercise. She is aware that she does not exercise. She was given instructions on home exercise and will consider starting water aerobics.

## 2013-10-14 ENCOUNTER — Other Ambulatory Visit: Payer: Self-pay

## 2013-10-28 ENCOUNTER — Telehealth: Payer: Self-pay | Admitting: Interventional Cardiology

## 2013-10-28 ENCOUNTER — Ambulatory Visit (INDEPENDENT_AMBULATORY_CARE_PROVIDER_SITE_OTHER): Payer: Medicare Other | Admitting: Family Medicine

## 2013-10-28 ENCOUNTER — Encounter: Payer: Self-pay | Admitting: Family Medicine

## 2013-10-28 ENCOUNTER — Telehealth: Payer: Self-pay | Admitting: Cardiology

## 2013-10-28 VITALS — BP 150/82 | HR 80 | Ht 60.0 in | Wt 196.8 lb

## 2013-10-28 DIAGNOSIS — G479 Sleep disorder, unspecified: Secondary | ICD-10-CM

## 2013-10-28 DIAGNOSIS — E119 Type 2 diabetes mellitus without complications: Secondary | ICD-10-CM

## 2013-10-28 DIAGNOSIS — K0889 Other specified disorders of teeth and supporting structures: Secondary | ICD-10-CM

## 2013-10-28 DIAGNOSIS — K089 Disorder of teeth and supporting structures, unspecified: Secondary | ICD-10-CM

## 2013-10-28 DIAGNOSIS — G478 Other sleep disorders: Secondary | ICD-10-CM

## 2013-10-28 LAB — POCT GLYCOSYLATED HEMOGLOBIN (HGB A1C): Hemoglobin A1C: 6.3

## 2013-10-28 NOTE — Telephone Encounter (Signed)
No need for SBE prophylaxis 

## 2013-10-28 NOTE — Telephone Encounter (Signed)
Faxed to Dr. Laural Benes at [phone]. Call back number is [phone].

## 2013-10-28 NOTE — Telephone Encounter (Signed)
Kristi [name] called back from Dr. Henriette Combs office and they need a note from Dr. Eldridge Dace stating whether pt needs SBE or not.

## 2013-10-28 NOTE — Telephone Encounter (Signed)
Kristi [name] from Dr. Henriette Combs office called stating pt is in there office about to have a tooth pulled. They wanted to make sure pt doesn't need SBE prophylaxis. 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 I told Shanda Bumps from looking at her chart pt doesn't need SBE prophylaxis. They needed an immediate answer as pt was in there chair. No significant valve problems on echo done 1 year ago. To Dr. Eldridge Dace to timestamp.

## 2013-10-28 NOTE — Progress Notes (Signed)
Patient ID: Kristi [name], female   DOB: 1941-04-11, 72 y.o.   MRN: [id number]   Subjective:   72 year old F with CAD, CHFpEF, DM2, stage IV CKD, and HTN who presents for follow up.   Severe Tooth Pain:  Right upper and lower jaw, started last week and planning to have teeth pulled this morning but dentist needed note from cardiologist prior to this. She has not been able to contact Dr. Eldridge Dace. She notes numerous cavities. She denies fever, chills, or swelling of gums or cheek. She has not been taking anything for pain. She has oxycodone 5 mg at home, but has not taken any due to concerns about developing a dependency. Ms. Marten has no history of alcohol or drug dependency.   Sleep Disorder:  Patient with improved sleeping while taking Trazodone PRN, only needed it 5-6 times in 3 months   Diabetes  Recent Issues:  Hypoglycemia: No   Medication Compliance: Diabetes medication: Yes Lantus 10-14 U and glipizide 5 mg daily Taking ACE-I: No - no, stage IV CKD (recently saw nephrologist, Dr. Eliott Nine at [facility], who took her off calcitriol but did not make any other changes to blood pressure medications) Taking statin: Yes  Behavioral: Home CBG Monitoring: Yes 80-140 Diet changes: Yes - decreased appetite and PO intake due to tooth pain Exercise: No  Health Maintenance: Visual problems: No Last eye exam:March 2014 Last dental visit:  Today Foot ulcers: No      Current Outpatient Prescriptions on File Prior to Visit  Medication Sig Dispense Refill  . albuterol (PROVENTIL HFA) 108 (90 BASE) MCG/ACT inhaler Inhale 2 puffs into the lungs every 4 (four) hours as needed. For wheezing       . amLODipine (NORVASC) 10 MG tablet Take 10 mg by mouth daily.      Marland Kitchen aspirin 325 MG tablet Take 325 mg by mouth at bedtime.       . calcitRIOL (ROCALTROL) 0.25 MCG capsule Twice a week.  60 capsule  0  . CRESTOR 20 MG tablet TAKE 1 TABLET BY MOUTH AT BEDTIME  30 tablet  6  . [facility] ALLERGY  RELIEF 180 MG tablet TAKE 1 TABLET BY MOUTH EVERY DAY  90 tablet  3  . Fluticasone-Salmeterol (ADVAIR) 250-50 MCG/DOSE AEPB Inhale 1 puff into the lungs every morning.  60 each  0  . furosemide (LASIX) 40 MG tablet TAKE 1 TABLET BY MOUTH EVERY DAY  60 tablet  6  . glipiZIDE (GLUCOTROL XL) 5 MG 24 hr tablet TAKE 1 TABLET BY MOUTH AT BEDTIME  30 tablet  11  . glucosamine-chondroitin 500-400 MG tablet Take 1 tablet by mouth 2 (two) times daily.        . insulin glargine (LANTUS) 100 UNIT/ML injection Inject 13 Units into the skin every morning. If blood sugar is >140 add 1 unit. If blood sugar is <140 subtract 1 unit.      . Insulin Pen Needle (B-D ULTRAFINE III SHORT PEN) 31G X 8 MM MISC USE AS DIRECTED 2 TIMES A DAY  100 each  1  . LANTUS SOLOSTAR 100 UNIT/ML injection INJECT 13 UNITS EVERY MORNING AS DIRECTED  15 pen  2  . metoprolol tartrate (LOPRESSOR) 25 MG tablet Take 0.5 tablets (12.5 mg total) by mouth 2 (two) times daily.  30 tablet  0  . nitroGLYCERIN (NITROSTAT) 0.4 MG SL tablet Place 0.4 mg under the tongue every 5 (five) minutes as needed. For chest pain  Call 911 if pain not relieved by 3rd tab      . Omega-3 Fatty Acids (FISH OIL) 1200 MG CAPS Take 2 capsules by mouth 2 (two) times daily.      Marland Kitchen omeprazole (PRILOSEC) 20 MG capsule TAKE ONE CAPSULE BY MOUTH AT BEDTIME  90 capsule  3  . ONE TOUCH ULTRA TEST test strip CHECK BLOOD SUGAR 4 TIMES DAILY.  100 each  3  . Polyethyl Glycol-Propyl Glycol (SYSTANE OP) Apply 1-2 drops to eye 4 (four) times daily as needed. For dry eyes      . traZODone (DESYREL) 25 mg TABS tablet Take 0.5 tablets (25 mg total) by mouth at bedtime.  30 tablet  0  . vitamin B-12 (CYANOCOBALAMIN) 1000 MCG tablet Take 1,000 mcg by mouth every morning.        No current facility-administered medications on file prior to visit.     Review of Systems:  Pertinent items are noted in HPI.     Objective:   Physical Exam: BP 150/82  Pulse 80  Ht 5' (1.524 m)   Wt 196 lb 12.8 oz (89.268 kg)  BMI 38.43 kg/m2  General: alert, well appearing, and very uncomfortable  Eyes: deferred Mouth: no swellling or redness of gums or cheeks, marked tenderness to palpation of upper and lower molars on right side Cardiovascular: RRR, 2/6 rumbling systolic murmur at LLSB and ULSB Pulmonary: clear to auscultation bilaterally Feet: warm, good capillary refill  Labs:   Diabetic Labs:  Lab Results  Component Value Date   HGBA1C 6.6 07/19/2013   HGBA1C 6.9 04/08/2013   HGBA1C 6.9 12/14/2012   Lab Results  Component Value Date   LDLCALC 98 04/05/2013   CREATININE 2.74* 11/01/2012   Last microalbumin: No results found for this basename: MICROALBUR,  [id number]        Assessment & Plan:

## 2013-10-28 NOTE — Telephone Encounter (Signed)
New Problem   pt needs a tooth extracted// request medical clearance// Her appt is today @ 11am  Please send the clearance to Dr. Franky Macho @ [phone]

## 2013-10-28 NOTE — Telephone Encounter (Signed)
Clearance has already been faxed.

## 2013-10-28 NOTE — Patient Instructions (Signed)
Kristi [name],   It was great to see you. I am sorry about the tooth pain. Please read below about the things we discussed.   1. Tooth Pain - take the oxycodone as needed, but do not drive afterwards  2. Diabetes - it is well controlled with an A1C of 6.3 (goal is less 7.0), continue glipizide and Lantus  3. Sleep - I am glad that you are sleeping better and take the trazodone as needed  4. Fluid retention - I would take 2 pills for fluid if it gets worse  Please come back in 3 months for your next check.   Sincerely,   Dr. Clinton Sawyer

## 2013-10-29 ENCOUNTER — Telehealth: Payer: Self-pay | Admitting: Family Medicine

## 2013-10-29 DIAGNOSIS — K0530 Chronic periodontitis, unspecified: Secondary | ICD-10-CM

## 2013-10-29 MED ORDER — DOXYCYCLINE HYCLATE 100 MG PO TABS
200.0000 mg | ORAL_TABLET | Freq: Two times a day (BID) | ORAL | Status: DC
Start: 2013-10-29 — End: 2014-03-24

## 2013-10-29 NOTE — Telephone Encounter (Signed)
New Problem:   Pt's daughter states the pt went to the dentist yesterday and she needs 2 teeth removed. Pt's daughter states the dentist office is waiting on the approval from the pt's cardiologist for antibiotics and approval for the tooth extraction. The name of the dentist and fax number is below... Dr. Stark Falls [phone]

## 2013-10-29 NOTE — Telephone Encounter (Signed)
Pt instructed to take oxycodone PRN and will send in prescription for antibiotic. Pt agreeable to this plan.

## 2013-10-29 NOTE — Telephone Encounter (Signed)
Daughter called because her mother was seen yesterday and was waiting for approval from her heart doctor to get a dentist appointment. The dentist office is closed today. Dr. Clinton Sawyer said that he could prescribe some antibiotics and pain medication until she gets into see the dentist. Could we send the medication to the pharmacy today since she will not be seen until Monday or Tuesday. JW

## 2013-10-29 NOTE — Telephone Encounter (Signed)
Forward to PCP for Rx 

## 2013-10-29 NOTE — Telephone Encounter (Signed)
Refaxed info to Dr. Laural Benes.

## 2013-10-31 DIAGNOSIS — K0889 Other specified disorders of teeth and supporting structures: Secondary | ICD-10-CM | POA: Insufficient documentation

## 2013-10-31 NOTE — Assessment & Plan Note (Signed)
A: well controlled P: cont current regimen of glipizide and insulin

## 2013-10-31 NOTE — Assessment & Plan Note (Signed)
A: improved s/p use of trazodone 25 mg for 4 nights to improve cycle P: cont trazodone 25 mg PRN

## 2013-10-31 NOTE — Assessment & Plan Note (Signed)
A: caries in upper and lower molars bilaterally without evidence of gingivitis  P: encouraged to use oxycodone 5 mg q 6-8 hrs PRN and f/u with dentist ASAP

## 2013-11-23 ENCOUNTER — Telehealth: Payer: Self-pay | Admitting: Family Medicine

## 2013-11-23 NOTE — Telephone Encounter (Signed)
Would like to call in RX for oxy-codone. She says she discussed it with him at her last visit [facility] [address]

## 2013-11-23 NOTE — Telephone Encounter (Signed)
Will fwd to MD.  Pt will have to pick up Rx from our office.  Anureet Bruington, Darlyne Russian, CMA

## 2013-11-23 NOTE — Telephone Encounter (Signed)
Pt calling about chronic lower back pain and going out of town for Christmas. She would like to be prescribed pain medication, because she cannot see Dr. Ethelene Hal until January. I explained that the Oxycodone that I said I would prescribe was only for tooth pain. Since this is a separate issue, she needs to be seen for a visit. She was agreeable with this plan.

## 2013-12-13 ENCOUNTER — Other Ambulatory Visit: Payer: Self-pay | Admitting: Emergency Medicine

## 2013-12-13 MED ORDER — ROSUVASTATIN CALCIUM 20 MG PO TABS: ORAL_TABLET | ORAL | Status: DC

## 2013-12-14 ENCOUNTER — Other Ambulatory Visit: Payer: Self-pay | Admitting: Family Medicine

## 2014-01-14 ENCOUNTER — Other Ambulatory Visit: Payer: Self-pay | Admitting: *Deleted

## 2014-01-14 DIAGNOSIS — E119 Type 2 diabetes mellitus without complications: Secondary | ICD-10-CM

## 2014-01-14 MED ORDER — GLUCOSE BLOOD VI STRP: ORAL_STRIP | Status: DC

## 2014-02-02 ENCOUNTER — Other Ambulatory Visit: Payer: Self-pay | Admitting: Family Medicine

## 2014-02-10 ENCOUNTER — Ambulatory Visit: Payer: Medicare Other | Admitting: Family Medicine

## 2014-02-14 ENCOUNTER — Encounter: Payer: Self-pay | Admitting: Interventional Cardiology

## 2014-02-16 ENCOUNTER — Ambulatory Visit (INDEPENDENT_AMBULATORY_CARE_PROVIDER_SITE_OTHER): Payer: Medicare HMO | Admitting: Family Medicine

## 2014-02-16 ENCOUNTER — Encounter: Payer: Self-pay | Admitting: Family Medicine

## 2014-02-16 VITALS — BP 129/69 | HR 53 | Temp 98.1°F | Ht 60.25 in | Wt 192.1 lb

## 2014-02-16 DIAGNOSIS — M25569 Pain in unspecified knee: Secondary | ICD-10-CM

## 2014-02-16 DIAGNOSIS — M25561 Pain in right knee: Secondary | ICD-10-CM

## 2014-02-16 DIAGNOSIS — E119 Type 2 diabetes mellitus without complications: Secondary | ICD-10-CM

## 2014-02-16 DIAGNOSIS — J449 Chronic obstructive pulmonary disease, unspecified: Secondary | ICD-10-CM

## 2014-02-16 DIAGNOSIS — I509 Heart failure, unspecified: Secondary | ICD-10-CM

## 2014-02-16 LAB — IRON: Iron: 66 ug/dL (ref 42–145)

## 2014-02-16 LAB — POCT GLYCOSYLATED HEMOGLOBIN (HGB A1C): HEMOGLOBIN A1C: 6.4

## 2014-02-16 LAB — FERRITIN: Ferritin: 75 ng/mL (ref 10–291)

## 2014-02-16 MED ORDER — HYDROCODONE-ACETAMINOPHEN 2.5-325 MG PO TABS: 1.0000 | ORAL_TABLET | Freq: Four times a day (QID) | ORAL | Status: DC | PRN

## 2014-02-16 MED ORDER — HYDROCODONE-ACETAMINOPHEN 5-325 MG PO TABS: 1.0000 | ORAL_TABLET | Freq: Four times a day (QID) | ORAL | Status: DC | PRN

## 2014-02-16 MED ORDER — ALBUTEROL SULFATE HFA 108 (90 BASE) MCG/ACT IN AERS: 2.0000 | INHALATION_SPRAY | RESPIRATORY_TRACT | Status: DC | PRN

## 2014-02-16 NOTE — Addendum Note (Signed)
Addended by: Angelica Ran on: 02/16/2014 03:49 PM   Modules accepted: Orders, Medications

## 2014-02-16 NOTE — Patient Instructions (Signed)
Ms. Toula, [place] to see you. Please read below.   1. Diabetes - Stop the glipizide.   2. Knee Pain - Use hydrocodone as needed.   3. Breathing - Use the albuterol as needed. If you ever need it more than 1x per week, then let me know.   Come back in 1 month.  Sincerely,   Dr. Maricela Bo

## 2014-02-16 NOTE — Progress Notes (Signed)
Patient ID: Kristi [name], female   DOB: 11-11-1941, 73 y.o.   MRN: [id number]   Subjective:   73 year old F with DM type 2 and stage IV CKD. She is also complaining of shortness of breath and joint pain.   Diabetes  Recent Issues:  Hypoglycemia: Yes - AM sugar 60 x 2 in the last 2 weeks  Medication Compliance: Diabetes medication: Yes - lantus 13 units q AM and glipizide XL 5 mg q PM Taking ACE-I:no, stage IV CKD (recently saw nephrologist, Dr. Lorrene Reid at [facility], who took her off calcitriol but did not make any other changes to blood pressure medications) Taking statin: Yes  Behavioral: Home CBG Monitoring: Yes - 126 this morning Diet changes: No Exercise: No  Health Maintenance: Visual problems: Yes - pt with bilateral non proliferative retinopathy  Last eye exam:12/23/13 Last dental visit: 1 month ago, had decayed teeth pulled  Foot ulcers: No   Shortness of Breath The patient denies any shortness of breath at this time. However, she has had 2 times in the last month where she felt short of breath like she needed her inhaler. Specifically her albuterol inhaler. However she did not have any medication left inhaler. The patient notes that she has a history of COPD, but does not know how or when she was diagnosed with COPD. She has previously been prescribed Advair and albuterol. She does not take Advair and has not taken it for approximately one year.   MSK Pain Patient complaining of severe pain in right knee, ankles bilaterally, shoulders bilaterally and lower back. She has a history of arthritis and is receiving injections of her sacroiliac joint at [place]. Most recently she had this procedure on February 20. Today the patient notes that her pain is the worst in her right knee. It is swollen and tender. She is taking Tylenol and oxycodone. However the oxycodone makes her fatigued and she would like to take something less sedative. She has had no recent falls  or trauma. She has had no recent imaging. She denies a history of injections of this knee.    Review of Systems:  Pertinent items are noted in HPI.     Objective:   Physical Exam: BP 129/69  Pulse 53  Temp(Src) 98.1 F (36.7 C) (Oral)  Ht 5' 0.25" (1.53 m)  Wt 192 lb 1.6 oz (87.136 kg)  BMI 37.22 kg/m2  General: elderly female, appears older than stated age, chronically healed, mild distress Cardiovascular: RRR, nl S1 and S2, systolic murmur Pulmonary: clear to auscultation bilaterally and normal work of breathing Feet: warm, good capillary refill, normal DP and PT pulses, normal sensory exam and 2+ pitting edema on the dorsum of her feet bilaterally Musculoskeletal: generalized swelling without erythema or warmth of the right knee with marked medial joint line tenderness and decreased active flexion and extension  Labs:   Diabetic Labs:  Lab Results  Component Value Date   HGBA1C 6.3 10/28/2013   HGBA1C 6.6 07/19/2013   HGBA1C 6.9 04/08/2013   Lab Results  Component Value Date   LDLCALC 98 04/05/2013   CREATININE 2.74* 11/01/2012   Last microalbumin: No results found for this basename: MICROALBUR, [id number]        Assessment & Plan:

## 2014-02-16 NOTE — Assessment & Plan Note (Signed)
Assessment: The patient has had 2 recent episodes of hypoglycemia, which means that we are being too strict with her blood sugar control and at all costs would like to avoid hypoglycemic events.  Plan: stop glipizide, continue Lantus, 7 orders for free diabetic supplies by [facility]

## 2014-02-16 NOTE — Assessment & Plan Note (Signed)
Assessment: Patient with a history of COPD but it is unclear to me as to how this was diagnosed and the patient cannot remember ever doing pulmonary function testing. However, it is clear the patient does not need to be on a daily control her medication because she has not used it in a year. Plan: continue intermittent use of albuterol. If albuterol use becomes more frequent one time per week, then restart control medication and obtain pulmonary function testing.

## 2014-02-22 ENCOUNTER — Other Ambulatory Visit: Payer: Self-pay | Admitting: Cardiology

## 2014-02-22 NOTE — Telephone Encounter (Signed)
Pt need metoprolol 25 mg refilled. I need to check with pt and see if she taking 1 tab bid or 1/2 tab bid. No answer at #, refill to [facility].

## 2014-02-23 MED ORDER — METOPROLOL TARTRATE 25 MG PO TABS: 25.0000 mg | ORAL_TABLET | Freq: Two times a day (BID) | ORAL | Status: DC

## 2014-02-23 NOTE — Telephone Encounter (Signed)
Verified with with pt that she is taking Metoprolol 25 mg 1 tab BID. Refilled medication.

## 2014-02-28 ENCOUNTER — Other Ambulatory Visit: Payer: Self-pay | Admitting: Family Medicine

## 2014-03-18 ENCOUNTER — Ambulatory Visit (INDEPENDENT_AMBULATORY_CARE_PROVIDER_SITE_OTHER): Payer: Medicare HMO | Admitting: Family Medicine

## 2014-03-18 ENCOUNTER — Encounter: Payer: Self-pay | Admitting: Family Medicine

## 2014-03-18 VITALS — BP 144/75 | HR 55 | Temp 97.4°F | Ht 60.0 in | Wt 194.8 lb

## 2014-03-18 DIAGNOSIS — M79676 Pain in unspecified toe(s): Secondary | ICD-10-CM | POA: Insufficient documentation

## 2014-03-18 DIAGNOSIS — J449 Chronic obstructive pulmonary disease, unspecified: Secondary | ICD-10-CM

## 2014-03-18 DIAGNOSIS — R0902 Hypoxemia: Secondary | ICD-10-CM | POA: Diagnosis not present

## 2014-03-18 DIAGNOSIS — E119 Type 2 diabetes mellitus without complications: Secondary | ICD-10-CM | POA: Diagnosis not present

## 2014-03-18 DIAGNOSIS — M79609 Pain in unspecified limb: Secondary | ICD-10-CM

## 2014-03-18 DIAGNOSIS — J4489 Other specified chronic obstructive pulmonary disease: Secondary | ICD-10-CM | POA: Diagnosis not present

## 2014-03-18 MED ORDER — INSULIN GLARGINE 100 UNIT/ML ~~LOC~~ SOLN: 13.0000 [IU] | SUBCUTANEOUS | Status: DC

## 2014-03-18 MED ORDER — DICLOFENAC SODIUM 1 % TD GEL: 2.0000 g | Freq: Four times a day (QID) | TRANSDERMAL | Status: DC

## 2014-03-18 MED ORDER — ALBUTEROL SULFATE (2.5 MG/3ML) 0.083% IN NEBU
2.5000 mg | INHALATION_SOLUTION | Freq: Once | RESPIRATORY_TRACT | Status: AC
  Administered 2014-03-18: 2.5 mg via RESPIRATORY_TRACT

## 2014-03-18 MED ORDER — DICLOFENAC SODIUM 75 MG PO TBEC: 75.0000 mg | DELAYED_RELEASE_TABLET | Freq: Two times a day (BID) | ORAL | Status: DC

## 2014-03-18 MED ORDER — IPRATROPIUM BROMIDE 0.02 % IN SOLN
0.5000 mg | Freq: Once | RESPIRATORY_TRACT | Status: AC
  Administered 2014-03-18: 0.5 mg via RESPIRATORY_TRACT

## 2014-03-18 NOTE — Progress Notes (Signed)
Subjective:    Patient ID: Kristi [name], female    DOB: 21-Jun-1941, 73 y.o.   MRN: [id number]  HPI  73 year old F with stage IV CKD, DM type 2, chronic knee pain and reported CHF (although last echo in 2013 WNL). Today she presents for follow up.   DM - pt taken off of glipizide at last visit b/c 2 episode of hypoglycemia, so only on Lantus 13 units q AM; the patient is concerned today that she has had elevated blood sugars this can negatively affect her kidneys; patient is very scared that she may have to go start hemodialysis; according to her meter the highest reading is 163, she denies any hypoglycemic events  Right great toe pain - started this week; pain on the plantar surface of the foot pain exacerbated by flexion of the toe no swelling or redness; no recent trauma  SOB/hypoxemia -   Documented in previous note the patient has several months of shortness of breath. It is not accompanied by weight gain, wheezing, illness, or hemoptysis. It is worsened with activity. At the last visit, the patient was given a new prescription for albuterol, which she has used twice in the last month. She felt that these uses provided relief from her shortness of breath. Additionally she sounds one of her Advair discs that was expired and decided to use it twice. She notes no effect.The patient presented today in clinic with marked shortness of breath while walking from the car. Her oxygen saturation was measured at 89%. She was allowed to sit for a few minutes, and then it was checked again. The second reading was also 89%. During that time he denied dyspnea.  Pt with written hx of COPD in chart and was prescribed albuterol and Advair in past, but no record of diagnostic COPD studies; also pt has written dx of CHF, but normal echo x 2 in our chart; pt with known CKD stage IV and anemia    Current Outpatient Prescriptions on File Prior to Visit  Medication Sig Dispense Refill  . acetaminophen-codeine  (TYLENOL #3) 300-30 MG per tablet       . albuterol (PROVENTIL HFA) 108 (90 BASE) MCG/ACT inhaler Inhale 2 puffs into the lungs every 4 (four) hours as needed. For wheezing  8.5 g  2  . amLODipine (NORVASC) 10 MG tablet Take 10 mg by mouth daily.      Marland Kitchen aspirin 325 MG tablet Take 325 mg by mouth at bedtime.       . [facility] ALLER[facility] RELIEF 180 MG tablet TAKE 1 TABLET BY MOUTH EVERY DAY  90 tablet  3  . doxycycline (VIBRA-TABS) 100 MG tablet Take 2 tablets (200 mg total) by mouth 2 (two) times daily.  40 tablet  0  . Fluticasone-Salmeterol (ADVAIR) 250-50 MCG/DOSE AEPB Inhale 1 puff into the lungs every morning.  60 each  0  . furosemide (LASIX) 40 MG tablet TAKE 1 TABLET BY MOUTH EVERY DAY  60 tablet  6  . glucosamine-chondroitin 500-400 MG tablet Take 1 tablet by mouth 2 (two) times daily.        Marland Kitchen glucose blood (ONE TOUCH ULTRA TEST) test strip Use as instructed  100 each  3  . HYDROcodone-acetaminophen (NORCO/VICODIN) 5-325 MG per tablet Take 1 tablet by mouth every 6 (six) hours as needed for moderate pain.  30 tablet  0  . insulin glargine (LANTUS) 100 UNIT/ML injection Inject 13 Units into the skin every morning. If  blood sugar is >140 add 1 unit. If blood sugar is <140 subtract 1 unit.      . Insulin Pen Needle (B-D ULTRAFINE III SHORT PEN) 31G X 8 MM MISC USE AS DIRECTED 2 TIMES A DAY  100 each  1  . LANTUS SOLOSTAR 100 UNIT/ML injection INJECT 13 UNITS EVERY MORNING AS DIRECTED  15 pen  2  . metoprolol tartrate (LOPRESSOR) 25 MG tablet Take 1 tablet (25 mg total) by mouth 2 (two) times daily.  180 tablet  1  . nitroGLYCERIN (NITROSTAT) 0.4 MG SL tablet Place 0.4 mg under the tongue every 5 (five) minutes as needed. For chest pain     Call 911 if pain not relieved by 3rd tab      . Omega-3 Fatty Acids (FISH OIL) 1200 MG CAPS Take 2 capsules by mouth 2 (two) times daily.      Marland Kitchen omeprazole (PRILOSEC) 20 MG capsule TAKE ONE CAPSULE BY MOUTH AT BEDTIME  90 capsule  3  . oxyCODONE-acetaminophen  (PERCOCET/ROXICET) 5-325 MG per tablet       . Polyethyl Glycol-Propyl Glycol (SYSTANE OP) Apply 1-2 drops to eye 4 (four) times daily as needed. For dry eyes      . rosuvastatin (CRESTOR) 20 MG tablet TAKE 1 TABLET BY MOUTH AT BEDTIME  30 tablet  6  . traZODone (DESYREL) 25 mg TABS tablet Take 0.5 tablets (25 mg total) by mouth at bedtime.  30 tablet  0  . vitamin B-12 (CYANOCOBALAMIN) 1000 MCG tablet Take 1,000 mcg by mouth every morning.        No current facility-administered medications on file prior to visit.     Review of Systems See history of present illness    Objective:   Physical Exam BP 144/75  Pulse 55  Temp(Src) 97.4 F (36.3 C) (Oral)  Ht 5' (1.524 m)  Wt 194 lb 12.8 oz (88.361 kg)  BMI 38.04 kg/m2  SpO2 89% Gen: elderly white female, chronically ill-appearing but not acutely distressed Cardiovascular: RRR, nl S1 and S2, systolic murmur Pulm: normal work of breathing, speaking in complete sentences, no crackles, no wheezes, no rhonchi or stridor Foot: margin tenderness to palpation on posterior plantar surface of the distal first metatarsal joint, no associated swelling or redness around the MTP joint, no pain with extension but marked pain on flexion Skin: warm, no rashes Lower extremities: 1+ pitting edema of her ankles which is her baseline   Wt Readings from Last 5 Encounters:  03/18/14 194 lb 12.8 oz (88.361 kg)  02/16/14 192 lb 1.6 oz (87.136 kg)  10/28/13 196 lb 12.8 oz (89.268 kg)  07/19/13 196 lb (88.905 kg)  04/08/13 190 lb (86.183 kg)       Assessment & Plan:  Hypoxemia - patient was given a DuoNeb treatment while in the office and her oxygen saturation improved to 96%  > 45 minutes spent evaluating patient, treating and coordinating care

## 2014-03-18 NOTE — Assessment & Plan Note (Signed)
Patient counseled on acceptable blood sugars. She was encouraged to continue Lantus at 13 units and avoid glipizide. She was educated that her greatest danger for mortality is actually low blood sugar and not high blood sugar.

## 2014-03-18 NOTE — Assessment & Plan Note (Signed)
This is a very perplexing situation, because the patient was hypoxemic but not dyspneic. My initial differential was very large with concern for COPD ( although as previously documented, I do not know how or when this was diagnosed), possible CHF exacerbation, possible fluid overload from renal failure, and anemia. The physical exam was noteworthy that she had no wheezes, no crackles, and her weight was stable. This would argue against the presence of volume overload or pulmonary inflammation. I gave her a trial of an abscess in her oxygen saturation improved markedly. Therefore I think we should focus on the lungs as a potential source of the hypoxemia. I instructed her to use her albuterol twice daily until she can return to see Dr. Valentina Lucks her lung function testing. I did not order pro BNP, because she did not want him overloaded because she has advanced CKD. I will consider an echocardiogram. Also consider measuring her hemoglobin, but if this were caused by anemia her oxygen level would not improve with albuterol. Follow up in 1-2 weeks.

## 2014-03-18 NOTE — Patient Instructions (Addendum)
Kristi [name], Naff to see you. We need to do the following things:   1. Shortness of breath - please use your albuterol twice a day. And then follow with Dr. Valentina Lucks as soon as possible for lung function testing. If you become more short of breath than please call me.  2. Toe pain - this could be gout or it could be arthritis. Please use the Voltaren gel on that spot. You can also ice it and wear a padded insole. If you take the Voltaren tablets, then use them only for 3 days at a time.  3. Diabetes - your sugars are fine. They're not high enough to have me concerned.  Please back in 2 weeks.  Dr. Maricela Bo

## 2014-03-18 NOTE — Assessment & Plan Note (Signed)
A: presentation is clearly consistent with gout do to lack of swelling and redness and very localized tenderness on the plantar surface of the distal first metatarsal, may be irritation of a sesamoid bone but interestingly the pain was not worsened by extension but rather flexion, maybe metatarsalgia may be a neuroma P: no acute trauma therefore no need for x-ray at this time, patient already taking a chronic intermittently for back pain this does not help. Therefore the patient needs an anti-inflammatory. She needs to be careful nonetheless because she has stage IV chronic kidney disease. I prescribed voltaren gel and tablets. If she can afford thegel and I recommend her using this for pain relief

## 2014-03-22 ENCOUNTER — Ambulatory Visit: Payer: Medicare Other | Admitting: Interventional Cardiology

## 2014-03-24 ENCOUNTER — Encounter: Payer: Self-pay | Admitting: Pharmacist

## 2014-03-24 ENCOUNTER — Ambulatory Visit (INDEPENDENT_AMBULATORY_CARE_PROVIDER_SITE_OTHER): Payer: Medicare HMO | Admitting: Pharmacist

## 2014-03-24 VITALS — BP 128/45 | HR 64 | Ht 61.0 in | Wt 195.2 lb

## 2014-03-24 DIAGNOSIS — J449 Chronic obstructive pulmonary disease, unspecified: Secondary | ICD-10-CM

## 2014-03-24 DIAGNOSIS — J4489 Other specified chronic obstructive pulmonary disease: Secondary | ICD-10-CM

## 2014-03-24 DIAGNOSIS — F17200 Nicotine dependence, unspecified, uncomplicated: Secondary | ICD-10-CM

## 2014-03-24 MED ORDER — VARENICLINE TARTRATE 0.5 MG X 11 & 1 MG X 42 PO MISC: ORAL | Status: DC

## 2014-03-24 MED ORDER — TIOTROPIUM BROMIDE MONOHYDRATE 2.5 MCG/ACT IN AERS: INHALATION_SPRAY | RESPIRATORY_TRACT | Status: DC

## 2014-03-24 NOTE — Assessment & Plan Note (Signed)
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 Initiated varenicline starter pack. Patient counseled on purpose, proper use, and potential adverse effects, including GI upset, and potential change in mood. F/U Rx Clinic Visit in early May.

## 2014-03-24 NOTE — Assessment & Plan Note (Signed)
Spirometry evaluation reveals mild obstructive lung disease corresponding to GOLD Classification A based on spirometry, 1 recent exacerbations. Has used albuterol in the past. Patient has been taking albuterol twice daily for one week. Initiate Spiriva Respimat 2 puffs daily at this time. Continue albuterol for rescue. Educated patient on purpose, proper use, potential adverse effects. Reviewed results of pulmonary function tests.  Pt verbalized understanding of results and education.  Written pt instructions provided.  Total time in face-to-face counseling 45 minutes.  Patient seen with Toribio Harbour, PharmD Candidate,  Silas [place],  PharmD Candidate.

## 2014-03-24 NOTE — Progress Notes (Signed)
S:  Patient arrives in good spirits for follow-up after O2 sat 89% on 03/18/14. Presents for lung function evaluation and evaluation/assistance with tobacco dependence. Patient reports breathing has been 8/10 and has been using her albuterol 2 times a day for the past week and "doesn't see a difference." Last albuterol dose at 8:30 this morning.  Age when started using tobacco on a daily basis 20. Number of Cigarettes per day 20 (previously 1.5-2 ppd) Has woken at night in the past to smoke, but not currently.  Most recent quit attempt 2012. Longest time ever been tobacco free 1 year. Medications used in past includes Chantix (a couple of months, concerned about depression AEs).  Rates READINESS of quitting tobacco on 1-10 scale of 8. Rates CONFIDENCE of quitting tobacco on 1-10 scale of 7. Triggers to use tobacco include; stress (several family members died during her most recent relapse)   O: See "scanned report" or Documentation Flowsheet (discrete results - PFTs) for  Spirometry results. Patient provided good effort while attempting spirometry.   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 Initiated varenicline starter pack. Patient counseled on purpose, proper use, and potential adverse effects, including GI upset, and potential change in mood. F/U Rx Clinic Visit in early May.    A/P: Spirometry evaluation reveals mild obstructive lung disease corresponding to GOLD Classification A based on spirometry, 1 recent exacerbations. Has used albuterol in the past. Patient has been taking albuterol twice daily for one week. Initiate Spiriva Respimat 2 puffs daily at this time. Continue albuterol for rescue. Educated patient on purpose, proper use, potential adverse effects. Reviewed results of pulmonary function tests.  Pt verbalized understanding of results and  education.  Written pt instructions provided.  Total time in face-to-face counseling 45 minutes.  Patient seen with Toribio Harbour, PharmD Candidate,  Silas [place],  PharmD Candidate.

## 2014-03-24 NOTE — Patient Instructions (Addendum)
Your lung function today demonstrated near-normal lungs. Spiriva will help control your breathing, and you can still use the albuterol when you have any extra wheezing.   We have sent prescriptions for Spiriva Respimat and Chantix to your pharmacy. Use 2 puffs of the Spiriva once daily and take the Chantix starter pack as directed.   Follow-up with Dr. Maricela Bo next week, and come back to Dr. Valentina Lucks in early May.

## 2014-03-25 NOTE — Progress Notes (Signed)
Patient ID: Kristi [name], female   DOB: 01/19/1941, 73 y.o.   MRN: [id number] Reviewed: Agree with Dr. Koval's documentation and management. 

## 2014-03-31 ENCOUNTER — Ambulatory Visit (INDEPENDENT_AMBULATORY_CARE_PROVIDER_SITE_OTHER): Payer: Commercial Managed Care - HMO | Admitting: Family Medicine

## 2014-03-31 ENCOUNTER — Encounter: Payer: Self-pay | Admitting: Family Medicine

## 2014-03-31 VITALS — BP 141/56 | HR 54 | Temp 97.9°F | Ht 60.0 in | Wt 193.8 lb

## 2014-03-31 DIAGNOSIS — M25579 Pain in unspecified ankle and joints of unspecified foot: Secondary | ICD-10-CM

## 2014-03-31 DIAGNOSIS — F17200 Nicotine dependence, unspecified, uncomplicated: Secondary | ICD-10-CM

## 2014-03-31 DIAGNOSIS — M25572 Pain in left ankle and joints of left foot: Secondary | ICD-10-CM

## 2014-03-31 DIAGNOSIS — M109 Gout, unspecified: Secondary | ICD-10-CM

## 2014-03-31 DIAGNOSIS — J449 Chronic obstructive pulmonary disease, unspecified: Secondary | ICD-10-CM

## 2014-03-31 LAB — URIC ACID: Uric Acid, Serum: 10.8 mg/dL — ABNORMAL HIGH (ref 2.4–7.0)

## 2014-03-31 NOTE — Progress Notes (Signed)
Subjective:    Patient ID: Kristi [name], female    DOB: November 16, 1941, 73 y.o.   MRN: [id number]  HPI  73 year old F with stage IV CKD, DM type 2, and GOLD classification A COPD who presents for follow up.   COPD - Recently had spirometry on 4/16 that revealed mild obstructive pattern; pt started on Spiriva. Pt reports no improvement in breathing, but was actually improved from her outpatient visit on 4/10 to her respiratory follow up on 4/16  Smoking Cessation - Pt started on Chantix on 4/16, has cut back to 4 cigarettes a day and plans to quit on 04/02/14; she is complaining of Chantix making her dizzy, she has moved up to two tablets a day  Ankle Pain - Left ankle, sudden onset with swelling and severe pain on Sunday, no fall or trauma, pain 10/10 on Sunday and gradually improved to 6/10; relieved with voltaren and oxycodone; pt recently evaluated for pain of left great toe which was thought to be gout vs sesamoiditis, but this has improved  PMH- gout, last uric acid checked in 2013, no hx of pt being on uric acid lower agent   Current Outpatient Prescriptions on File Prior to Visit  Medication Sig Dispense Refill  . acetaminophen-codeine (TYLENOL #3) 300-30 MG per tablet       . albuterol (PROVENTIL HFA) 108 (90 BASE) MCG/ACT inhaler Inhale 2 puffs into the lungs every 4 (four) hours as needed. For wheezing  8.5 g  2  . amLODipine (NORVASC) 10 MG tablet Take 10 mg by mouth daily.      Marland Kitchen aspirin 325 MG tablet Take 325 mg by mouth at bedtime.       . [facility] ALLERGY RELIEF 180 MG tablet TAKE 1 TABLET BY MOUTH EVERY DAY  90 tablet  3  . diclofenac (VOLTAREN) 75 MG EC tablet Take 1 tablet (75 mg total) by mouth 2 (two) times daily.  30 tablet  0  . diclofenac sodium (VOLTAREN) 1 % GEL Apply 2 g topically 4 (four) times daily.  100 g  2  . Fluticasone-Salmeterol (ADVAIR) 250-50 MCG/DOSE AEPB Inhale 1 puff into the lungs every morning.  60 each  0  . furosemide (LASIX) 40 MG tablet TAKE 1 TABLET  BY MOUTH EVERY DAY  60 tablet  6  . glucosamine-chondroitin 500-400 MG tablet Take 1 tablet by mouth 2 (two) times daily.        Marland Kitchen glucose blood (ONE TOUCH ULTRA TEST) test strip Use as instructed  100 each  3  . HYDROcodone-acetaminophen (NORCO/VICODIN) 5-325 MG per tablet Take 1 tablet by mouth every 6 (six) hours as needed for moderate pain.  30 tablet  0  . insulin glargine (LANTUS) 100 UNIT/ML injection Inject 0.13 mLs (13 Units total) into the skin every morning. If blood sugar is >140 add 1 unit. If blood sugar is <140 subtract 1 unit.  10 mL  2  . Insulin Pen Needle (B-D ULTRAFINE III SHORT PEN) 31G X 8 MM MISC USE AS DIRECTED 2 TIMES A DAY  100 each  1  . metoprolol tartrate (LOPRESSOR) 25 MG tablet Take 1 tablet (25 mg total) by mouth 2 (two) times daily.  180 tablet  1  . nitroGLYCERIN (NITROSTAT) 0.4 MG SL tablet Place 0.4 mg under the tongue every 5 (five) minutes as needed. For chest pain     Call 911 if pain not relieved by 3rd tab      .  Omega-3 Fatty Acids (FISH OIL) 1200 MG CAPS Take 2 capsules by mouth 2 (two) times daily.      Marland Kitchen omeprazole (PRILOSEC) 20 MG capsule TAKE ONE CAPSULE BY MOUTH AT BEDTIME  90 capsule  3  . oxyCODONE-acetaminophen (PERCOCET/ROXICET) 5-325 MG per tablet       . Polyethyl Glycol-Propyl Glycol (SYSTANE OP) Apply 1-2 drops to eye 4 (four) times daily as needed. For dry eyes      . rosuvastatin (CRESTOR) 20 MG tablet TAKE 1 TABLET BY MOUTH AT BEDTIME  30 tablet  6  . Tiotropium Bromide Monohydrate (SPIRIVA RESPIMAT) 2.5 MCG/ACT AERS Inhale 2 puffs once daily  1 Inhaler  11  . traZODone (DESYREL) 25 mg TABS tablet Take 0.5 tablets (25 mg total) by mouth at bedtime.  30 tablet  0  . varenicline (CHANTIX STARTING MONTH PAK) 0.5 MG X 11 & 1 MG X 42 tablet Take one 0.5 mg tablet by mouth once daily for 3 days, then increase to one 0.5 mg tablet twice daily for 4 days, then increase to one 1 mg tablet twice daily.  53 tablet  0  . vitamin B-12 (CYANOCOBALAMIN)  1000 MCG tablet Take 1,000 mcg by mouth every morning.        No current facility-administered medications on file prior to visit.     Review of Systems Positive for lightheadedness Negative for vertigo, fever, chills, nausea or vomiting     Objective:   Physical Exam BP 141/56  Pulse 54  Temp(Src) 97.9 F (36.6 C) (Oral)  Ht 5' (1.524 m)  Wt 193 lb 12.8 oz (87.907 kg)  BMI 37.85 kg/m2  SpO2 97%  Gen: elderly WF, obese, chronically ill appearing, non distressed Pulm: normal work of breathing Extremties: 2+ pitting edema bilaterally MSK: left ankle tender along lateral malleolus without erythema   Wt Readings from Last 5 Encounters:  03/31/14 193 lb 12.8 oz (87.907 kg)  03/24/14 195 lb 3.2 oz (88.542 kg)  03/18/14 194 lb 12.8 oz (88.361 kg)  02/16/14 192 lb 1.6 oz (87.136 kg)  10/28/13 196 lb 12.8 oz (89.268 kg)       Assessment & Plan:

## 2014-03-31 NOTE — Patient Instructions (Addendum)
Dear Ms. Frieden,   Thank you for coming to clinic today. Please read below regarding the issues that we discussed.   1. Dizzyness - Let's stick one blue pill for now. If the dizziness get's worse, then stop the medication. I know that you can stop! Good luck!  2. Breathing - I am glad that it is not getting worse. Stay with the spiriva for 1 more week. If not improving, then stop altogether.   3. Left ankle pain - This may be due to gout. I will check your uric acid level today.   Please follow up in clinic in 4 week. Please call earlier if you have any questions or concerns.   Sincerely,   Dr. Maricela Bo

## 2014-04-01 DIAGNOSIS — M109 Gout, unspecified: Secondary | ICD-10-CM | POA: Insufficient documentation

## 2014-04-01 NOTE — Assessment & Plan Note (Addendum)
Maintain chantix at 1 tablet (0.5 mg) per day given symptoms of dizziness

## 2014-04-01 NOTE — Assessment & Plan Note (Signed)
A: stable, no improvement with spiriva; no use of albuterol P: cont spiriva for 1 more week, if no improvement noted, then discontinue

## 2014-04-01 NOTE — Assessment & Plan Note (Signed)
A: likely gouty arthritis P: check uric acid level

## 2014-04-05 ENCOUNTER — Encounter: Payer: Self-pay | Admitting: Family Medicine

## 2014-04-05 ENCOUNTER — Telehealth: Payer: Self-pay | Admitting: Family Medicine

## 2014-04-05 NOTE — Telephone Encounter (Signed)
Left message for patient that uric acid level high and this causing gout flares. I told her that it would be best to discuss treatment options at her follow up with Dr. Valentina Lucks on 04/14/14 at 1:30 PM. I will be present for that meeting. Pt instructed to call back with any questions or concerns.

## 2014-04-06 ENCOUNTER — Ambulatory Visit (INDEPENDENT_AMBULATORY_CARE_PROVIDER_SITE_OTHER): Payer: Commercial Managed Care - HMO | Admitting: Interventional Cardiology

## 2014-04-06 ENCOUNTER — Encounter: Payer: Self-pay | Admitting: Interventional Cardiology

## 2014-04-06 VITALS — BP 148/88 | HR 50 | Ht 60.0 in | Wt 189.4 lb

## 2014-04-06 DIAGNOSIS — F17200 Nicotine dependence, unspecified, uncomplicated: Secondary | ICD-10-CM

## 2014-04-06 DIAGNOSIS — I252 Old myocardial infarction: Secondary | ICD-10-CM | POA: Insufficient documentation

## 2014-04-06 DIAGNOSIS — I2510 Atherosclerotic heart disease of native coronary artery without angina pectoris: Secondary | ICD-10-CM

## 2014-04-06 DIAGNOSIS — I10 Essential (primary) hypertension: Secondary | ICD-10-CM

## 2014-04-06 DIAGNOSIS — I509 Heart failure, unspecified: Secondary | ICD-10-CM

## 2014-04-06 DIAGNOSIS — E785 Hyperlipidemia, unspecified: Secondary | ICD-10-CM

## 2014-04-06 MED ORDER — METOPROLOL TARTRATE 25 MG PO TABS: 12.5000 mg | ORAL_TABLET | Freq: Two times a day (BID) | ORAL | Status: DC

## 2014-04-06 NOTE — Progress Notes (Signed)
[address], [place], [place]  [address] Phone: [phone] Fax:  [phone]  Date:  04/06/2014   ID:  Kristi [name], Kristi [name] 1941-04-12, MRN [id number]  PCP:  Dewain Penning, MD      History of Present Illness: Kristi [name] is a 73 y.o. female who had an inferior MI in 06/2007. She has renal insufficiency and had a fistula placed. She reports DOE. She is not exercising. No chest pain.  She is holding off of dialysis for now.   CAD/ASCVD:  c/o Dyspnea on exertion worse since she stopped exercising.  Denies : Chest pain.  Dizziness.  Fatigue.  Leg edema- resolved after taking extra furosemide and decreasing salt intake.  Nitroglycerin.  Orthopnea.  Palpitations.  Shortness of breath.  Syncope.     Wt Readings from Last 3 Encounters:  04/06/14 189 lb 6.4 oz (85.911 kg)  03/31/14 193 lb 12.8 oz (87.907 kg)  03/24/14 195 lb 3.2 oz (88.542 kg)     Past Medical History  Diagnosis Date  . Hyperlipidemia   . Hyperparathyroidism   . CAD (coronary artery disease)   . Myocardial infarction 2008  . Hypertension   . COPD (chronic obstructive pulmonary disease)   . Diabetes mellitus     diagnosed 2010 - takes oral meds and lantus insulin  . Anemia   . Chronic kidney disease     esrd - since 2010  . GERD (gastroesophageal reflux disease)     on omeprazole  . Arthritis     all over  . CHF (congestive heart failure) 08-2011  . CVA 02/19/2010  . CAD 02/19/2010  . DEEP VENOUS THROMBOPHLEBITIS, LEG, LEFT 03/08/2010    in 2009 s/p hospitalization- only 1 isolated dvt  . Stroke ?2010    found remote CVA on CT scan    Current Outpatient Prescriptions  Medication Sig Dispense Refill  . acetaminophen-codeine (TYLENOL #3) 300-30 MG per tablet       . albuterol (PROVENTIL HFA) 108 (90 BASE) MCG/ACT inhaler Inhale 2 puffs into the lungs every 4 (four) hours as needed. For wheezing  8.5 g  2  . amLODipine (NORVASC) 10 MG tablet Take 10 mg by mouth daily.      Marland Kitchen  aspirin 325 MG tablet Take 325 mg by mouth at bedtime.       . [facility] ALLERGY RELIEF 180 MG tablet TAKE 1 TABLET BY MOUTH EVERY DAY  90 tablet  3  . Fluticasone-Salmeterol (ADVAIR) 250-50 MCG/DOSE AEPB Inhale 1 puff into the lungs every morning.  60 each  0  . furosemide (LASIX) 40 MG tablet TAKE 1 TABLET BY MOUTH EVERY DAY  60 tablet  6  . glucosamine-chondroitin 500-400 MG tablet Take 1 tablet by mouth 2 (two) times daily.        Marland Kitchen glucose blood (ONE TOUCH ULTRA TEST) test strip Use as instructed  100 each  3  . HYDROcodone-acetaminophen (NORCO/VICODIN) 5-325 MG per tablet Take 1 tablet by mouth every 6 (six) hours as needed for moderate pain.  30 tablet  0  . insulin glargine (LANTUS) 100 UNIT/ML injection Inject 0.13 mLs (13 Units total) into the skin every morning. If blood sugar is >140 add 1 unit. If blood sugar is <140 subtract 1 unit.  10 mL  2  . Insulin Pen Needle (B-D ULTRAFINE III SHORT PEN) 31G X 8 MM MISC USE AS DIRECTED 2 TIMES A DAY  100 each  1  .  metoprolol tartrate (LOPRESSOR) 25 MG tablet Take 1 tablet (25 mg total) by mouth 2 (two) times daily.  180 tablet  1  . nitroGLYCERIN (NITROSTAT) 0.4 MG SL tablet Place 0.4 mg under the tongue every 5 (five) minutes as needed. For chest pain     Call 911 if pain not relieved by 3rd tab      . Omega-3 Fatty Acids (FISH OIL) 1200 MG CAPS Take 2 capsules by mouth 2 (two) times daily.      Marland Kitchen omeprazole (PRILOSEC) 20 MG capsule TAKE ONE CAPSULE BY MOUTH AT BEDTIME  90 capsule  3  . oxyCODONE-acetaminophen (PERCOCET/ROXICET) 5-325 MG per tablet       . Polyethyl Glycol-Propyl Glycol (SYSTANE OP) Apply 1-2 drops to eye 4 (four) times daily as needed. For dry eyes      . rosuvastatin (CRESTOR) 20 MG tablet TAKE 1 TABLET BY MOUTH AT BEDTIME  30 tablet  6  . Tiotropium Bromide Monohydrate (SPIRIVA RESPIMAT) 2.5 MCG/ACT AERS Inhale 2 puffs once daily  1 Inhaler  11  . vitamin B-12 (CYANOCOBALAMIN) 1000 MCG tablet Take 1,000 mcg by mouth every morning.         No current facility-administered medications for this visit.    Allergies:   No Known Allergies  Social History:  The patient  reports that she has been smoking Cigarettes.  She started smoking about 53 years ago. She has a 26.5 pack-year smoking history. She has never used smokeless tobacco. She reports that she does not drink alcohol or use illicit drugs.   Family History:  The patient's family history includes Kristi [name] in her father; Kristi [name] in her mother; Kristi [name] in her father; Kristi [name] in her father.   ROS:  Please see the history of present illness.  No nausea, vomiting.  No fevers, chills.  No focal weakness.  No dysuria. Chronic ankle edema; L>R,   All other systems reviewed and negative.   PHYSICAL EXAM: VS:  BP 148/88  Pulse 50  Ht 5' (1.524 m)  Wt 189 lb 6.4 oz (85.911 kg)  BMI 36.99 kg/m2 Well nourished, well developed, in no acute distress HEENT: normal Neck: no JVD, no carotid bruits Cardiac:  normal S1, S2; RRR;  Lungs:  clear to auscultation bilaterally, no wheezing, rhonchi or rales Abd: soft, nontender, no hepatomegaly Ext: L>R ankle edema Skin: warm and dry Neuro:   no focal abnormalities noted  EKG:  NSR, lateral ST wave depressions and loss    ASSESSMENT AND PLAN:  Coronary atherosclerosis of native coronary artery  Continue Crestor Tablet, 20 MG, 1 tablet, Orally, Once a day, 30 day(s), 30 IMAGING: EKG    Harward,Amy 03/23/2013 11:37:35 AM > Lilliane Sposito,JAY 03/23/2013 12:08:21 PM >Sinus bradycardia, NSST   Notes: No angina. Doing well. No CHF. Lipids checked by Nephrologist showing LDL 120 in 2013. would not pursue any ischemia workup at this time. Her creatinine is borderline and I would not perform cardiac cath unless it was an emergency.    2. Pure hypercholesterolemia  Stop Fish Oil Capsule, 1200 MG, 2 tablets, Orally, TWICE A DAY Continue Crestor Tablet, 20 MG, 1 tablet, Orally, Once a day Notes: Now on crestor. Check cholesterol with next  blood draw at nephrologist.  She wants to try to take [place].  OK to switch, but I asked her to check with Dr. Lorrene Reid to make sure there are no renal efects.  Will have to see what her cholesterol does.   3. Hypertension, essential  Continue Amlodipine Besylate Tablet, 10 mg, 1 tablet, Once a dau Notes: Mildly elevated at home 120-145/55-60. COntinue current meds. Amlodipine was stopped due to low BP when she was in the hospital. Target BP < 130/80.    4. Shortness of breath  Continue Furosemide Tablet, 40 MG, 1 tablet, Orally, Once a day Notes: Episode of fluid overload a few weeks ago. Resolved with diuretics.  Stop smoking.    5. Bradycardia: HR in the 40s on ECG.  WIll decrease metoprolol to 12.5 mg BID.  If HR is still low ok to stop the metoprolol.    Preventive Medicine  Adult topics discussed:  Smoking cessation:  Patient advised to quit smoking      Signed, Mina Marble, MD, [facility] 04/06/2014 10:02 AM

## 2014-04-06 NOTE — Patient Instructions (Signed)
Your physician has recommended you make the following change in your medication:   1. Decrease Metoprolol 25 mg to 1/2 tablet twice a day.   Your physician wants you to follow-up in: 1 year with Dr. Irish Lack. You will receive a reminder letter in the mail two months in advance. If you don't receive a letter, please call our office to schedule the follow-up appointment.

## 2014-04-14 ENCOUNTER — Ambulatory Visit (INDEPENDENT_AMBULATORY_CARE_PROVIDER_SITE_OTHER): Payer: Commercial Managed Care - HMO | Admitting: Pharmacist

## 2014-04-14 ENCOUNTER — Encounter: Payer: Self-pay | Admitting: Pharmacist

## 2014-04-14 VITALS — BP 137/70 | HR 58 | Ht 61.42 in | Wt 190.6 lb

## 2014-04-14 DIAGNOSIS — E1139 Type 2 diabetes mellitus with other diabetic ophthalmic complication: Secondary | ICD-10-CM

## 2014-04-14 DIAGNOSIS — F17200 Nicotine dependence, unspecified, uncomplicated: Secondary | ICD-10-CM

## 2014-04-14 DIAGNOSIS — I10 Essential (primary) hypertension: Secondary | ICD-10-CM

## 2014-04-14 DIAGNOSIS — E11319 Type 2 diabetes mellitus with unspecified diabetic retinopathy without macular edema: Secondary | ICD-10-CM

## 2014-04-14 MED ORDER — LOSARTAN POTASSIUM 25 MG PO TABS: 25.0000 mg | ORAL_TABLET | Freq: Every day | ORAL | Status: DC

## 2014-04-14 NOTE — Assessment & Plan Note (Signed)
Chronic hypertension with CKD.  Currently with BP of 137/70 without complaint of significant orthostasis. Decrease amlodipine to 5mg  daily (1/2 of the 10mg  tablet), start cozaar (Losartan) 25mg , and follow-up in clinic in one week reevaluate Edema, Scr and Potassium.   Consider reevaluation of uric acid after initiation and potential titration to decrease risk of gout attacks with elevated uric acid.  Patient did NOT desire additional medication for gout treatment at this time.

## 2014-04-14 NOTE — Patient Instructions (Addendum)
Thank you for coming to your appointment today!  You are doing an excellent job with your smoking cessation. Keep up the good work!  A new blood pressure medication, Losartan 25mg  daily, will be added to your regimen.  Decrease your amlodipine to 5mg  (1/2 tablet of 10mg ) daily.   Please make an appointment to follow-up in clinic in one week.

## 2014-04-14 NOTE — Progress Notes (Signed)
S:    Patient arrives to the room with wheezing and upset due to recent smoking cessation. Presents for diabetes, HTN, and smoking cessation follow up. Her personal goal glucose level is verbalized to be less than 110. Patient has been on amlodipine and has taken furosemide for leg swelling for many years.  Patient has attempted to quit smoking in the past several times but has relapsed due to stressors. Varenicline has been tried in the past, but discontinued due to nausea.   O:  . Lab Results  Component Value Date   HGBA1C 6.4 02/16/2014     Some home fasting CBG readings ~ 60 but denies symptoms of hypoglycemia. Edema in the legs and feet  trace Trace - 1+ bilaterally.    A/P: Long-standing diabetes with some episodes of hypoglycemia based on reported CBGs, but however denies symptoms.  No changes will be made to her diabetic regimen at this time.  Reassess CBGs and symptoms at next visit.   Chronic hypertension with CKD.  Currently with BP of 137/70 without complaint of significant orthostasis. Decrease amlodipine to 5mg  daily (1/2 of the 10mg  tablet), start cozaar (Losartan) 25mg , and follow-up in clinic in one week reevaluate Edema, Scr and Potassium.   Consider reevaluation of uric acid after initiation and potential titration to decrease risk of gout attacks with elevated uric acid.  Patient did NOT desire additional medication for gout treatment at this time.    Long history of tobacco dependence - on day #11 without a cigarette. She was upset today about quitting, but remains determined to stop.  Did NOT tolerate varenicline.  NO additional therapy at this time.   Reevaluate at next visit.   Written patient instructions provided.  Follow up in Pharmacist Clinic Visit in one week.   Total time in face to face counseling 20 minutes.  Patient seen with  [place], PharmD Candidate,  Sheliah Mends,  PharmD Resident, [place] PharmD Resident.

## 2014-04-14 NOTE — Assessment & Plan Note (Signed)
Long history of tobacco dependence - on day #11 without a cigarette. She was upset today about quitting, but remains determined to stop.  Did NOT tolerate varenicline.  NO additional therapy at this time.   Reevaluate at next visit.

## 2014-04-14 NOTE — Assessment & Plan Note (Signed)
Long-standing diabetes with some episodes of hypoglycemia based on reported CBGs, but however denies symptoms.  No changes will be made to her diabetic regimen at this time.  Reassess CBGs and symptoms at next visit.

## 2014-04-18 NOTE — Progress Notes (Signed)
Patient ID: Kristi [name], female   DOB: [birth year], 73 y.o.   MRN: [id number] Reviewed: Agree with Dr. Graylin Shiver documentation and management.

## 2014-04-22 ENCOUNTER — Encounter: Payer: Self-pay | Admitting: Family Medicine

## 2014-04-22 ENCOUNTER — Ambulatory Visit (INDEPENDENT_AMBULATORY_CARE_PROVIDER_SITE_OTHER): Payer: Commercial Managed Care - HMO | Admitting: Family Medicine

## 2014-04-22 VITALS — BP 124/71 | HR 59 | Temp 98.3°F | Wt 187.4 lb

## 2014-04-22 DIAGNOSIS — F17200 Nicotine dependence, unspecified, uncomplicated: Secondary | ICD-10-CM

## 2014-04-22 DIAGNOSIS — I10 Essential (primary) hypertension: Secondary | ICD-10-CM

## 2014-04-22 DIAGNOSIS — N184 Chronic kidney disease, stage 4 (severe): Secondary | ICD-10-CM

## 2014-04-22 DIAGNOSIS — M25569 Pain in unspecified knee: Secondary | ICD-10-CM

## 2014-04-22 DIAGNOSIS — M25561 Pain in right knee: Secondary | ICD-10-CM

## 2014-04-22 DIAGNOSIS — M109 Gout, unspecified: Secondary | ICD-10-CM

## 2014-04-22 LAB — URIC ACID: Uric Acid, Serum: 12.7 mg/dL — ABNORMAL HIGH (ref 2.4–7.0)

## 2014-04-22 LAB — BASIC METABOLIC PANEL
BUN: 59 mg/dL — ABNORMAL HIGH (ref 6–23)
CHLORIDE: 101 meq/L (ref 96–112)
CO2: 23 mEq/L (ref 19–32)
Calcium: 9.7 mg/dL (ref 8.4–10.5)
Creat: 3.01 mg/dL — ABNORMAL HIGH (ref 0.50–1.10)
Glucose, Bld: 152 mg/dL — ABNORMAL HIGH (ref 70–99)
POTASSIUM: 4.4 meq/L (ref 3.5–5.3)
Sodium: 138 mEq/L (ref 135–145)

## 2014-04-22 MED ORDER — HYDROCODONE-ACETAMINOPHEN 5-325 MG PO TABS: 1.0000 | ORAL_TABLET | Freq: Two times a day (BID) | ORAL | Status: DC | PRN

## 2014-04-22 NOTE — Assessment & Plan Note (Signed)
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

## 2014-04-22 NOTE — Patient Instructions (Signed)
Dear Ms Araque,   Thank you for coming to clinic today. Please read below regarding the issues that we discussed.   1. Blood Pressure - It looks great today. Please continue your current medications. If we need to change to the 5 mg amlodipine tablets, then please let me know  2. Gout - I think that we should start allopurinol. Let me get your uric acid level back before we do that.   3. Pain - Use the hydrocodone as needed for joint pain.   4. Labs - I will call you if anything is abnormal.   Please follow up in clinic in 1 month. Please call earlier if you have any questions or concerns.   Sincerely,   Dr. Maricela Bo

## 2014-04-22 NOTE — Progress Notes (Signed)
   Subjective:    Patient ID: Kristi [name], female    DOB: Mar 18, 1941, 73 y.o.   MRN: [id number]  HPI  73 year old F with stage IV CKD, DM type 2, and GOLD classification A COPD who presents for follow up. She saw Dr. Valentina Lucks in clinic 8 days ago   Tobacco Cessation - Pt was on chantix but stopped that due to dizziness. Stopped 19 days ago and feels much better than last week. Still has cravings, but not as intense.   Gout - pt with repeated athralgias and very high uric acid (10.8 on 4/23) , concerning for gout flares. Therefore, she was recently started on losartan, b/c the patient did not want to be on allopurinol. Today she notes severe pain in inflammation of the distal joint on the 4th digit of her right hand. It started two days ago. No trauma.   HTN - chronic issue, decreased amlodipine to 5 mg and started losartan 25 mg last week for purposes of lower uric acid.   Chronic Pain - Pt with chronic msk pain of left ankle and lower back worsened after working. She would like to continue hydrocodone PRN for relief. She took one today. Her last refill was in February 16, 2014 for 30 tablets, and she estimates that she has 15 tablets left. No side effects. Increases her functional status at home, and she lives alone.   Review of Systems No fever, chills, nausea or vomiting     Objective:   Physical Exam BP 124/71  Pulse 59  Temp(Src) 98.3 F (36.8 C) (Oral)  Wt 187 lb 6.4 oz (85.004 kg)  SpO2 93%  Gen: elderly WF, obese, chronically ill appearing, non distressed  Pulm: increased work of breathing, lungs CTA-B without wheezing Extremties: 2+ pitting edema bilaterally  MSK: very erythematous, warm, tender at DIP joint of right ring finger consistent with gout, no palpable effusion       Assessment & Plan:

## 2014-04-22 NOTE — Assessment & Plan Note (Signed)
A: well controlled with addition of losartan and reduction of amlodipine P: cont current meds but check metabolic panel to make sure kidneys can tolerated the ARB

## 2014-04-22 NOTE — Assessment & Plan Note (Signed)
Still quit. Encouraged patient to continue.

## 2014-04-22 NOTE — Assessment & Plan Note (Signed)
BP well controlled; check creat and K+ today

## 2014-04-25 ENCOUNTER — Telehealth: Payer: Self-pay | Admitting: Family Medicine

## 2014-04-25 DIAGNOSIS — E790 Hyperuricemia without signs of inflammatory arthritis and tophaceous disease: Secondary | ICD-10-CM

## 2014-04-25 MED ORDER — FEBUXOSTAT 40 MG PO TABS: 40.0000 mg | ORAL_TABLET | Freq: Every day | ORAL | Status: DC

## 2014-04-25 NOTE — Telephone Encounter (Signed)
I explained to pt that creatinine increased as did her uric acid. Therefore, I want to stop losartan to protect the kidneys and start uloric to help lower the uric acid. Pt agreeable to plan. I told her to follow up with nephrologist on 05/09/14 for her next creatinine check. She was agreeable.

## 2014-04-26 ENCOUNTER — Other Ambulatory Visit: Payer: Self-pay | Admitting: Family Medicine

## 2014-04-26 DIAGNOSIS — M109 Gout, unspecified: Secondary | ICD-10-CM

## 2014-04-26 MED ORDER — ALLOPURINOL 100 MG PO TABS: 200.0000 mg | ORAL_TABLET | Freq: Every day | ORAL | Status: DC

## 2014-04-27 ENCOUNTER — Other Ambulatory Visit: Payer: Self-pay | Admitting: Family Medicine

## 2014-04-27 ENCOUNTER — Ambulatory Visit: Payer: Commercial Managed Care - HMO | Admitting: Family Medicine

## 2014-05-10 LAB — BASIC METABOLIC PANEL
CREATININE: 2.8 mg/dL — AB (ref <=1.1)
GLUCOSE: 169 mg/dL

## 2014-05-10 LAB — URIC ACID: URIC ACID: 7.6

## 2014-05-16 ENCOUNTER — Encounter: Payer: Self-pay | Admitting: Family Medicine

## 2014-05-20 ENCOUNTER — Encounter: Payer: Self-pay | Admitting: Family Medicine

## 2014-05-20 ENCOUNTER — Ambulatory Visit (INDEPENDENT_AMBULATORY_CARE_PROVIDER_SITE_OTHER): Payer: Commercial Managed Care - HMO | Admitting: Family Medicine

## 2014-05-20 VITALS — BP 117/61 | HR 45 | Temp 97.6°F | Wt 185.7 lb

## 2014-05-20 DIAGNOSIS — D229 Melanocytic nevi, unspecified: Secondary | ICD-10-CM

## 2014-05-20 DIAGNOSIS — F17200 Nicotine dependence, unspecified, uncomplicated: Secondary | ICD-10-CM

## 2014-05-20 DIAGNOSIS — M109 Gout, unspecified: Secondary | ICD-10-CM

## 2014-05-20 DIAGNOSIS — D485 Neoplasm of uncertain behavior of skin: Secondary | ICD-10-CM

## 2014-05-20 DIAGNOSIS — R001 Bradycardia, unspecified: Secondary | ICD-10-CM

## 2014-05-20 DIAGNOSIS — I10 Essential (primary) hypertension: Secondary | ICD-10-CM

## 2014-05-20 DIAGNOSIS — E1139 Type 2 diabetes mellitus with other diabetic ophthalmic complication: Secondary | ICD-10-CM

## 2014-05-20 DIAGNOSIS — E11319 Type 2 diabetes mellitus with unspecified diabetic retinopathy without macular edema: Secondary | ICD-10-CM

## 2014-05-20 DIAGNOSIS — I498 Other specified cardiac arrhythmias: Secondary | ICD-10-CM

## 2014-05-20 DIAGNOSIS — E119 Type 2 diabetes mellitus without complications: Secondary | ICD-10-CM

## 2014-05-20 LAB — POCT GLYCOSYLATED HEMOGLOBIN (HGB A1C): Hemoglobin A1C: 7.7

## 2014-05-20 MED ORDER — METOPROLOL TARTRATE 25 MG PO TABS: 12.5000 mg | ORAL_TABLET | Freq: Every day | ORAL | Status: DC

## 2014-05-20 MED ORDER — GLIPIZIDE ER 5 MG PO TB24: 5.0000 mg | ORAL_TABLET | Freq: Every day | ORAL | Status: DC

## 2014-05-20 MED ORDER — AMLODIPINE BESYLATE 5 MG PO TABS: 5.0000 mg | ORAL_TABLET | Freq: Every day | ORAL | Status: DC

## 2014-05-20 NOTE — Progress Notes (Signed)
Subjective:    Patient ID: Kristi [name], female    DOB: 1941/10/16, 73 y.o.   MRN: [id number]  HPI  73 year old F with stage IV CKD, DM type 2, and GOLD classification A COPD who presents for follow up  Tobacco Cessation - Pt was on chantix but stopped that due to dizziness. > 30 days without smoking and denies cravings. She is excited about her success.   Gout - Pt with recurrent polyarticular gout flares and hyperuricemia. Was started on allopurinol and losartan in May 2015, but this made her creat and uric acid increase. The allopurinol was started. Febuxostat was ordered but insurance would not cover it so never started. Last uric acid level checked on 05/10/14 was 7.6 She is concerned about tophi in her finger.   Skin lesion - located on the right leg, dark tender area, first noticed in the last few days, concerning to the patient   Diabetes - pt with A1C of 6.4 and lightheadedness in April, stopped glipizide and only on Losartan, pt concerned that CBG's continue to be in the high 100s and low 200s, this makes her feel worse; she denies any low blood sugar; A1C today is 7.7  Lightheadedness - pt with known vertigo, but also getting lightheaded when going from sitting to standing, no syncope and resolved within 30 secs, she is compliant with BP meds   PMH - Stage IV CKD, DM, retinopathy, HTN  Current Outpatient Prescriptions on File Prior to Visit  Medication Sig Dispense Refill  . acetaminophen-codeine (TYLENOL #3) 300-30 MG per tablet 1 tablet every 6 (six) hours as needed for moderate pain.       Marland Kitchen albuterol (PROVENTIL HFA) 108 (90 BASE) MCG/ACT inhaler Inhale 2 puffs into the lungs every 4 (four) hours as needed. For wheezing  8.5 g  2  . allopurinol (ZYLOPRIM) 100 MG tablet Take 2 tablets (200 mg total) by mouth daily.  60 tablet  2  . amLODipine (NORVASC) 10 MG tablet Take 0.5 tablets (5 mg total) by mouth daily.      Marland Kitchen aspirin 325 MG tablet Take 325 mg by mouth at bedtime.        . [facility] ALLERGY RELIEF 180 MG tablet TAKE 1 TABLET BY MOUTH EVERY DAY  90 tablet  3  . febuxostat (ULORIC) 40 MG tablet Take 1 tablet (40 mg total) by mouth daily.  30 tablet  5  . glucosamine-chondroitin 500-400 MG tablet Take 1 tablet by mouth 2 (two) times daily.        Marland Kitchen glucose blood (ONE TOUCH ULTRA TEST) test strip Use as instructed  100 each  3  . HYDROcodone-acetaminophen (NORCO/VICODIN) 5-325 MG per tablet Take 1 tablet by mouth every 12 (twelve) hours as needed for moderate pain.  60 tablet  0  . insulin glargine (LANTUS) 100 UNIT/ML injection Inject 0.13 mLs (13 Units total) into the skin every morning. If blood sugar is >140 add 1 unit. If blood sugar is <140 subtract 1 unit.  10 mL  2  . Insulin Pen Needle (B-D ULTRAFINE III SHORT PEN) 31G X 8 MM MISC USE AS DIRECTED 2 TIMES A DAY  100 each  1  . losartan (COZAAR) 25 MG tablet Take 1 tablet (25 mg total) by mouth daily.  1 tablet  1  . metoprolol tartrate (LOPRESSOR) 25 MG tablet Take 0.5 tablets (12.5 mg total) by mouth 2 (two) times daily.  90 tablet  3  . nitroGLYCERIN (  NITROSTAT) 0.4 MG SL tablet Place 0.4 mg under the tongue every 5 (five) minutes as needed. For chest pain     Call 911 if pain not relieved by 3rd tab      . Omega-3 Fatty Acids (FISH OIL) 1200 MG CAPS Take 2 capsules by mouth 2 (two) times daily.      Marland Kitchen omeprazole (PRILOSEC) 20 MG capsule TAKE ONE CAPSULE BY MOUTH AT BEDTIME  90 capsule  3  . Polyethyl Glycol-Propyl Glycol (SYSTANE OP) Apply 1-2 drops to eye 4 (four) times daily as needed. For dry eyes      . rosuvastatin (CRESTOR) 20 MG tablet TAKE 1 TABLET BY MOUTH AT BEDTIME  30 tablet  6  . Tiotropium Bromide Monohydrate (SPIRIVA RESPIMAT) 2.5 MCG/ACT AERS Inhale 2 puffs once daily  1 Inhaler  11  . vitamin B-12 (CYANOCOBALAMIN) 1000 MCG tablet Take 1,000 mcg by mouth every morning.        No current facility-administered medications on file prior to visit.     Review of Systems Positive for  lightheadedness, vertigo, suspicious lesion on right leg, chronic back pain, right ankle pain, edema of feet] Negative for new chest pain, shortness of breath    Objective:   Physical Exam BP 117/61  Pulse 45  Temp(Src) 97.6 F (36.4 C) (Oral)  Wt 185 lb 11.2 oz (84.233 kg)  Gen: elderly chronically ill appearing but non distressed HEENT: NCAT, PERRLA, OP clear and moist  CV: sinus bradycardia Pulm: CTA-B Finger: tophi on DIP joint of 4th digit on right hand Leg: dark irregular mole on right medial calf Feet: 2+ pitting edema on dorsum of feet      Assessment & Plan:

## 2014-05-20 NOTE — Patient Instructions (Signed)
Dear [name],   Thank you for coming to clinic today. Please read below regarding the issues that we discussed.   1. Mole on your leg - I will take that off for you on June 23rd at 11 AM.   2. Blood pressure - Continue the amlodipine at 5 mg daily. Decrease the metoprolol to once daily at night since you are dizzy and have a low heart rate.   3. Diabetes - Please start the glipizide 5 mg daily. Decrease your Lantus so you don't get too high.   Please follow up in clinic on Tuesday June 23rd. Please call earlier if you have any questions or concerns.   Sincerely,   Dr. Maricela Bo

## 2014-05-22 ENCOUNTER — Other Ambulatory Visit: Payer: Self-pay | Admitting: Family Medicine

## 2014-05-22 DIAGNOSIS — D229 Melanocytic nevi, unspecified: Secondary | ICD-10-CM | POA: Insufficient documentation

## 2014-05-22 DIAGNOSIS — R001 Bradycardia, unspecified: Secondary | ICD-10-CM | POA: Insufficient documentation

## 2014-05-22 NOTE — Assessment & Plan Note (Signed)
A: HR of 45 with symptomatic transitions from sitting to standing, concerning for too much beta blockade with possible contribution from diabetic autonomic neuropathy/dysfunction P: reduce metoprolol from 12.5 BID to 12.5 once daily at night so that less medicine in the system while ambulating during the day

## 2014-05-22 NOTE — Assessment & Plan Note (Signed)
A: uric acid markedly improved on allopurinol P:  - cont allopurinol 200 mg daily - counseled on natural course of tophi

## 2014-05-22 NOTE — Assessment & Plan Note (Signed)
A: increased A1C to 7.7 in setting of non-adherence to diabetic diet as well as discontinuing glipizide in April in favor of using only long acting insulin; in theory, we should be able to titrate insulin to help with elevated CBG and avoiding hypogycemia; however the patient is adamant about restarted glipizide to control her sugars and b/c she notes that is helps her feel symptomatically better P: restart glipizide XL 5 mg daily; continue Lantus at 13 units daily

## 2014-05-22 NOTE — Assessment & Plan Note (Signed)
She continues to be tobacco free! Very encouraged about this.

## 2014-05-22 NOTE — Assessment & Plan Note (Signed)
A: right medial calf, concerning for melanoma P: have pt return for excision

## 2014-05-22 NOTE — Assessment & Plan Note (Signed)
A: low-normal BP P:  - cont amlodipine at 5 mg daily, consider decrease as needed - decrease metoprolol to 12.5 mg once daily, which is non-standard treatment for this medicine, but we can change to toprol XL 12.5 if she tolerates this

## 2014-05-31 ENCOUNTER — Encounter: Payer: Self-pay | Admitting: Family Medicine

## 2014-05-31 ENCOUNTER — Ambulatory Visit (INDEPENDENT_AMBULATORY_CARE_PROVIDER_SITE_OTHER): Payer: Commercial Managed Care - HMO | Admitting: Family Medicine

## 2014-05-31 VITALS — BP 114/69 | HR 76 | Temp 97.8°F | Wt 185.0 lb

## 2014-05-31 DIAGNOSIS — D229 Melanocytic nevi, unspecified: Secondary | ICD-10-CM

## 2014-05-31 DIAGNOSIS — L989 Disorder of the skin and subcutaneous tissue, unspecified: Secondary | ICD-10-CM

## 2014-05-31 DIAGNOSIS — D485 Neoplasm of uncertain behavior of skin: Secondary | ICD-10-CM

## 2014-05-31 NOTE — Assessment & Plan Note (Signed)
A: inflamed actinic keratosis vs abnormal nevus P: saucerization biopsy with dermablade since concern for melanoma low

## 2014-05-31 NOTE — Patient Instructions (Signed)
Dear [name],   I think that this lesion is benign, but I am glad that we are checking to make sure since it recently changed color.   For wound care, keep that dressing on for the next 24 hours. Then you can change it and keep a Band-Aid on. Also, put a thin film of antibiotic ointment on there for 2-3 days. Restart the Aspirin in 2 days. If you develop worsening pain, redness, bleeding, swelling or discharge, then please come back to be evaluated.   It was an absolute privilege to be your doctor, and I will miss having you as a patient. The next folks to come along will take good care of your as well.   Sincerely,   Wonda Amis, MD

## 2014-05-31 NOTE — Progress Notes (Signed)
   Subjective:    Patient ID: Kristi [name], female    DOB: 12-Apr-1941, 73 y.o.   MRN: [id number]  HPI   Skin lesion - right leg, several years duration, recently change color and texture but not size; when evaluated on 05/20/14, it was dark and scabbed and tender, now has turned lighter with a red tint; pt denies any similar lesions on her body  PMH - no personal hx of skin cancer  Review of Systems     Objective:   Physical Exam BP 114/69  Pulse 76  Temp(Src) 97.8 F (36.6 C) (Oral)  Wt 185 lb (83.915 kg) Gen: elderly white female, chronically ill appearing Skin: right medial calf with 3 mm x 4 mm flat lesion with irregular borders and a partial red hue; much lighter than on previous evaluation, non tender       Assessment & Plan:   Shave Biopsy Procedure Note  Pre-operative Diagnosis: Suspicious lesion  Post-operative Diagnosis: same  Locations:right calf  Indications: diagnosis  Anesthesia: Lidocaine 2% with epinephrine without added sodium bicarbonate  Procedure Details  History of allergy to iodine: no  Patient informed of the risks (including bleeding and infection) and benefits of the  procedure and Written informed consent obtained.  The lesion and surrounding area were given a sterile prep using alcohol and draped in the usual sterile fashion. A dermablade was used to shave an area of skin approximately 0.5cm by 0.5cm.  Hemostasis achieved with monopolar electrodesiccation. Antibiotic ointment and a sterile dressing applied.  The specimen was sent for pathologic examination. The patient tolerated the procedure well.  EBL: 10 ml  Findings: lesion  Condition: Stable  Complications: none.  Plan: 1. Instructed to keep the wound dry and covered for 24-48h and clean thereafter. 2. Warning signs of infection were reviewed.   3. Recommended that the patient use OTC acetaminophen as needed for pain.  4. Return in PRN

## 2014-06-01 ENCOUNTER — Ambulatory Visit: Payer: Commercial Managed Care - HMO | Admitting: Family Medicine

## 2014-06-01 ENCOUNTER — Telehealth: Payer: Self-pay | Admitting: Family Medicine

## 2014-06-01 NOTE — Telephone Encounter (Signed)
Message left that derm specimen was benign. No follow up needed.

## 2014-07-22 ENCOUNTER — Other Ambulatory Visit: Payer: Self-pay | Admitting: *Deleted

## 2014-07-22 MED ORDER — ALLOPURINOL 100 MG PO TABS: 200.0000 mg | ORAL_TABLET | Freq: Every day | ORAL | Status: DC

## 2014-08-11 ENCOUNTER — Other Ambulatory Visit: Payer: Self-pay | Admitting: Family Medicine

## 2014-09-06 ENCOUNTER — Other Ambulatory Visit: Payer: Self-pay | Admitting: *Deleted

## 2014-09-06 MED ORDER — ALLOPURINOL 100 MG PO TABS: 200.0000 mg | ORAL_TABLET | Freq: Every day | ORAL | Status: DC

## 2014-09-06 NOTE — Telephone Encounter (Signed)
Medication refilled. I would like to meet this patient soon, as I have not yet seen her in clinic. Appointment set up in 2 weeks. I look forward to meeting her then.

## 2014-09-16 ENCOUNTER — Ambulatory Visit (INDEPENDENT_AMBULATORY_CARE_PROVIDER_SITE_OTHER): Payer: Medicare HMO | Admitting: Family Medicine

## 2014-09-16 ENCOUNTER — Encounter: Payer: Self-pay | Admitting: Family Medicine

## 2014-09-16 VITALS — BP 134/56 | HR 71 | Temp 98.0°F | Ht 61.0 in | Wt 190.2 lb

## 2014-09-16 DIAGNOSIS — M25561 Pain in right knee: Secondary | ICD-10-CM

## 2014-09-16 DIAGNOSIS — E11311 Type 2 diabetes mellitus with unspecified diabetic retinopathy with macular edema: Secondary | ICD-10-CM

## 2014-09-16 DIAGNOSIS — M25511 Pain in right shoulder: Secondary | ICD-10-CM

## 2014-09-16 DIAGNOSIS — E119 Type 2 diabetes mellitus without complications: Secondary | ICD-10-CM

## 2014-09-16 DIAGNOSIS — E1165 Type 2 diabetes mellitus with hyperglycemia: Secondary | ICD-10-CM

## 2014-09-16 DIAGNOSIS — I10 Essential (primary) hypertension: Secondary | ICD-10-CM

## 2014-09-16 DIAGNOSIS — G8929 Other chronic pain: Secondary | ICD-10-CM

## 2014-09-16 DIAGNOSIS — Z794 Long term (current) use of insulin: Secondary | ICD-10-CM

## 2014-09-16 DIAGNOSIS — I252 Old myocardial infarction: Secondary | ICD-10-CM

## 2014-09-16 LAB — POCT GLYCOSYLATED HEMOGLOBIN (HGB A1C): Hemoglobin A1C: 6.7

## 2014-09-16 MED ORDER — AMLODIPINE BESYLATE 5 MG PO TABS: 5.0000 mg | ORAL_TABLET | Freq: Every day | ORAL | Status: DC

## 2014-09-16 MED ORDER — HYDROCODONE-ACETAMINOPHEN 5-325 MG PO TABS: 1.0000 | ORAL_TABLET | Freq: Two times a day (BID) | ORAL | Status: DC | PRN

## 2014-09-16 MED ORDER — GLIPIZIDE ER 5 MG PO TB24: 5.0000 mg | ORAL_TABLET | Freq: Every day | ORAL | Status: DC

## 2014-09-16 MED ORDER — METOPROLOL SUCCINATE ER 25 MG PO TB24: 12.5000 mg | ORAL_TABLET | Freq: Every day | ORAL | Status: DC

## 2014-09-16 MED ORDER — PRAVASTATIN SODIUM 40 MG PO TABS: 40.0000 mg | ORAL_TABLET | Freq: Every day | ORAL | Status: DC

## 2014-09-16 NOTE — Patient Instructions (Signed)
It was a please seeing you today in our clinic. Today we discussed shoulder pain and medications. Here is the treatment plan we have discussed and agreed upon together:   - continue medication regimen at prescribed. - Switched metoprolol to extended release. Same dosage - switched Crestor to new generic statin -- please come in in 2 weeks to have lipid panel (blood) drawn.

## 2014-09-16 NOTE — Progress Notes (Signed)
Patient ID: Kristi [name], female   DOB: July 17, 1941, 73 y.o.   MRN: [id number]  Georges Lynch, MD, MS Phone: [phone]  Subjective:  Chief complaint  Pt Here for diabetes followup Patient is a very pleasant 73 year old female who presents today for diabetes followup. She states she has been doing well she has not experienced any hypoglycemic events recently. She states good activity in her daily life. No issues with her diabetes at this time. Patient is reporting significant shooting/electric pain in her right shoulder primarily with motion. Upon examination pain is localized posteriorly and medially (approximately 3 cm) from the glenoid. Pain could be referred pain from osteoarthritis of the glenohumeral joint. However due to weakness with external rotation I cannot rule out suprascapular nerve impingement. Patient also stated that she is having a difficult time pain for her Crestor. She says it is making things difficult to make ends meet and she is asking if there are any alternatives which would provide a less expensive medication.  Review of Systems  Constitutional: Negative for fever, chills, weight loss and malaise/fatigue.  Eyes: Negative for blurred vision.  Respiratory: Negative for cough, shortness of breath and wheezing.   Cardiovascular: Negative for chest pain.  Gastrointestinal: Negative for nausea, vomiting and diarrhea.  Musculoskeletal: Positive for joint pain.  Neurological: Negative for weakness and headaches.     Past Medical History Patient Active Problem List   Diagnosis Date Noted  . Chronic right shoulder pain 09/16/2014  . Bradycardia 05/22/2014  . Suspicious nevus 05/22/2014  . Coronary atherosclerosis of native coronary artery 04/06/2014  . Old myocardial infarction 04/06/2014  . Gout 04/01/2014  . Tooth pain 10/31/2013  . Chronic back pain 07/21/2013  . 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 CHF (congestive heart failure) 11/01/2012  . Diabetic  retinopathy associated with type 2 diabetes mellitus 09/29/2012  . Osteoporosis 09/11/2012  . Depression 11/18/2011  . Lower extremity edema 09/20/2011  . Obesity 09/20/2011  . TOBACCO ABUSE 01/11/2011  . MONOCLONAL GAMMOPATHY 07/02/2010  . Hypercalcemia 03/29/2010  . GERD 03/26/2010  . Chronic kidney disease, stage IV (severe) 03/26/2010  . Insulin dependent type 2 diabetes mellitus, controlled 02/19/2010  . HYPERLIPIDEMIA 02/19/2010  . ESSENTIAL HYPERTENSION 02/19/2010  . COPD 02/19/2010    Medications- reviewed and updated Current Outpatient Prescriptions  Medication Sig Dispense Refill  . acetaminophen (TYLENOL) 500 MG tablet Take 1,000 mg by mouth every 6 (six) hours as needed.      Marland Kitchen acetaminophen-codeine (TYLENOL #3) 300-30 MG per tablet 1 tablet every 6 (six) hours as needed for moderate pain.       Marland Kitchen albuterol (PROVENTIL HFA) 108 (90 BASE) MCG/ACT inhaler Inhale 2 puffs into the lungs every 4 (four) hours as needed. For wheezing  8.5 g  2  . allopurinol (ZYLOPRIM) 100 MG tablet Take 2 tablets (200 mg total) by mouth daily.  60 tablet  1  . amLODipine (NORVASC) 5 MG tablet Take 1 tablet (5 mg total) by mouth daily.  90 tablet  3  . aspirin 325 MG tablet Take 325 mg by mouth at bedtime.       . [facility] ALLERGY RELIEF 180 MG tablet TAKE 1 TABLET BY MOUTH EVERY DAY  90 tablet  3  . glipiZIDE (GLUCOTROL XL) 5 MG 24 hr tablet Take 1 tablet (5 mg total) by mouth daily with breakfast.  30 tablet  11  . glucosamine-chondroitin 500-400 MG tablet Take 1 tablet by mouth 2 (two) times daily.        Marland Kitchen  glucose blood (ONE TOUCH ULTRA TEST) test strip Use as instructed  100 each  3  . HYDROcodone-acetaminophen (NORCO/VICODIN) 5-325 MG per tablet Take 1 tablet by mouth every 12 (twelve) hours as needed for moderate pain.  60 tablet  0  . insulin glargine (LANTUS) 100 UNIT/ML injection Inject 0.13 mLs (13 Units total) into the skin every morning. If blood sugar is >140 add 1 unit. If blood sugar is  <140 subtract 1 unit.  10 mL  2  . Insulin Pen Needle (B-D ULTRAFINE III SHORT PEN) 31G X 8 MM MISC USE AS DIRECTED 2 TIMES A DAY  100 each  1  . losartan (COZAAR) 25 MG tablet TAKE 1 TABLET BY MOUTH EVERY DAY  30 tablet  1  . metoprolol succinate (TOPROL-XL) 25 MG 24 hr tablet Take 0.5 tablets (12.5 mg total) by mouth daily.  90 tablet  3  . metoprolol tartrate (LOPRESSOR) 25 MG tablet Take 0.5 tablets (12.5 mg total) by mouth at bedtime.  90 tablet  3  . nitroGLYCERIN (NITROSTAT) 0.4 MG SL tablet Place 0.4 mg under the tongue every 5 (five) minutes as needed. For chest pain     Call 911 if pain not relieved by 3rd tab      . Omega-3 Fatty Acids (FISH OIL) 1200 MG CAPS Take 2 capsules by mouth 2 (two) times daily.      Marland Kitchen omeprazole (PRILOSEC) 20 MG capsule TAKE ONE CAPSULE BY MOUTH AT BEDTIME  90 capsule  3  . oxyCODONE-acetaminophen (PERCOCET/ROXICET) 5-325 MG per tablet Take 1 tablet by mouth 3 (three) times daily as needed for severe pain.      Vladimir Faster Glycol-Propyl Glycol (SYSTANE OP) Apply 1-2 drops to eye 4 (four) times daily as needed. For dry eyes      . pravastatin (PRAVACHOL) 40 MG tablet Take 1 tablet (40 mg total) by mouth daily.  90 tablet  3  . rosuvastatin (CRESTOR) 20 MG tablet TAKE 1 TABLET BY MOUTH AT BEDTIME  30 tablet  6  . Tiotropium Bromide Monohydrate (SPIRIVA RESPIMAT) 2.5 MCG/ACT AERS Inhale 2 puffs once daily  1 Inhaler  11  . vitamin B-12 (CYANOCOBALAMIN) 1000 MCG tablet Take 1,000 mcg by mouth every morning.        No current facility-administered medications for this visit.    Objective: BP 134/56  Pulse 71  Temp(Src) 98 F (36.7 C) (Oral)  Ht 5\' 1"  (1.549 m)  Wt 190 lb 3.2 oz (86.274 kg)  BMI 35.96 kg/m2 Gen: NAD, alert, cooperative with exam HEENT: NCAT, EOMI, PERRL CV: RRR, good S1/S2, no murmur Resp: CTABL, no wheezes, non-labored Abd: Soft, Non Tender, Non Distended, BS present, no guarding or organomegaly Ext: No edema, warm Neuro: Alert and  oriented, No gross deficits   Assessment/Plan:  Insulin dependent type 2 diabetes mellitus, controlled Patient has no complaints about her recent glucose control. A1c ordered and found to be 6.7. - Will make no adjustments at this time.  Old myocardial infarction Patient presented on Crestor 20 mg daily. Patient states that this medication is extremely expensive and she is having a hard time making ends meet while on it. She is asking for a cheaper alternative. - At this time I discontinued her Crestor to 20 mg daily - I have written for Pravastatin 40 mg daily do to the decrease cost of this medication. - I have ordered for a fasting lipid panel to be obtained in 2 weeks  Chronic right shoulder  pain Patient is reporting significant shooting/electric pain in her right shoulder primarily with motion. Upon examination pain is localized posteriorly and medially (approximately 3 cm) from the glenoid. Pain could be referred pain from osteoarthritis of the glenohumeral joint. However due to weakness with external rotation I cannot rule out suprascapular nerve impingement. - Patient reports that all joint pain she experiences throughout her body is worse with changing of weather. Recently we've experienced significant changes and are weather and because of this she is been extremely uncomfortable. - Patient had been receiving Norco 5/325 for this pain. She has not required any refills in over 5 months. Because of this I feel comfortable in reordering this prescription for her. - She and I discussed proper use of this medication and we both agreed that narcotics are to be used not seen on the pain but to make it bearable each day.    Orders Placed This Encounter  Procedures  . Lipid Panel    Standing Status: Future     Number of Occurrences:      Standing Expiration Date: 09/17/2015    Order Specific Question:  Has the patient fasted?    Answer:  Yes  . POCT HgB A1C    Meds ordered this  encounter  Medications  . amLODipine (NORVASC) 5 MG tablet    Sig: Take 1 tablet (5 mg total) by mouth daily.    Dispense:  90 tablet    Refill:  3  . glipiZIDE (GLUCOTROL XL) 5 MG 24 hr tablet    Sig: Take 1 tablet (5 mg total) by mouth daily with breakfast.    Dispense:  30 tablet    Refill:  11  . metoprolol succinate (TOPROL-XL) 25 MG 24 hr tablet    Sig: Take 0.5 tablets (12.5 mg total) by mouth daily.    Dispense:  90 tablet    Refill:  3  . HYDROcodone-acetaminophen (NORCO/VICODIN) 5-325 MG per tablet    Sig: Take 1 tablet by mouth every 12 (twelve) hours as needed for moderate pain.    Dispense:  60 tablet    Refill:  0  . pravastatin (PRAVACHOL) 40 MG tablet    Sig: Take 1 tablet (40 mg total) by mouth daily.    Dispense:  90 tablet    Refill:  3

## 2014-09-16 NOTE — Assessment & Plan Note (Signed)
Patient presented on Crestor 20 mg daily. Patient states that this medication is extremely expensive and she is having a hard time making ends meet while on it. She is asking for a cheaper alternative. - At this time I discontinued her Crestor to 20 mg daily - I have written for Pravastatin 40 mg daily do to the decrease cost of this medication. - I have ordered for a fasting lipid panel to be obtained in 2 weeks

## 2014-09-16 NOTE — Assessment & Plan Note (Signed)
Patient is reporting significant shooting/electric pain in her right shoulder primarily with motion. Upon examination pain is localized posteriorly and medially (approximately 3 cm) from the glenoid. Pain could be referred pain from osteoarthritis of the glenohumeral joint. However due to weakness with external rotation I cannot rule out suprascapular nerve impingement. - Patient reports that all joint pain she experiences throughout her body is worse with changing of weather. Recently we've experienced significant changes and are weather and because of this she is been extremely uncomfortable. - Patient had been receiving Norco 5/325 for this pain. She has not required any refills in over 5 months. Because of this I feel comfortable in reordering this prescription for her. - She and I discussed proper use of this medication and we both agreed that narcotics are to be used not seen on the pain but to make it bearable each day.

## 2014-09-16 NOTE — Assessment & Plan Note (Signed)
Patient has no complaints about her recent glucose control. A1c ordered and found to be 6.7. - Will make no adjustments at this time.

## 2014-09-30 ENCOUNTER — Other Ambulatory Visit: Payer: Commercial Managed Care - HMO

## 2014-09-30 DIAGNOSIS — I10 Essential (primary) hypertension: Secondary | ICD-10-CM

## 2014-09-30 LAB — LIPID PANEL
CHOLESTEROL: 157 mg/dL (ref 0–200)
HDL: 32 mg/dL — ABNORMAL LOW (ref >39)
LDL Cholesterol: 92 mg/dL (ref 0–99)
TRIGLYCERIDES: 166 mg/dL — AB (ref <150)
Total CHOL/HDL Ratio: 4.9 Ratio
VLDL: 33 mg/dL (ref 0–40)

## 2014-09-30 NOTE — Progress Notes (Signed)
FLP DONE TODAY Kristi [name] 

## 2014-10-28 ENCOUNTER — Encounter (HOSPITAL_COMMUNITY): Payer: Self-pay | Admitting: Family Medicine

## 2014-10-28 ENCOUNTER — Inpatient Hospital Stay (HOSPITAL_COMMUNITY)
Admission: EM | Admit: 2014-10-28 | Discharge: 2014-11-03 | DRG: 292 | Disposition: A | Discharge status: Home Health | Payer: Medicare HMO | Attending: Family Medicine | Admitting: Family Medicine

## 2014-10-28 ENCOUNTER — Emergency Department (INDEPENDENT_AMBULATORY_CARE_PROVIDER_SITE_OTHER)
Admission: EM | Admit: 2014-10-28 | Discharge: 2014-10-28 | Disposition: A | Discharge status: Nursing Home (Includes ICF) | Payer: Medicare HMO | Source: Home / Self Care | Attending: Family Medicine | Admitting: Family Medicine

## 2014-10-28 ENCOUNTER — Encounter (HOSPITAL_COMMUNITY): Payer: Self-pay | Admitting: Emergency Medicine

## 2014-10-28 ENCOUNTER — Emergency Department (HOSPITAL_COMMUNITY): Payer: Medicare HMO

## 2014-10-28 DIAGNOSIS — K219 Gastro-esophageal reflux disease without esophagitis: Secondary | ICD-10-CM | POA: Diagnosis present

## 2014-10-28 DIAGNOSIS — I129 Hypertensive chronic kidney disease with stage 1 through stage 4 chronic kidney disease, or unspecified chronic kidney disease: Secondary | ICD-10-CM | POA: Diagnosis present

## 2014-10-28 DIAGNOSIS — E11319 Type 2 diabetes mellitus with unspecified diabetic retinopathy without macular edema: Secondary | ICD-10-CM | POA: Diagnosis present

## 2014-10-28 DIAGNOSIS — I2510 Atherosclerotic heart disease of native coronary artery without angina pectoris: Secondary | ICD-10-CM | POA: Diagnosis present

## 2014-10-28 DIAGNOSIS — Z794 Long term (current) use of insulin: Secondary | ICD-10-CM

## 2014-10-28 DIAGNOSIS — R059 Cough, unspecified: Secondary | ICD-10-CM

## 2014-10-28 DIAGNOSIS — Z7982 Long term (current) use of aspirin: Secondary | ICD-10-CM | POA: Diagnosis not present

## 2014-10-28 DIAGNOSIS — I5033 Acute on chronic diastolic (congestive) heart failure: Secondary | ICD-10-CM | POA: Diagnosis present

## 2014-10-28 DIAGNOSIS — Z9111 Patient's noncompliance with dietary regimen: Secondary | ICD-10-CM | POA: Diagnosis present

## 2014-10-28 DIAGNOSIS — R0602 Shortness of breath: Secondary | ICD-10-CM

## 2014-10-28 DIAGNOSIS — J441 Chronic obstructive pulmonary disease with (acute) exacerbation: Secondary | ICD-10-CM | POA: Diagnosis present

## 2014-10-28 DIAGNOSIS — Z86718 Personal history of other venous thrombosis and embolism: Secondary | ICD-10-CM

## 2014-10-28 DIAGNOSIS — I440 Atrioventricular block, first degree: Secondary | ICD-10-CM | POA: Diagnosis present

## 2014-10-28 DIAGNOSIS — Z79899 Other long term (current) drug therapy: Secondary | ICD-10-CM

## 2014-10-28 DIAGNOSIS — E8779 Other fluid overload: Secondary | ICD-10-CM

## 2014-10-28 DIAGNOSIS — Z598 Other problems related to housing and economic circumstances: Secondary | ICD-10-CM | POA: Diagnosis not present

## 2014-10-28 DIAGNOSIS — E1122 Type 2 diabetes mellitus with diabetic chronic kidney disease: Secondary | ICD-10-CM | POA: Diagnosis present

## 2014-10-28 DIAGNOSIS — I252 Old myocardial infarction: Secondary | ICD-10-CM

## 2014-10-28 DIAGNOSIS — D631 Anemia in chronic kidney disease: Secondary | ICD-10-CM | POA: Diagnosis present

## 2014-10-28 DIAGNOSIS — Z8673 Personal history of transient ischemic attack (TIA), and cerebral infarction without residual deficits: Secondary | ICD-10-CM

## 2014-10-28 DIAGNOSIS — R05 Cough: Secondary | ICD-10-CM

## 2014-10-28 DIAGNOSIS — I272 Other secondary pulmonary hypertension: Secondary | ICD-10-CM | POA: Diagnosis present

## 2014-10-28 DIAGNOSIS — Z9119 Patient's noncompliance with other medical treatment and regimen: Secondary | ICD-10-CM | POA: Diagnosis present

## 2014-10-28 DIAGNOSIS — R339 Retention of urine, unspecified: Secondary | ICD-10-CM | POA: Diagnosis present

## 2014-10-28 DIAGNOSIS — I2721 Secondary pulmonary arterial hypertension: Secondary | ICD-10-CM

## 2014-10-28 DIAGNOSIS — I270 Primary pulmonary hypertension: Secondary | ICD-10-CM

## 2014-10-28 DIAGNOSIS — E785 Hyperlipidemia, unspecified: Secondary | ICD-10-CM | POA: Diagnosis present

## 2014-10-28 DIAGNOSIS — M810 Age-related osteoporosis without current pathological fracture: Secondary | ICD-10-CM | POA: Diagnosis present

## 2014-10-28 DIAGNOSIS — N184 Chronic kidney disease, stage 4 (severe): Secondary | ICD-10-CM | POA: Diagnosis present

## 2014-10-28 DIAGNOSIS — N186 End stage renal disease: Secondary | ICD-10-CM

## 2014-10-28 DIAGNOSIS — E8770 Fluid overload, unspecified: Secondary | ICD-10-CM | POA: Diagnosis present

## 2014-10-28 DIAGNOSIS — R0609 Other forms of dyspnea: Secondary | ICD-10-CM | POA: Diagnosis present

## 2014-10-28 DIAGNOSIS — E118 Type 2 diabetes mellitus with unspecified complications: Secondary | ICD-10-CM | POA: Insufficient documentation

## 2014-10-28 DIAGNOSIS — Z87891 Personal history of nicotine dependence: Secondary | ICD-10-CM | POA: Diagnosis not present

## 2014-10-28 DIAGNOSIS — I10 Essential (primary) hypertension: Secondary | ICD-10-CM | POA: Insufficient documentation

## 2014-10-28 DIAGNOSIS — I509 Heart failure, unspecified: Secondary | ICD-10-CM | POA: Insufficient documentation

## 2014-10-28 DIAGNOSIS — J449 Chronic obstructive pulmonary disease, unspecified: Secondary | ICD-10-CM | POA: Insufficient documentation

## 2014-10-28 DIAGNOSIS — E1165 Type 2 diabetes mellitus with hyperglycemia: Secondary | ICD-10-CM | POA: Diagnosis present

## 2014-10-28 DIAGNOSIS — R079 Chest pain, unspecified: Secondary | ICD-10-CM

## 2014-10-28 HISTORY — DX: Secondary pulmonary arterial hypertension: I27.21

## 2014-10-28 LAB — BASIC METABOLIC PANEL
ANION GAP: 20 — AB (ref 5–15)
BUN: 50 mg/dL — AB (ref 6–23)
CALCIUM: 9.9 mg/dL (ref 8.4–10.5)
CO2: 18 mEq/L — ABNORMAL LOW (ref 19–32)
CREATININE: 2.79 mg/dL — AB (ref 0.50–1.10)
Chloride: 106 mEq/L (ref 96–112)
GFR calc Af Amer: 18 mL/min — ABNORMAL LOW (ref >90)
GFR calc non Af Amer: 16 mL/min — ABNORMAL LOW (ref >90)
Glucose, Bld: 107 mg/dL — ABNORMAL HIGH (ref 70–99)
Potassium: 4.2 mEq/L (ref 3.7–5.3)
Sodium: 144 mEq/L (ref 137–147)

## 2014-10-28 LAB — URINALYSIS, ROUTINE W REFLEX MICROSCOPIC
BILIRUBIN URINE: NEGATIVE
GLUCOSE, UA: NEGATIVE mg/dL
Hgb urine dipstick: NEGATIVE
Ketones, ur: NEGATIVE mg/dL
Nitrite: NEGATIVE
Protein, ur: 30 mg/dL — AB
Specific Gravity, Urine: 1.009 (ref 1.005–1.030)
Urobilinogen, UA: 0.2 mg/dL (ref 0.0–1.0)
pH: 5 (ref 5.0–8.0)

## 2014-10-28 LAB — TROPONIN I
Troponin I: <0.3 ng/mL (ref <0.30)
Troponin I: <0.3 ng/mL (ref <0.30)

## 2014-10-28 LAB — CBC
HEMATOCRIT: 33.4 % — AB (ref 36.0–46.0)
Hemoglobin: 10.6 g/dL — ABNORMAL LOW (ref 12.0–15.0)
MCH: 29.4 pg (ref 26.0–34.0)
MCHC: 31.7 g/dL (ref 30.0–36.0)
MCV: 92.8 fL (ref 78.0–100.0)
PLATELETS: 207 10*3/uL (ref 150–400)
RBC: 3.6 MIL/uL — ABNORMAL LOW (ref 3.87–5.11)
RDW: 17.4 % — AB (ref 11.5–15.5)
WBC: 7.3 10*3/uL (ref 4.0–10.5)

## 2014-10-28 LAB — GLUCOSE, CAPILLARY: Glucose-Capillary: 143 mg/dL — ABNORMAL HIGH (ref 70–99)

## 2014-10-28 LAB — URINE MICROSCOPIC-ADD ON

## 2014-10-28 LAB — MAGNESIUM: Magnesium: 2.6 mg/dL — ABNORMAL HIGH (ref 1.5–2.5)

## 2014-10-28 LAB — MRSA PCR SCREENING: MRSA BY PCR: NEGATIVE

## 2014-10-28 LAB — I-STAT TROPONIN, ED: Troponin i, poc: 0.01 ng/mL (ref 0.00–0.08)

## 2014-10-28 LAB — PRO B NATRIURETIC PEPTIDE: Pro B Natriuretic peptide (BNP): 12267 pg/mL — ABNORMAL HIGH (ref 0–125)

## 2014-10-28 MED ORDER — ALLOPURINOL 100 MG PO TABS
200.0000 mg | ORAL_TABLET | Freq: Every day | ORAL | Status: DC
  Administered 2014-10-29 – 2014-11-03 (×6): 200 mg via ORAL
  Filled 2014-10-28 (×7): qty 2

## 2014-10-28 MED ORDER — SODIUM CHLORIDE 0.9 % IJ SOLN
3.0000 mL | Freq: Two times a day (BID) | INTRAMUSCULAR | Status: DC
  Administered 2014-10-28 – 2014-11-03 (×12): 3 mL via INTRAVENOUS

## 2014-10-28 MED ORDER — PRAVASTATIN SODIUM 40 MG PO TABS
40.0000 mg | ORAL_TABLET | Freq: Every evening | ORAL | Status: DC
  Administered 2014-10-28 – 2014-10-29 (×2): 40 mg via ORAL
  Filled 2014-10-28 (×2): qty 1

## 2014-10-28 MED ORDER — HEPARIN SODIUM (PORCINE) 5000 UNIT/ML IJ SOLN
5000.0000 [IU] | Freq: Three times a day (TID) | INTRAMUSCULAR | Status: DC
  Administered 2014-10-28 – 2014-11-03 (×17): 5000 [IU] via SUBCUTANEOUS
  Filled 2014-10-28 (×20): qty 1

## 2014-10-28 MED ORDER — FUROSEMIDE 10 MG/ML IJ SOLN
80.0000 mg | Freq: Two times a day (BID) | INTRAMUSCULAR | Status: DC
Start: 2014-10-28 — End: 2014-10-30
  Administered 2014-10-28 – 2014-10-30 (×4): 80 mg via INTRAVENOUS
  Filled 2014-10-28 (×6): qty 8

## 2014-10-28 MED ORDER — METOPROLOL SUCCINATE 12.5 MG HALF TABLET
12.5000 mg | ORAL_TABLET | Freq: Every day | ORAL | Status: DC
  Administered 2014-10-29 – 2014-11-03 (×6): 12.5 mg via ORAL
  Filled 2014-10-28 (×6): qty 1

## 2014-10-28 MED ORDER — AMLODIPINE BESYLATE 5 MG PO TABS
5.0000 mg | ORAL_TABLET | Freq: Every day | ORAL | Status: DC
  Administered 2014-10-29 – 2014-11-03 (×6): 5 mg via ORAL
  Filled 2014-10-28 (×7): qty 1

## 2014-10-28 MED ORDER — IPRATROPIUM-ALBUTEROL 0.5-2.5 (3) MG/3ML IN SOLN
3.0000 mL | Freq: Four times a day (QID) | RESPIRATORY_TRACT | Status: DC
  Administered 2014-10-28 – 2014-10-29 (×5): 3 mL via RESPIRATORY_TRACT
  Filled 2014-10-28 (×5): qty 3

## 2014-10-28 MED ORDER — OMEGA-3-ACID ETHYL ESTERS 1 G PO CAPS
1.0000 g | ORAL_CAPSULE | Freq: Every day | ORAL | Status: DC
  Administered 2014-10-29 – 2014-11-03 (×6): 1 g via ORAL
  Filled 2014-10-28 (×6): qty 1

## 2014-10-28 MED ORDER — IPRATROPIUM-ALBUTEROL 0.5-2.5 (3) MG/3ML IN SOLN
3.0000 mL | Freq: Once | RESPIRATORY_TRACT | Status: AC
  Administered 2014-10-28: 3 mL via RESPIRATORY_TRACT

## 2014-10-28 MED ORDER — PANTOPRAZOLE SODIUM 40 MG PO TBEC
40.0000 mg | DELAYED_RELEASE_TABLET | Freq: Every day | ORAL | Status: DC
  Administered 2014-10-28 – 2014-11-03 (×7): 40 mg via ORAL
  Filled 2014-10-28 (×7): qty 1

## 2014-10-28 MED ORDER — IPRATROPIUM-ALBUTEROL 0.5-2.5 (3) MG/3ML IN SOLN
RESPIRATORY_TRACT | Status: AC
  Filled 2014-10-28: qty 3

## 2014-10-28 MED ORDER — INSULIN ASPART 100 UNIT/ML ~~LOC~~ SOLN: 0.0000 [IU] | Freq: Every day | SUBCUTANEOUS | Status: DC

## 2014-10-28 MED ORDER — FUROSEMIDE 10 MG/ML IJ SOLN: 20.0000 mg | Freq: Once | INTRAMUSCULAR | Status: DC

## 2014-10-28 MED ORDER — ACETAMINOPHEN 500 MG PO TABS
1000.0000 mg | ORAL_TABLET | Freq: Four times a day (QID) | ORAL | Status: DC | PRN
  Administered 2014-10-30 – 2014-10-31 (×2): 1000 mg via ORAL
  Filled 2014-10-28 (×2): qty 2

## 2014-10-28 MED ORDER — INSULIN GLARGINE 100 UNIT/ML ~~LOC~~ SOLN
13.0000 [IU] | Freq: Every day | SUBCUTANEOUS | Status: DC
  Administered 2014-10-28 – 2014-11-02 (×6): 13 [IU] via SUBCUTANEOUS
  Filled 2014-10-28 (×7): qty 0.13

## 2014-10-28 MED ORDER — ASPIRIN 325 MG PO TABS
325.0000 mg | ORAL_TABLET | Freq: Every day | ORAL | Status: DC
  Administered 2014-10-28 – 2014-10-31 (×4): 325 mg via ORAL
  Filled 2014-10-28 (×6): qty 1

## 2014-10-28 MED ORDER — CETYLPYRIDINIUM CHLORIDE 0.05 % MT LIQD
7.0000 mL | Freq: Two times a day (BID) | OROMUCOSAL | Status: DC
  Administered 2014-10-29 – 2014-11-03 (×11): 7 mL via OROMUCOSAL

## 2014-10-28 MED ORDER — FUROSEMIDE 10 MG/ML IJ SOLN
40.0000 mg | Freq: Once | INTRAMUSCULAR | Status: AC
  Administered 2014-10-28: 40 mg via INTRAVENOUS
  Filled 2014-10-28: qty 4

## 2014-10-28 MED ORDER — OXYCODONE-ACETAMINOPHEN 5-325 MG PO TABS
1.0000 | ORAL_TABLET | Freq: Three times a day (TID) | ORAL | Status: DC | PRN
  Administered 2014-10-30: 1 via ORAL
  Filled 2014-10-28: qty 1

## 2014-10-28 MED ORDER — NITROGLYCERIN 2 % TD OINT
1.0000 [in_us] | TOPICAL_OINTMENT | Freq: Once | TRANSDERMAL | Status: AC
  Administered 2014-10-28: 1 [in_us] via TOPICAL
  Filled 2014-10-28: qty 1

## 2014-10-28 NOTE — Progress Notes (Signed)
BIPAP on stand-by at bedside 

## 2014-10-28 NOTE — Consult Note (Signed)
Cardiologist:  Irish Lack Reason for Consult: CHF Referring Physician:    KALLEN [name] is an 73 y.o. female.  HPI:    The patient is a 73 yo female with a history of HLD, CAD, tobacco abuse(Quit April 2015), HTN, COPD, DM, CKD, GERD, Chronic CHF, DVT, CVA.  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   Her last 2D echo was 10/2012 and her EF was 55-60%, mild MR, RA mod dilation, Peak PA pressure 29mHg.  She presents with dyspnea on exertion which has been getting progressively worse for the last two weeks.  Her weight is going up and she has LEE but he does not have any orthpnea or PND.  She currently denies nausea, vomiting, fever, chest pain, shortness of breath at rest, dizziness, PND, cough, congestion, abdominal pain, hematochezia, melena, claudication.   Past Medical History  Diagnosis Date  . Hyperlipidemia   . Hyperparathyroidism   . CAD (coronary artery disease)   . Myocardial infarction 2008  . Hypertension   . COPD (chronic obstructive pulmonary disease)   . Diabetes mellitus     diagnosed 2010 - takes oral meds and lantus insulin  . Anemia   . Chronic kidney disease     esrd - since 2010  . GERD (gastroesophageal reflux disease)     on omeprazole  . Arthritis     all over  . CHF (congestive heart failure) 08-2011  . CVA 02/19/2010  . CAD 02/19/2010  . DEEP VENOUS THROMBOPHLEBITIS, LEG, LEFT 03/08/2010    in 2009 s/p hospitalization- only 1 isolated dvt  . Stroke ?2010    found remote CVA on CT scan    Past Surgical History  Procedure Laterality Date  . Av fistula placement  04/05/11    Left Radiocephalic AVF  . Appendectomy  1969  . Tubal ligation    . Revision of fisturl    . Coronary angioplasty with stent placement  06/23/2007    by Dr. VIrish Lackis her cardiologist  . Ligation goretex fistula  10/29/2011    Procedure: LIGATION GORE-TEX FISTULA;  Surgeon: CElam Dutch MD;  [facility];  Service: Vascular;  Laterality: Left;   Ligation of competing branches left forearm fistula    Family History  Problem Relation Age of Onset  . Cancer Mother     multiple myloma  . Dementia Father   . Pneumonia Father   . Anuerysm Father     Social History:  reports that she quit smoking about 6 months ago. Her smoking use included Cigarettes. She started smoking about 53 years ago. She has a 26.5 pack-year smoking history. She has never used smokeless tobacco. She reports that she does not drink alcohol or use illicit drugs.  Allergies:  Allergies  Allergen Reactions  . Losartan Other (See Comments)    Causes increase in creatinine and worsening CKD  . Varenicline Nausea Only and Other (See Comments)    Dizzy and GI symptoms     Medications:  Scheduled Meds: . [START ON 10/29/2014] allopurinol  200 mg Oral Daily  . [START ON 10/29/2014] amLODipine  5 mg Oral Daily  . antiseptic oral rinse  7 mL Mouth Rinse BID  . aspirin  325 mg Oral QHS  . furosemide  80 mg Intravenous BID  . heparin  5,000 Units Subcutaneous 3 times per day  . [START ON 10/29/2014] metoprolol succinate  12.5 mg Oral Daily  . [START ON 10/29/2014] omega-3 acid  ethyl esters  1 g Oral Daily  . pantoprazole  40 mg Oral Daily  . pravastatin  40 mg Oral QPM  . sodium chloride  3 mL Intravenous Q12H   Continuous Infusions:  PRN Meds:.acetaminophen, oxyCODONE-acetaminophen   Results for orders placed or performed during the hospital encounter of 10/28/14 (from the past 48 hour(s))  CBC     Status: Abnormal   Collection Time: 10/28/14 12:55 PM  Result Value Ref Range   WBC 7.3 4.0 - 10.5 K/uL   RBC 3.60 (L) 3.87 - 5.11 MIL/uL   Hemoglobin 10.6 (L) 12.0 - 15.0 g/dL   HCT 33.4 (L) 36.0 - 46.0 %   MCV 92.8 78.0 - 100.0 fL   MCH 29.4 26.0 - 34.0 pg   MCHC 31.7 30.0 - 36.0 g/dL   RDW 17.4 (H) 11.5 - 15.5 %   Platelets 207 150 - 400 K/uL  Basic metabolic panel     Status: Abnormal   Collection Time: 10/28/14 12:55 PM  Result Value Ref Range    Sodium 144 137 - 147 mEq/L   Potassium 4.2 3.7 - 5.3 mEq/L   Chloride 106 96 - 112 mEq/L   CO2 18 (L) 19 - 32 mEq/L   Glucose, Bld 107 (H) 70 - 99 mg/dL   BUN 50 (H) 6 - 23 mg/dL   Creatinine, Ser 2.79 (H) 0.50 - 1.10 mg/dL   Calcium 9.9 8.4 - 10.5 mg/dL   GFR calc non Af Amer 16 (L) >90 mL/min   GFR calc Af Amer 18 (L) >90 mL/min    Comment: (NOTE) The eGFR has been calculated using the CKD EPI equation. This calculation has not been validated in all clinical situations. eGFR's persistently <90 mL/min signify possible Chronic Kidney Disease.    Anion gap 20 (H) 5 - 15  BNP (order ONLY if patient complains of dyspnea/SOB AND you have documented it for THIS visit)     Status: Abnormal   Collection Time: 10/28/14 12:55 PM  Result Value Ref Range   Pro B Natriuretic peptide (BNP) 12267.0 (H) 0 - 125 pg/mL  I-stat troponin, ED (not at [facility])     Status: None   Collection Time: 10/28/14  1:01 PM  Result Value Ref Range   Troponin i, poc 0.01 0.00 - 0.08 ng/mL   Comment 3            Comment: Due to the release kinetics of cTnI, a negative result within the first hours of the onset of symptoms does not rule out myocardial infarction with certainty. If myocardial infarction is still suspected, repeat the test at appropriate intervals.      Dg Chest 2 View  10/28/2014   CLINICAL DATA:  Shortness of breath and COPD. History of smoking. Chest pain.  EXAM: CHEST  2 VIEW  COMPARISON:  10/30/2012  FINDINGS: Coarse lung markings are similar to the previous examination. No focal airspace disease. Heart size is upper limits of normal but unchanged. Atherosclerotic calcifications at the aortic arch. No acute bone abnormality.  IMPRESSION: Stable chest radiograph findings.  No acute disease.   Electronically Signed   By: Markus Daft M.D.   On: 10/28/2014 13:29    Review of Systems  Constitutional: Negative for fever and diaphoresis.  HENT: Negative for congestion and sore throat.   Respiratory:  Positive for shortness of breath (With exertion). Negative for cough.   Cardiovascular: Positive for leg swelling. Negative for chest pain, orthopnea and PND.  Gastrointestinal: Negative  for nausea, vomiting, abdominal pain, blood in stool and melena.  Genitourinary: Negative for hematuria.  Musculoskeletal: Positive for myalgias (hip pain).  Neurological: Negative for dizziness and weakness.  All other systems reviewed and are negative.  Blood pressure 144/48, pulse 86, temperature 97.4 F (36.3 C), temperature source Oral, resp. rate 20, height 5' (1.524 m), weight 202 lb 9.6 oz (91.9 kg), SpO2 94 %. Physical Exam  Nursing note and vitals reviewed. Constitutional: She is oriented to person, place, and time. She appears well-developed. No distress.  Obese  HENT:  Head: Normocephalic and atraumatic.  Eyes: EOM are normal. Pupils are equal, round, and reactive to light. No scleral icterus.  Neck: Normal range of motion. Neck supple. No JVD present.  Cardiovascular: Normal rate, regular rhythm, S1 normal and S2 normal.   No murmur heard. Pulses:      Radial pulses are 2+ on the right side, and 2+ on the left side.       Dorsalis pedis pulses are 2+ on the right side, and 2+ on the left side.  Respiratory: Effort normal. She has no wheezes.  + Rhonchi  GI: Soft. Bowel sounds are normal. She exhibits no distension. There is no tenderness.  Dull to percussion bilaterally.   Musculoskeletal: She exhibits edema.  1-2+ pitting edema  Neurological: She is alert and oriented to person, place, and time. She exhibits normal muscle tone.  Skin: Skin is warm and dry.  Psychiatric: She has a normal mood and affect.    Assessment/Plan: Principal Problem:   Acute CHF exacerbation likely diastolic  Volume seems to been in her legs and ABD.   Noncompliant with diet.  A new echo has already ben ordered.   Lasix 66m IV BID ordered.      Severe Pulmonary HTN  Certainly can lead to right heart  failure.  Echo ordered.      CKD IV  Nephrology following   CAD  Angina free.  Troponin negative.    Anemia of chronic disease  Hgb 10.6  Will continue to follow.  HTarri Fuller PA-C 10/28/2014, 5:03 PM     I have examined the patient and reviewed assessment and plan and discussed with patient.  Agree with above as stated.  Volume overload.  Pulm HTN and diastolic dysfunction.  Check echo.  Diurese.  Watch renal function closely as she is close to needing dialysis.  May need to adjust lasix dose based on her response.  Her DOE is likely multifactorial including deconditioning, pulm HTN, and volume overload.   , S.

## 2014-10-28 NOTE — Progress Notes (Signed)
[facility] Admission History and Physical Service Pager: [phone]  Patient name: Kristi [name] Medical record number: [id number] Date of birth: Aug 31, 1941 Age: 73 y.o. Gender: female  Primary Care Provider: Alease Frame Marylynn Pearson, MD Consultants: Nephrology  Code Status: Full  Chief Complaint: Progressive dyspnea  Assessment and Plan: Kristi [name] is a 73 y.o. female presenting with volume overload . PMH is significant for CKD stage IV, diastolic CHF, HTN, T2 DM, COPD, hyperlipidemia.  Volume overload, dyspnea - Dyspnea likely multifactorial with volume overload seemingly the primary etiology, also concern for COPD exacerbation. Volume overload also multifactorial with CK D stage IV and diastolic CHF - Patient currently on BiPAP, admit to step down unit - Nephrology consulted, appreciate recommendations - IV Lasix 80 twice a day, catheter for strict I's and O's, daily weights - Chest x-ray without much fluid, proBNP elevated to 12,267 troponin negative 1, EKG with no discrete ischemic signs bu with first-degree heart block, slight right axis deviation, and T-wave inversion in V4 through V6 - With history of CHF (preserved EF), consider CHF exacerbation-cycle troponins and repeat EKG in the morning - Consider cardiology consult - Daily renal function panel, consider dialysis if unresponsive to Lasix  T2 DM - Well-controlled A1c 6.7 - Hold home glipizide - Sliding-scale insulin while admitted, restart Lantus when she's eating, usual dose 13 units daily  HTN - Reasonably well-controlled today - Continue amlodipine, metoprolol - Hold losartan with likely renal stress due to Lasix  COPD - Possibly contributing, no discrete wheezing on exam  - DuoNeb's - Consider a prednisone burst +/- antibiotics  Hyperlipidemia - Continue pravastatin  FEN/GI: Nothing by mouth while on BiPAP, saline lock IV Prophylaxis: Subcutaneous heparin  Disposition: Admit to step  down unit for BiPAP and IV diuresis  History of Present Illness: Kristi [name] is a 73 y.o. female presenting with progressive dyspnea for the last 2 weeks. She states that she came in today due to her dyspnea. She denies chest pain but states that she has some new right upper quadrant pain which is intermittent that developed today. She denies any increased cough but states that her dyspnea worsens on exertion. She states that she feels generally fatigued for the last 2 weeks as well but denies any fevers.  Review Of Systems: Per HPI, Otherwise 12 point review of systems was performed and was unremarkable.  Patient Active Problem List   Diagnosis Date Noted  . Volume overload 10/28/2014  . Chronic right shoulder pain 09/16/2014  . Bradycardia 05/22/2014  . Suspicious nevus 05/22/2014  . Coronary atherosclerosis of native coronary artery 04/06/2014  . Old myocardial infarction 04/06/2014  . Gout 04/01/2014  . Tooth pain 10/31/2013  . Chronic back pain 07/21/2013  . 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 CHF (congestive heart failure) 11/01/2012  . Diabetic retinopathy associated with type 2 diabetes mellitus 09/29/2012  . Osteoporosis 09/11/2012  . Depression 11/18/2011  . Lower extremity edema 09/20/2011  . Obesity 09/20/2011  . TOBACCO ABUSE 01/11/2011  . MONOCLONAL GAMMOPATHY 07/02/2010  . Hypercalcemia 03/29/2010  . GERD 03/26/2010  . Chronic kidney disease, stage IV (severe) 03/26/2010  . Insulin dependent type 2 diabetes mellitus, controlled 02/19/2010  . HYPERLIPIDEMIA 02/19/2010  . ESSENTIAL HYPERTENSION 02/19/2010  . COPD 02/19/2010   Past Medical History: Past Medical History  Diagnosis Date  . Hyperlipidemia   . Hyperparathyroidism   . CAD (coronary artery disease)   . Myocardial infarction 2008  . Hypertension   .  COPD (chronic obstructive pulmonary disease)   . Diabetes mellitus     diagnosed 2010 - takes oral meds and lantus insulin  . Anemia   . Chronic  kidney disease     esrd - since 2010  . GERD (gastroesophageal reflux disease)     on omeprazole  . Arthritis     all over  . CHF (congestive heart failure) 08-2011  . CVA 02/19/2010  . CAD 02/19/2010  . DEEP VENOUS THROMBOPHLEBITIS, LEG, LEFT 03/08/2010    in 2009 s/p hospitalization- only 1 isolated dvt  . Stroke ?2010    found remote CVA on CT scan   Past Surgical History: Past Surgical History  Procedure Laterality Date  . Av fistula placement  04/05/11    Left Radiocephalic AVF  . Appendectomy  1969  . Tubal ligation    . Revision of fisturl    . Coronary angioplasty with stent placement  06/23/2007    by Dr. Irish Lack is her cardiologist  . Ligation goretex fistula  10/29/2011    Procedure: LIGATION GORE-TEX FISTULA;  Surgeon: Elam Dutch, MD;  Location: [facility];  Service: Vascular;  Laterality: Left;  Ligation of competing branches left forearm fistula   Social History: History  Substance Use Topics  . Smoking status: Former Smoker -- 0.50 packs/day for 53 years    Types: Cigarettes    Start date: 12/09/1960    Quit date: 03/31/2014  . Smokeless tobacco: Never Used  . Alcohol Use: No   Additional social history: Please also refer to relevant sections of EMR.  Family History: Family History  Problem Relation Age of Onset  . Cancer Mother     multiple myloma  . Dementia Father   . Pneumonia Father   . Anuerysm Father    Allergies and Medications: Allergies  Allergen Reactions  . Losartan Other (See Comments)    Causes increase in creatinine and worsening CKD  . Varenicline Nausea Only and Other (See Comments)    Dizzy and GI symptoms    No current facility-administered medications on file prior to encounter.   Current Outpatient Prescriptions on File Prior to Encounter  Medication Sig Dispense Refill  . acetaminophen (TYLENOL) 500 MG tablet Take 1,000 mg by mouth every 6 (six) hours as needed.    Marland Kitchen acetaminophen-codeine (TYLENOL #3) 300-30 MG per tablet  1 tablet every 6 (six) hours as needed for moderate pain.     Marland Kitchen albuterol (PROVENTIL HFA) 108 (90 BASE) MCG/ACT inhaler Inhale 2 puffs into the lungs every 4 (four) hours as needed. For wheezing 8.5 g 2  . allopurinol (ZYLOPRIM) 100 MG tablet Take 2 tablets (200 mg total) by mouth daily. 60 tablet 1  . amLODipine (NORVASC) 5 MG tablet Take 1 tablet (5 mg total) by mouth daily. 90 tablet 3  . aspirin 325 MG tablet Take 325 mg by mouth at bedtime.     . [employer] ALLERGY RELIEF 180 MG tablet TAKE 1 TABLET BY MOUTH EVERY DAY 90 tablet 3  . glipiZIDE (GLUCOTROL XL) 5 MG 24 hr tablet Take 1 tablet (5 mg total) by mouth daily with breakfast. (Patient taking differently: Take 5 mg by mouth every evening. ) 30 tablet 11  . glucosamine-chondroitin 500-400 MG tablet Take 1 tablet by mouth 2 (two) times daily.      Marland Kitchen HYDROcodone-acetaminophen (NORCO/VICODIN) 5-325 MG per tablet Take 1 tablet by mouth every 12 (twelve) hours as needed for moderate pain. 60 tablet 0  . insulin glargine (  LANTUS) 100 UNIT/ML injection Inject 0.13 mLs (13 Units total) into the skin every morning. If blood sugar is >140 add 1 unit. If blood sugar is <140 subtract 1 unit. 10 mL 2  . losartan (COZAAR) 25 MG tablet TAKE 1 TABLET BY MOUTH EVERY DAY 30 tablet 1  . metoprolol succinate (TOPROL-XL) 25 MG 24 hr tablet Take 0.5 tablets (12.5 mg total) by mouth daily. 90 tablet 3  . Omega-3 Fatty Acids (FISH OIL) 1200 MG CAPS Take 2 capsules by mouth 2 (two) times daily.    Marland Kitchen omeprazole (PRILOSEC) 20 MG capsule TAKE ONE CAPSULE BY MOUTH AT BEDTIME 90 capsule 3  . oxyCODONE-acetaminophen (PERCOCET/ROXICET) 5-325 MG per tablet Take 1 tablet by mouth 3 (three) times daily as needed for severe pain.    Vladimir Faster Glycol-Propyl Glycol (SYSTANE OP) Apply 1-2 drops to eye 4 (four) times daily as needed. For dry eyes    . pravastatin (PRAVACHOL) 40 MG tablet Take 1 tablet (40 mg total) by mouth daily. (Patient taking differently: Take 40 mg by mouth every  evening. ) 90 tablet 3  . Tiotropium Bromide Monohydrate (SPIRIVA RESPIMAT) 2.5 MCG/ACT AERS Inhale 2 puffs once daily 1 Inhaler 11  . vitamin B-12 (CYANOCOBALAMIN) 1000 MCG tablet Take 1,000 mcg by mouth every morning.     . nitroGLYCERIN (NITROSTAT) 0.4 MG SL tablet Place 0.4 mg under the tongue every 5 (five) minutes as needed. For chest pain     Call 911 if pain not relieved by 3rd tab      Objective: BP 122/67 mmHg  Pulse 88  Temp(Src) 97.4 F (36.3 C) (Oral)  Resp 22  Ht 5' (1.524 m)  Wt 195 lb (88.451 kg)  BMI 38.08 kg/m2  SpO2 98% Exam: Gen: NAD and alert in bed with BiPAP mask on HEENT: NCAT, EOMI CV: RRR, good S1/S2, no murmur  Resp: Crackles in the bases bilaterally, however limited by anterior exam, on BiPAP Abd: Slight tenderness in the right upper quadrant, positive bowel sounds, no guarding Ext: 2+ pitting edema bilateral lower extremities to the midcalf, doughy edema across the foot dorsum bilaterally, 2+ DP pulses bilaterally Neuro: Alert and oriented, strength 5/5 in bilateral upper and lower extremities   Labs and Imaging: CBC BMET   Recent Labs Lab 10/28/14 1255  WBC 7.3  HGB 10.6*  HCT 33.4*  PLT 207    Recent Labs Lab 10/28/14 1255  NA 144  K 4.2  CL 106  CO2 18*  BUN 50*  CREATININE 2.79*  GLUCOSE 107*  CALCIUM 9.9     ProBNP 12,267 Point-of-care troponin 0.01  2 view chest x-ray 10/28/2014 IMPRESSION: Stable chest radiograph findings. No acute disease.   EKG with no discrete ischemic signs by with first-degree heart block, slight right axis deviation, and T-wave inversion in V4 through V6  Timmothy Euler, MD 10/28/2014, 4:20 PM PGY-3, [facility] pager: [phone], text pages welcome

## 2014-10-28 NOTE — ED Notes (Signed)
Pt experiences dyspnea on exertion, and this has been going on for 2 weeks.  Pt has 3+ edema in her lower extremities.  Pt states she has not been sick and does not have CP at this time.

## 2014-10-28 NOTE — ED Notes (Signed)
Spoke with 1st nurse Caryl Pina gave history and chief complaint. Pt will be transferred via shuttle.

## 2014-10-28 NOTE — ED Provider Notes (Signed)
Kristi [name] is a 73 y.o. female who presents to Urgent Care today for shortness of breath. Patient has a two-week history of shortness of breath worsening on exertion. Is been worsening recently. She denies any significant wheezing or coughing. She has a history of heart failure kidney failure and COPD. She has used her albuterol which helped only a little. She additionally notes worsening lower extremity edema bilaterally. She continues to use her Lasix which helps some.   Past Medical History  Diagnosis Date  . Hyperlipidemia   . Hyperparathyroidism   . CAD (coronary artery disease)   . Myocardial infarction 2008  . Hypertension   . COPD (chronic obstructive pulmonary disease)   . Diabetes mellitus     diagnosed 2010 - takes oral meds and lantus insulin  . Anemia   . Chronic kidney disease     esrd - since 2010  . GERD (gastroesophageal reflux disease)     on omeprazole  . Arthritis     all over  . CHF (congestive heart failure) 08-2011  . CVA 02/19/2010  . CAD 02/19/2010  . DEEP VENOUS THROMBOPHLEBITIS, LEG, LEFT 03/08/2010    in 2009 s/p hospitalization- only 1 isolated dvt  . Stroke ?2010    found remote CVA on CT scan   Past Surgical History  Procedure Laterality Date  . Av fistula placement  04/05/11    Left Radiocephalic AVF  . Appendectomy  1969  . Tubal ligation    . Revision of fisturl    . Coronary angioplasty with stent placement  06/23/2007    by Dr. Irish Lack is her cardiologist  . Ligation goretex fistula  10/29/2011    Procedure: LIGATION GORE-TEX FISTULA;  Surgeon: Elam Dutch, MD;  Location: [facility] OR;  Service: Vascular;  Laterality: Left;  Ligation of competing branches left forearm fistula   History  Substance Use Topics  . Smoking status: Former Smoker -- 0.50 packs/day for 53 years    Types: Cigarettes    Start date: 12/09/1960    Quit date: 03/31/2014  . Smokeless tobacco: Never Used  . Alcohol Use: No   ROS as above Medications: Current  Facility-Administered Medications  Medication Dose Route Frequency Provider Last Rate Last Dose  . ipratropium-albuterol (DUONEB) 0.5-2.5 (3) MG/3ML nebulizer solution 3 mL  3 mL Nebulization Once Gregor Hams, MD       Current Outpatient Prescriptions  Medication Sig Dispense Refill  . acetaminophen (TYLENOL) 500 MG tablet Take 1,000 mg by mouth every 6 (six) hours as needed.    Marland Kitchen acetaminophen-codeine (TYLENOL #3) 300-30 MG per tablet 1 tablet every 6 (six) hours as needed for moderate pain.     Marland Kitchen albuterol (PROVENTIL HFA) 108 (90 BASE) MCG/ACT inhaler Inhale 2 puffs into the lungs every 4 (four) hours as needed. For wheezing 8.5 g 2  . allopurinol (ZYLOPRIM) 100 MG tablet Take 2 tablets (200 mg total) by mouth daily. 60 tablet 1  . amLODipine (NORVASC) 5 MG tablet Take 1 tablet (5 mg total) by mouth daily. 90 tablet 3  . aspirin 325 MG tablet Take 325 mg by mouth at bedtime.     . [facility] ALLERGY RELIEF 180 MG tablet TAKE 1 TABLET BY MOUTH EVERY DAY 90 tablet 3  . glipiZIDE (GLUCOTROL XL) 5 MG 24 hr tablet Take 1 tablet (5 mg total) by mouth daily with breakfast. 30 tablet 11  . glucosamine-chondroitin 500-400 MG tablet Take 1 tablet by mouth 2 (two) times daily.      Marland Kitchen  glucose blood (ONE TOUCH ULTRA TEST) test strip Use as instructed 100 each 3  . HYDROcodone-acetaminophen (NORCO/VICODIN) 5-325 MG per tablet Take 1 tablet by mouth every 12 (twelve) hours as needed for moderate pain. 60 tablet 0  . insulin glargine (LANTUS) 100 UNIT/ML injection Inject 0.13 mLs (13 Units total) into the skin every morning. If blood sugar is >140 add 1 unit. If blood sugar is <140 subtract 1 unit. 10 mL 2  . Insulin Pen Needle (B-D ULTRAFINE III SHORT PEN) 31G X 8 MM MISC USE AS DIRECTED 2 TIMES A DAY 100 each 1  . losartan (COZAAR) 25 MG tablet TAKE 1 TABLET BY MOUTH EVERY DAY 30 tablet 1  . metoprolol succinate (TOPROL-XL) 25 MG 24 hr tablet Take 0.5 tablets (12.5 mg total) by mouth daily. 90 tablet 3  .  metoprolol tartrate (LOPRESSOR) 25 MG tablet Take 0.5 tablets (12.5 mg total) by mouth at bedtime. 90 tablet 3  . nitroGLYCERIN (NITROSTAT) 0.4 MG SL tablet Place 0.4 mg under the tongue every 5 (five) minutes as needed. For chest pain     Call 911 if pain not relieved by 3rd tab    . Omega-3 Fatty Acids (FISH OIL) 1200 MG CAPS Take 2 capsules by mouth 2 (two) times daily.    Marland Kitchen omeprazole (PRILOSEC) 20 MG capsule TAKE ONE CAPSULE BY MOUTH AT BEDTIME 90 capsule 3  . oxyCODONE-acetaminophen (PERCOCET/ROXICET) 5-325 MG per tablet Take 1 tablet by mouth 3 (three) times daily as needed for severe pain.    Vladimir Faster Glycol-Propyl Glycol (SYSTANE OP) Apply 1-2 drops to eye 4 (four) times daily as needed. For dry eyes    . pravastatin (PRAVACHOL) 40 MG tablet Take 1 tablet (40 mg total) by mouth daily. 90 tablet 3  . rosuvastatin (CRESTOR) 20 MG tablet TAKE 1 TABLET BY MOUTH AT BEDTIME 30 tablet 6  . Tiotropium Bromide Monohydrate (SPIRIVA RESPIMAT) 2.5 MCG/ACT AERS Inhale 2 puffs once daily 1 Inhaler 11  . vitamin B-12 (CYANOCOBALAMIN) 1000 MCG tablet Take 1,000 mcg by mouth every morning.      Allergies  Allergen Reactions  . Losartan Other (See Comments)    Causes increase in creatinine and worsening CKD  . Varenicline Nausea Only and Other (See Comments)    Dizzy and GI symptoms      Exam:  BP 133/57 mmHg  Pulse 76  Temp(Src) 97 F (36.1 C) (Oral)  SpO2 90% Gen: Well NAD HEENT: EOMI,  MMM Lungs: Normal work of breathing. Bibasilar crackles. No significant wheezing Heart: RRR no MRG Abd: NABS, Soft. Nondistended, Nontender Exts: Bilateral lower extremity edema is present  Patient was given a 2.5/0.5 mg DuoNeb nebulizer treatment, and felt only a little better  Twelve-lead EKG shows normal sinus rhythm with PVCs at a rate of 76. Patient has a first-degree AV block with PR interval of 250. She has flattened lateral precordial T waves but no ST segment elevation or depression. EKG is  mildly change from previous EKG. Previously her PR interval was around 200. The QRS ST and T wave has not changed much.  No results found for this or any previous visit (from the past 24 hour(s)). No results found.  Assessment and Plan: 73 y.o. female with shortness of breath on exertion. Suspect oxygen saturation desaturates with ambulation but did not check.  Plan to transfer to the emergency department for evaluation and treatment. Suspect fluid retention due to CHF or CKD exacerbation. Doubt that COPD is a major  factor. Transfer via shuttle.  Discussed warning signs or symptoms. Please see discharge instructions. Patient expresses understanding.     Gregor Hams, MD 10/28/14 [phone]

## 2014-10-28 NOTE — Plan of Care (Deleted)
Problem: Consults Goal: Atrial Arhythmia Patient Education (See Patient Education module for education specifics.) Outcome: Completed/Met Date Met:  10/28/14 Goal: Skin Care Protocol Initiated - if Braden Score 18 or less If consults are not indicated, leave blank or document N/A Outcome: Completed/Met Date Met:  10/28/14  Problem: Phase I Progression Outcomes Goal: Ventricular heart rate < 120/min Outcome: Progressing

## 2014-10-28 NOTE — ED Notes (Signed)
Contacted respiratory

## 2014-10-28 NOTE — Consult Note (Addendum)
Reason for Consult: Volume overload in a patient with chronic kidney disease stage IV Referring Physician: Dr Miller- ED Physician  HPI:  73-year-old Caucasian woman with past medical history significant for chronic kidney disease stage IV (baseline creatinine ranging 2.7-3.0) who follows up closely with Dr. Dunham. Her chronic kidney disease appears to have been from underlying diabetes and hypertension and has been relatively stable over the past 3 years and she has had a left radiocephalic fistula placed in anticipation of dialysis.  She presents with increasing shortness of breath over the past 2 weeks as well as increasing pedal edema for the past 3 or so weeks. She reports that shortness of breath is worse on exertion and denies any significant cough, sputum production or wheeze. She has been intermittently noncompliant with her furosemide-when takes it, she takes 2-3 tablets (of 40 mg) a day. Unfortunately, she has not been compliant with a low sodium diet and eats several high sodium foods such as potato chips. Denies any use of nonsteroidal anti-inflammatory drugs. Denies any distinct chest pain/diaphoresis.  She denies any dysuria, urgency, frequency, flank pain, fever or chills.  Past Medical History  Diagnosis Date  . Hyperlipidemia   . Hyperparathyroidism   . CAD (coronary artery disease)   . Myocardial infarction 2008  . Hypertension   . COPD (chronic obstructive pulmonary disease)   . Diabetes mellitus     diagnosed 2010 - takes oral meds and lantus insulin  . Anemia   . Chronic kidney disease     esrd - since 2010  . GERD (gastroesophageal reflux disease)     on omeprazole  . Arthritis     all over  . CHF (congestive heart failure) 08-2011  . CVA 02/19/2010  . CAD 02/19/2010  . DEEP VENOUS THROMBOPHLEBITIS, LEG, LEFT 03/08/2010    in 2009 s/p hospitalization- only 1 isolated dvt  . Stroke ?2010    found remote CVA on CT scan    Past Surgical History  Procedure  Laterality Date  . Av fistula placement  04/05/11    Left Radiocephalic AVF  . Appendectomy  1969  . Tubal ligation    . Revision of fisturl    . Coronary angioplasty with stent placement  06/23/2007    by Dr. Varanasi is her cardiologist  . Ligation goretex fistula  10/29/2011    Procedure: LIGATION GORE-TEX FISTULA;  Surgeon: Charles E Fields, MD;  Location: [facility] OR;  Service: Vascular;  Laterality: Left;  Ligation of competing branches left forearm fistula    Family History  Problem Relation Age of Onset  . Cancer Mother     multiple myloma  . Dementia Father   . Pneumonia Father   . Anuerysm Father     Social History:  reports that she quit smoking about 6 months ago. Her smoking use included Cigarettes. She started smoking about 53 years ago. She has a 26.5 pack-year smoking history. She has never used smokeless tobacco. She reports that she does not drink alcohol or use illicit drugs.  Allergies:  Allergies  Allergen Reactions  . Losartan Other (See Comments)    Causes increase in creatinine and worsening CKD  . Varenicline Nausea Only and Other (See Comments)    Dizzy and GI symptoms     No current facility-administered medications for this encounter. Current outpatient prescriptions: acetaminophen (TYLENOL) 500 MG tablet, Take 1,000 mg by mouth every 6 (six) hours as needed., Disp: , Rfl: ;  albuterol (PROVENTIL HFA) 108 (90 BASE)   MCG/ACT inhaler, Inhale 2 puffs into the lungs every 4 (four) hours as needed. For wheezing, Disp: 8.5 g, Rfl: 2;  allopurinol (ZYLOPRIM) 100 MG tablet, Take 2 tablets (200 mg total) by mouth daily., Disp: 60 tablet, Rfl: 1 amLODipine (NORVASC) 5 MG tablet, Take 1 tablet (5 mg total) by mouth daily., Disp: 90 tablet, Rfl: 3;  aspirin 325 MG tablet, Take 325 mg by mouth at bedtime. , Disp: , Rfl: ;  [employer] ALLERGY RELIEF 180 MG tablet, TAKE 1 TABLET BY MOUTH EVERY DAY, Disp: 90 tablet, Rfl: 3;  glipiZIDE (GLUCOTROL XL) 5 MG 24 hr tablet, Take 1 tablet (5  mg total) by mouth daily with breakfast., Disp: 30 tablet, Rfl: 11 glucosamine-chondroitin 500-400 MG tablet, Take 1 tablet by mouth 2 (two) times daily.  , Disp: , Rfl: ;  HYDROcodone-acetaminophen (NORCO/VICODIN) 5-325 MG per tablet, Take 1 tablet by mouth every 12 (twelve) hours as needed for moderate pain., Disp: 60 tablet, Rfl: 0 insulin glargine (LANTUS) 100 UNIT/ML injection, Inject 0.13 mLs (13 Units total) into the skin every morning. If blood sugar is >140 add 1 unit. If blood sugar is <140 subtract 1 unit., Disp: 10 mL, Rfl: 2;  losartan (COZAAR) 25 MG tablet, TAKE 1 TABLET BY MOUTH EVERY DAY, Disp: 30 tablet, Rfl: 1;  metoprolol succinate (TOPROL-XL) 25 MG 24 hr tablet, Take 0.5 tablets (12.5 mg total) by mouth daily., Disp: 90 tablet, Rfl: 3 Omega-3 Fatty Acids (FISH OIL) 1200 MG CAPS, Take 2 capsules by mouth 2 (two) times daily., Disp: , Rfl: ;  omeprazole (PRILOSEC) 20 MG capsule, TAKE ONE CAPSULE BY MOUTH AT BEDTIME, Disp: 90 capsule, Rfl: 3;  Polyethyl Glycol-Propyl Glycol (SYSTANE OP), Apply 1-2 drops to eye 4 (four) times daily as needed. For dry eyes, Disp: , Rfl: ;  pravastatin (PRAVACHOL) 40 MG tablet, Take 1 tablet (40 mg total) by mouth daily., Disp: 90 tablet, Rfl: 3 Tiotropium Bromide Monohydrate (SPIRIVA RESPIMAT) 2.5 MCG/ACT AERS, Inhale 2 puffs once daily, Disp: 1 Inhaler, Rfl: 11;  vitamin B-12 (CYANOCOBALAMIN) 1000 MCG tablet, Take 1,000 mcg by mouth every morning. , Disp: , Rfl: ;  acetaminophen-codeine (TYLENOL #3) 300-30 MG per tablet, 1 tablet every 6 (six) hours as needed for moderate pain. , Disp: , Rfl:  nitroGLYCERIN (NITROSTAT) 0.4 MG SL tablet, Place 0.4 mg under the tongue every 5 (five) minutes as needed. For chest pain     Call 911 if pain not relieved by 3rd tab, Disp: , Rfl: ;  oxyCODONE-acetaminophen (PERCOCET/ROXICET) 5-325 MG per tablet, Take 1 tablet by mouth 3 (three) times daily as needed for severe pain., Disp: , Rfl:   Results for orders placed or  performed during the hospital encounter of 10/28/14 (from the past 48 hour(s))  CBC     Status: Abnormal   Collection Time: 10/28/14 12:55 PM  Result Value Ref Range   WBC 7.3 4.0 - 10.5 K/uL   RBC 3.60 (L) 3.87 - 5.11 MIL/uL   Hemoglobin 10.6 (L) 12.0 - 15.0 g/dL   HCT 33.4 (L) 36.0 - 46.0 %   MCV 92.8 78.0 - 100.0 fL   MCH 29.4 26.0 - 34.0 pg   MCHC 31.7 30.0 - 36.0 g/dL   RDW 17.4 (H) 11.5 - 15.5 %   Platelets 207 150 - 400 K/uL  Basic metabolic panel     Status: Abnormal   Collection Time: 10/28/14 12:55 PM  Result Value Ref Range   Sodium 144 137 - 147 mEq/L     Potassium 4.2 3.7 - 5.3 mEq/L   Chloride 106 96 - 112 mEq/L   CO2 18 (L) 19 - 32 mEq/L   Glucose, Bld 107 (H) 70 - 99 mg/dL   BUN 50 (H) 6 - 23 mg/dL   Creatinine, Ser 2.79 (H) 0.50 - 1.10 mg/dL   Calcium 9.9 8.4 - 10.5 mg/dL   GFR calc non Af Amer 16 (L) >90 mL/min   GFR calc Af Amer 18 (L) >90 mL/min    Comment: (NOTE) The eGFR has been calculated using the CKD EPI equation. This calculation has not been validated in all clinical situations. eGFR's persistently <90 mL/min signify possible Chronic Kidney Disease.    Anion gap 20 (H) 5 - 15  BNP (order ONLY if patient complains of dyspnea/SOB AND you have documented it for THIS visit)     Status: Abnormal   Collection Time: 10/28/14 12:55 PM  Result Value Ref Range   Pro B Natriuretic peptide (BNP) 12267.0 (H) 0 - 125 pg/mL  I-stat troponin, ED (not at [facility])     Status: None   Collection Time: 10/28/14  1:01 PM  Result Value Ref Range   Troponin i, poc 0.01 0.00 - 0.08 ng/mL   Comment 3            Comment: Due to the release kinetics of cTnI, a negative result within the first hours of the onset of symptoms does not rule out myocardial infarction with certainty. If myocardial infarction is still suspected, repeat the test at appropriate intervals.     Dg Chest 2 View  10/28/2014   CLINICAL DATA:  Shortness of breath and COPD. History of smoking. Chest  pain.  EXAM: CHEST  2 VIEW  COMPARISON:  10/30/2012  FINDINGS: Coarse lung markings are similar to the previous examination. No focal airspace disease. Heart size is upper limits of normal but unchanged. Atherosclerotic calcifications at the aortic arch. No acute bone abnormality.  IMPRESSION: Stable chest radiograph findings.  No acute disease.   Electronically Signed   By: Markus Daft M.D.   On: 10/28/2014 13:29    Review of Systems  Constitutional: Positive for malaise/fatigue. Negative for fever and chills.  HENT: Negative.   Eyes: Negative.   Respiratory: Positive for shortness of breath. Negative for cough, hemoptysis, sputum production and wheezing.   Cardiovascular: Positive for orthopnea and leg swelling. Negative for chest pain, palpitations and claudication.  Gastrointestinal: Negative for nausea, vomiting, abdominal pain and diarrhea.  Genitourinary: Negative.   Musculoskeletal: Positive for back pain. Negative for myalgias, joint pain and neck pain.  Skin: Negative.   Neurological: Positive for weakness. Negative for dizziness, tingling and tremors.  Endo/Heme/Allergies: Negative.   Psychiatric/Behavioral: Negative for depression. The patient is nervous/anxious.    Blood pressure 122/67, pulse 88, temperature 97.4 F (36.3 C), temperature source Oral, resp. rate 22, height 5' (1.524 m), weight 88.451 kg (195 lb), SpO2 98 %. Physical Exam  Nursing note and vitals reviewed. Constitutional: She is oriented to person, place, and time. She appears well-developed. No distress.  Obese elderly Caucasian woman  HENT:  Head: Normocephalic and atraumatic.  Nose: Nose normal.  Mouth/Throat: No oropharyngeal exudate.  Eyes: EOM are normal. Pupils are equal, round, and reactive to light. No scleral icterus.  Neck: Normal range of motion. Neck supple. No JVD present.  Cardiovascular: Normal rate, regular rhythm and normal heart sounds.   No murmur heard. Respiratory: She has no wheezes.  She has rales.  Distant breath sounds  with fine rales over left base, no rhonchi  GI: Soft. Bowel sounds are normal. There is no tenderness. There is no rebound and no guarding.  Musculoskeletal: Normal range of motion. She exhibits edema.  2-3+ LE edema bilaterally - symmetrical  Neurological: She is alert and oriented to person, place, and time. No cranial nerve deficit.  Skin: Skin is warm and dry. No erythema.  Psychiatric: She has a normal mood and affect.    Assessment/Plan: 1. Volume overload in patient with chronic kidney disease stage IV: I recommend switching over to intravenous furosemide 80 mg twice a day and monitoring urinary output/symptoms. I do not feel that her shortness of breath is entirely from volume overload given physical exam findings (no JVD, no convincing rales on pulmonary exam) as well as radiological findings-no edema seen on chest x-ray. Will monitor daily to see if she needs supplementation with metolazone. Currently on noninvasive positive pressure ventilation-BiPAP for hypoxia. Will have a Foley catheter inserted for strict input/output monitoring and to limit exertional dyspnea. If she is truly diuretic responsive, will consider dialysis. 2. CHF exacerbation: She has diastolic heart failure with a preserved ejection fraction. Elevated proBNP noted but no significant rales/edema on chest x-ray. Undertake diuresis and cycle enzymes to ensure that this is not an anginal equivalent. 3. Anemia: Anemia of chronic kidney disease, will check iron stores and supplement as needed. Will resume ESA dosing. 4. History of chronic obstructive lung disease: No evidence of bronchospasm, would resume her usual inhaled anticholinergics/bronchodilator therapy.   , K. 10/28/2014, 3:35 PM      

## 2014-10-28 NOTE — Plan of Care (Signed)
Problem: Consults Goal: Heart Failure Patient Education (See Patient Education module for education specifics.) Outcome: Completed/Met Date Met:  10/28/14 Goal: Skin Care Protocol Initiated - if Braden Score 18 or less If consults are not indicated, leave blank or document N/A Outcome: Completed/Met Date Met:  10/28/14 Goal: Nutrition Consult-if indicated Outcome: Completed/Met Date Met:  10/28/14 Goal: Diabetes Guidelines if Diabetic/Glucose > 140 If diabetic or lab glucose is > 140 mg/dl - Initiate Diabetes/Hyperglycemia Guidelines & Document Interventions  Outcome: Completed/Met Date Met:  10/28/14  Problem: Phase I Progression Outcomes Goal: Dyspnea controlled at rest (HF) Outcome: Progressing Goal: Pain controlled with appropriate interventions Outcome: Completed/Met Date Met:  10/28/14

## 2014-10-28 NOTE — ED Notes (Signed)
Attempted report 

## 2014-10-28 NOTE — ED Notes (Signed)
Pt having increasing SOB over the past few weeks. Denies any chest pain. sts wore with exertion.

## 2014-10-28 NOTE — ED Provider Notes (Signed)
CSN: [id number]     Arrival date & time 10/28/14  1241 History   First MD Initiated Contact with Patient 10/28/14 1334     Chief Complaint  Patient presents with  . Shortness of Breath     (Consider location/radiation/quality/duration/timing/severity/associated sxs/prior Treatment) HPI   Kristi [name] is a 73 y.o. female with past medical history significant for insulin dependent diabetes, CKD, COPD, CAD (inferior wall MI 2007) and CHF (2013 echo with EF of 55%) complaining of worsening dyspnea on exertion shortness of breath over the last 2 weeks. Patient denies chest pain, cough, fever, anticoagulation; endorses increasing peripheral edema, does not know her dry weight. Quit smoking 6 weeks ago. Is ESRD with fistula but has not started dialysis. Taking Lasix 40 minute to 60 mg daily, has been compliant, had morning dose. Noncompliant with low sodium diet states she is eating more potatoes to chips than she should.  Cards: Irish Lack   Past Medical History  Diagnosis Date  . Hyperlipidemia   . Hyperparathyroidism   . CAD (coronary artery disease)   . Myocardial infarction 2008  . Hypertension   . COPD (chronic obstructive pulmonary disease)   . Diabetes mellitus     diagnosed 2010 - takes oral meds and lantus insulin  . Anemia   . Chronic kidney disease     esrd - since 2010  . GERD (gastroesophageal reflux disease)     on omeprazole  . Arthritis     all over  . CHF (congestive heart failure) 08-2011  . CVA 02/19/2010  . CAD 02/19/2010  . DEEP VENOUS THROMBOPHLEBITIS, LEG, LEFT 03/08/2010    in 2009 s/p hospitalization- only 1 isolated dvt  . Stroke ?2010    found remote CVA on CT scan   Past Surgical History  Procedure Laterality Date  . Av fistula placement  04/05/11    Left Radiocephalic AVF  . Appendectomy  1969  . Tubal ligation    . Revision of fisturl    . Coronary angioplasty with stent placement  06/23/2007    by Dr. Irish Lack is her cardiologist  . Ligation  goretex fistula  10/29/2011    Procedure: LIGATION GORE-TEX FISTULA;  Surgeon: Elam Dutch, MD;  Location: [facility] OR;  Service: Vascular;  Laterality: Left;  Ligation of competing branches left forearm fistula   Family History  Problem Relation Age of Onset  . Cancer Mother     multiple myloma  . Dementia Father   . Pneumonia Father   . Anuerysm Father    History  Substance Use Topics  . Smoking status: Former Smoker -- 0.50 packs/day for 53 years    Types: Cigarettes    Start date: 12/09/1960    Quit date: 03/31/2014  . Smokeless tobacco: Never Used  . Alcohol Use: No   OB History    No data available     Review of Systems  10 systems reviewed and found to be negative, except as noted in the HPI.   Allergies  Losartan and Varenicline  Home Medications   Prior to Admission medications   Medication Sig Start Date End Date Taking? Authorizing Provider  acetaminophen (TYLENOL) 500 MG tablet Take 1,000 mg by mouth every 6 (six) hours as needed.    Historical Provider, MD  acetaminophen-codeine (TYLENOL #3) 300-30 MG per tablet 1 tablet every 6 (six) hours as needed for moderate pain.  01/31/14   Historical Provider, MD  albuterol (PROVENTIL HFA) 108 (90 BASE) MCG/ACT inhaler Inhale 2  puffs into the lungs every 4 (four) hours as needed. For wheezing 02/16/14   Angelica Ran, MD  allopurinol (ZYLOPRIM) 100 MG tablet Take 2 tablets (200 mg total) by mouth daily. 09/06/14   Elberta Leatherwood, MD  amLODipine (NORVASC) 5 MG tablet Take 1 tablet (5 mg total) by mouth daily. 09/16/14   Elberta Leatherwood, MD  aspirin 325 MG tablet Take 325 mg by mouth at bedtime.     Historical Provider, MD  [facility] ALLERGY RELIEF 180 MG tablet TAKE 1 TABLET BY MOUTH EVERY DAY 02/14/13   Angelica Ran, MD  glipiZIDE (GLUCOTROL XL) 5 MG 24 hr tablet Take 1 tablet (5 mg total) by mouth daily with breakfast. 09/16/14   Elberta Leatherwood, MD  glucosamine-chondroitin 500-400 MG tablet Take 1 tablet by mouth 2 (two)  times daily.      Historical Provider, MD  glucose blood (ONE TOUCH ULTRA TEST) test strip Use as instructed 01/14/14   Angelica Ran, MD  HYDROcodone-acetaminophen (NORCO/VICODIN) 5-325 MG per tablet Take 1 tablet by mouth every 12 (twelve) hours as needed for moderate pain. 09/16/14   Elberta Leatherwood, MD  insulin glargine (LANTUS) 100 UNIT/ML injection Inject 0.13 mLs (13 Units total) into the skin every morning. If blood sugar is >140 add 1 unit. If blood sugar is <140 subtract 1 unit. 03/18/14   Angelica Ran, MD  Insulin Pen Needle (B-D ULTRAFINE III SHORT PEN) 31G X 8 MM MISC USE AS DIRECTED 2 TIMES A DAY 03/16/13   Angelica Ran, MD  losartan (COZAAR) 25 MG tablet TAKE 1 TABLET BY MOUTH EVERY DAY 08/12/14   Elberta Leatherwood, MD  metoprolol succinate (TOPROL-XL) 25 MG 24 hr tablet Take 0.5 tablets (12.5 mg total) by mouth daily. 09/16/14   Elberta Leatherwood, MD  metoprolol tartrate (LOPRESSOR) 25 MG tablet Take 0.5 tablets (12.5 mg total) by mouth at bedtime. 05/20/14   Angelica Ran, MD  nitroGLYCERIN (NITROSTAT) 0.4 MG SL tablet Place 0.4 mg under the tongue every 5 (five) minutes as needed. For chest pain     Call 911 if pain not relieved by 3rd tab    Historical Provider, MD  Omega-3 Fatty Acids (FISH OIL) 1200 MG CAPS Take 2 capsules by mouth 2 (two) times daily.    Historical Provider, MD  omeprazole (PRILOSEC) 20 MG capsule TAKE ONE CAPSULE BY MOUTH AT BEDTIME 04/27/14   Angelica Ran, MD  oxyCODONE-acetaminophen (PERCOCET/ROXICET) 5-325 MG per tablet Take 1 tablet by mouth 3 (three) times daily as needed for severe pain.    Historical Provider, MD  Polyethyl Glycol-Propyl Glycol (SYSTANE OP) Apply 1-2 drops to eye 4 (four) times daily as needed. For dry eyes    Historical Provider, MD  pravastatin (PRAVACHOL) 40 MG tablet Take 1 tablet (40 mg total) by mouth daily. 09/16/14   Elberta Leatherwood, MD  rosuvastatin (CRESTOR) 20 MG tablet TAKE 1 TABLET BY MOUTH AT BEDTIME 12/13/13   Melony Overly, MD  Tiotropium Bromide Monohydrate (SPIRIVA RESPIMAT) 2.5 MCG/ACT AERS Inhale 2 puffs once daily 03/24/14   Zigmund Gottron, MD  vitamin B-12 (CYANOCOBALAMIN) 1000 MCG tablet Take 1,000 mcg by mouth every morning.     Historical Provider, MD   BP 131/51 mmHg  Pulse 80  Temp(Src) 97.4 F (36.3 C) (Oral)  Resp 28  Ht 5' (1.524 m)  Wt 195 lb (88.451 kg)  BMI 38.08 kg/m2  SpO2 95% Physical Exam  Constitutional: She is oriented to person, place, and time. She appears well-developed and well-nourished. No distress.  HENT:  Head: Normocephalic and atraumatic.  Mouth/Throat: Oropharynx is clear and moist.  Eyes: Conjunctivae and EOM are normal. Pupils are equal, round, and reactive to light.  Neck: Normal range of motion.  Cardiovascular: Normal rate, regular rhythm and intact distal pulses.   Fistula to left wrist with good thrill.  Pulmonary/Chest: Effort normal and breath sounds normal. No stridor. No respiratory distress. She has no wheezes. She has no rales. She exhibits no tenderness.  Tachypnea with increased work of breathing. 88% on room air, responds well to oxygen via nasal cannula up to 98% on room air.  Abdominal: Soft. Bowel sounds are normal. She exhibits no distension and no mass. There is no tenderness. There is no rebound and no guarding.  Musculoskeletal: Normal range of motion. She exhibits edema. She exhibits no tenderness.  3+ pitting edema  Neurological: She is alert and oriented to person, place, and time.  Psychiatric: She has a normal mood and affect.  Nursing note and vitals reviewed.   ED Course  Procedures (including critical care time)  CRITICAL CARE Performed by: Monico Blitz   Total critical care time: 35  Critical care time was exclusive of separately billable procedures and treating other patients.  Critical care was necessary to treat or prevent imminent or life-threatening deterioration.  Critical care was time spent personally  by me on the following activities: development of treatment plan with patient and/or surrogate as well as nursing, discussions with consultants, evaluation of patient's response to treatment, examination of patient, obtaining history from patient or surrogate, ordering and performing treatments and interventions, ordering and review of laboratory studies, ordering and review of radiographic studies, pulse oximetry and re-evaluation of patient's condition.   Labs Review Labs Reviewed  CBC - Abnormal; Notable for the following:    RBC 3.60 (*)    Hemoglobin 10.6 (*)    HCT 33.4 (*)    RDW 17.4 (*)    All other components within normal limits  BASIC METABOLIC PANEL - Abnormal; Notable for the following:    CO2 18 (*)    Glucose, Bld 107 (*)    BUN 50 (*)    Creatinine, Ser 2.79 (*)    GFR calc non Af Amer 16 (*)    GFR calc Af Amer 18 (*)    Anion gap 20 (*)    All other components within normal limits  PRO B NATRIURETIC PEPTIDE - Abnormal; Notable for the following:    Pro B Natriuretic peptide (BNP) 12267.0 (*)    All other components within normal limits  I-STAT TROPOININ, ED    Imaging Review Dg Chest 2 View  10/28/2014   CLINICAL DATA:  Shortness of breath and COPD. History of smoking. Chest pain.  EXAM: CHEST  2 VIEW  COMPARISON:  10/30/2012  FINDINGS: Coarse lung markings are similar to the previous examination. No focal airspace disease. Heart size is upper limits of normal but unchanged. Atherosclerotic calcifications at the aortic arch. No acute bone abnormality.  IMPRESSION: Stable chest radiograph findings.  No acute disease.   Electronically Signed   By: Markus Daft M.D.   On: 10/28/2014 13:29     EKG Interpretation None      MDM   Final diagnoses:  Hypervolemia, unspecified hypervolemia type  ESRD (end stage renal disease)    Filed Vitals:   10/28/14 1400 10/28/14 1406 10/28/14 1445 10/28/14 1517  BP: 134/53  143/60 122/67  Pulse: 76  79 88  Temp:       TempSrc:      Resp: 15  30 22   Height:      Weight:      SpO2: 100% 88% 100% 98%    Medications  nitroGLYCERIN (NITROGLYN) 2 % ointment 1 inch (1 inch Topical Given 10/28/14 1415)  furosemide (LASIX) injection 40 mg (40 mg Intravenous Given 10/28/14 1417)    Kristi [name] is a 73 y.o. female presenting with increasing peripheral edema, shortness of breath, dyspnea on exertion. Patient is in chronic renal failure, has fistula in place but has not yet started dialysis GFR of 16 today. EKG is nonischemic, nonspecific T-wave changes, PR interval is prolonged and with PVC. Troponin is negative, triage initiated BNP is elevated at 12,000: Uninterpretable in the setting of ESRD. Patient has increased work of breathing, hypoxic at 88% on room air. Responds well to supplementation by nasal cannula. Patient with increasing peripheral edema, given IV Lasix 40 and Nitropaste. Bicarbonate is low and has an anion gap of 20, creatinine is elevated but not atypical for her baseline. Chest x-ray clear. Likely volume overload from diet noncompliance and renal dysfunction and need for initiation of dialysis  Cards consult: will eval via Evonnie Pat: nephrology will consult  This case with family practice resident who accepts patient for admission attending physician is Dr. Lisa Roca  Patient has responded well to oxygen by nasal cannula however she is still symptomatic, will change her to BiPAP.  Patient seems more comfortable after receiving BiPAP, she has received for 2 hours. Will be DC'd before her transition to the floor. Respiratory tech to check in with admitting doctor to see if they would like it reapplied.  Monico Blitz, PA-C 10/28/14 [place], PA-C 10/28/14 1625  Johnna Acosta, MD 10/28/14 2110

## 2014-10-29 DIAGNOSIS — J438 Other emphysema: Secondary | ICD-10-CM

## 2014-10-29 DIAGNOSIS — N184 Chronic kidney disease, stage 4 (severe): Secondary | ICD-10-CM | POA: Insufficient documentation

## 2014-10-29 DIAGNOSIS — I5033 Acute on chronic diastolic (congestive) heart failure: Principal | ICD-10-CM

## 2014-10-29 LAB — GLUCOSE, CAPILLARY
GLUCOSE-CAPILLARY: 68 mg/dL — AB (ref 70–99)
GLUCOSE-CAPILLARY: 165 mg/dL — AB (ref 70–99)
Glucose-Capillary: 69 mg/dL — ABNORMAL LOW (ref 70–99)
Glucose-Capillary: 73 mg/dL (ref 70–99)
Glucose-Capillary: 145 mg/dL — ABNORMAL HIGH (ref 70–99)
Glucose-Capillary: 194 mg/dL — ABNORMAL HIGH (ref 70–99)

## 2014-10-29 LAB — RENAL FUNCTION PANEL
ALBUMIN: 2.9 g/dL — AB (ref 3.5–5.2)
Anion gap: 18 — ABNORMAL HIGH (ref 5–15)
BUN: 53 mg/dL — AB (ref 6–23)
CHLORIDE: 108 meq/L (ref 96–112)
CO2: 20 mEq/L (ref 19–32)
CREATININE: 2.97 mg/dL — AB (ref 0.50–1.10)
Calcium: 9.3 mg/dL (ref 8.4–10.5)
GFR calc Af Amer: 17 mL/min — ABNORMAL LOW (ref >90)
GFR calc non Af Amer: 15 mL/min — ABNORMAL LOW (ref >90)
Glucose, Bld: 63 mg/dL — ABNORMAL LOW (ref 70–99)
Phosphorus: 5 mg/dL — ABNORMAL HIGH (ref 2.3–4.6)
Potassium: 3.8 mEq/L (ref 3.7–5.3)
Sodium: 146 mEq/L (ref 137–147)

## 2014-10-29 LAB — CBC
HCT: 28.6 % — ABNORMAL LOW (ref 36.0–46.0)
Hemoglobin: 9.1 g/dL — ABNORMAL LOW (ref 12.0–15.0)
MCH: 29.4 pg (ref 26.0–34.0)
MCHC: 31.8 g/dL (ref 30.0–36.0)
MCV: 92.3 fL (ref 78.0–100.0)
Platelets: 178 10*3/uL (ref 150–400)
RBC: 3.1 MIL/uL — AB (ref 3.87–5.11)
RDW: 17.3 % — ABNORMAL HIGH (ref 11.5–15.5)
WBC: 4.5 10*3/uL (ref 4.0–10.5)

## 2014-10-29 LAB — FERRITIN: Ferritin: 51 ng/mL (ref 10–291)

## 2014-10-29 LAB — TROPONIN I

## 2014-10-29 LAB — MAGNESIUM: MAGNESIUM: 2.5 mg/dL (ref 1.5–2.5)

## 2014-10-29 MED ORDER — ATORVASTATIN CALCIUM 20 MG PO TABS
20.0000 mg | ORAL_TABLET | Freq: Every day | ORAL | Status: DC
  Administered 2014-10-30: 20 mg via ORAL
  Filled 2014-10-29 (×3): qty 1

## 2014-10-29 MED ORDER — DARBEPOETIN ALFA 100 MCG/0.5ML IJ SOSY
100.0000 ug | PREFILLED_SYRINGE | INTRAMUSCULAR | Status: DC
  Administered 2014-10-29: 100 ug via SUBCUTANEOUS
  Filled 2014-10-29 (×2): qty 0.5

## 2014-10-29 NOTE — Progress Notes (Signed)
Subjective:  Breathing a little better. No CP.  Objective:  Vital Signs in the last 24 hours: Temp:  [97 F (36.1 C)-98.6 F (37 C)] 97.3 F (36.3 C) (11/21 0730) Pulse Rate:  [64-88] 73 (11/21 0730) Resp:  [12-30] 22 (11/21 0730) BP: (110-146)/(42-76) 121/76 mmHg (11/21 0730) SpO2:  [88 %-100 %] 98 % (11/21 0920) FiO2 (%):  [40 %] 40 % (11/20 1445) Weight:  [195 lb (88.451 kg)-202 lb 9.6 oz (91.9 kg)] 202 lb 9.6 oz (91.899 kg) (11/21 0300)  Intake/Output from previous day: 11/20 0701 - 11/21 0700 In: -  Out: 2330 [Urine:2330]   Physical Exam: General: Well developed, well nourished, in no acute distress. Head:  Normocephalic and atraumatic. Lungs: Clear to auscultation and percussion. Heart: Normal S1 and S2.  No murmur, rubs or gallops.  Abdomen: soft, non-tender, positive bowel sounds. Extremities: No clubbing or cyanosis. 1-2+BLE edema. Fistula in place Neurologic: Alert and oriented x 3.    Lab Results:  Recent Labs  10/28/14 1255 10/29/14 0338  WBC 7.3 4.5  HGB 10.6* 9.1*  PLT 207 178    Recent Labs  10/28/14 1255 10/29/14 0338  NA 144 146  K 4.2 3.8  CL 106 108  CO2 18* 20  GLUCOSE 107* 63*  BUN 50* 53*  CREATININE 2.79* 2.97*    Recent Labs  10/28/14 2140 10/29/14 0338  TROPONINI <0.30 <0.30   Hepatic Function Panel  Recent Labs  10/29/14 0338  ALBUMIN 2.9*    Imaging: Dg Chest 2 View  10/28/2014   CLINICAL DATA:  Shortness of breath and COPD. History of smoking. Chest pain.  EXAM: CHEST  2 VIEW  COMPARISON:  10/30/2012  FINDINGS: Coarse lung markings are similar to the previous examination. No focal airspace disease. Heart size is upper limits of normal but unchanged. Atherosclerotic calcifications at the aortic arch. No acute bone abnormality.  IMPRESSION: Stable chest radiograph findings.  No acute disease.   Electronically Signed   By: Markus Daft M.D.   On: 10/28/2014 13:29    Telemetry: No adverse rhythms Personally  viewed.   EKG:  10/29/14 - NSR with first degree AVB, NSSTW changes  Cardiac Studies:  ECHO 2013 - normal EF, severe pHTN 70mmHg  Scheduled Meds: . allopurinol  200 mg Oral Daily  . amLODipine  5 mg Oral Daily  . antiseptic oral rinse  7 mL Mouth Rinse BID  . aspirin  325 mg Oral QHS  . darbepoetin (ARANESP) injection - NON-DIALYSIS  100 mcg Subcutaneous Q Sat-1800  . furosemide  80 mg Intravenous BID  . heparin  5,000 Units Subcutaneous 3 times per day  . insulin aspart  0-5 Units Subcutaneous QHS  . insulin glargine  13 Units Subcutaneous QHS  . ipratropium-albuterol  3 mL Nebulization Q6H  . metoprolol succinate  12.5 mg Oral Daily  . omega-3 acid ethyl esters  1 g Oral Daily  . pantoprazole  40 mg Oral Daily  . pravastatin  40 mg Oral QPM  . sodium chloride  3 mL Intravenous Q12H   Continuous Infusions:  PRN Meds:.acetaminophen, oxyCODONE-acetaminophen  Assessment/Plan:  Principal Problem:   Volume overload Active Problems:   CHF exacerbation   COLD (chronic obstructive lung disease)   Essential hypertension, benign   Type 2 diabetes mellitus with complication   Chronic kidney disease (CKD), stage IV (severe)  Acute diastolic heart failure - BNP 12000 - Await repeat ECHO - -2.3 L with IV lasix 80 BID continue -  Renal following - Shortness of breath from HFpEF, COPD, PHTN, deconditioning.  - Encourage dietary compliance.   CAD - STEMI in July 2008  COPD - per primary team  CKD 4 - Dr. Posey Pronto note reviewed - Creat slightly increased. 2.79>>2.97 - Good overall response to lasix.   Anemia noted  HL - pravastatin  Will follow.   SKAINS, [place] 10/29/2014, 9:42 AM

## 2014-10-29 NOTE — Progress Notes (Signed)
Family Medicine Teaching Service Daily Progress Note Intern Pager: [phone]  Patient name: PHYLLIS ABELSON Medical record number: [id number] Date of birth: [birth year] Age: 73 y.o. Gender: female  Primary Care Provider: Elberta Leatherwood, MD Consultants: Nephrology, cardiology  Code Status: Full   Pt Overview and Major Events to Date:  11/20: Admitted with DOE x 2 weeks found to be fluid overloaded.  Assessment and Plan: REHMAT MURTAGH is a 73 y.o. female presenting with progressive dyspnea x 2 weeks found to be volume overloaded. PMH is significant for CKD stage IV, diastolic CHF, HTN, T2 DM, COPD, DVT, CVA hyperlipidemia.  #DyspneaVolume overload: Dyspnea likely multifactorial with volume overload seemingly the primary etiology, also concern for COPD exacerbation. There could also be components of her pulmonary hypertension and deconditioning as well. Volume overload also multifactorial with CK D stage IV and diastolic CHF. No acute process noted on CXR, proBNP grossly elevated to 12,267. EKG with no discrete ischemia noted but with first-degree heart block and T wave inversion noted in V4-V6. Troponins negative x 3. - Patient weaned from BiPAP to 2L ; no respiratory symptoms currently - Lasix 80mg  IV BID with good response, 2.3L out yesterday - Repeat EKG pending this AM - Echocardiogram today - Nephrology consulted, appreciate recommendations - Cardiology consulted, appreciate recommendations  - Strict I&Os and daily weights - Daily renal function panel, consider dialysis if unresponsive to Lasix  #CKD, stage IV: Thought to secondary to diabetes/HTN. Follows with Dr. Lorrene Reid. Baseline creatinine appears to be between 2.7-3.0 - Nephrology consulted, appreciate recommendations  - Creatinine 2.97 this AM, worsening most likely 2/2 Lasix  - Patient may require dialysis at some point  - Continue to monitor renal function   #T2 DM - Well-controlled A1c 6.7 - Hold home glipizide - Home  Lantus 13u daily  - CBGs 143-63   #HTN - Well-controlled overnight  - Continue home amlodipine, metoprolol - Hold losartan with likely renal stress due to Lasix  #COPD - Possibly contributing, however I do not feel this is an exacerbation; no discrete wheezing on exam  - DuoNebs q6hrs - Consider a prednisone burst +/- antibiotics if symptoms worsen/change   #Hyperlipidemia - On pravastatin currently - With risk factors, could consider changing to a high intensity statin   #Anemia: Most likely anemia of CKD. - Iron panel pending  - Supplement with iron as needed  FEN/GI: Heart healthy/carb modified, saline lock IV Prophylaxis: Subcutaneous heparin  Disposition: Pending improvement in diuresis/symptoms   Subjective:  Patient doing well. No SOB however she tells me she was previously fine lying down and sitting, it was with exertion that she became SOB. Occasionally has "twinges of pain" throughout her body.  Objective: Temp:  [97 F (36.1 C)-98.6 F (37 C)] 97.9 F (36.6 C) (11/21 0300) Pulse Rate:  [64-88] 67 (11/21 0400) Resp:  [12-30] 12 (11/21 0400) BP: (110-146)/(42-72) 121/58 mmHg (11/21 0400) SpO2:  [88 %-100 %] 95 % (11/21 0400) FiO2 (%):  [40 %] 40 % (11/20 1445) Weight:  [195 lb (88.451 kg)-202 lb 9.6 oz (91.9 kg)] 202 lb 9.6 oz (91.899 kg) (11/21 0300)    Intake/Output Summary (Last 24 hours) at 10/29/14 0836 Last data filed at 10/29/14 0457  Gross per 24 hour  Intake      0 ml  Output   2330 ml  Net  -2330 ml    Physical Exam: General: Lying flat in bed with 1 pillow in NAD Cardiovascular: RRR, no m/r/g noted. No JVD.  1+ DP and radial pulses bilaterally  Respiratory: Mild crackles bilaterally. No rhonchi or wheezing noted.  Abdomen: +BS, soft, ND/NT Extremities: No deformities. R foot > L foot in size (patient states this is chronic). 1+ pitting edema up to mid shin bilaterally.   Laboratory:  Recent Labs Lab 10/28/14 1255 10/29/14 0338  WBC  7.3 4.5  HGB 10.6* 9.1*  HCT 33.4* 28.6*  PLT 207 178    Recent Labs Lab 10/28/14 1255 10/29/14 0338  NA 144 146  K 4.2 3.8  CL 106 108  CO2 18* 20  BUN 50* 53*  CREATININE 2.79* 2.97*  CALCIUM 9.9 9.3  GLUCOSE 107* 63*   ProBNP 12,267 Point-of-care troponin 0.01  Imaging/Diagnostic Tests: 2 view chest x-ray 10/28/2014: Stable chest radiograph findings. No acute disease.  EKG with no discrete ischemic signs by with first-degree heart block, slight right axis deviation, and T-wave inversion in V4 through V6  Archie Patten, MD 10/29/2014, 5:41 AM PGY-1, [place] Intern pager: [phone], text pages welcome

## 2014-10-29 NOTE — Progress Notes (Signed)
Patient ID: Kristi [name], female   DOB: [birth year], 73 y.o.   MRN: [id number]  [facility] Progress Note    Assessment/ Plan:   1. Volume overload in patient with chronic kidney disease stage IV: Good urine output (2.3 L over the past 18 or so hours) in response to furosemide 80 mg IV twice a day. Clinically, she reports some improvement of her shortness of breath-currently on oxygen via nasal cannula saturating 97-100% on 2 L. Continue current dose of Lasix and attempt to ambulate the patient in order to gauge whether her exertional dyspnea is improving. 2. CHF exacerbation: She has diastolic heart failure with a preserved ejection fraction. With COPD and evidence of pulmonary hypertension per cardiology-ongoing cardiology evaluation/management as well. 3. Anemia: Anemia of chronic kidney disease, iron studies pending, current hemoglobin lower at 9.1, resume ESA. 4. History of chronic obstructive lung disease: No evidence of bronchospasm,on inhaled anticholinergics/bronchodilator therapy.  Subjective:   Reports to have had a fair night-no shortness of breath at rest, has not ambulated to see if her exertional dyspnea is better.    Objective:   BP 121/76 mmHg  Pulse 73  Temp(Src) 97.3 F (36.3 C) (Oral)  Resp 22  Ht 5' (1.524 m)  Wt 91.899 kg (202 lb 9.6 oz)  BMI 39.57 kg/m2  SpO2 96%  Intake/Output Summary (Last 24 hours) at 10/29/14 0813 Last data filed at 10/29/14 0457  Gross per 24 hour  Intake      0 ml  Output   2330 ml  Net  -2330 ml   Weight change:   Physical Exam: Gen: Comfortably resting in bed-flat CVS: Pulse regular in rate and rhythm, S1 and S2 normal Resp: Clear to auscultation-no rales/rhonchi Abd: Soft, obese, nontender Ext: 1-2+ lower extremity edema-comparatively better than yesterday  Imaging: Dg Chest 2 View  10/28/2014   CLINICAL DATA:  Shortness of breath and COPD. History of smoking. Chest pain.  EXAM: CHEST  2 VIEW  COMPARISON:   10/30/2012  FINDINGS: Coarse lung markings are similar to the previous examination. No focal airspace disease. Heart size is upper limits of normal but unchanged. Atherosclerotic calcifications at the aortic arch. No acute bone abnormality.  IMPRESSION: Stable chest radiograph findings.  No acute disease.   Electronically Signed   By: Markus Daft M.D.   On: 10/28/2014 13:29    Labs: BMET  Recent Labs Lab 10/28/14 1255 10/29/14 0338  NA 144 146  K 4.2 3.8  CL 106 108  CO2 18* 20  GLUCOSE 107* 63*  BUN 50* 53*  CREATININE 2.79* 2.97*  CALCIUM 9.9 9.3  PHOS  --  5.0*   CBC  Recent Labs Lab 10/28/14 1255 10/29/14 0338  WBC 7.3 4.5  HGB 10.6* 9.1*  HCT 33.4* 28.6*  MCV 92.8 92.3  PLT 207 178    Medications:    . allopurinol  200 mg Oral Daily  . amLODipine  5 mg Oral Daily  . antiseptic oral rinse  7 mL Mouth Rinse BID  . aspirin  325 mg Oral QHS  . furosemide  80 mg Intravenous BID  . heparin  5,000 Units Subcutaneous 3 times per day  . insulin aspart  0-5 Units Subcutaneous QHS  . insulin glargine  13 Units Subcutaneous QHS  . ipratropium-albuterol  3 mL Nebulization Q6H  . metoprolol succinate  12.5 mg Oral Daily  . omega-3 acid ethyl esters  1 g Oral Daily  . pantoprazole  40 mg Oral Daily  .  pravastatin  40 mg Oral QPM  . sodium chloride  3 mL Intravenous Q12H   Elmarie Shiley, MD 10/29/2014, 8:13 AM

## 2014-10-29 NOTE — Progress Notes (Signed)
Echo Lab  2D Echocardiogram completed.  [place], RDCS 10/29/2014 11:02 AM

## 2014-10-29 NOTE — Discharge Summary (Deleted)
[facility] Discharge Summary  Patient name: Kristi [name] Medical record number: [id number] Date of birth: July 05, 1941 Age: 73 y.o. Gender: female Date of Admission: 10/28/2014  Date of Discharge: 11/02/14  Admitting Physician: Andrena Mews, MD  Primary Care Provider: Elberta Leatherwood, MD Consultants: Cardiology, nephrology  Indication for Hospitalization: Dyspnea on exertion  Discharge Diagnoses/Problem List:  CHF exacerbation  Acute on chronic diastolic heart failure Pulmonary hypertension  CKD stage IV COPD HLD  Type 2 diabetes mellitus H/o DVT H/o CVA  Disposition: Home with HH-PT and oxygen  Discharge Condition: Stable, improved   Brief Hospital Course:  Kristi [name] is a 73 y/o with presenting with DOE that progressively worsened over 2 weeks. Initially in the ED she was hypoxic to 88% on RA and was noted to be tachypneic with increased WOB. She was placed on BiPAP x 2hrs but was eventually able to be transitioned to 2L [place].   #Dyspnea: On admission, nephrology and cardiology were consulted. CXR without acute changes, BNP elevated to 12,267, troponin negative x 3, EKG without ischemia/infarct. Nephrology initially concerned that she may require dialysis, however patient diuresed well with Lasix 80mg  IV BID which was eventually decreased to her home regimen of Lasix 80mg  PO daily. Repeat TTE revealed grade 3 diastolic dysfunction and increased PA pressure to 96. No pharmacologic intervention of her pulmonary HTN but order was placed for home O2 as she destatted to 87% on RA with ambulation. On the day of discharge, DOE had improved significantly and patient appeared euvolemic on exam.   #CKD: Creatinine at baseline of 2.63 on admission, however with aggressive diuresis Cr increased to 3.63 on the day of discharge. Nephrology aware of continue increase despite decrease in diuresis on the day of discharge, will need repeat Cr on 1 week hospital  f/u.  #COPD: Patient's home Spiriva initially held and placed on scheduled DuoNebs. Given concerns for worsening cough/wheezing, treated for a COPD exacerbation. On day 4/5 of steroids and 4/7 of doxycycline. Will be discharged home on [facility] as well.   #T2DM: Initially well controlled on home regimen of lantus, however expectedly had increase in CBGs with steroids. Given additional 5u Lantus on the day of discharge.   # HTN: Well controlled. Held home losartan given renal function.   #HLD: Initially switched to Lipitor, however patient currently in Medicare donut hole and unable to afford, therefore will continue home pravastatin.    Issues for Follow Up:  -- Repeat BMET at hospital f/u to assess creatinine function -- Consider re-starting losartan once renal function close to baseline -- Consider transition back to Lipitor once patient's is out of her Medicare gap -- Given patient's financial issues, consider samples from [facility] from clinic  Significant Procedures: None  Significant Labs and Imaging:   Recent Labs Lab 10/31/14 0320 11/01/14 0301 11/02/14 0431  WBC 7.3 9.0 9.0  HGB 9.9* 9.3* 9.5*  HCT 31.4* 28.9* 29.1*  PLT 178 173 206    Recent Labs Lab 10/28/14 1554 10/29/14 0338 10/30/14 0302 10/31/14 0320 11/01/14 0301 11/02/14 0431  NA  --  146 141 139 133* 135*  K  --  3.8 4.3 4.6 4.5 4.2  CL  --  108 103 99 92* 95*  CO2  --  20 21 20 22 21   GLUCOSE  --  63* 116* 239* 277* 286*  BUN  --  53* 54* 59* 77* 94*  CREATININE  --  2.97* 3.22* 3.25* 3.47* 3.63*  CALCIUM  --  9.3 9.3 9.3 9.1 8.8  MG 2.6* 2.5  --   --   --   --   PHOS  --  5.0*  --   --   --   --   ALBUMIN  --  2.9*  --   --   --   --   ProBNP 12,267 Troponin negative x 3  2 view chest x-ray 10/28/2014: Stable chest radiograph findings. No acute disease.  EKG with no discrete ischemic signs by with first-degree heart block, slight right axis deviation, and T-wave inversion in V4 through  V6  TTE: EF 60-65% . Grade 3 diastolic dysfunction. PA peak pressure 27mmHg  Results/Tests Pending at Time of Discharge: None  Discharge Medications:    Medication List    STOP taking these medications        losartan 25 MG tablet  Commonly known as:  COZAAR      TAKE these medications        acetaminophen 500 MG tablet  Commonly known as:  TYLENOL  Take 1,000 mg by mouth every 6 (six) hours as needed.     acetaminophen-codeine 300-30 MG per tablet  Commonly known as:  TYLENOL #3  1 tablet every 6 (six) hours as needed for moderate pain.     albuterol 108 (90 BASE) MCG/ACT inhaler  Commonly known as:  PROVENTIL HFA  Inhale 2 puffs into the lungs every 4 (four) hours as needed. For wheezing     allopurinol 100 MG tablet  Commonly known as:  ZYLOPRIM  Take 2 tablets (200 mg total) by mouth daily.     amLODipine 5 MG tablet  Commonly known as:  NORVASC  Take 1 tablet (5 mg total) by mouth daily.     aspirin 325 MG tablet  Take 325 mg by mouth at bedtime.     benzonatate 100 MG capsule  Commonly known as:  TESSALON  Take 1 capsule (100 mg total) by mouth 3 (three) times daily as needed for cough.     [employer] ALLERGY RELIEF 180 MG tablet  Generic drug:  fexofenadine  TAKE 1 TABLET BY MOUTH EVERY DAY     doxycycline 100 MG tablet  Commonly known as:  VIBRA-TABS  Take 1 tablet (100 mg total) by mouth every 12 (twelve) hours. Starting tonight     Fish Oil 1200 MG Caps  Take 2 capsules by mouth 2 (two) times daily.     furosemide 80 MG tablet  Commonly known as:  LASIX  Take 1 tablet (80 mg total) by mouth daily.     glipiZIDE 5 MG 24 hr tablet  Commonly known as:  GLUCOTROL XL  Take 1 tablet (5 mg total) by mouth daily with breakfast.     glucosamine-chondroitin 500-400 MG tablet  Take 1 tablet by mouth 2 (two) times daily.     HYDROcodone-acetaminophen 5-325 MG per tablet  Commonly known as:  NORCO/VICODIN  Take 1 tablet by mouth every 12 (twelve) hours as  needed for moderate pain.     insulin glargine 100 UNIT/ML injection  Commonly known as:  LANTUS  Inject 0.13 mLs (13 Units total) into the skin every morning. If blood sugar is >140 add 1 unit. If blood sugar is <140 subtract 1 unit.     metoprolol succinate 25 MG 24 hr tablet  Commonly known as:  TOPROL-XL  Take 0.5 tablets (12.5 mg total) by mouth daily.     mometasone-formoterol 100-5 MCG/ACT Aero  Commonly known  as:  DULERA  Inhale 2 puffs into the lungs 2 (two) times daily.     nitroGLYCERIN 0.4 MG SL tablet  Commonly known as:  NITROSTAT  Place 0.4 mg under the tongue every 5 (five) minutes as needed. For chest pain     Call 911 if pain not relieved by 3rd tab     omeprazole 20 MG capsule  Commonly known as:  PRILOSEC  TAKE ONE CAPSULE BY MOUTH AT BEDTIME     oxyCODONE-acetaminophen 5-325 MG per tablet  Commonly known as:  PERCOCET/ROXICET  Take 1 tablet by mouth 3 (three) times daily as needed for severe pain.     pravastatin 40 MG tablet  Commonly known as:  PRAVACHOL  Take 1 tablet (40 mg total) by mouth daily.     predniSONE 50 MG tablet  Commonly known as:  DELTASONE  Take 1 tablet (50 mg total) by mouth daily with breakfast.     SYSTANE OP  Apply 1-2 drops to eye 4 (four) times daily as needed. For dry eyes     Tiotropium Bromide Monohydrate 2.5 MCG/ACT Aers  Commonly known as:  SPIRIVA RESPIMAT  Inhale 2 puffs once daily     vitamin B-12 1000 MCG tablet  Commonly known as:  CYANOCOBALAMIN  Take 1,000 mcg by mouth every morning.        Discharge Instructions: Please refer to Patient Instructions section of EMR for full details.  Patient was counseled important signs and symptoms that should prompt return to medical care, changes in medications, dietary instructions, activity restrictions, and follow up appointments.   Follow-Up Appointments: Follow-up Information    Follow up with Truitt Merle, NP On 11/09/2014.   Specialty:  Nurse Practitioner    Why:  @ 2pm   Contact information:   [address] [place] [address] [phone]       Follow up with Kenn File, MD On 11/09/2014.   Specialty:  Family Medicine   Why:  at [place] for a hospital follow up   Contact information:   [place] [address] [phone]       Archie Patten, MD 11/02/2014, 12:14 PM PGY-1, [place]

## 2014-10-30 ENCOUNTER — Inpatient Hospital Stay (HOSPITAL_COMMUNITY): Payer: Medicare HMO

## 2014-10-30 LAB — CBC
HEMATOCRIT: 29.5 % — AB (ref 36.0–46.0)
HEMOGLOBIN: 9.4 g/dL — AB (ref 12.0–15.0)
MCH: 30.1 pg (ref 26.0–34.0)
MCHC: 31.9 g/dL (ref 30.0–36.0)
MCV: 94.6 fL (ref 78.0–100.0)
Platelets: 171 10*3/uL (ref 150–400)
RBC: 3.12 MIL/uL — AB (ref 3.87–5.11)
RDW: 17.4 % — ABNORMAL HIGH (ref 11.5–15.5)
WBC: 6.4 10*3/uL (ref 4.0–10.5)

## 2014-10-30 LAB — BASIC METABOLIC PANEL
Anion gap: 17 — ABNORMAL HIGH (ref 5–15)
BUN: 54 mg/dL — AB (ref 6–23)
CHLORIDE: 103 meq/L (ref 96–112)
CO2: 21 meq/L (ref 19–32)
CREATININE: 3.22 mg/dL — AB (ref 0.50–1.10)
Calcium: 9.3 mg/dL (ref 8.4–10.5)
GFR calc Af Amer: 15 mL/min — ABNORMAL LOW (ref >90)
GFR calc non Af Amer: 13 mL/min — ABNORMAL LOW (ref >90)
GLUCOSE: 116 mg/dL — AB (ref 70–99)
Potassium: 4.3 mEq/L (ref 3.7–5.3)
Sodium: 141 mEq/L (ref 137–147)

## 2014-10-30 LAB — GLUCOSE, CAPILLARY
Glucose-Capillary: 111 mg/dL — ABNORMAL HIGH (ref 70–99)
Glucose-Capillary: 159 mg/dL — ABNORMAL HIGH (ref 70–99)
Glucose-Capillary: 176 mg/dL — ABNORMAL HIGH (ref 70–99)
Glucose-Capillary: 187 mg/dL — ABNORMAL HIGH (ref 70–99)

## 2014-10-30 LAB — URINALYSIS, ROUTINE W REFLEX MICROSCOPIC
Bilirubin Urine: NEGATIVE
GLUCOSE, UA: NEGATIVE mg/dL
KETONES UR: NEGATIVE mg/dL
NITRITE: NEGATIVE
PROTEIN: 100 mg/dL — AB
Specific Gravity, Urine: 1.015 (ref 1.005–1.030)
UROBILINOGEN UA: 0.2 mg/dL (ref 0.0–1.0)
pH: 5 (ref 5.0–8.0)

## 2014-10-30 LAB — URINE MICROSCOPIC-ADD ON

## 2014-10-30 MED ORDER — INSULIN ASPART 100 UNIT/ML ~~LOC~~ SOLN
0.0000 [IU] | Freq: Every day | SUBCUTANEOUS | Status: DC
  Administered 2014-10-31: 3 [IU] via SUBCUTANEOUS

## 2014-10-30 MED ORDER — ALBUTEROL SULFATE (2.5 MG/3ML) 0.083% IN NEBU
2.5000 mg | INHALATION_SOLUTION | RESPIRATORY_TRACT | Status: DC | PRN
  Administered 2014-11-01 – 2014-11-02 (×2): 2.5 mg via RESPIRATORY_TRACT
  Filled 2014-10-30 (×2): qty 3

## 2014-10-30 MED ORDER — BENZONATATE 100 MG PO CAPS
100.0000 mg | ORAL_CAPSULE | Freq: Three times a day (TID) | ORAL | Status: DC | PRN
  Administered 2014-10-30 – 2014-11-02 (×6): 100 mg via ORAL
  Filled 2014-10-30 (×7): qty 1

## 2014-10-30 MED ORDER — DOXYCYCLINE HYCLATE 100 MG PO TABS
100.0000 mg | ORAL_TABLET | Freq: Two times a day (BID) | ORAL | Status: DC
  Administered 2014-10-30 – 2014-11-03 (×9): 100 mg via ORAL
  Filled 2014-10-30 (×10): qty 1

## 2014-10-30 MED ORDER — FUROSEMIDE 10 MG/ML IJ SOLN
40.0000 mg | Freq: Two times a day (BID) | INTRAMUSCULAR | Status: DC
  Administered 2014-10-30: 40 mg via INTRAVENOUS
  Filled 2014-10-30 (×2): qty 4

## 2014-10-30 MED ORDER — INSULIN ASPART 100 UNIT/ML ~~LOC~~ SOLN
0.0000 [IU] | Freq: Three times a day (TID) | SUBCUTANEOUS | Status: DC
  Administered 2014-10-30: 2 [IU] via SUBCUTANEOUS
  Administered 2014-10-31 (×2): 5 [IU] via SUBCUTANEOUS
  Administered 2014-10-31 – 2014-11-01 (×2): 3 [IU] via SUBCUTANEOUS

## 2014-10-30 MED ORDER — PREDNISONE 50 MG PO TABS
50.0000 mg | ORAL_TABLET | Freq: Every day | ORAL | Status: AC
Start: 2014-10-30 — End: 2014-11-03
  Administered 2014-10-30 – 2014-11-03 (×5): 50 mg via ORAL
  Filled 2014-10-30 (×5): qty 1

## 2014-10-30 MED ORDER — GUAIFENESIN 200 MG PO TABS
200.0000 mg | ORAL_TABLET | ORAL | Status: DC | PRN
  Administered 2014-11-02: 200 mg via ORAL
  Filled 2014-10-30 (×2): qty 1

## 2014-10-30 MED ORDER — WHITE PETROLATUM GEL
Status: AC
  Administered 2014-10-30: 1 via NASAL
  Filled 2014-10-30: qty 5

## 2014-10-30 MED ORDER — PREDNISONE 50 MG PO TABS
50.0000 mg | ORAL_TABLET | Freq: Every day | ORAL | Status: DC
  Filled 2014-10-30: qty 1

## 2014-10-30 MED ORDER — IPRATROPIUM-ALBUTEROL 0.5-2.5 (3) MG/3ML IN SOLN
3.0000 mL | Freq: Three times a day (TID) | RESPIRATORY_TRACT | Status: DC
  Administered 2014-10-30 – 2014-11-01 (×7): 3 mL via RESPIRATORY_TRACT
  Filled 2014-10-30 (×6): qty 3

## 2014-10-30 NOTE — Progress Notes (Addendum)
Subjective:  Appears sleepy. Congestive cough +Wheeze No CP.  Objective:  Vital Signs in the last 24 hours: Temp:  [97.8 F (36.6 C)-98.3 F (36.8 C)] 98.2 F (36.8 C) (11/22 0732) Pulse Rate:  [71-76] 74 (11/22 0732) Resp:  [19-25] 20 (11/22 0732) BP: (114-135)/(35-43) 131/41 mmHg (11/22 0732) SpO2:  [92 %-99 %] 99 % (11/22 0940) Weight:  [202 lb 9.6 oz (91.9 kg)] 202 lb 9.6 oz (91.9 kg) (11/22 0406)  Intake/Output from previous day: 11/21 0701 - 11/22 0700 In: 480 [P.O.:480] Out: 3775 [Urine:3775]   Physical Exam: General: Well developed, well nourished, in no acute distress. Head:  Normocephalic and atraumatic. Lungs: +wheeze. Heart: Normal S1 and S2.  Soft S murmur,no rubs or gallops. Distant HS Abdomen: soft, non-tender, positive bowel sounds. Extremities: No clubbing or cyanosis. 1-2+BLE edema. Fistula in place Neurologic: Alert and oriented x 3.    Lab Results:  Recent Labs  10/29/14 0338 10/30/14 0302  WBC 4.5 6.4  HGB 9.1* 9.4*  PLT 178 171    Recent Labs  10/29/14 0338 10/30/14 0302  NA 146 141  K 3.8 4.3  CL 108 103  CO2 20 21  GLUCOSE 63* 116*  BUN 53* 54*  CREATININE 2.97* 3.22*    Recent Labs  10/28/14 2140 10/29/14 0338  TROPONINI <0.30 <0.30   Hepatic Function Panel  Recent Labs  10/29/14 0338  ALBUMIN 2.9*    Imaging: Dg Chest 2 View  10/28/2014   CLINICAL DATA:  Shortness of breath and COPD. History of smoking. Chest pain.  EXAM: CHEST  2 VIEW  COMPARISON:  10/30/2012  FINDINGS: Coarse lung markings are similar to the previous examination. No focal airspace disease. Heart size is upper limits of normal but unchanged. Atherosclerotic calcifications at the aortic arch. No acute bone abnormality.  IMPRESSION: Stable chest radiograph findings.  No acute disease.   Electronically Signed   By: Markus Daft M.D.   On: 10/28/2014 13:29    Telemetry: No adverse rhythms Personally viewed.   EKG:  10/29/14 - NSR with first degree  AVB, NSSTW changes  Cardiac Studies:  ECHO 2015 - normal EF, severe pHTN 69mmHg  Scheduled Meds: . allopurinol  200 mg Oral Daily  . amLODipine  5 mg Oral Daily  . antiseptic oral rinse  7 mL Mouth Rinse BID  . aspirin  325 mg Oral QHS  . atorvastatin  20 mg Oral q1800  . darbepoetin (ARANESP) injection - NON-DIALYSIS  100 mcg Subcutaneous Q Sat-1800  . doxycycline  100 mg Oral Q12H  . furosemide  40 mg Intravenous BID  . heparin  5,000 Units Subcutaneous 3 times per day  . insulin aspart  0-5 Units Subcutaneous QHS  . insulin glargine  13 Units Subcutaneous QHS  . ipratropium-albuterol  3 mL Nebulization TID  . metoprolol succinate  12.5 mg Oral Daily  . omega-3 acid ethyl esters  1 g Oral Daily  . pantoprazole  40 mg Oral Daily  . [START ON 10/31/2014] predniSONE  50 mg Oral Q breakfast  . sodium chloride  3 mL Intravenous Q12H   Continuous Infusions:  PRN Meds:.acetaminophen, benzonatate, oxyCODONE-acetaminophen  Assessment/Plan:  Principal Problem:   Volume overload Active Problems:   CHF exacerbation   COLD (chronic obstructive lung disease)   Essential hypertension, benign   Type 2 diabetes mellitus with complication   Chronic kidney disease (CKD), stage IV (severe)   Acute on chronic diastolic HF (heart failure)  Acute diastolic heart failure -  BNP 12000 - ECHO - EF 65% with elevated left atrial filling pressure - -5.6 L with IV lasix, decreased to 40 BID with creat increase.  Continue. Will plan on PO tomorrow. - Renal following-decreased lasix from 80 to 40 IV BID - Shortness of breath from HFpEF, COPD, PHTN, deconditioning. Home MDI's recommended - Encourage dietary compliance.   CAD - STEMI in July 2008  COPD - per primary team  CKD 4 - Dr. Posey Pronto note reviewed - Creat slightly increased. 2.79>>2.97>>3.22 - Good overall response to lasix. Decreased to 40 BID  Anemia noted  HL - pravastatin  Will follow.   Tonna Palazzi, [place] 10/30/2014, 10:40  AM

## 2014-10-30 NOTE — Progress Notes (Signed)
Patient ID: Kristi [name], female   DOB: [birth year], 73 y.o.   MRN: [id number]  [facility] Progress Note    Assessment/ Plan:   1. Volume overload in patient with chronic kidney disease stage IV: Over the past 24 hours, she has had an excellent response to furosemide 80 mg IV twice a day. Rising BUN/creatinine are likely from RAS activation of aggressive diuretic therapy versus subtle intravascular volume contraction. I will decrease furosemide now to 40 mg IV twice a day with the plan to convert to oral therapy tomorrow . She reports a lot of coughing overnight and some shortness of breath along with it now with pain around her rib cage.  2. CHF exacerbation: She has diastolic heart failure with a preserved ejection fraction. With COPD and evidence of pulmonary hypertension per cardiology-ongoing cardiology evaluation/management as well. 3. Anemia: Anemia of chronic kidney disease, iron studies pending, current hemoglobin 9.4, resume ESA. 4. History of chronic obstructive lung disease: She has evidence of bronchospasm today on physical exam-wheezing/rhonchi bilaterally and surprisingly/unfortunately not started on her bronchodilator therapy upon admission-I recommend starting this promptly.   Subjective:   Reports to have had a rough night with a lot of coughing and shortness of breath. Now has pain around her rib cage.   Objective:   BP 131/41 mmHg  Pulse 74  Temp(Src) 98.2 F (36.8 C) (Oral)  Resp 20  Ht 5' (1.524 m)  Wt 91.9 kg (202 lb 9.6 oz)  BMI 39.57 kg/m2  SpO2 97%  Intake/Output Summary (Last 24 hours) at 10/30/14 8099 Last data filed at 10/30/14 0700  Gross per 24 hour  Intake    360 ml  Output   3775 ml  Net  -3415 ml   Weight change: 3.448 kg (7 lb 9.6 oz)  Physical Exam: IPJ:[id number] resting in bed, partially eaten breakfast  PFX:TKWIO regular in rate and rhythm, S1 and S2 normal  Resp:Expiratory wheeze/rhonchi bilaterally-no rales  XBD:ZHGD,  obese, nontender  Ext:1-2+ lower extremity edema  Imaging: Dg Chest 2 View  10/28/2014   CLINICAL DATA:  Shortness of breath and COPD. History of smoking. Chest pain.  EXAM: CHEST  2 VIEW  COMPARISON:  10/30/2012  FINDINGS: Coarse lung markings are similar to the previous examination. No focal airspace disease. Heart size is upper limits of normal but unchanged. Atherosclerotic calcifications at the aortic arch. No acute bone abnormality.  IMPRESSION: Stable chest radiograph findings.  No acute disease.   Electronically Signed   By: Markus Daft M.D.   On: 10/28/2014 13:29    Labs: BMET  Recent Labs Lab 10/28/14 1255 10/29/14 0338 10/30/14 0302  NA 144 146 141  K 4.2 3.8 4.3  CL 106 108 103  CO2 18* 20 21  GLUCOSE 107* 63* 116*  BUN 50* 53* 54*  CREATININE 2.79* 2.97* 3.22*  CALCIUM 9.9 9.3 9.3  PHOS  --  5.0*  --    CBC  Recent Labs Lab 10/28/14 1255 10/29/14 0338 10/30/14 0302  WBC 7.3 4.5 6.4  HGB 10.6* 9.1* 9.4*  HCT 33.4* 28.6* 29.5*  MCV 92.8 92.3 94.6  PLT 207 178 171    Medications:    . allopurinol  200 mg Oral Daily  . amLODipine  5 mg Oral Daily  . antiseptic oral rinse  7 mL Mouth Rinse BID  . aspirin  325 mg Oral QHS  . atorvastatin  20 mg Oral q1800  . darbepoetin (ARANESP) injection - NON-DIALYSIS  100 mcg Subcutaneous  Q Sat-1800  . furosemide  80 mg Intravenous BID  . heparin  5,000 Units Subcutaneous 3 times per day  . insulin aspart  0-5 Units Subcutaneous QHS  . insulin glargine  13 Units Subcutaneous QHS  . ipratropium-albuterol  3 mL Nebulization TID  . metoprolol succinate  12.5 mg Oral Daily  . omega-3 acid ethyl esters  1 g Oral Daily  . pantoprazole  40 mg Oral Daily  . sodium chloride  3 mL Intravenous Q12H   Elmarie Shiley, MD 10/30/2014, 8:32 AM

## 2014-10-30 NOTE — Progress Notes (Signed)
Family Medicine Teaching Service Daily Progress Note Intern Pager: [phone]  Patient name: Kristi [name] Medical record number: [id number] Date of birth: 1941/07/17 Age: 73 y.o. Gender: female  Primary Care Provider: Elberta Leatherwood, MD Consultants: Nephrology, cardiology  Code Status: Full   Pt Overview and Major Events to Date:  11/20: Admitted with DOE x 2 weeks found to be fluid overloaded.  Assessment and Plan: Kristi [name] is a 73 y.o. female presenting with progressive dyspnea x 2 weeks found to be volume overloaded. PMH is significant for CKD stage IV, diastolic CHF, HTN, T2 DM, COPD, DVT, CVA hyperlipidemia.  #DyspneaVolume overload, CHF exacerbation - likely multifactorial with volume overload seemingly the primary etiology, also concern for COPD exacerbation. - MI ruled out - Repeat Echo with normal EF, Grade 3 diastolic dysf and severely increased PA pressure - Patient weaned from BiPAP to 2L [place]; no respiratory symptoms currently - Lasix 80mg  IV BID with good response, -5.6 overall - Nephrology consulted, appreciate recommendations - Cardiology consulted, appreciate recommendations  - Strict I&Os and daily weights - Cre up more to 3.22 from 2.7 two days ago - Daily renal function panel  #CKD, stage IV: Thought to secondary to diabetes/HTN. Follows with Dr. Lorrene Reid. Baseline creatinine appears to be between 2.7-3.0 - Nephrology consulted, appreciate recommendations  - Acutely up due to diuresis - Creatinine 2.97 this AM, worsening most likely 2/2 Lasix  - Patient may require dialysis at some point  - Continue to monitor renal function   #COPD, COPD exacerbation - repeat CXR this am - now with wheezing and coughing, adding prednisone and doxy - DuoNebs q6hrs, Held spiriva as I am giving duonebs (ipratropium in place of tiotropium)  #T2 DM - Well-controlled A1c 6.7 - Hold home glipizide - Home Lantus 13u daily  - CBGs 68-194   #HTN - Well-controlled overnight  -  Continue home amlodipine, metoprolol - Hold losartan with renal stress due to Lasix  #Hyperlipidemia - On pravastatin currently - With risk factors, could consider changing to a high intensity statin   #Anemia: Most likely anemia of CKD. - Iron panel pending  - Supplement with iron as needed  FEN/GI: Heart healthy/carb modified, saline lock IV Prophylaxis: Subcutaneous heparin  Disposition: Pending improvement in diuresis/symptoms   Subjective:  Breathing marginally improved. Developing worsening cough with "rib pain" when she is coughing. Overall feeling unwell.   Objective: Temp:  [97.8 F (36.6 C)-98.3 F (36.8 C)] 98.2 F (36.8 C) (11/22 0732) Pulse Rate:  [71-76] 74 (11/22 0732) Resp:  [19-25] 20 (11/22 0732) BP: (114-135)/(35-43) 131/41 mmHg (11/22 0732) SpO2:  [92 %-98 %] 97 % (11/22 0732) Weight:  [202 lb 9.6 oz (91.9 kg)] 202 lb 9.6 oz (91.9 kg) (11/22 0406)    Intake/Output Summary (Last 24 hours) at 10/30/14 0837 Last data filed at 10/30/14 0700  Gross per 24 hour  Intake    360 ml  Output   3775 ml  Net  -3415 ml    Physical Exam: General: Lying flat in bed with 1 pillow in NAD Cardiovascular: RRR, no m/r/g noted. No JVD. 1+ DP and radial pulses bilaterally  Respiratory: diffuse exp wheezess, no crackles, normal WOB after coughing fit  Abdomen: +BS, soft, ND/NT Extremities: No deformities. Edema improving, 1+ BL Neuro: AXO, no gross abnormalities  Laboratory:  Recent Labs Lab 10/28/14 1255 10/29/14 0338 10/30/14 0302  WBC 7.3 4.5 6.4  HGB 10.6* 9.1* 9.4*  HCT 33.4* 28.6* 29.5*  PLT 207 178 171  Recent Labs Lab 10/28/14 1255 10/29/14 0338 10/30/14 0302  NA 144 146 141  K 4.2 3.8 4.3  CL 106 108 103  CO2 18* 20 21  BUN 50* 53* 54*  CREATININE 2.79* 2.97* 3.22*  CALCIUM 9.9 9.3 9.3  GLUCOSE 107* 63* 116*   ProBNP 12,267 Point-of-care troponin 0.01  Imaging/Diagnostic Tests: 2 view chest x-ray 10/28/2014: Stable chest radiograph  findings. No acute disease.  EKG with no discrete ischemic signs by with first-degree heart block, slight right axis deviation, and T-wave inversion in V4 through V6  Timmothy Euler, MD 10/30/2014, 8:37 AM PGY-3, [place] Intern pager: [phone], text pages welcome

## 2014-10-30 NOTE — Progress Notes (Signed)
MD notified presence of blood clots and amber/brown colored urine. UA ordered. Will continue to monitor.

## 2014-10-30 NOTE — Progress Notes (Signed)
Utilization Review Completed.Kristi [name] T11/22/2015  

## 2014-10-31 ENCOUNTER — Encounter (HOSPITAL_COMMUNITY): Payer: Self-pay | Admitting: Family Medicine

## 2014-10-31 DIAGNOSIS — J441 Chronic obstructive pulmonary disease with (acute) exacerbation: Secondary | ICD-10-CM

## 2014-10-31 DIAGNOSIS — I272 Other secondary pulmonary hypertension: Secondary | ICD-10-CM

## 2014-10-31 DIAGNOSIS — J439 Emphysema, unspecified: Secondary | ICD-10-CM

## 2014-10-31 DIAGNOSIS — I2721 Secondary pulmonary arterial hypertension: Secondary | ICD-10-CM

## 2014-10-31 HISTORY — DX: Secondary pulmonary arterial hypertension: I27.21

## 2014-10-31 LAB — BASIC METABOLIC PANEL
Anion gap: 20 — ABNORMAL HIGH (ref 5–15)
BUN: 59 mg/dL — ABNORMAL HIGH (ref 6–23)
CHLORIDE: 99 meq/L (ref 96–112)
CO2: 20 meq/L (ref 19–32)
Calcium: 9.3 mg/dL (ref 8.4–10.5)
Creatinine, Ser: 3.25 mg/dL — ABNORMAL HIGH (ref 0.50–1.10)
GFR calc Af Amer: 15 mL/min — ABNORMAL LOW (ref >90)
GFR calc non Af Amer: 13 mL/min — ABNORMAL LOW (ref >90)
Glucose, Bld: 239 mg/dL — ABNORMAL HIGH (ref 70–99)
Potassium: 4.6 mEq/L (ref 3.7–5.3)
Sodium: 139 mEq/L (ref 137–147)

## 2014-10-31 LAB — CBC
HEMATOCRIT: 31.4 % — AB (ref 36.0–46.0)
HEMOGLOBIN: 9.9 g/dL — AB (ref 12.0–15.0)
MCH: 29.6 pg (ref 26.0–34.0)
MCHC: 31.5 g/dL (ref 30.0–36.0)
MCV: 93.7 fL (ref 78.0–100.0)
Platelets: 178 10*3/uL (ref 150–400)
RBC: 3.35 MIL/uL — ABNORMAL LOW (ref 3.87–5.11)
RDW: 16.9 % — ABNORMAL HIGH (ref 11.5–15.5)
WBC: 7.3 10*3/uL (ref 4.0–10.5)

## 2014-10-31 LAB — GLUCOSE, CAPILLARY
GLUCOSE-CAPILLARY: 290 mg/dL — AB (ref 70–99)
Glucose-Capillary: 244 mg/dL — ABNORMAL HIGH (ref 70–99)
Glucose-Capillary: 265 mg/dL — ABNORMAL HIGH (ref 70–99)
Glucose-Capillary: 293 mg/dL — ABNORMAL HIGH (ref 70–99)

## 2014-10-31 LAB — IRON AND TIBC
IRON: 30 ug/dL — AB (ref 42–135)
IRON: 15 ug/dL — AB (ref 42–135)
SATURATION RATIOS: 9 % — AB (ref 20–55)
SATURATION RATIOS: 4 % — AB (ref 20–55)
TIBC: 350 ug/dL (ref 250–470)
TIBC: 356 ug/dL (ref 250–470)
UIBC: 320 ug/dL (ref 125–400)
UIBC: 341 ug/dL (ref 125–400)

## 2014-10-31 MED ORDER — FUROSEMIDE 80 MG PO TABS
80.0000 mg | ORAL_TABLET | Freq: Two times a day (BID) | ORAL | Status: DC
  Administered 2014-10-31 – 2014-11-01 (×3): 80 mg via ORAL
  Filled 2014-10-31: qty 1
  Filled 2014-10-31: qty 2
  Filled 2014-10-31: qty 1
  Filled 2014-10-31: qty 2
  Filled 2014-10-31 (×3): qty 1

## 2014-10-31 MED ORDER — MOMETASONE FURO-FORMOTEROL FUM 100-5 MCG/ACT IN AERO
2.0000 | INHALATION_SPRAY | Freq: Two times a day (BID) | RESPIRATORY_TRACT | Status: DC
  Administered 2014-10-31 – 2014-11-03 (×5): 2 via RESPIRATORY_TRACT
  Filled 2014-10-31 (×2): qty 8.8

## 2014-10-31 MED ORDER — ATORVASTATIN CALCIUM 40 MG PO TABS
40.0000 mg | ORAL_TABLET | Freq: Every day | ORAL | Status: DC
  Administered 2014-10-31 – 2014-11-02 (×3): 40 mg via ORAL
  Filled 2014-10-31 (×4): qty 1

## 2014-10-31 NOTE — Progress Notes (Signed)
SUBJECTIVE:  Cough resolved. Breathing improved.  No SOB, no chest pain, no complaints.  OBJECTIVE:   Vitals:   Filed Vitals:   10/31/14 0700 10/31/14 0855 10/31/14 0910 10/31/14 1227  BP: 124/39  118/47 113/36  Pulse: 67  76 76  Temp: 97.6 F (36.4 C)   97.7 F (36.5 C)  TempSrc: Oral   Oral  Resp: 18   19  Height:      Weight:      SpO2: 95% 99%  94%   I&O's:   Intake/Output Summary (Last 24 hours) at 10/31/14 1302 Last data filed at 10/31/14 0800  Gross per 24 hour  Intake    390 ml  Output    995 ml  Net   -605 ml   TELEMETRY: Reviewed telemetry pt in NSR:     PHYSICAL EXAM General: Well developed, well nourished, in no acute distress  Head:   Normal cephalic and atramatic  Lungs:  Clear bilaterally to auscultation. No wheezing or rales Heart:  HRRR S1 S2  No appreciated JVD.   Abdomen: abdomen soft and non-tender Msk:  Back normal,  Normal strength and tone for age. Extremities:  No edema.   Neuro: Alert and oriented. Psych:  Normal affect, responds appropriately Skin: No rash   LABS: Basic Metabolic Panel:  Recent Labs  10/28/14 1554  10/29/14 0338 10/30/14 0302 10/31/14 0320  NA  --   < > 146 141 139  K  --   < > 3.8 4.3 4.6  CL  --   < > 108 103 99  CO2  --   < > 20 21 20   GLUCOSE  --   < > 63* 116* 239*  BUN  --   < > 53* 54* 59*  CREATININE  --   < > 2.97* 3.22* 3.25*  CALCIUM  --   < > 9.3 9.3 9.3  MG 2.6*  --  2.5  --   --   PHOS  --   --  5.0*  --   --   < > = values in this interval not displayed. Liver Function Tests:  Recent Labs  10/29/14 0338  ALBUMIN 2.9*   No results for input(s): LIPASE, AMYLASE in the last 72 hours. CBC:  Recent Labs  10/30/14 0302 10/31/14 0320  WBC 6.4 7.3  HGB 9.4* 9.9*  HCT 29.5* 31.4*  MCV 94.6 93.7  PLT 171 178   Cardiac Enzymes:  Recent Labs  10/28/14 1554 10/28/14 2140 10/29/14 0338  TROPONINI <0.30 <0.30 <0.30   BNP: Invalid input(s): POCBNP D-Dimer: No results for  input(s): DDIMER in the last 72 hours. Hemoglobin A1C: No results for input(s): HGBA1C in the last 72 hours. Fasting Lipid Panel: No results for input(s): CHOL, HDL, LDLCALC, TRIG, CHOLHDL, LDLDIRECT in the last 72 hours. Thyroid Function Tests: No results for input(s): TSH, T4TOTAL, T3FREE, THYROIDAB in the last 72 hours.  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  Atherosclerotic calcification aorta.  No definite acute infiltrate or pneumothorax.  Tiny effusions blunt the posterior costophrenic angles.  Bones demineralized.  Numerous EKG leads project over chest.  IMPRESSION: Enlargement of cardiac silhouette with pulmonary vascular congestion.  Tiny bibasilar pleural effusions.   Electronically Signed   By: Lavonia Dana M.D.   On: 10/30/2014 13:33   Dg Chest 2 View  10/28/2014   CLINICAL DATA:  Shortness of breath and COPD. History of smoking. Chest pain.  EXAM: CHEST  2 VIEW  COMPARISON:  10/30/2012  FINDINGS: Coarse lung markings are similar to the previous examination. No focal airspace disease. Heart size is upper limits of normal but unchanged. Atherosclerotic calcifications at the aortic arch. No acute bone abnormality.  IMPRESSION: Stable chest radiograph findings.  No acute disease.   Electronically Signed   By: Markus Daft M.D.   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  Patient appears to be  nearing euvolemia and her SOB/DOE has greatly improved.  She has been transitioned to Lasix 80mg  PO BID.  At home she was taking 80mg  daily and reports that her weight gain was a slow process over 2 weeks.  She will likely need instructions for PRN lasix on discharge. - Stable from cardiac standpoint for transfer out of step down unit.    COLD (chronic obstructive lung disease) - Improved - Per primary    Essential hypertension, benign - Well controlled    Type 2 diabetes mellitus with complication -Mildly hyperglycemic on prednisone - Management per primary    Chronic kidney disease (CKD), stage IV (severe) - Baseline SCr ~2.8, with diuresis trended up to 3.2.  Nephrology is following.     Lucious Groves, DO  10/31/2014  1:02 PM   History and all data above reviewed.  Patient examined.  I agree with the findings as above.  Breathing better.  The patient exam reveals COR:RRR  ,  Lungs: Clear  ,  Abd: Positive bowel sounds, no rebound no guarding, Ext Trace edema  .  All available labs, radiology testing, previous records reviewed. Agree with documented assessment and plan. Diastolic HF:  Long education session today about daily weights, salt restriction and close follow up of volume and creat.    Minus Breeding  6:39 PM  10/31/2014

## 2014-10-31 NOTE — Care Management Note (Addendum)
    Page 1 of 2   11/02/2014     5:00:56 PM CARE MANAGEMENT NOTE 11/02/2014  Patient:  Kristi [name], Kristi [name]   Account Number:  [id number]  Date Initiated:  10/31/2014  Documentation initiated by:  Elissa Hefty  Subjective/Objective Assessment:   adm w volunme overload     Action/Plan:   lives w fam , pcp dr Georges Lynch   Anticipated DC Date:  11/02/2014   Anticipated DC Plan:  [place]  CM consult      [place]   Choice offered to / List presented to:  C-1 Patient   DME arranged  [facility]      DME agency  [place] arranged  HH-2 PT  HH-1 RN  HH-10 DISEASE MANAGEMENT      [place] agency  [place]   Status of service:  Completed, signed off Medicare Important Message given?  YES (If response is "NO", the following Medicare IM given date fields will be blank) Date Medicare IM given:  10/31/2014 Medicare IM given by:  Elissa Hefty Date Additional Medicare IM given:  11/02/2014 Additional Medicare IM given by:  Ellan Lambert  Discharge Disposition:  [place]  Per UR Regulation:  Reviewed for med. necessity/level of care/duration of stay  If discussed at [place] of Stay Meetings, dates discussed:    Comments:  11/02/14 Ellan Lambert, RN, BSN [phone] Pt for dc later today, and will need home oxygen and HH follow up.  Referral to [facility] for oxygen set up; portable tank to be delivered to pt's room prior to dc.  Referral to [facility] per pt choice. Start of care 24-48h post dc date.

## 2014-10-31 NOTE — Progress Notes (Signed)
MD paged about medications that should be given pre TEE and cardioversion. Pharmacy called and notified, medications moved and required medications given this am. Pt with no complaints at this time, resting comfortably.

## 2014-10-31 NOTE — Progress Notes (Signed)
[facility] ROUNDING NOTE   Subjective:   Interval History: no compliants  breathing and edema better  Objective:  Vital signs in last 24 hours:  Temp:  [97.4 F (36.3 C)-99.1 F (37.3 C)] 97.6 F (36.4 C) (11/23 0700) Pulse Rate:  [67-89] 76 (11/23 0910) Resp:  [17-30] 18 (11/23 0700) BP: (116-145)/(38-59) 118/47 mmHg (11/23 0910) SpO2:  [92 %-99 %] 99 % (11/23 0855) Weight:  [87.7 kg (193 lb 5.5 oz)] 87.7 kg (193 lb 5.5 oz) (11/23 0300)  Weight change: -4.2 kg (-9 lb 4.2 oz) Filed Weights   10/29/14 0300 10/30/14 0406 10/31/14 0300  Weight: 91.899 kg (202 lb 9.6 oz) 91.9 kg (202 lb 9.6 oz) 87.7 kg (193 lb 5.5 oz)    Intake/Output: I/O last 3 completed shifts: In: 600 [P.O.:600] Out: 3830 [Urine:3830]   Intake/Output this shift:  Total I/O In: 150 [P.O.:150] Out: 40 [Urine:40]  CVS- RRR RS- CTA ABD- BS present soft non-distended EXT- no edema   Basic Metabolic Panel:  Recent Labs Lab 10/28/14 1255 10/28/14 1554 10/29/14 0338 10/30/14 0302 10/31/14 0320  NA 144  --  146 141 139  K 4.2  --  3.8 4.3 4.6  CL 106  --  108 103 99  CO2 18*  --  20 21 20   GLUCOSE 107*  --  63* 116* 239*  BUN 50*  --  53* 54* 59*  CREATININE 2.79*  --  2.97* 3.22* 3.25*  CALCIUM 9.9  --  9.3 9.3 9.3  MG  --  2.6* 2.5  --   --   PHOS  --   --  5.0*  --   --     Liver Function Tests:  Recent Labs Lab 10/29/14 0338  ALBUMIN 2.9*   No results for input(s): LIPASE, AMYLASE in the last 168 hours. No results for input(s): AMMONIA in the last 168 hours.  CBC:  Recent Labs Lab 10/28/14 1255 10/29/14 0338 10/30/14 0302 10/31/14 0320  WBC 7.3 4.5 6.4 7.3  HGB 10.6* 9.1* 9.4* 9.9*  HCT 33.4* 28.6* 29.5* 31.4*  MCV 92.8 92.3 94.6 93.7  PLT 207 178 171 178    Cardiac Enzymes:  Recent Labs Lab 10/28/14 1554 10/28/14 2140 10/29/14 0338  TROPONINI <0.30 <0.30 <0.30    BNP: Invalid input(s): POCBNP  CBG:  Recent Labs Lab 10/30/14 0735  10/30/14 1156 10/30/14 1553 10/30/14 2111 10/31/14 0752  GLUCAP 111* 159* 176* 187* 244*    Microbiology: Results for orders placed or performed during the hospital encounter of 10/28/14  MRSA PCR Screening     Status: None   Collection Time: 10/28/14  4:54 PM  Result Value Ref Range Status   MRSA by PCR NEGATIVE NEGATIVE Final    Comment:        The GeneXpert MRSA Assay (FDA approved for NASAL specimens only), is one component of a comprehensive MRSA colonization surveillance program. It is not intended to diagnose MRSA infection nor to guide or monitor treatment for MRSA infections.     Coagulation Studies: No results for input(s): LABPROT, INR in the last 72 hours.  Urinalysis:  Recent Labs  10/28/14 1908 10/30/14 2323  COLORURINE YELLOW YELLOW  LABSPEC 1.009 1.015  PHURINE 5.0 5.0  GLUCOSEU NEGATIVE NEGATIVE  HGBUR NEGATIVE LARGE*  BILIRUBINUR NEGATIVE NEGATIVE  KETONESUR NEGATIVE NEGATIVE  PROTEINUR 30* 100*  UROBILINOGEN 0.2 0.2  NITRITE NEGATIVE NEGATIVE  LEUKOCYTESUR SMALL* SMALL*      Imaging: Dg Chest 2 View  10/30/2014  CLINICAL DATA:  Coughing up mucus, shortness of breath which began 2 weeks ago, history COPD, CHF, GERD, hypertension, diabetes, 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  Atherosclerotic calcification aorta.  No definite acute infiltrate or pneumothorax.  Tiny effusions blunt the posterior costophrenic angles.  Bones demineralized.  Numerous EKG leads project over chest.  IMPRESSION: Enlargement of cardiac silhouette with pulmonary vascular congestion.  Tiny bibasilar pleural effusions.   Electronically Signed   By: Lavonia Dana M.D.   On: 10/30/2014 13:33     Medications:     . allopurinol  200 mg Oral Daily  . amLODipine  5 mg Oral Daily  . antiseptic oral rinse  7 mL Mouth Rinse BID  . aspirin  325  mg Oral QHS  . atorvastatin  40 mg Oral q1800  . darbepoetin (ARANESP) injection - NON-DIALYSIS  100 mcg Subcutaneous Q Sat-1800  . doxycycline  100 mg Oral Q12H  . furosemide  80 mg Oral BID  . heparin  5,000 Units Subcutaneous 3 times per day  . insulin aspart  0-5 Units Subcutaneous QHS  . insulin aspart  0-9 Units Subcutaneous TID WC  . insulin glargine  13 Units Subcutaneous QHS  . ipratropium-albuterol  3 mL Nebulization TID  . metoprolol succinate  12.5 mg Oral Daily  . omega-3 acid ethyl esters  1 g Oral Daily  . pantoprazole  40 mg Oral Daily  . predniSONE  50 mg Oral Q breakfast  . sodium chloride  3 mL Intravenous Q12H   acetaminophen, albuterol, benzonatate, guaiFENesin, oxyCODONE-acetaminophen  Assessment/ Plan:  1. Volume overload in patient with chronic kidney disease stage IV:  Will convert to 80mg  BID  2. CHF exacerbation: She has diastolic heart failure with a preserved ejection fraction. With COPD and evidence of pulmonary hypertension per cardiology-ongoing cardiology evaluation/management as well. 3. Anemia: Anemia of chronic kidney disease, iron studies ordered , current hemoglobin 9 's , resumed ESA. 4. History of chronic obstructive lung disease: She has evidence of bronchospasm today on physical exam-wheezing/rhonchi bilaterally and surprisingly/unfortunately not started on her bronchodilator therapy upon admission-I recommend starting this promptly.     LOS: 3 Kinnick Maus W @TODAY @11 :40 AM

## 2014-10-31 NOTE — Progress Notes (Signed)
Pt stated she did not want to wear Bipap for the night, RT will continue to monitor.

## 2014-10-31 NOTE — Progress Notes (Signed)
Pt ambulated in hallway aprox 252ft. Pt started to get SOB towards end of walk with sats in the upper 80's low 90's on 2L [place]. Will continue to monitor and try to ambulate pt this afternoon after her dinner.

## 2014-10-31 NOTE — Progress Notes (Signed)
Family Medicine Teaching Service Daily Progress Note Intern Pager: [phone]  Patient name: Kristi [name] Medical record number: [id number] Date of birth: 12-Mar-1941 Age: 73 y.o. Gender: female  Primary Care Provider: Elberta Leatherwood, MD Consultants: Nephrology, cardiology  Code Status: Full   Pt Overview and Major Events to Date:  11/20: Admitted with DOE x 2 weeks found to be fluid overloaded. Placed on BiPAP but able to be weaned to 2L [place] 11/22: Added Prednisone and doxy for ?COPD exacerbation. CXR obtained; decreased to Lasix 40mg  due to elevated creatinine 11/23: Transition to PO Lasix   Assessment and Plan: Kristi [name] is a 73 y.o. female presenting with progressive dyspnea x 2 weeks found to be volume overloaded. PMH is significant for CKD stage IV, diastolic CHF, HTN, T2 DM, COPD, DVT, CVA hyperlipidemia.  #DyspneaVolume overload, CHF exacerbation: likely multifactorial with volume overload seemingly the primary etiology, also concern for COPD exacerbation. - MI ruled out - Repeat Echo with normal EF, Grade 3 diastolic dysf and severely increased PA pressure: will defer to cardiology about the possibility of starting something such as sildenafil - Stable on 2L ; no respiratory symptoms currently - Lasix 80mg  IV BID decreased to 40mg  IV BID with good response, -6.9L overall - Will transition to Lasix 80mg  PO BID per nephrology note on 11/22. - Nephrology consulted, appreciate recommendations - Cardiology consulted, appreciate recommendations  - Strict I&Os and daily weights - Cre up more to 3.25 from 2.7 three days ago - Daily renal function panel - Will order PT   #CKD, stage IV: Thought to secondary to diabetes/HTN. Follows with Dr. Lorrene [name]. Baseline creatinine appears to be between 2.7-3.0 - Nephrology consulted, appreciate recommendations  - Creatinine acutely up due to diuresis - Patient may require dialysis at some point  - Continue to monitor renal function   #COPD,  COPD exacerbation: Repeat CXR revealed enlargement of cardiac silhouette with pulmonary vascular congestion and tiny bibasilar pleural effusions. - now with wheezing and coughing, adding prednisone and doxycycline  - DuoNebs q6hrs -  Held spiriva as she's getting duonebs (ipratropium in place of tiotropium)   #T2 DM - Well-controlled A1c 6.7 - Hold home glipizide - Home Lantus 13u daily  - Expect CBGs to be more elevated in the setting of steroids - CBGs 111-239   #HTN - Well-controlled overnight  - Continue home amlodipine, metoprolol - Hold losartan with renal stress due to Lasix  #Hyperlipidemia - On pravastatin prior to admission - With risk factors, transitioned to Lipitor 40mg   #Anemia: Most likely anemia of CKD. - Iron panel pending still  - Supplement with iron as needed  FEN/GI: Heart healthy/carb modified, saline lock IV Prophylaxis: Subcutaneous heparin  Disposition: Pending improvement in diuresis/symptoms   Subjective:  Patient states she's doing okay. Tells me she hasn't been up an ambulated, the most she's done is transferred from the bed to the chair. Feels Kristi [name] has been working well.   Objective: Temp:  [97.4 F (36.3 C)-99.1 F (37.3 C)] 97.6 F (36.4 C) (11/23 0300) Pulse Rate:  [71-89] 71 (11/23 0500) Resp:  [17-30] 17 (11/23 0500) BP: (116-145)/(38-59) 125/45 mmHg (11/23 0400) SpO2:  [92 %-99 %] 97 % (11/23 0500) Weight:  [193 lb 5.5 oz (87.7 kg)] 193 lb 5.5 oz (87.7 kg) (11/23 0300)    Intake/Output Summary (Last 24 hours) at 10/31/14 0657 Last data filed at 10/31/14 0600  Gross per 24 hour  Intake    600 ml  Output  3480 ml  Net  -2880 ml    Physical Exam: General: Lying flat in bed getting a Duoneb treatment Cardiovascular: RRR, no m/r/g noted. No JVD. 1+ DP and radial pulses bilaterally Respiratory: diffuse mild exp wheezess, no crackles or rhonchi; normal WOB Abdomen: +BS, soft, ND/NT Extremities: No deformities. Edema  improving, trace BL Neuro: A&O, no gross abnormalities  Laboratory:  Recent Labs Lab 10/29/14 0338 10/30/14 0302 10/31/14 0320  WBC 4.5 6.4 7.3  HGB 9.1* 9.4* 9.9*  HCT 28.6* 29.5* 31.4*  PLT 178 171 178    Recent Labs Lab 10/29/14 0338 10/30/14 0302 10/31/14 0320  NA 146 141 139  K 3.8 4.3 4.6  CL 108 103 99  CO2 20 21 20   BUN 53* 54* 59*  CREATININE 2.97* 3.22* 3.25*  CALCIUM 9.3 9.3 9.3  GLUCOSE 63* 116* 239*   ProBNP 12,267 Point-of-care troponin 0.01  Imaging/Diagnostic Tests: 2 view chest x-ray 10/28/2014: Stable chest radiograph findings. No acute disease.  EKG with no discrete ischemic signs by with first-degree heart block, slight right axis deviation, and T-wave inversion in V4 through V6  TTE: EF 60-65% . Grade 3 diastolic dysfunction. PA peak pressure 108mmHg  Kristi Patten, MD 10/31/2014, 6:57 AM PGY-1, [place] Intern pager: [phone], text pages welcome

## 2014-10-31 NOTE — Progress Notes (Signed)
Pt switched to PO lasix. Discussed with MD removing foley d/t presence of blood clots in urine. Verbal order to remove foley. Will continue to monitor.

## 2014-11-01 LAB — BASIC METABOLIC PANEL
Anion gap: 19 — ABNORMAL HIGH (ref 5–15)
BUN: 77 mg/dL — ABNORMAL HIGH (ref 6–23)
CALCIUM: 9.1 mg/dL (ref 8.4–10.5)
CO2: 22 mEq/L (ref 19–32)
CREATININE: 3.47 mg/dL — AB (ref 0.50–1.10)
Chloride: 92 mEq/L — ABNORMAL LOW (ref 96–112)
GFR calc Af Amer: 14 mL/min — ABNORMAL LOW (ref >90)
GFR calc non Af Amer: 12 mL/min — ABNORMAL LOW (ref >90)
GLUCOSE: 277 mg/dL — AB (ref 70–99)
Potassium: 4.5 mEq/L (ref 3.7–5.3)
SODIUM: 133 meq/L — AB (ref 137–147)

## 2014-11-01 LAB — CBC
HCT: 28.9 % — ABNORMAL LOW (ref 36.0–46.0)
Hemoglobin: 9.3 g/dL — ABNORMAL LOW (ref 12.0–15.0)
MCH: 29.6 pg (ref 26.0–34.0)
MCHC: 32.2 g/dL (ref 30.0–36.0)
MCV: 92 fL (ref 78.0–100.0)
PLATELETS: 173 10*3/uL (ref 150–400)
RBC: 3.14 MIL/uL — ABNORMAL LOW (ref 3.87–5.11)
RDW: 16.8 % — ABNORMAL HIGH (ref 11.5–15.5)
WBC: 9 10*3/uL (ref 4.0–10.5)

## 2014-11-01 LAB — GLUCOSE, CAPILLARY
GLUCOSE-CAPILLARY: 404 mg/dL — AB (ref 70–99)
GLUCOSE-CAPILLARY: 315 mg/dL — AB (ref 70–99)
Glucose-Capillary: 244 mg/dL — ABNORMAL HIGH (ref 70–99)
Glucose-Capillary: 274 mg/dL — ABNORMAL HIGH (ref 70–99)

## 2014-11-01 MED ORDER — INSULIN ASPART 100 UNIT/ML ~~LOC~~ SOLN
0.0000 [IU] | Freq: Three times a day (TID) | SUBCUTANEOUS | Status: DC
  Administered 2014-11-01: 8 [IU] via SUBCUTANEOUS
  Administered 2014-11-01: 15 [IU] via SUBCUTANEOUS
  Administered 2014-11-02: 8 [IU] via SUBCUTANEOUS

## 2014-11-01 MED ORDER — INSULIN ASPART 100 UNIT/ML ~~LOC~~ SOLN: 0.0000 [IU] | Freq: Every day | SUBCUTANEOUS | Status: DC

## 2014-11-01 MED ORDER — IPRATROPIUM-ALBUTEROL 0.5-2.5 (3) MG/3ML IN SOLN: 3.0000 mL | RESPIRATORY_TRACT | Status: DC

## 2014-11-01 MED ORDER — ASPIRIN EC 81 MG PO TBEC
81.0000 mg | DELAYED_RELEASE_TABLET | Freq: Every day | ORAL | Status: DC
  Administered 2014-11-01 – 2014-11-02 (×2): 81 mg via ORAL
  Filled 2014-11-01 (×4): qty 1

## 2014-11-01 MED ORDER — TORSEMIDE 20 MG PO TABS
40.0000 mg | ORAL_TABLET | Freq: Two times a day (BID) | ORAL | Status: DC
  Filled 2014-11-01 (×2): qty 2

## 2014-11-01 MED ORDER — TORSEMIDE 20 MG PO TABS
40.0000 mg | ORAL_TABLET | Freq: Two times a day (BID) | ORAL | Status: DC
  Filled 2014-11-01: qty 2

## 2014-11-01 MED ORDER — FUROSEMIDE 80 MG PO TABS
80.0000 mg | ORAL_TABLET | Freq: Every day | ORAL | Status: DC
  Administered 2014-11-02 – 2014-11-03 (×2): 80 mg via ORAL
  Filled 2014-11-01 (×2): qty 1

## 2014-11-01 NOTE — Progress Notes (Signed)
1706 CBG 404 no sign of hyperglycemia .Placed a call to [facility] practice team .Spoken with Dr . Lorenso Courier .To give the amount of insulin in her sliding scale

## 2014-11-01 NOTE — Progress Notes (Signed)
Report received from [place],RN 2 H

## 2014-11-01 NOTE — Progress Notes (Signed)
[facility] ROUNDING NOTE   Subjective:   Interval History: no complaints  Objective:  Vital signs in last 24 hours:  Temp:  [97.5 F (36.4 C)-99.6 F (37.6 C)] 98.5 F (36.9 C) (11/24 0757) Pulse Rate:  [60-77] 71 (11/24 0800) Resp:  [15-33] 18 (11/24 0800) BP: (104-115)/(36-49) 109/49 mmHg (11/24 0800) SpO2:  [91 %-100 %] 91 % (11/24 0800) Weight:  [88.179 kg (194 lb 6.4 oz)] 88.179 kg (194 lb 6.4 oz) (11/24 0500)  Weight change: 0.479 kg (1 lb 0.9 oz) Filed Weights   10/30/14 0406 10/31/14 0300 11/01/14 0500  Weight: 91.9 kg (202 lb 9.6 oz) 87.7 kg (193 lb 5.5 oz) 88.179 kg (194 lb 6.4 oz)    Intake/Output: I/O last 3 completed shifts: In: 860 [P.O.:860] Out: 1535 [Urine:1535]   Intake/Output this shift:     CVS- RRR RS- CTA ABD- BS present soft non-distended EXT- no edema   Basic Metabolic Panel:  Recent Labs Lab 10/28/14 1255 10/28/14 1554 10/29/14 0338 10/30/14 0302 10/31/14 0320 11/01/14 0301  NA 144  --  146 141 139 133*  K 4.2  --  3.8 4.3 4.6 4.5  CL 106  --  108 103 99 92*  CO2 18*  --  20 21 20 22   GLUCOSE 107*  --  63* 116* 239* 277*  BUN 50*  --  53* 54* 59* 77*  CREATININE 2.79*  --  2.97* 3.22* 3.25* 3.47*  CALCIUM 9.9  --  9.3 9.3 9.3 9.1  MG  --  2.6* 2.5  --   --   --   PHOS  --   --  5.0*  --   --   --     Liver Function Tests:  Recent Labs Lab 10/29/14 0338  ALBUMIN 2.9*   No results for input(s): LIPASE, AMYLASE in the last 168 hours. No results for input(s): AMMONIA in the last 168 hours.  CBC:  Recent Labs Lab 10/28/14 1255 10/29/14 0338 10/30/14 0302 10/31/14 0320 11/01/14 0301  WBC 7.3 4.5 6.4 7.3 9.0  HGB 10.6* 9.1* 9.4* 9.9* 9.3*  HCT 33.4* 28.6* 29.5* 31.4* 28.9*  MCV 92.8 92.3 94.6 93.7 92.0  PLT 207 178 171 178 173    Cardiac Enzymes:  Recent Labs Lab 10/28/14 1554 10/28/14 2140 10/29/14 0338  TROPONINI <0.30 <0.30 <0.30    BNP: Invalid input(s): POCBNP  CBG:  Recent  Labs Lab 10/31/14 0752 10/31/14 1226 10/31/14 1636 10/31/14 2208 11/01/14 0758  GLUCAP 244* 265* 293* 290* 244*    Microbiology: Results for orders placed or performed during the hospital encounter of 10/28/14  MRSA PCR Screening     Status: None   Collection Time: 10/28/14  4:54 PM  Result Value Ref Range Status   MRSA by PCR NEGATIVE NEGATIVE Final    Comment:        The GeneXpert MRSA Assay (FDA approved for NASAL specimens only), is one component of a comprehensive MRSA colonization surveillance program. It is not intended to diagnose MRSA infection nor to guide or monitor treatment for MRSA infections.     Coagulation Studies: No results for input(s): LABPROT, INR in the last 72 hours.  Urinalysis:  Recent Labs  10/30/14 2323  COLORURINE YELLOW  LABSPEC 1.015  PHURINE 5.0  GLUCOSEU NEGATIVE  HGBUR LARGE*  BILIRUBINUR NEGATIVE  KETONESUR NEGATIVE  PROTEINUR 100*  UROBILINOGEN 0.2  NITRITE NEGATIVE  LEUKOCYTESUR SMALL*      Imaging: Dg Chest 2 View  10/30/2014  CLINICAL DATA:  Coughing up mucus, shortness of breath which began 2 weeks ago, history COPD, CHF, GERD, hypertension, diabetes, 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  Atherosclerotic calcification aorta.  No definite acute infiltrate or pneumothorax.  Tiny effusions blunt the posterior costophrenic angles.  Bones demineralized.  Numerous EKG leads project over chest.  IMPRESSION: Enlargement of cardiac silhouette with pulmonary vascular congestion.  Tiny bibasilar pleural effusions.   Electronically Signed   By: Lavonia Dana M.D.   On: 10/30/2014 13:33     Medications:     . allopurinol  200 mg Oral Daily  . amLODipine  5 mg Oral Daily  . antiseptic oral rinse  7 mL Mouth Rinse BID  . aspirin  325 mg Oral QHS  . atorvastatin  40 mg Oral q1800  . darbepoetin  (ARANESP) injection - NON-DIALYSIS  100 mcg Subcutaneous Q Sat-1800  . doxycycline  100 mg Oral Q12H  . furosemide  80 mg Oral BID  . heparin  5,000 Units Subcutaneous 3 times per day  . insulin aspart  0-15 Units Subcutaneous TID WC  . insulin aspart  0-5 Units Subcutaneous QHS  . insulin glargine  13 Units Subcutaneous QHS  . ipratropium-albuterol  3 mL Nebulization TID  . metoprolol succinate  12.5 mg Oral Daily  . mometasone-formoterol  2 puff Inhalation BID  . omega-3 acid ethyl esters  1 g Oral Daily  . pantoprazole  40 mg Oral Daily  . predniSONE  50 mg Oral Q breakfast  . sodium chloride  3 mL Intravenous Q12H   acetaminophen, albuterol, benzonatate, guaiFENesin, oxyCODONE-acetaminophen  Assessment/ Plan:  1. Volume overload in patient with chronic kidney disease stage IV: Will continue to 80mg  BID  2. CHF exacerbation: She has diastolic heart failure with a preserved ejection fraction. With COPD and evidence of pulmonary hypertension per cardiology-ongoing cardiology evaluation/management as well. 3. Anemia: Anemia of chronic kidney disease, iron studies ordered , current hemoglobin 9 's , resumed ESA. 4. History of chronic obstructive lung disease: She has evidence of bronchospasm today on physical exam-wheezing/rhonchi bilaterally and surprisingly/unfortunately not started on her bronchodilator therapy upon admission-I recommend starting this promptly.   Will sign off and follow up Dr Lorrene Reid  Please call if any concerns or issues  [place]     LOS: 4 Danile Trier W @TODAY @10 :34 AM

## 2014-11-01 NOTE — Progress Notes (Signed)
Patient has not voided since foley removed. Patient was in and out cath around 0600 11/24. Bladder scanned patient at 1130, scan showed 96 ML in bladder. MD made aware. Will not in and out cath at this time. Roxan Hockey, RN

## 2014-11-01 NOTE — Progress Notes (Signed)
Family Medicine Teaching Service Daily Progress Note Intern Pager: [phone]  Patient name: Kristi [name] Medical record number: [id number] Date of birth: 05-09-1941 Age: 73 y.o. Gender: female  Primary Care Provider: Elberta Leatherwood, MD Consultants: Nephrology, cardiology  Code Status: Full   Pt Overview and Major Events to Date:  11/20: Admitted with DOE x 2 weeks found to be fluid overloaded. Placed on BiPAP but able to be weaned to 2L [place] 11/22: Added Prednisone and doxy for ?COPD exacerbation. CXR obtained; decreased to Lasix 40mg  due to elevated creatinine 11/23: Transition to PO Lasix   Assessment and Plan: Kristi [name] is a 73 y.o. female presenting with progressive dyspnea x 2 weeks found to be volume overloaded. PMH is significant for CKD stage IV, diastolic CHF, HTN, T2 DM, COPD, DVT, CVA hyperlipidemia.  #DyspneaVolume overload, CHF exacerbation: likely multifactorial with volume overload seemingly the primary etiology, also concern for COPD exacerbation. - MI ruled out - Repeat Echo with normal EF, Grade 3 diastolic dysf and severely increased PA pressure Stable on 2L [place] (no O2 at home); no respiratory symptoms currently - Lasix 80mg  PO BID; poor response since transitioning to PO (580cc/day) - Spoke with Dr. Percival Spanish with cardiology who recommended decreasing Lasix 80mg  daily, additionally, no need for pharmacologic intervention for her pulmonary hypertension, she will most likely require home O2 (note to come later).  - Nephrology consulted, appreciate recommendations - Cardiology consulted, appreciate recommendations  - Strict I&Os and daily weights: foley removed 11/23 due to small blood clots in U/A - Cre up more to 3.47 from 2.7 four days ago - Daily renal function panel - PT to evaluate today.  #CKD, stage IV: Thought to secondary to diabetes/HTN. Follows with Dr. Lorrene Reid. Baseline creatinine appears to be between 2.7-3.0 - Creatinine up to 3.74 today, most likely 2/2  diuresis  - Nephrology consulted, appreciate recommendations  - Continue to monitor renal function   #COPD, COPD exacerbation: Repeat CXR revealed enlargement of cardiac silhouette with pulmonary vascular congestion and tiny bibasilar pleural effusions. - now with wheezing and coughing, adding prednisone and doxycycline  - DuoNebs q6hrs -  Held home spiriva as she's getting duonebs (ipratropium in place of tiotropium); will order as patient states she does not have much left at home.   #Urinary retention: Patient unable to void overnight. Bladder scan revealed 300cc; she was in and out cathed which resulted in 150cc out. - Bladder scan PRN - Medications reviewed, do not see anything that could cause urinary retention but will speak with pharmacy.   #T2 DM - Well-controlled A1c 6.7 - Hold home glipizide - Home Lantus 13u daily  - Expect CBGs to be more elevated in the setting of steroids - Increasing from sensitive SSI to moderate SSI - CBGs 239-290   #HTN - Well-controlled overnight  - Continue home amlodipine, metoprolol - Hold losartan with renal stress due to Lasix   #Hyperlipidemia - On pravastatin prior to admission - Ideally she would be on Lipitor at discharge, however patient currently unable to afford.  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 - Iron panel: ferritin normal, iron and % saturation low  - Supplement with iron as needed  FEN/GI: Heart healthy/carb modified, saline lock IV Prophylaxis: Subcutaneous heparin  Disposition: Pending improvement in diuresis/symptoms; order to transfer out of SDU placed  Subjective:  Patient doing well overnight. Feels when she ambulated, she was SOB but felt she wasn't as bad as when she was admitted (she wasn't panting/gasping for  air). Feels she is getting weak.   Objective: Temp:  [97.5 F (36.4 C)-99.6 F (37.6 C)] 98.5 F (36.9 C) (11/23 2350) Pulse Rate:  [60-77] 72 (11/24 0700) Resp:  [15-33] 18 (11/24 0700) BP:  (110-118)/(36-48) 115/48 mmHg (11/24 0600) SpO2:  [94 %-100 %] 95 % (11/24 0700) Weight:  [194 lb 6.4 oz (88.179 kg)] 194 lb 6.4 oz (88.179 kg) (11/24 0500)    Intake/Output Summary (Last 24 hours) at 11/01/14 0729 Last data filed at 11/01/14 0630  Gross per 24 hour  Intake    620 ml  Output    580 ml  Net     40 ml    Physical Exam: General: Lying flat in bed in NAD Cardiovascular: RRR, no m/r/g noted. No JVD. 1+ DP and radial pulses bilaterally Respiratory: Minimal diffuse exp wheezess, no crackles or rhonchi; normal WOB Abdomen: +BS, soft, ND/NT Extremities: No deformities. Edema improving, trace BL Neuro: A&O, no gross abnormalities  Laboratory:  Recent Labs Lab 10/30/14 0302 10/31/14 0320 11/01/14 0301  WBC 6.4 7.3 9.0  HGB 9.4* 9.9* 9.3*  HCT 29.5* 31.4* 28.9*  PLT 171 178 173    Recent Labs Lab 10/30/14 0302 10/31/14 0320 11/01/14 0301  NA 141 139 133*  K 4.3 4.6 4.5  CL 103 99 92*  CO2 21 20 22   BUN 54* 59* 77*  CREATININE 3.22* 3.25* 3.47*  CALCIUM 9.3 9.3 9.1  GLUCOSE 116* 239* 277*  Anion gap: 19   ProBNP 12,267 Point-of-care troponin 0.01  Imaging/Diagnostic Tests: 2 view chest x-ray 10/28/2014: Stable chest radiograph findings. No acute disease.  EKG with no discrete ischemic signs by with first-degree heart block, slight right axis deviation, and T-wave inversion in V4 through V6  TTE: EF 60-65% . Grade 3 diastolic dysfunction. PA peak pressure 54mmHg  Archie Patten, MD 11/01/2014, 7:29 AM PGY-1, [place] Intern pager: [phone], text pages welcome

## 2014-11-01 NOTE — Progress Notes (Signed)
Inpatient Diabetes Program Recommendations  AACE/ADA: New Consensus Statement on Inpatient Glycemic Control (2013)  Target Ranges:  Prepandial:   less than 140 mg/dL      Peak postprandial:   less than 180 mg/dL (1-2 hours)      Critically ill patients:  140 - 180 mg/dL  Results for RUTHE, ROEMER (MRN [id number]) as of 11/01/2014 09:36  Ref. Range 10/31/2014 07:52 10/31/2014 12:26 10/31/2014 16:36 10/31/2014 22:08 11/01/2014 07:58  Glucose-Capillary Latest Range: 70-99 mg/dL 244 (H) 265 (H) 293 (H) 290 (H) 244 (H)   Inpatient Diabetes Program Recommendations Correction (SSI): consider increasing to Resistant scale during steroid therapy Thank you  Raoul Pitch BSN, RN,CDE Inpatient Diabetes Coordinator [phone] (team pager)

## 2014-11-01 NOTE — Progress Notes (Signed)
Patient expressed need to urinate.  accidentally voided in the bed before making it to the bedside commode. Unable to measure this output. Estimate a possible 75-100cc. Roxan Hockey, RN

## 2014-11-01 NOTE — Evaluation (Signed)
Physical Therapy Evaluation Patient Details Name: KALYNNE WOMAC MRN: [id number] DOB: 12/23/1940 Today's Date: 11/01/2014   History of Present Illness  Pt adm with CHF and COPD exacerbation. PMH is significant for CKD stage IV, diastolic CHF, HTN, T2 DM, COPD, DVT, CVA  Clinical Impression  Pt admitted with above. Pt currently with functional limitations due to the deficits listed below (see PT Problem List).  Pt will benefit from skilled PT to increase their independence and safety with mobility to allow discharge to home with intermittent support of family and friends.    Follow Up Recommendations Home health PT;Supervision - Intermittent    Equipment Recommendations  None recommended by PT    Recommendations for Other Services       Precautions / Restrictions Precautions Precautions: Fall      Mobility  Bed Mobility Overal bed mobility: Modified Independent             General bed mobility comments: Pt using momentum to bring trunk up.  Transfers Overall transfer level: Needs assistance Equipment used: Rolling walker (2 wheeled) Transfers: Sit to/from Stand Sit to Stand: Min guard            Ambulation/Gait Ambulation/Gait assistance: Min guard Ambulation Distance (Feet): 170 Feet Assistive device: Rolling walker (2 wheeled) Gait Pattern/deviations: Step-through pattern;Decreased stride length   Gait velocity interpretation: Below normal speed for age/gender General Gait Details: Initially steady using walker but as distance incr became slightly unsteady. Dyspnea 3/4 by end of ambulation.   Stairs            Wheelchair Mobility    Modified Rankin (Stroke Patients Only)       Balance Overall balance assessment: Needs assistance Sitting-balance support: No upper extremity supported;Feet supported Sitting balance-Leahy Scale: Good     Standing balance support: No upper extremity supported Standing balance-Leahy Scale: Fair                                Pertinent Vitals/Pain Pain Assessment: No/denies pain    Home Living Family/patient expects to be discharged to:: Private residence Living Arrangements: Alone Available Help at Discharge: Family;Friend(s);Available PRN/intermittently Type of Home: House Home Access: Stairs to enter Entrance Stairs-Rails: Right Entrance Stairs-Number of Steps: 3 Home Layout: One level Home Equipment: Walker - 2 wheels;Walker - 4 wheels;Cane - single point      Prior Function Level of Independence: Independent with assistive device(s)         Comments: Uses cane or walker at home.     Hand Dominance        Extremity/Trunk Assessment   Upper Extremity Assessment: Overall WFL for tasks assessed           Lower Extremity Assessment: Generalized weakness         Communication   Communication: No difficulties  Cognition Arousal/Alertness: Awake/alert Behavior During Therapy: WFL for tasks assessed/performed Overall Cognitive Status: Within Functional Limits for tasks assessed                      General Comments      Exercises        Assessment/Plan    PT Assessment Patient needs continued PT services  PT Diagnosis Difficulty walking;Generalized weakness   PT Problem List Decreased strength;Decreased activity tolerance;Decreased balance;Decreased mobility  PT Treatment Interventions DME instruction;Gait training;Patient/family education;Stair training;Functional mobility training;Therapeutic activities;Therapeutic exercise;Balance training   PT Goals (Current goals can be  found in the Care Plan section) Acute Rehab PT Goals Patient Stated Goal: Decr shortness of breath to be able to do more PT Goal Formulation: With patient Time For Goal Achievement: 11/08/14 Potential to Achieve Goals: Good    Frequency Min 3X/week   Barriers to discharge        Co-evaluation               End of Session Equipment Utilized During  Treatment: Oxygen Activity Tolerance: Patient limited by fatigue Patient left: in bed;with call bell/phone within reach           Time: 1032-1053 PT Time Calculation (min) (ACUTE ONLY): 21 min   Charges:   PT Evaluation $Initial PT Evaluation Tier I: 1 Procedure PT Treatments $Gait Training: 8-22 mins   PT G Codes:          Chiante Peden 11/27/14, 1:29 PM  [facility] PT [phone]

## 2014-11-01 NOTE — Progress Notes (Signed)
SUBJECTIVE:  SOB improved. Reports still had some DOE when ambulating with nursing staff (while still on oxygen).  OBJECTIVE:   Vitals:   Filed Vitals:   11/01/14 0757 11/01/14 0800 11/01/14 1049 11/01/14 1137  BP: 104/49 109/49  116/47  Pulse: 71 71  74  Temp: 98.5 F (36.9 C)     TempSrc: Oral     Resp: 18 18  18   Height:      Weight:      SpO2: 93% 91% 90% 89%   I&O's:    Intake/Output Summary (Last 24 hours) at 11/01/14 1328 Last data filed at 11/01/14 0800  Gross per 24 hour  Intake    560 ml  Output    365 ml  Net    195 ml   TELEMETRY: Reviewed telemetry pt in NSR:   PHYSICAL EXAM General: Well developed, well nourished, in no acute distress  Head:   Normal cephalic and atramatic  Lungs:  Mild bibasilar rales Heart:  HRRR S1 S2  No appreciated JVD.   Abdomen: abdomen soft and non-tender Msk:  Back normal,  Normal strength and tone for age. Extremities:  No edema.   Neuro: Alert and oriented. Psych:  Normal affect, responds appropriately Skin: No rash    LABS: Basic Metabolic Panel:  Recent Labs  10/31/14 0320 11/01/14 0301  NA 139 133*  K 4.6 4.5  CL 99 92*  CO2 20 22  GLUCOSE 239* 277*  BUN 59* 77*  CREATININE 3.25* 3.47*  CALCIUM 9.3 9.1   Liver Function Tests: No results for input(s): AST, ALT, ALKPHOS, BILITOT, PROT, ALBUMIN in the last 72 hours. No results for input(s): LIPASE, AMYLASE in the last 72 hours. CBC:  Recent Labs  10/31/14 0320 11/01/14 0301  WBC 7.3 9.0  HGB 9.9* 9.3*  HCT 31.4* 28.9*  MCV 93.7 92.0  PLT 178 173   Cardiac Enzymes: No results for input(s): CKTOTAL, CKMB, CKMBINDEX, TROPONINI in the last 72 hours. BNP: Invalid input(s): POCBNP D-Dimer: No results for input(s): DDIMER in the last 72 hours. Hemoglobin A1C: No results for input(s): HGBA1C in the last 72 hours. Fasting Lipid Panel: No results for input(s): CHOL, HDL, LDLCALC, TRIG, CHOLHDL, LDLDIRECT in the last 72 hours. Thyroid Function  Tests: No results for input(s): TSH, T4TOTAL, T3FREE, THYROIDAB in the last 72 hours.  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  Atherosclerotic calcification aorta.  No definite acute infiltrate or pneumothorax.  Tiny effusions blunt the posterior costophrenic angles.  Bones demineralized.  Numerous EKG leads project over chest.  IMPRESSION: Enlargement of cardiac silhouette with pulmonary vascular congestion.  Tiny bibasilar pleural effusions.   Electronically Signed   By: Lavonia Dana M.D.   On: 10/30/2014 13:33   Dg Chest 2 View  10/28/2014   CLINICAL DATA:  Shortness of breath and COPD. History of smoking. Chest pain.  EXAM: CHEST  2 VIEW  COMPARISON:  10/30/2012  FINDINGS: Coarse lung markings are similar to the previous examination. No focal airspace disease. Heart size is upper limits of normal but unchanged. Atherosclerotic calcifications at the aortic arch. No acute bone abnormality.  IMPRESSION: Stable chest radiograph findings.  No acute disease.   Electronically Signed   By: Markus Daft M.D.   On: 10/28/2014 13:29    ASSESSMENT/PLAN:     Acute on chronic diastolic HF (heart failure), Pulmonary HTN - Net neg 6.9 L, weight down 8 lbs.  BUN and SCr continue to trend up. - Will decrease lasix back to home dose of 80mg  daily - Need to ambulate and document O2 needs, patient may need home O2. - No need to start additional agents for pulmonary HTN. - PT eval    COLD (chronic  obstructive lung disease) - Improved - Per primary - Ambulate patient and document oxygen needs    Essential hypertension, benign - Well controlled    Type 2 diabetes mellitus with complication -hyperglycemic on prednisone - Management per primary    Chronic kidney disease (CKD), stage IV (severe) - Baseline SCr ~2.8, with diuresis trended up to 3.4.  Nephrology signed off.    Lucious Groves, DO  11/01/2014  1:28 PM   History and all data above reviewed.  Patient examined.  I agree with the findings as above.  The patient exam reveals COR:RRR  ,  Lungs: Clear  ,  Abd: Positive bowel sounds, no rebound no guarding, Ext No edema  .  All available labs, radiology testing, previous records reviewed. Agree with documented assessment and plan. Right greater than left sided acute diastolic HF:  I would reduce her diuresis.  I do not think that overt left sided failure is the main issue.  She has a rising creat with baseline CKD and her elevated pulmonary pressure.  I would suggest that she might need home O2 at least for some period of time after discharge.  At this point I don't see a role for medications for pulmonary HTN.  However, if over time she remains O2 dependent with close volume management (plus or minus dialysis in the future) we could consider this.    Jeneen Rinks Ladainian Therien  1:55 PM  11/01/2014

## 2014-11-02 ENCOUNTER — Telehealth: Payer: Self-pay | Admitting: *Deleted

## 2014-11-02 LAB — CBC
HCT: 29.1 % — ABNORMAL LOW (ref 36.0–46.0)
Hemoglobin: 9.5 g/dL — ABNORMAL LOW (ref 12.0–15.0)
MCH: 29.7 pg (ref 26.0–34.0)
MCHC: 32.6 g/dL (ref 30.0–36.0)
MCV: 90.9 fL (ref 78.0–100.0)
PLATELETS: 206 10*3/uL (ref 150–400)
RBC: 3.2 MIL/uL — AB (ref 3.87–5.11)
RDW: 16.6 % — AB (ref 11.5–15.5)
WBC: 9 10*3/uL (ref 4.0–10.5)

## 2014-11-02 LAB — GLUCOSE, CAPILLARY
GLUCOSE-CAPILLARY: 129 mg/dL — AB (ref 70–99)
Glucose-Capillary: 298 mg/dL — ABNORMAL HIGH (ref 70–99)
Glucose-Capillary: 203 mg/dL — ABNORMAL HIGH (ref 70–99)
Glucose-Capillary: 246 mg/dL — ABNORMAL HIGH (ref 70–99)

## 2014-11-02 LAB — BASIC METABOLIC PANEL
ANION GAP: 19 — AB (ref 5–15)
BUN: 94 mg/dL — ABNORMAL HIGH (ref 6–23)
CO2: 21 mEq/L (ref 19–32)
Calcium: 8.8 mg/dL (ref 8.4–10.5)
Chloride: 95 mEq/L — ABNORMAL LOW (ref 96–112)
Creatinine, Ser: 3.63 mg/dL — ABNORMAL HIGH (ref 0.50–1.10)
GFR, EST AFRICAN AMERICAN: 13 mL/min — AB (ref >90)
GFR, EST NON AFRICAN AMERICAN: 11 mL/min — AB (ref >90)
Glucose, Bld: 286 mg/dL — ABNORMAL HIGH (ref 70–99)
POTASSIUM: 4.2 meq/L (ref 3.7–5.3)
Sodium: 135 mEq/L — ABNORMAL LOW (ref 137–147)

## 2014-11-02 MED ORDER — MOMETASONE FURO-FORMOTEROL FUM 100-5 MCG/ACT IN AERO: 2.0000 | INHALATION_SPRAY | Freq: Two times a day (BID) | RESPIRATORY_TRACT | Status: DC

## 2014-11-02 MED ORDER — DOXYCYCLINE HYCLATE 100 MG PO TABS: 100.0000 mg | ORAL_TABLET | Freq: Two times a day (BID) | ORAL | Status: DC

## 2014-11-02 MED ORDER — BENZONATATE 100 MG PO CAPS: 100.0000 mg | ORAL_CAPSULE | Freq: Three times a day (TID) | ORAL | Status: DC | PRN

## 2014-11-02 MED ORDER — INSULIN GLARGINE 100 UNIT/ML ~~LOC~~ SOLN
5.0000 [IU] | Freq: Once | SUBCUTANEOUS | Status: AC
  Administered 2014-11-02: 5 [IU] via SUBCUTANEOUS
  Filled 2014-11-02 (×2): qty 0.05

## 2014-11-02 MED ORDER — PREDNISONE 50 MG PO TABS: 50.0000 mg | ORAL_TABLET | Freq: Every day | ORAL | Status: DC

## 2014-11-02 MED ORDER — FUROSEMIDE 80 MG PO TABS: 80.0000 mg | ORAL_TABLET | Freq: Every day | ORAL | Status: DC

## 2014-11-02 MED ORDER — INSULIN ASPART 100 UNIT/ML ~~LOC~~ SOLN
0.0000 [IU] | Freq: Three times a day (TID) | SUBCUTANEOUS | Status: DC
  Administered 2014-11-02 (×2): 7 [IU] via SUBCUTANEOUS
  Administered 2014-11-03: 3 [IU] via SUBCUTANEOUS

## 2014-11-02 NOTE — Progress Notes (Addendum)
SATURATION QUALIFICATIONS: (This note is used to comply with regulatory documentation for home oxygen)  Patient Saturations on Room Air at Rest = 94%  Patient Saturations on Room Air while Ambulating = 87%  Patient Saturations o4 Liters of oxygen while Ambulating 94%  Please briefly explain why patient needs home oxygen:desats during ambulation recovers on 4l/m ncwith sob on ambulation

## 2014-11-02 NOTE — Progress Notes (Signed)
Patient rested quietly through the night. Tessalon pearls given for non-productive cough.

## 2014-11-02 NOTE — Telephone Encounter (Signed)
Patient contacted regarding discharge from [facility] today.  Patient understands to follow up with provider  Kristi [name] on 11/09/2014 at 2:00 pm. Patient understands discharge instructions?they will review with her. Patient understands medications and regiment?yes Patient understands to bring all medications to this visit?yes

## 2014-11-02 NOTE — Discharge Summary (Signed)
[facility] Discharge Summary  Patient name: Kristi [name] record number: [id number] Date of birth: 04-Dec-1942Age: 73 y.o.Gender: female Date of Admission: 11/20/2015Date of Discharge: 11/02/14 Admitting Physician: Andrena Mews, MD  Primary Care Provider: Elberta Leatherwood, MD Consultants: Cardiology, nephrology  Indication for Hospitalization: Dyspnea on exertion  Discharge Diagnoses/Problem List:  CHF exacerbation  Acute on chronic diastolic heart failure Pulmonary hypertension  CKD stage IV COPD HLD  Type 2 diabetes mellitus H/o DVT H/o CVA  Disposition: Home with HH-PT and oxygen  Discharge Condition: Stable, improved   BP 121/54 mmHg  Pulse 70  Temp(Src) 98.6 F (37 C) (Oral)  Resp 18  Ht 5' (1.524 m)  Wt 194 lb 14.4 oz (88.406 kg)  BMI 38.06 kg/m2  SpO2 95% General: Lying in bed in NAD Cardiovascular: RRR, no m/r/g noted. No JVD. 1+ DP and radial pulses bilaterally Respiratory: CTAB without wheezess, crackles or rhonchi; normal WOB on RA Abdomen: +BS, soft, ND/NT Extremities: No deformities. Trace pitting edema bilaterally. Neuro: A&O, no gross abnormalities  Brief Hospital Course:  Kristi [name] is a 73 y/o with presenting with DOE that progressively worsened over 2 weeks. Initially in the ED she was hypoxic to 88% on RA and was noted to be tachypneic with increased WOB. She was placed on BiPAP x 2hrs but was eventually able to be transitioned to 2L [place].   #Dyspnea: On admission, nephrology and cardiology were consulted. CXR without acute changes, BNP elevated to 12,267, troponin negative x 3, EKG without ischemia/infarct. Nephrology initially concerned that she may require dialysis, however patient diuresed well with Lasix 80mg  IV BID which was eventually decreased to her home regimen of Lasix 80mg  PO daily. Repeat TTE revealed grade 3 diastolic dysfunction and increased  PA pressure to 96. No pharmacologic intervention of her pulmonary HTN but order was placed for home O2 as she destatted to 87% on RA with ambulation. On the day of discharge, DOE had improved significantly and patient appeared euvolemic on exam.   #CKD: Creatinine at baseline of 2.63 on admission, however with aggressive diuresis Cr increased to 3.63 on the day of discharge. Nephrology aware of continue increase despite decrease in diuresis on the day of discharge, will need repeat Cr on 1 week hospital f/u.  #COPD: Patient's home Spiriva initially held and placed on scheduled DuoNebs. Given concerns for worsening cough/wheezing, treated for a COPD exacerbation. On day 4/5 of steroids and 4/7 of doxycycline. Will be discharged home on [facility] as well.   #T2DM: Initially well controlled on home regimen of lantus, however expectedly had increase in CBGs with steroids. Given additional 5u Lantus on the day of discharge.   # HTN: Well controlled. Held home losartan given renal function.   #HLD: Initially switched to Lipitor, however patient currently in Medicare donut hole and unable to afford, therefore will continue home pravastatin.    Issues for Follow Up:  -- Repeat BMET at hospital f/u to assess creatinine function -- Consider re-starting losartan once renal function close to baseline -- Consider transition back to Lipitor once patient's is out of her Medicare gap -- Given patient's financial issues, consider samples from [facility] from clinic  Significant Procedures: None  Significant Labs and Imaging:   Last Labs      Recent Labs Lab 10/31/14 0320 11/01/14 0301 11/02/14 0431  WBC 7.3 9.0 9.0  HGB 9.9* 9.3* 9.5*  HCT 31.4* 28.9* 29.1*  PLT 178 173 206      Last Labs  Recent Labs Lab 10/28/14 1554 10/29/14 0338 10/30/14 0302 10/31/14 0320 11/01/14 0301 11/02/14 0431  NA --  146 141 139 133* 135*  K --  3.8 4.3 4.6 4.5 4.2   CL --  108 103 99 92* 95*  CO2 --  20 21 20 22 21   GLUCOSE --  63* 116* 239* 277* 286*  BUN --  53* 54* 59* 77* 94*  CREATININE --  2.97* 3.22* 3.25* 3.47* 3.63*  CALCIUM --  9.3 9.3 9.3 9.1 8.8  MG 2.6* 2.5 --  --  --  --   PHOS --  5.0* --  --  --  --   ALBUMIN --  2.9* --  --  --  --     ProBNP 12,267 Troponin negative x 3  2 view chest x-ray 10/28/2014: Stable chest radiograph findings. No acute disease.  EKG with no discrete ischemic signs by with first-degree heart block, slight right axis deviation, and T-wave inversion in V4 through V6  TTE: EF 60-65% . Grade 3 diastolic dysfunction. PA peak pressure 51mmHg  Results/Tests Pending at Time of Discharge: None  Discharge Medications:    Medication List    STOP taking these medications       losartan 25 MG tablet  Commonly known as: COZAAR      TAKE these medications       acetaminophen 500 MG tablet  Commonly known as: TYLENOL  Take 1,000 mg by mouth every 6 (six) hours as needed.     acetaminophen-codeine 300-30 MG per tablet  Commonly known as: TYLENOL #3  1 tablet every 6 (six) hours as needed for moderate pain.     albuterol 108 (90 BASE) MCG/ACT inhaler  Commonly known as: PROVENTIL HFA  Inhale 2 puffs into the lungs every 4 (four) hours as needed. For wheezing     allopurinol 100 MG tablet  Commonly known as: ZYLOPRIM  Take 2 tablets (200 mg total) by mouth daily.     amLODipine 5 MG tablet  Commonly known as: NORVASC  Take 1 tablet (5 mg total) by mouth daily.     aspirin 325 MG tablet  Take 325 mg by mouth at bedtime.     benzonatate 100 MG capsule  Commonly known as: TESSALON  Take 1 capsule (100 mg total) by mouth 3 (three) times daily as needed for cough.     [employer] ALLERGY RELIEF 180 MG tablet  Generic drug: fexofenadine  TAKE 1 TABLET BY MOUTH  EVERY DAY     doxycycline 100 MG tablet  Commonly known as: VIBRA-TABS  Take 1 tablet (100 mg total) by mouth every 12 (twelve) hours. Starting tonight     Fish Oil 1200 MG Caps  Take 2 capsules by mouth 2 (two) times daily.     furosemide 80 MG tablet  Commonly known as: LASIX  Take 1 tablet (80 mg total) by mouth daily.     glipiZIDE 5 MG 24 hr tablet  Commonly known as: GLUCOTROL XL  Take 1 tablet (5 mg total) by mouth daily with breakfast.     glucosamine-chondroitin 500-400 MG tablet  Take 1 tablet by mouth 2 (two) times daily.     HYDROcodone-acetaminophen 5-325 MG per tablet  Commonly known as: NORCO/VICODIN  Take 1 tablet by mouth every 12 (twelve) hours as needed for moderate pain.     insulin glargine 100 UNIT/ML injection  Commonly known as: LANTUS  Inject 0.13 mLs (13 Units total) into the skin every  morning. If blood sugar is >140 add 1 unit. If blood sugar is <140 subtract 1 unit.     metoprolol succinate 25 MG 24 hr tablet  Commonly known as: TOPROL-XL  Take 0.5 tablets (12.5 mg total) by mouth daily.     mometasone-formoterol 100-5 MCG/ACT Aero  Commonly known as: DULERA  Inhale 2 puffs into the lungs 2 (two) times daily.     nitroGLYCERIN 0.4 MG SL tablet  Commonly known as: NITROSTAT  Place 0.4 mg under the tongue every 5 (five) minutes as needed. For chest pain Call 911 if pain not relieved by 3rd tab     omeprazole 20 MG capsule  Commonly known as: PRILOSEC  TAKE ONE CAPSULE BY MOUTH AT BEDTIME     oxyCODONE-acetaminophen 5-325 MG per tablet  Commonly known as: PERCOCET/ROXICET  Take 1 tablet by mouth 3 (three) times daily as needed for severe pain.     pravastatin 40 MG tablet  Commonly known as: PRAVACHOL  Take 1 tablet (40 mg total) by mouth daily.     predniSONE 50 MG tablet  Commonly known as: DELTASONE  Take 1 tablet (50 mg total) by mouth daily with  breakfast.     SYSTANE OP  Apply 1-2 drops to eye 4 (four) times daily as needed. For dry eyes     Tiotropium Bromide Monohydrate 2.5 MCG/ACT Aers  Commonly known as: SPIRIVA RESPIMAT  Inhale 2 puffs once daily     vitamin B-12 1000 MCG tablet  Commonly known as: CYANOCOBALAMIN  Take 1,000 mcg by mouth every morning.        Discharge Instructions: Please refer to Patient Instructions section of EMR for full details. Patient was counseled important signs and symptoms that should prompt return to medical care, changes in medications, dietary instructions, activity restrictions, and follow up appointments.   Follow-Up Appointments: Follow-up Information    Follow up with Truitt Merle, NP On 11/09/2014.   Specialty: Nurse Practitioner   Why: @ 2pm   Contact information:   [address] [place] [address] [phone]       Follow up with Kenn File, MD On 11/09/2014.   Specialty: Family Medicine   Why: at [place] for a hospital follow up   Contact information:   [place] [address] [phone]       Archie Patten, MD 11/02/2014, 12:14 PM PGY-1, [place]

## 2014-11-02 NOTE — Telephone Encounter (Signed)
New Message   TCM appt is on Dec 2 @ 2:00 with Truitt Merle per Joellen Jersey T

## 2014-11-02 NOTE — Progress Notes (Signed)
SUBJECTIVE:  No complaints. Just ambulated with nursing so a little SOB. She thinks she may be going home today  . allopurinol  200 mg Oral Daily  . amLODipine  5 mg Oral Daily  . antiseptic oral rinse  7 mL Mouth Rinse BID  . aspirin EC  81 mg Oral Daily  . atorvastatin  40 mg Oral q1800  . darbepoetin (ARANESP) injection - NON-DIALYSIS  100 mcg Subcutaneous Q Sat-1800  . doxycycline  100 mg Oral Q12H  . furosemide  80 mg Oral Daily  . heparin  5,000 Units Subcutaneous 3 times per day  . insulin aspart  0-20 Units Subcutaneous TID WC  . insulin aspart  0-5 Units Subcutaneous QHS  . insulin glargine  13 Units Subcutaneous QHS  . metoprolol succinate  12.5 mg Oral Daily  . mometasone-formoterol  2 puff Inhalation BID  . omega-3 acid ethyl esters  1 g Oral Daily  . pantoprazole  40 mg Oral Daily  . predniSONE  50 mg Oral Q breakfast  . sodium chloride  3 mL Intravenous Q12H    OBJECTIVE:   Vitals:   Filed Vitals:   11/01/14 2037 11/02/14 0035 11/02/14 0534 11/02/14 0840  BP:  130/57 135/53   Pulse:  76 72   Temp:  97.6 F (36.4 C) 98.1 F (36.7 C)   TempSrc:  Oral Oral   Resp:  20 20   Height:      Weight:   195 lb 12.8 oz (88.814 kg)   SpO2: 93% 94% 96% 87%   I&O's:    Intake/Output Summary (Last 24 hours) at 11/02/14 0905 Last data filed at 11/02/14 6384  Gross per 24 hour  Intake    960 ml  Output    350 ml  Net    610 ml   TELEMETRY: Reviewed telemetry pt in NSR:   PHYSICAL EXAM General: Well developed, well nourished, in no acute distress. obese Head:   Normal cephalic and atramatic  Lungs:  Mild bibasilar rales Heart:  HRRR S1 S2  No appreciated JVD.   Abdomen: abdomen soft and non-tender Msk:  Back normal,  Normal strength and tone for age. Extremities:  No edema.   Neuro: Alert and oriented. Psych:  Normal affect, responds appropriately Skin: No rash    LABS: Basic Metabolic Panel:  Recent Labs  11/01/14 0301 11/02/14 0431  NA 133* 135*   K 4.5 4.2  CL 92* 95*  CO2 22 21  GLUCOSE 277* 286*  BUN 77* 94*  CREATININE 3.47* 3.63*  CALCIUM 9.1 8.8   CBC:  Recent Labs  11/01/14 0301 11/02/14 0431  WBC 9.0 9.0  HGB 9.3* 9.5*  HCT 28.9* 29.1*  MCV 92.0 90.9  PLT 173 206    Anemia Panel:  Recent Labs  10/31/14 1305  TIBC 356  IRON 15*   Coag Panel:   Lab Results  Component Value Date   INR 1.02 10/23/2011   INR 1.22 05/09/2010   INR 1.0 06/23/2007    RADIOLOGY: Dg Chest 2 View  10/30/2014   CLINICAL DATA:  Coughing up mucus, shortness of breath which began 2 weeks ago, history COPD, CHF, GERD, hypertension, diabetes, 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  Atherosclerotic calcification aorta.  No definite acute infiltrate or pneumothorax.  Tiny effusions blunt the posterior costophrenic angles.  Bones demineralized.  Numerous  EKG leads project over chest.  IMPRESSION: Enlargement of cardiac silhouette with pulmonary vascular congestion.  Tiny bibasilar pleural effusions.   Electronically Signed   By: Lavonia Dana M.D.   On: 10/30/2014 13:33   Dg Chest 2 View  10/28/2014   CLINICAL DATA:  Shortness of breath and COPD. History of smoking. Chest pain.  EXAM: CHEST  2 VIEW  COMPARISON:  10/30/2012  FINDINGS: Coarse lung markings are similar to the previous examination. No focal airspace disease. Heart size is upper limits of normal but unchanged. Atherosclerotic calcifications at the aortic arch. No acute bone abnormality.  IMPRESSION: Stable chest radiograph findings.  No acute disease.   Electronically Signed   By: Markus Daft M.D.   On: 10/28/2014 13:29    ASSESSMENT/PLAN:     Kristi [name] is a 73 y.o. female with a history of CKD stage IV, diastolic CHF, HTN, T2 DM, COPD, DVT, CVA and hyperlipidemia who presented to [facility] on 10/28/14 with progressive dyspnea x 2 weeks found to be  volume overloaded.    Acute on chronic diastolic HF, Pulmonary HTN - Right greater than left sided acute diastolic HF. She has a rising creat with baseline CKD and her elevated pulmonary pressure. Diuresis cut back on yesterday. Currently on lasix at home dose of 80mg  daily  - Net neg 6 L, weight down 7 lbs.  BUN and SCr continue to trend up. - Dr. Percival Spanish suggested that she go home on O2 at least for some period of time after discharge.  At this point there is no role for medications for pulmonary HTN.  However, if over time she remains O2 dependent with close volume management (plus or minus dialysis in the future) we could consider this.        COLD (chronic obstructive lung disease) - Improved - Per primary - Ambulate patient and document oxygen needs. She sats at 87% on RA while ambulating which means she qualifies for home 02.     Essential hypertension, benign - Well controlled    Type 2 diabetes mellitus with complication -hyperglycemic on prednisone - Management per primary    Chronic kidney disease (CKD), stage IV (severe) - Baseline SCr ~2.8, with diuresis trended up to 3.63.  Nephrology signed off.   Dispo- will likely go home soon. She is followed by Dr. Irish [name]. I have made cardiology follow up on Dec 2 with Kristi [name].    Kristi Stanford, PA-C  11/02/2014  9:05 AM   The patient was seen, examined and discussed with Kristi Harp, PA-C and I agree with the above.   73 year old admitted with acute on chronic diastolic CHF, weight at baseline dry weight, the patient is euvolemic, worsening crea,  reduced lasix dose yesterday, we will follow crea and if stable, discharge in the am.  Kristi [name] 11/02/2014

## 2014-11-02 NOTE — Progress Notes (Signed)
Inpatient Diabetes Program Recommendations  AACE/ADA: New Consensus Statement on Inpatient Glycemic Control (2013)  Target Ranges:  Prepandial:   less than 140 mg/dL      Peak postprandial:   less than 180 mg/dL (1-2 hours)      Critically ill patients:  140 - 180 mg/dL   Results for Kristi [name], Kristi [name] (MRN [id number]) as of 11/02/2014 11:14  Ref. Range 11/01/2014 07:58 11/01/2014 12:20 11/01/2014 16:35 11/01/2014 21:31 11/02/2014 06:22 11/02/2014 11:01  Glucose-Capillary Latest Range: 70-99 mg/dL 244 (H) 274 (H) 404 (H) 315 (H) 298 (H) 203 (H)   Current orders for Inpatient glycemic control: Lantus 13 units QHS, Novolog 0-20 units AC, Novolog 0-5 units HS (also noted one time order of Lantus 5 units for today)  Inpatient Diabetes Program Recommendations Insulin - Basal: Please consider increasing Lantus to 20 units QHS. If Lantus is increased, once steroids are tapered Lantus dose will need to be titrated. Insulin - Meal Coverage: While ordered steroids, please consider ordering Novolog 5 units TID with meals for meal coverage.  Thanks, Barnie Alderman, RN, MSN, CCRN, CDE Diabetes Coordinator Inpatient Diabetes Program [phone] (Team Pager) [phone] (AP office) [phone] [facility] office)

## 2014-11-02 NOTE — Progress Notes (Signed)
Family Medicine Teaching Service Daily Progress Note Intern Pager: [phone]  Patient name: Kristi [name] Medical record number: [id number] Date of birth: 01-10-1941 Age: 73 y.o. Gender: female  Primary Care Provider: Elberta Leatherwood, MD Consultants: Nephrology, cardiology  Code Status: Full   Pt Overview and Major Events to Date:  11/20: Admitted with DOE x 2 weeks found to be fluid overloaded. Placed on BiPAP but able to be weaned to 2L [place] 11/22: Added Prednisone and doxy for ?COPD exacerbation. CXR obtained; decreased to Lasix 40mg  due to elevated creatinine 11/23: Transition to PO Lasix   Assessment and Plan: EMMELYN [name] is a 73 y.o. female presenting with progressive dyspnea x 2 weeks found to be volume overloaded. PMH is significant for CKD stage IV, diastolic CHF, HTN, T2 DM, COPD, DVT, CVA hyperlipidemia.  #DyspneaVolume overload, CHF exacerbation: likely multifactorial with volume overload seemingly the primary etiology, also concern for COPD exacerbation. - MI ruled out - Repeat Echo with normal EF, Grade 3 diastolic dysf and severely increased PA pressure. Was able to be weaned off [place] and currently satting 93-96% on RA  (no O2 at home);  - Continues to have DOE and non-productive cough  - Lasix 80mg  daily, additionally - No need for pharmacologic intervention for her pulmonary hypertension, she will most likely require home O2 (spoke to RN about obtaining required O2 sats).  - Nephrology signed off- spoke with Dr. Justin Mend this AM who was comfortable with the increase in creatinine and her f/u appt of 11/21/14.  - Cardiology consulted, appreciate recommendations  - Strict I&Os, however patient with urinary incontinence  - Cre up more to 3.6 from 2.7 five days ago - Daily renal function panel - PT rec: home health PT  #CKD, stage IV: Thought to secondary to diabetes/HTN. Follows with Dr. Lorrene Reid. Baseline creatinine appears to be between 2.7-3.0 - Creatinine up to 3.63 today  despite decrease in  - Nephrology consulted, appreciate recommendations  - Continue to monitor renal function   #COPD, COPD exacerbation: Repeat CXR revealed enlargement of cardiac silhouette with pulmonary vascular congestion and tiny bibasilar pleural effusions. - Prednisone 50mg  day 4/5 and doxycycline day 4/7.  - s/p scheduled DuoNebs q6hrs - Now on home [facility]   #Urinary retention: Resolved. - Medications reviewed, do not see anything that could cause urinary retention but will speak with pharmacy.    #T2 DM - Well-controlled A1c 6.7 - Hold home glipizide - Home Lantus 13u daily  - Expect CBGs to be more elevated in the setting of steroids - CBGs 274-404 - Will give 1 time dose of Lantus 5 units given prednisone today.  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 - Iron panel: ferritin normal, iron and % saturation low  - Supplement with iron as needed  FEN/GI: Heart healthy/carb modified, saline lock IV Prophylaxis: Subcutaneous heparin  Disposition: Discharge most likely today.  Subjective:  Patient doing better today. States she still has some DOE, however she did not think it was "as bad as the nurse told me" referring to her O2 dropping to 87% with ambulation. She has been able to urinate. Notes some dysuria after foley removed but with improvement. 1 episode of incontinence due  to waiting on nursing staff.   Objective: Temp:  [97.6 F (36.4 C)-98.5 F (36.9 C)] 98.1 F (36.7 C) (11/25 0534) Pulse Rate:  [71-77] 72 (11/25 0534) Resp:  [18-22] 20 (11/25 0534) BP: (104-135)/(47-57) 135/53 mmHg (11/25 0534) SpO2:  [89 %-96 %] 96 % (11/25 0534) Weight:  [195 lb 12.8 oz  (88.814 kg)-196 lb 1.6 oz (88.95 kg)] 195 lb 12.8 oz (88.814 kg) (11/25 0534)    Intake/Output Summary (Last 24 hours) at 11/02/14 0745 Last data filed at 11/02/14 0036  Gross per 24 hour  Intake    960 ml  Output    350 ml  Net    610 ml    Physical Exam: General: Lying in bed in NAD Cardiovascular: RRR, no m/r/g noted. No JVD. 1+ DP and radial pulses bilaterally Respiratory:  CTAB without wheezess, crackles or rhonchi; normal WOB on RA Abdomen: +BS, soft, ND/NT Extremities: No deformities. Trace pitting edema bilaterally. Neuro: A&O, no gross abnormalities  Laboratory:  Recent Labs Lab 10/31/14 0320 11/01/14 0301 11/02/14 0431  WBC 7.3 9.0 9.0  HGB 9.9* 9.3* 9.5*  HCT 31.4* 28.9* 29.1*  PLT 178 173 206    Recent Labs Lab 10/31/14 0320 11/01/14 0301 11/02/14 0431  NA 139 133* 135*  K 4.6 4.5 4.2  CL 99 92* 95*  CO2 20 22 21   BUN 59* 77* 94*  CREATININE 3.25* 3.47* 3.63*  CALCIUM 9.3 9.1 8.8  GLUCOSE 239* 277* 286*  Anion gap: 19   ProBNP 12,267 Point-of-care troponin 0.01  Imaging/Diagnostic Tests: 2 view chest x-ray 10/28/2014: Stable chest radiograph findings. No acute disease.  EKG with no discrete ischemic signs by with first-degree heart block, slight right axis deviation, and T-wave inversion in V4 through V6  TTE: EF 60-65% . Grade 3 diastolic dysfunction. PA peak pressure 74mmHg  Archie Patten, MD 11/02/2014, 7:45 AM PGY-1, [place] Intern pager: [phone], text pages welcome

## 2014-11-02 NOTE — Progress Notes (Signed)
Physical Therapy Treatment Patient Details Name: Kristi [name] MRN: [id number] DOB: [birth year] Today's Date: 11/02/2014    History of Present Illness Pt adm with CHF and COPD exacerbation. PMH is significant for CKD stage IV, diastolic CHF, HTN, T2 DM, COPD, DVT, CVA    PT Comments    Pt continues to demo improvements in mobility and ability to demo pursed lip breathing during mobility.  Pt needed 4L O2 during ambulation to maintain O2 sats in low 90's.  Will continue to follow while on acute.    Follow Up Recommendations  Home health PT;Supervision - Intermittent     Equipment Recommendations  None recommended by PT    Recommendations for Other Services       Precautions / Restrictions Precautions Precautions: Fall Precaution Comments: Watch O2 sats. Restrictions Weight Bearing Restrictions: No    Mobility  Bed Mobility               General bed mobility comments: pt sitting on 3-in-1.    Transfers Overall transfer level: Modified independent Equipment used: None                Ambulation/Gait Ambulation/Gait assistance: Supervision Ambulation Distance (Feet): 180 Feet Assistive device: Rolling walker (2 wheeled) Gait Pattern/deviations: Step-through pattern;Decreased stride length     General Gait Details: pt moves slowly and does well performing pursed lip breathing with ambulation.  pt on 4L O2 throughout ambulation with O2 sats remaining in low 90's.     Stairs            Wheelchair Mobility    Modified Rankin (Stroke Patients Only)       Balance Overall balance assessment: Needs assistance Sitting-balance support: No upper extremity supported;Feet supported Sitting balance-Leahy Scale: Good     Standing balance support: No upper extremity supported Standing balance-Leahy Scale: Fair Standing balance comment: pt needs UE support for any balance challenges.                      Cognition Arousal/Alertness:  Awake/alert Behavior During Therapy: WFL for tasks assessed/performed Overall Cognitive Status: Within Functional Limits for tasks assessed                      Exercises      General Comments        Pertinent Vitals/Pain Pain Assessment: No/denies pain    Home Living                      Prior Function            PT Goals (current goals can now be found in the care plan section) Acute Rehab PT Goals PT Goal Formulation: With patient Time For Goal Achievement: 11/08/14 Potential to Achieve Goals: Good Progress towards PT goals: Progressing toward goals    Frequency  Min 3X/week    PT Plan Current plan remains appropriate    Co-evaluation             End of Session Equipment Utilized During Treatment: Oxygen Activity Tolerance: Patient limited by fatigue Patient left: in bed;with call bell/phone within reach     Time: 1013-1035 PT Time Calculation (min) (ACUTE ONLY): 22 min  Charges:  $Gait Training: 8-22 mins                    G CodesCatarina Hartshorn, [place] 11/02/2014, 1:57 PM

## 2014-11-02 NOTE — Discharge Instructions (Signed)

## 2014-11-03 LAB — CBC
HCT: 28.9 % — ABNORMAL LOW (ref 36.0–46.0)
HEMOGLOBIN: 9.2 g/dL — AB (ref 12.0–15.0)
MCH: 28.3 pg (ref 26.0–34.0)
MCHC: 31.8 g/dL (ref 30.0–36.0)
MCV: 88.9 fL (ref 78.0–100.0)
Platelets: 210 10*3/uL (ref 150–400)
RBC: 3.25 MIL/uL — ABNORMAL LOW (ref 3.87–5.11)
RDW: 16.6 % — ABNORMAL HIGH (ref 11.5–15.5)
WBC: 8.8 10*3/uL (ref 4.0–10.5)

## 2014-11-03 LAB — BASIC METABOLIC PANEL
Anion gap: 19 — ABNORMAL HIGH (ref 5–15)
BUN: 99 mg/dL — AB (ref 6–23)
CO2: 21 mEq/L (ref 19–32)
Calcium: 9.1 mg/dL (ref 8.4–10.5)
Chloride: 99 mEq/L (ref 96–112)
Creatinine, Ser: 3.4 mg/dL — ABNORMAL HIGH (ref 0.50–1.10)
GFR calc Af Amer: 14 mL/min — ABNORMAL LOW (ref >90)
GFR calc non Af Amer: 12 mL/min — ABNORMAL LOW (ref >90)
GLUCOSE: 121 mg/dL — AB (ref 70–99)
Potassium: 3.8 mEq/L (ref 3.7–5.3)
Sodium: 139 mEq/L (ref 137–147)

## 2014-11-03 LAB — GLUCOSE, CAPILLARY
Glucose-Capillary: 127 mg/dL — ABNORMAL HIGH (ref 70–99)
Glucose-Capillary: 131 mg/dL — ABNORMAL HIGH (ref 70–99)

## 2014-11-03 MED ORDER — LORATADINE 10 MG PO TABS
10.0000 mg | ORAL_TABLET | Freq: Every day | ORAL | Status: DC
  Administered 2014-11-03: 10 mg via ORAL
  Filled 2014-11-03: qty 1

## 2014-11-03 NOTE — Progress Notes (Signed)
Patient is being discharged to home with home O2. Daughter is at bedside to assist her home. Home O2 tank delivered and at bedside. DC IV, DC tele, all discharge instructions explained, patient verbalizes understanding.

## 2014-11-03 NOTE — Progress Notes (Signed)
SUBJECTIVE:  No complaints. Feels well today.  Marland Kitchen allopurinol  200 mg Oral Daily  . amLODipine  5 mg Oral Daily  . antiseptic oral rinse  7 mL Mouth Rinse BID  . aspirin EC  81 mg Oral Daily  . atorvastatin  40 mg Oral q1800  . darbepoetin (ARANESP) injection - NON-DIALYSIS  100 mcg Subcutaneous Q Sat-1800  . doxycycline  100 mg Oral Q12H  . furosemide  80 mg Oral Daily  . heparin  5,000 Units Subcutaneous 3 times per day  . insulin aspart  0-20 Units Subcutaneous TID WC  . insulin aspart  0-5 Units Subcutaneous QHS  . insulin glargine  13 Units Subcutaneous QHS  . metoprolol succinate  12.5 mg Oral Daily  . mometasone-formoterol  2 puff Inhalation BID  . omega-3 acid ethyl esters  1 g Oral Daily  . pantoprazole  40 mg Oral Daily  . sodium chloride  3 mL Intravenous Q12H    OBJECTIVE:   Vitals:   Filed Vitals:   11/02/14 1840 11/02/14 2038 11/02/14 2043 11/03/14 0516  BP: 124/54  126/51 121/54  Pulse: 70 71 69 70  Temp: 98 F (36.7 C)  98.1 F (36.7 C) 98.6 F (37 C)  TempSrc: Oral  Oral Oral  Resp: 22 22 20 18   Height:      Weight:    194 lb 14.4 oz (88.406 kg)  SpO2: 97% 95% 100% 95%   I&O's:    Intake/Output Summary (Last 24 hours) at 11/03/14 0855 Last data filed at 11/03/14 9937  Gross per 24 hour  Intake    840 ml  Output   1050 ml  Net   -210 ml   TELEMETRY: Reviewed telemetry pt in NSR:   PHYSICAL EXAM General: Well developed, well nourished, in no acute distress. obese Head:   Normal cephalic and atramatic  Lungs:  Mild bibasilar rales Heart:  HRRR S1 S2  No appreciated JVD.   Abdomen: abdomen soft and non-tender Msk:  Back normal,  Normal strength and tone for age. Extremities:  No edema.   Neuro: Alert and oriented. Psych:  Normal affect, responds appropriately Skin: No rash    LABS: Basic Metabolic Panel:  Recent Labs  11/02/14 0431 11/03/14 0342  NA 135* 139  K 4.2 3.8  CL 95* 99  CO2 21 21  GLUCOSE 286* 121*  BUN 94* 99*    CREATININE 3.63* 3.40*  CALCIUM 8.8 9.1   CBC:  Recent Labs  11/02/14 0431 11/03/14 0342  WBC 9.0 8.8  HGB 9.5* 9.2*  HCT 29.1* 28.9*  MCV 90.9 88.9  PLT 206 210    Anemia Panel:  Recent Labs  10/31/14 1305  TIBC 356  IRON 15*   Coag Panel:   Lab Results  Component Value Date   INR 1.02 10/23/2011   INR 1.22 05/09/2010   INR 1.0 06/23/2007    RADIOLOGY: Dg Chest 2 View  10/30/2014   CLINICAL DATA:  Coughing up mucus, shortness of breath which began 2 weeks ago, history COPD, CHF, GERD, hypertension, diabetes, 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  Atherosclerotic calcification aorta.  No definite acute infiltrate or pneumothorax.  Tiny effusions blunt the posterior costophrenic angles.  Bones demineralized.  Numerous EKG leads project over chest.  IMPRESSION: Enlargement of cardiac silhouette with pulmonary vascular congestion.  Tiny bibasilar pleural effusions.  Electronically Signed   By: Lavonia Dana M.D.   On: 10/30/2014 13:33   Dg Chest 2 View  10/28/2014   CLINICAL DATA:  Shortness of breath and COPD. History of smoking. Chest pain.  EXAM: CHEST  2 VIEW  COMPARISON:  10/30/2012  FINDINGS: Coarse lung markings are similar to the previous examination. No focal airspace disease. Heart size is upper limits of normal but unchanged. Atherosclerotic calcifications at the aortic arch. No acute bone abnormality.  IMPRESSION: Stable chest radiograph findings.  No acute disease.   Electronically Signed   By: Markus Daft M.D.   On: 10/28/2014 13:29     ASSESSMENT/PLAN:     Kristi [name] is a 73 y.o. female with a history of CKD stage IV, diastolic CHF, HTN, T2 DM, COPD, DVT, CVA and hyperlipidemia who presented to [facility] on 10/28/14 with progressive dyspnea x 2 weeks found to be volume overloaded.    Acute on chronic diastolic HF, Pulmonary HTN -  Right greater than left sided acute diastolic HF.  - Crea worsening yesterday, but now improving with reduced dose of lasix. - would send home on Lasix 80 mg po daily - weight at baseline dry weight, the patient is euvolemic - Net neg 6 L, weight down 8 lbs.   - Dr. Percival Spanish suggested that she go home on O2 at least for some period of time after discharge.  At this point there is no role for medications for pulmonary HTN.  However, if over time she remains O2 dependent with close volume management (plus or minus dialysis in the future) we could consider this.        COLD (chronic obstructive lung disease) - Improved - Per primary - Ambulate patient and document oxygen needs. She sats at 87% on RA while ambulating which means she qualifies for home 02.     Essential hypertension, benign - Well controlled    Type 2 diabetes mellitus with complication -hyperglycemic on prednisone - Management per primary    Chronic kidney disease (CKD), stage IV (severe) - Baseline SCr ~2.8, with diuresis trended up to 3.63.  Nephrology signed off.   Dispo- Home today, we have arranged follow up with Dr Irish Lack and an early follow up on Dec 2 with Truitt Merle.    [name], MD  11/03/2014  8:55 AM

## 2014-11-09 ENCOUNTER — Encounter: Payer: Self-pay | Admitting: Family Medicine

## 2014-11-09 ENCOUNTER — Ambulatory Visit (INDEPENDENT_AMBULATORY_CARE_PROVIDER_SITE_OTHER): Payer: Commercial Managed Care - HMO | Admitting: Family Medicine

## 2014-11-09 ENCOUNTER — Ambulatory Visit (INDEPENDENT_AMBULATORY_CARE_PROVIDER_SITE_OTHER): Payer: Commercial Managed Care - HMO | Admitting: Nurse Practitioner

## 2014-11-09 ENCOUNTER — Encounter: Payer: Self-pay | Admitting: Nurse Practitioner

## 2014-11-09 VITALS — BP 110/48 | HR 62 | Ht 60.0 in | Wt 186.8 lb

## 2014-11-09 VITALS — BP 118/56 | HR 68 | Temp 97.8°F | Ht 60.0 in | Wt 186.0 lb

## 2014-11-09 DIAGNOSIS — N184 Chronic kidney disease, stage 4 (severe): Secondary | ICD-10-CM

## 2014-11-09 DIAGNOSIS — I5032 Chronic diastolic (congestive) heart failure: Secondary | ICD-10-CM

## 2014-11-09 DIAGNOSIS — J441 Chronic obstructive pulmonary disease with (acute) exacerbation: Secondary | ICD-10-CM

## 2014-11-09 DIAGNOSIS — Z720 Tobacco use: Secondary | ICD-10-CM

## 2014-11-09 DIAGNOSIS — R0600 Dyspnea, unspecified: Secondary | ICD-10-CM

## 2014-11-09 DIAGNOSIS — I509 Heart failure, unspecified: Secondary | ICD-10-CM

## 2014-11-09 DIAGNOSIS — I272 Pulmonary hypertension, unspecified: Secondary | ICD-10-CM

## 2014-11-09 NOTE — Progress Notes (Signed)
Patient ID: Kristi [name], female   DOB: [birth year], 73 y.o.   MRN: [id number]   HPI  Patient presents today for hospital follow-up  Patient was admitted to the hospital for multifactorial dyspnea treated as a CHF exacerbation as well as COPD exacerbation. Since leaving the hospital she has continued to breathe easier. She states that she still has dyspnea but it's much better than it was prior to admission. She's having brisk diuresis from her daily Lasix. Her weight is down from 192.6 pounds on discharge to 186.2 pounds on her home scale.  She has finished her prednisone and antibiotics for COPD exacerbation. She also has Dulera at home and was given a sample when she left the hospital.  Smoking status noted ROS: Per HPI  Objective: BP 118/56 mmHg  Pulse 68  Temp(Src) 97.8 F (36.6 C) (Oral)  Ht 5' (1.524 m)  Wt 186 lb (84.369 kg)  BMI 36.33 kg/m2   O2 saturation 98% on 2 L of oxygen via [place]  Gen: NAD, alert, cooperative with exam HEENT: NCAT CV: RRR, good S1/S2 Resp: CTABL, no wheezes, non-labored Ext: No pitting edema Neuro: Alert and oriented, No gross deficits  Assessment and plan:  COPD exacerbation Resolved, respirations nonlabored now Has finished antibiotics and prednisone Continue Dulera, was given sample when she left the hospital  CHF exacerbation Resolving Weight down from 192.6 to 186.2 since she got home. Continue 80 mg Lasix daily Has cardiology follow-up today ARB was held on DC from the hospital due to her rising creatinine, would try to restart this at a low dose but would defer to cardiology and nephrology. Recheck BMP today  Chronic kidney disease (CKD), stage IV (severe) Creatinine baseline appears to be around 3 now, elevated to 3.4 on DC from the hospital Possibly due to diuresis Recheck BMP today, consider adding back ARB but this is a difficult decision with such severe renal failure so would like a nephrology's opinion on that as  well.   Orders Placed This Encounter  Procedures  . Basic Metabolic Panel  . CBC with Differential

## 2014-11-09 NOTE — Patient Instructions (Signed)
Great to see you today!  I am glad you are doing so much better.   Continue your current medicines, come back to see Dr. Alease Frame in 1 month  Be sure to keep your cardiology appt this afternoon.

## 2014-11-09 NOTE — Assessment & Plan Note (Signed)
Resolved, respirations nonlabored now Has finished antibiotics and prednisone Continue Dulera, was given sample when she left the hospital

## 2014-11-09 NOTE — Assessment & Plan Note (Signed)
Resolving Weight down from 192.6 to 186.2 since she got home. Continue 80 mg Lasix daily Has cardiology follow-up today ARB was held on DC from the hospital due to her rising creatinine, would try to restart this at a low dose but would defer to cardiology and nephrology. Recheck BMP today

## 2014-11-09 NOTE — Progress Notes (Addendum)
Kristi [name] Date of Birth: 1941/04/20 Medical Record #[id number]  History of Present Illness: Kristi [name] is seen back today for a post hospital visit/TOC - seen for Dr. Irish Lack. She is a 73 yo female with a history of HLD, CAD, tobacco abuse (Quit April 2015), HTN, COPD, DM, CKD, GERD, Chronic CHF, DVT, CVA. 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 Her last 2D echo was 10/2012 and her EF was 55-60%, mild MR, RA mod dilation, Peak PA pressure 70mmHg.   She was recently admitted with dyspnea on exertion and weight gain. Seen in consultation by Dr. Irish Lack. Her dyspnea was felt to be multifactorial - including deconditioning, pulmonary HTN and volume overload. Echo with grade 3 diastolic dysfunction and increased PA pressure to 96. Placed on home oxygen. Did have worsening renal failure - nephrology was aware. ARB was stopped but was to be reconsidered.   Discharged a week ago.   Comes in today. Here alone. Has her oxygen in place. Says she is doing better.  Denies smoking -  But took 2 drags off her vapor. Breathing ok. No chest pain. Ok on her medicines. No real issues since she has been home. She has seen her PCP earlier today. Sees Dr. Lorrene Reid later this month as well.   Current Outpatient Prescriptions  Medication Sig Dispense Refill  . acetaminophen (TYLENOL) 500 MG tablet Take 1,000 mg by mouth every 6 (six) hours as needed.    Marland Kitchen acetaminophen-codeine (TYLENOL #3) 300-30 MG per tablet 1 tablet every 6 (six) hours as needed for moderate pain.     Marland Kitchen albuterol (PROVENTIL HFA) 108 (90 BASE) MCG/ACT inhaler Inhale 2 puffs into the lungs every 4 (four) hours as needed. For wheezing 8.5 g 2  . allopurinol (ZYLOPRIM) 100 MG tablet Take 2 tablets (200 mg total) by mouth daily. 60 tablet 1  . amLODipine (NORVASC) 5 MG tablet Take 1 tablet (5 mg total) by mouth daily. 90 tablet 3  . aspirin 325 MG tablet Take 325 mg by mouth at bedtime.     . benzonatate (TESSALON) 100 MG  capsule Take 1 capsule (100 mg total) by mouth 3 (three) times daily as needed for cough. 20 capsule 0  . [employer] ALLERGY RELIEF 180 MG tablet TAKE 1 TABLET BY MOUTH EVERY DAY 90 tablet 3  . doxycycline (VIBRA-TABS) 100 MG tablet Take 1 tablet (100 mg total) by mouth every 12 (twelve) hours. Starting tonight 7 tablet 0  . furosemide (LASIX) 80 MG tablet Take 1 tablet (80 mg total) by mouth daily. 30 tablet 0  . glipiZIDE (GLUCOTROL XL) 5 MG 24 hr tablet Take 1 tablet (5 mg total) by mouth daily with breakfast. (Patient taking differently: Take 5 mg by mouth every evening. ) 30 tablet 11  . glucosamine-chondroitin 500-400 MG tablet Take 1 tablet by mouth 2 (two) times daily.      Marland Kitchen HYDROcodone-acetaminophen (NORCO/VICODIN) 5-325 MG per tablet Take 1 tablet by mouth every 12 (twelve) hours as needed for moderate pain. 60 tablet 0  . insulin glargine (LANTUS) 100 UNIT/ML injection Inject 0.13 mLs (13 Units total) into the skin every morning. If blood sugar is >140 add 1 unit. If blood sugar is <140 subtract 1 unit. 10 mL 2  . metoprolol succinate (TOPROL-XL) 25 MG 24 hr tablet Take 0.5 tablets (12.5 mg total) by mouth daily. 90 tablet 3  . mometasone-formoterol (DULERA) 100-5 MCG/ACT AERO Inhale 2 puffs into the lungs 2 (two) times  daily. 13 g 1  . nitroGLYCERIN (NITROSTAT) 0.4 MG SL tablet Place 0.4 mg under the tongue every 5 (five) minutes as needed. For chest pain     Call 911 if pain not relieved by 3rd tab    . NON FORMULARY Place 2 L into the nose daily. 2 liters of o2    . Omega-3 Fatty Acids (FISH OIL) 1200 MG CAPS Take 2 capsules by mouth 2 (two) times daily.    Marland Kitchen omeprazole (PRILOSEC) 20 MG capsule TAKE ONE CAPSULE BY MOUTH AT BEDTIME 90 capsule 3  . oxyCODONE-acetaminophen (PERCOCET/ROXICET) 5-325 MG per tablet Take 1 tablet by mouth 3 (three) times daily as needed for severe pain.    Vladimir Faster Glycol-Propyl Glycol (SYSTANE OP) Apply 1-2 drops to eye 4 (four) times daily as needed. For dry eyes     . pravastatin (PRAVACHOL) 40 MG tablet Take 1 tablet (40 mg total) by mouth daily. (Patient taking differently: Take 40 mg by mouth every evening. ) 90 tablet 3  . predniSONE (DELTASONE) 50 MG tablet Take 1 tablet (50 mg total) by mouth daily with breakfast. 1 tablet 0  . Tiotropium Bromide Monohydrate (SPIRIVA RESPIMAT) 2.5 MCG/ACT AERS Inhale 2 puffs once daily 1 Inhaler 11  . vitamin B-12 (CYANOCOBALAMIN) 1000 MCG tablet Take 1,000 mcg by mouth every morning.      No current facility-administered medications for this visit.    Allergies  Allergen Reactions  . Losartan Other (See Comments)    Causes increase in creatinine and worsening CKD  . Varenicline Nausea Only and Other (See Comments)    Dizzy and GI symptoms     Past Medical History  Diagnosis Date  . Hyperlipidemia   . Hyperparathyroidism   . CAD (coronary artery disease)   . Myocardial infarction 2008  . Hypertension   . COPD (chronic obstructive pulmonary disease)   . Diabetes mellitus     diagnosed 2010 - takes oral meds and lantus insulin  . Anemia   . Chronic kidney disease     esrd - since 2010  . GERD (gastroesophageal reflux disease)     on omeprazole  . Arthritis     all over  . CHF (congestive heart failure) 08-2011  . CVA 02/19/2010  . CAD 02/19/2010  . DEEP VENOUS THROMBOPHLEBITIS, LEG, LEFT 03/08/2010    in 2009 s/p hospitalization- only 1 isolated dvt  . Stroke ?2010    found remote CVA on CT scan  . Pulmonary artery hypertension 10/31/2014  . COPD exacerbation 10/31/2014    Past Surgical History  Procedure Laterality Date  . Av fistula placement  04/05/11    Left Radiocephalic AVF  . Appendectomy  1969  . Tubal ligation    . Revision of fisturl    . Coronary angioplasty with stent placement  06/23/2007    by Dr. Irish Lack is her cardiologist  . Ligation goretex fistula  10/29/2011    Procedure: LIGATION GORE-TEX FISTULA;  Surgeon: Elam Dutch, MD;  Location: [facility] OR;  Service: Vascular;   Laterality: Left;  Ligation of competing branches left forearm fistula    History  Smoking status  . Former Smoker -- 0.50 packs/day for 53 years  . Types: Cigarettes  . Start date: 12/09/1960  . Quit date: 03/31/2014  Smokeless tobacco  . Never Used    History  Alcohol Use No    Family History  Problem Relation Age of Onset  . Cancer Mother     multiple myloma  .  Dementia Father   . Pneumonia Father   . Anuerysm Father     Review of Systems: The review of systems is per the HPI.  All other systems were reviewed and are negative.  Physical Exam: BP 110/48 mmHg  Pulse 62  Ht 5' (1.524 m)  Wt 186 lb 12.8 oz (84.732 kg)  BMI 36.48 kg/m2  SpO2 97% Patient is very pleasant and in no acute distress. Skin is warm and dry. Color is normal.  HEENT is unremarkable. Normocephalic/atraumatic. PERRL. Sclera are nonicteric. Neck is supple. No masses. No JVD. Lungs are clear. Cardiac exam shows a regular rate and rhythm. Abdomen is soft. Extremities are without edema. Gait and ROM are intact. No gross neurologic deficits noted.  Wt Readings from Last 3 Encounters:  11/09/14 186 lb 12.8 oz (84.732 kg)  11/09/14 186 lb (84.369 kg)  11/03/14 194 lb 14.4 oz (88.406 kg)    LABORATORY DATA/PROCEDURES:  Lab Results  Component Value Date   WBC 8.8 11/03/2014   HGB 9.2* 11/03/2014   HCT 28.9* 11/03/2014   PLT 210 11/03/2014   GLUCOSE 121* 11/03/2014   CHOL 157 09/30/2014   TRIG 166* 09/30/2014   HDL 32* 09/30/2014   LDLCALC 92 09/30/2014   ALT 10 10/31/2012   AST 12 10/31/2012   NA 139 11/03/2014   K 3.8 11/03/2014   CL 99 11/03/2014   CREATININE 3.40* 11/03/2014   BUN 99* 11/03/2014   CO2 21 11/03/2014   TSH 1.993 08/12/2012   INR 1.02 10/23/2011   HGBA1C 6.7 09/16/2014    BNP (last 3 results)  Recent Labs  10/28/14 1255  PROBNP 12267.0*    Echo Study Conclusions from November 2015-   Left ventricle: The cavity size was normal. Wall thickness was increased  in a pattern of moderate LVH. Systolic function was normal. The estimated ejection fraction was in the range of 60% to 65%. Wall motion was normal; there were no regional wall motion abnormalities. Doppler parameters are consistent with a reversible restrictive pattern, indicative of decreased left ventricular diastolic compliance and/or increased left atrial pressure (grade 3 diastolic dysfunction). - Mitral valve: There was mild regurgitation. - Left atrium: The atrium was moderately dilated. - Right atrium: The atrium was moderately dilated. - Pulmonary arteries: Systolic pressure was severely increased. PA peak pressure: 96 mm Hg (S).  Assessment / Plan: 1. Diastolic HF - her weight is down. Back on her home dose Lasix. Doing better clinically. Reminded of the importance of salt restriction. See back in about 6 weeks.   2. HTN -  BP ok today.   3. Pulmonary HTN - on oxygen.   4. Deconditioning   5. Tobacco abuse - hopefully she will be able to abstain  6. CKD - remains off of her ARB - seeing Ren[facility] later this month - will defer to them but BP ok for now.   She is improved. No change in her current medicines. See back in 6 weeks and then hope to get her back on her usual schedule.   Patient is agreeable to this plan and will call if any problems develop in the interim.   Burtis Junes, RN, [facility] [address] [place], [place]  [id number] [phone]

## 2014-11-09 NOTE — Patient Instructions (Signed)
Stay on your current medicines  See Dr. Irish Lack in about 6 weeks  Keep restricting your salt  Keep wearing your oxygen  Call the [facility] office at [phone] if you have any questions, problems or concerns.

## 2014-11-09 NOTE — Assessment & Plan Note (Signed)
Creatinine baseline appears to be around 3 now, elevated to 3.4 on DC from the hospital Possibly due to diuresis Recheck BMP today, consider adding back ARB but this is a difficult decision with such severe renal failure so would like a nephrology's opinion on that as well.

## 2014-11-10 ENCOUNTER — Telehealth: Payer: Self-pay | Admitting: Family Medicine

## 2014-11-10 LAB — BASIC METABOLIC PANEL
BUN: 67 mg/dL — ABNORMAL HIGH (ref 6–23)
CO2: 28 mEq/L (ref 19–32)
Calcium: 9.7 mg/dL (ref 8.4–10.5)
Chloride: 104 mEq/L (ref 96–112)
Creat: 2.61 mg/dL — ABNORMAL HIGH (ref 0.50–1.10)
GLUCOSE: 84 mg/dL (ref 70–99)
POTASSIUM: 3.7 meq/L (ref 3.5–5.3)
SODIUM: 144 meq/L (ref 135–145)

## 2014-11-10 LAB — CBC WITH DIFFERENTIAL/PLATELET
BASOS PCT: 0 % (ref 0–1)
Basophils Absolute: 0 10*3/uL (ref 0.0–0.1)
Eosinophils Absolute: 0.3 10*3/uL (ref 0.0–0.7)
Eosinophils Relative: 3 % (ref 0–5)
HEMATOCRIT: 34.5 % — AB (ref 36.0–46.0)
HEMOGLOBIN: 10.7 g/dL — AB (ref 12.0–15.0)
LYMPHS ABS: 1.4 10*3/uL (ref 0.7–4.0)
LYMPHS PCT: 17 % (ref 12–46)
MCH: 28.5 pg (ref 26.0–34.0)
MCHC: 31 g/dL (ref 30.0–36.0)
MCV: 91.8 fL (ref 78.0–100.0)
MONO ABS: 0.4 10*3/uL (ref 0.1–1.0)
MONOS PCT: 5 % (ref 3–12)
MPV: 11.2 fL (ref 9.4–12.4)
NEUTROS ABS: 6.4 10*3/uL (ref 1.7–7.7)
Neutrophils Relative %: 75 % (ref 43–77)
Platelets: 223 10*3/uL (ref 150–400)
RBC: 3.76 MIL/uL — ABNORMAL LOW (ref 3.87–5.11)
RDW: 17.7 % — ABNORMAL HIGH (ref 11.5–15.5)
WBC: 8.5 10*3/uL (ref 4.0–10.5)

## 2014-11-10 NOTE — Telephone Encounter (Signed)
Called to discus labs.   Her creatinine has improved as well as her hemoglobin. I advised her to continue holding her losartan due to very well controlled BPs at the visits yesterday.   She may need to restart it in the future. Given that this is diastolic CHF I don't feel it is a requirement and it could be the first med added back should she become hypertensive again.   Laroy Apple, MD [place] Resident, PGY-3 11/10/2014, 8:54 AM

## 2014-11-17 ENCOUNTER — Encounter (HOSPITAL_COMMUNITY): Payer: Self-pay | Admitting: Vascular Surgery

## 2014-11-18 ENCOUNTER — Telehealth: Payer: Self-pay | Admitting: *Deleted

## 2014-11-18 NOTE — Telephone Encounter (Signed)
[employer] referral paced, status currently suspended. Will check back for authorization.

## 2014-11-18 NOTE — Telephone Encounter (Signed)
Kristi [name] from [place] called needing a Human Referral placed.  Pt has an upcoming appt 11/21/2014 with Dr. Jamal Maes.  Providers NPI number [id number]; please use previous ICD code.  Previous referral expired on 11/14/2014.  Derl Barrow, RN

## 2014-11-21 ENCOUNTER — Encounter: Payer: Self-pay | Admitting: Family Medicine

## 2014-11-21 NOTE — Progress Notes (Unsigned)
Patient ID: Kristi [name], female   DOB: Nov 29, 1941, 73 y.o.   MRN: [id number] Certificate Of Medical Necessity for Home Oxygen Therapy, 2 left/min via [employer], form completed and fax'd to [facility] form scan to be scanned into patient's EMR.

## 2014-12-02 DIAGNOSIS — R0600 Dyspnea, unspecified: Secondary | ICD-10-CM

## 2014-12-02 DIAGNOSIS — I5032 Chronic diastolic (congestive) heart failure: Secondary | ICD-10-CM

## 2014-12-02 DIAGNOSIS — I270 Primary pulmonary hypertension: Secondary | ICD-10-CM

## 2014-12-11 ENCOUNTER — Other Ambulatory Visit: Payer: Self-pay | Admitting: Family Medicine

## 2014-12-13 ENCOUNTER — Telehealth: Payer: Self-pay | Admitting: Interventional Cardiology

## 2014-12-13 NOTE — Telephone Encounter (Signed)
Called [employer] and left message for Nicole Kindred that oxygen was ordered by [facility] and therefore the order should not be signed by Dr. Irish Lack. I advised him to call our office with questions or concerns.

## 2014-12-13 NOTE — Telephone Encounter (Signed)
New Msg        Radene Knee calling from [employer] to see if prescription for pt oxygen has been received.   States he faxed today please contact at [phone].

## 2014-12-14 ENCOUNTER — Telehealth: Payer: Self-pay | Admitting: Family Medicine

## 2014-12-14 NOTE — Telephone Encounter (Signed)
Pt called because she was told by Dr. Wendi Snipes to come back in a month to see Dr. Alease Frame. The problem is Dr. Alease Frame doesn't have anything until February. She has some questions and really would like to speak to one of the doctor to see what should she do. jw

## 2014-12-15 ENCOUNTER — Telehealth: Payer: Self-pay | Admitting: *Deleted

## 2014-12-15 DIAGNOSIS — E1165 Type 2 diabetes mellitus with hyperglycemia: Secondary | ICD-10-CM

## 2014-12-15 DIAGNOSIS — E113599 Type 2 diabetes mellitus with proliferative diabetic retinopathy without macular edema, unspecified eye: Secondary | ICD-10-CM | POA: Insufficient documentation

## 2014-12-15 NOTE — Telephone Encounter (Signed)
Spoke with ms Schnackenberg and she stated that she needs a rx for a Marine scientist. Asked dr Wendi Snipes for his opinion and he said that she need [facility] to send [place] some papers. Called her back and she doesn't use [facility] but she uses [facility]. So i gave her out fax number so that they can fax [place] papers. Deseree Kennon Holter, CMA

## 2014-12-15 NOTE — Telephone Encounter (Signed)
Kristi [name] with Dr. Radene Ou office called stating that patient has an appointment with them for her annual diabetic eye exam and needs a referral for  [facility]. [employer] auth obtained #[id number], will forward to PCP to place optho referral.

## 2014-12-19 ENCOUNTER — Ambulatory Visit (INDEPENDENT_AMBULATORY_CARE_PROVIDER_SITE_OTHER): Payer: Commercial Managed Care - HMO | Admitting: Family Medicine

## 2014-12-19 ENCOUNTER — Encounter: Payer: Self-pay | Admitting: Family Medicine

## 2014-12-19 ENCOUNTER — Telehealth: Payer: Self-pay | Admitting: Family Medicine

## 2014-12-19 VITALS — BP 120/67 | HR 80 | Temp 97.6°F | Ht 60.0 in | Wt 192.0 lb

## 2014-12-19 DIAGNOSIS — E11311 Type 2 diabetes mellitus with unspecified diabetic retinopathy with macular edema: Secondary | ICD-10-CM

## 2014-12-19 DIAGNOSIS — I5032 Chronic diastolic (congestive) heart failure: Secondary | ICD-10-CM

## 2014-12-19 DIAGNOSIS — Z23 Encounter for immunization: Secondary | ICD-10-CM

## 2014-12-19 DIAGNOSIS — M545 Low back pain, unspecified: Secondary | ICD-10-CM | POA: Insufficient documentation

## 2014-12-19 DIAGNOSIS — G8929 Other chronic pain: Secondary | ICD-10-CM

## 2014-12-19 DIAGNOSIS — E118 Type 2 diabetes mellitus with unspecified complications: Secondary | ICD-10-CM

## 2014-12-19 DIAGNOSIS — N184 Chronic kidney disease, stage 4 (severe): Secondary | ICD-10-CM

## 2014-12-19 DIAGNOSIS — M549 Dorsalgia, unspecified: Secondary | ICD-10-CM

## 2014-12-19 MED ORDER — GLUCOSE BLOOD VI STRP: 1.0000 | ORAL_STRIP | Freq: Two times a day (BID) | Status: DC

## 2014-12-19 MED ORDER — ACCU-CHEK SOFT TOUCH LANCETS MISC: 1.0000 | Freq: Two times a day (BID) | Status: DC

## 2014-12-19 MED ORDER — ACCU-CHEK NANO SMARTVIEW W/DEVICE KIT: 1.0000 | PACK | Status: DC | PRN

## 2014-12-19 NOTE — Telephone Encounter (Signed)
Referral sent 

## 2014-12-19 NOTE — Assessment & Plan Note (Signed)
Euvolemic Continue metoprolol, aspirin, pravastatin, amlodipine, Lasix 80 mg daily New Referral for cardiology written

## 2014-12-19 NOTE — Assessment & Plan Note (Signed)
Wrote referral for ophthalmology today

## 2014-12-19 NOTE — Patient Instructions (Signed)
Great tp see you again!  Please try to get in to see Dr. Alease Frame in the next 2 months  Call if we missed anything

## 2014-12-19 NOTE — Assessment & Plan Note (Signed)
Continued, needs new referral to ortho HAs had success with epidural uinjections

## 2014-12-19 NOTE — Assessment & Plan Note (Signed)
COPD at baseline, oxygen dependent Continue Spiriva, Dulera, when necessary albuterol

## 2014-12-19 NOTE — Assessment & Plan Note (Signed)
Per patient's report still very well Previous A1c as well Continue glipizide and Lantus, 13 units daily Follow-up in 2-3 months for A1c, with good control can perform every 3-6 months.

## 2014-12-19 NOTE — Telephone Encounter (Signed)
Pt seen in office today, was prescribed a portable concentrator but the company says the date was missing from the prescription.

## 2014-12-19 NOTE — Addendum Note (Signed)
Addended by: [place] Pink on: 12/19/2014 03:38 PM   Modules accepted: Orders

## 2014-12-19 NOTE — Progress Notes (Signed)
Patient ID: Kristi [name], female   DOB: 1941-11-13, 74 y.o.   MRN: [id number]   HPI  Patient presents today for follow-up diabetes and referrals  Diabetes Patient checking blood sugar every morning ranges 80-100 No symptoms of hypoglycemia but has seen one blood sugar of 66 Takes 10 units of Lantus daily, she holds the Lantus for blood sugars less than 100, if it is above 140 she takes 13 units Takes glipizide every day-previously she was taken off of glipizide and states that her blood sugars became very uncontrolled so she decided with her previous PCP to continue glipizide and Lantus  Referrals Needs neurology, cardiology, ophthalmology, orthopedic referral Orthopedic referral for chronic low back pain, has had good results with epidural injections and is in need of one again Ophthalmology-has diabetes with retinopathy and gets intraocular injections  Smoking status noted ROS: Per HPI  Objective: BP 120/67 mmHg  Pulse 80  Temp(Src) 97.6 F (36.4 C) (Oral)  Ht 5' (1.524 m)  Wt 192 lb (87.091 kg)  BMI 37.50 kg/m2  SpO2 95% Gen: NAD, alert, cooperative with exam HEENT: NCAT CV: RRR, good S1/S2, no murmur Resp: CTABL, no wheezes, non-labored on 2 L O2 via [place] Ext: 1-2+ pitting edema bilateral lower extremities Neuro: Alert and oriented, No gross deficits  Assessment and plan:  CHF (congestive heart failure) Euvolemic Continue metoprolol, aspirin, pravastatin, amlodipine, Lasix 80 mg daily New Referral for cardiology written  Chronic back pain Continued, needs new referral to ortho HAs had success with epidural uinjections  Type 2 diabetes mellitus with complication Per patient's report still very well Previous A1c as well Continue glipizide and Lantus, 13 units daily Follow-up in 2-3 months for A1c, with good control can perform every 3-6 months.  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 Ambulatory referral to Ophthalmology    Referral Priority:  Routine    Referral Type:  Consultation    Referral Reason:  Specialty Services Required    Requested Specialty:  Ophthalmology    Number of Visits Requested:  1  . Ambulatory referral to Nephrology    Referral Priority:  Routine    Referral Type:  Consultation    Referral Reason:  Specialty Services Required    Requested Specialty:  Nephrology    Number of Visits Requested:  1  . Ambulatory referral to Cardiology    Referral Priority:  Routine    Referral Type:  Consultation    Referral Reason:  Specialty Services Required    Requested Specialty:  Cardiology    Number of Visits Requested:  1  . Ambulatory referral to Orthopedic Surgery    Referral Priority:  Routine    Referral Type:  Surgical    Referral Reason:  Specialty Services Required    Requested Specialty:  Orthopedic Surgery    Number of Visits Requested:  1  . POCT HgB A1C    Meds ordered this encounter  Medications  . Blood Glucose Monitoring Suppl (ACCU-CHEK NANO SMARTVIEW) W/DEVICE KIT    Sig: 1 Device by Does not apply route as needed.    Dispense:  1 kit    Refill:  0  . Lancets (ACCU-CHEK SOFT TOUCH) lancets    Sig: 1 each by Other route 2 (two) times daily. Use as instructed    Dispense:  100 each    Refill:  12  . glucose blood (ACCU-CHEK SMARTVIEW) test strip    Sig: 1 each by Other route 2 (two) times daily. Use as instructed    Dispense:  100 each    Refill:  12

## 2014-12-22 NOTE — Telephone Encounter (Signed)
Talked with pt and she said company was shipping it. Rosalind Guido Kennon Holter, CMA

## 2014-12-30 ENCOUNTER — Ambulatory Visit: Payer: Commercial Managed Care - HMO | Admitting: Interventional Cardiology

## 2015-01-09 ENCOUNTER — Emergency Department (HOSPITAL_COMMUNITY): Payer: Commercial Managed Care - HMO

## 2015-01-09 ENCOUNTER — Encounter (HOSPITAL_COMMUNITY): Payer: Self-pay

## 2015-01-09 ENCOUNTER — Inpatient Hospital Stay (HOSPITAL_COMMUNITY)
Admission: EM | Admit: 2015-01-09 | Discharge: 2015-01-25 | DRG: 682 | Disposition: A | Discharge status: Skilled Nursing Facility | Payer: Commercial Managed Care - HMO | Attending: Family Medicine | Admitting: Family Medicine

## 2015-01-09 DIAGNOSIS — Z79899 Other long term (current) drug therapy: Secondary | ICD-10-CM

## 2015-01-09 DIAGNOSIS — R109 Unspecified abdominal pain: Secondary | ICD-10-CM

## 2015-01-09 DIAGNOSIS — D696 Thrombocytopenia, unspecified: Secondary | ICD-10-CM | POA: Diagnosis present

## 2015-01-09 DIAGNOSIS — E872 Acidosis: Secondary | ICD-10-CM | POA: Diagnosis present

## 2015-01-09 DIAGNOSIS — B9689 Other specified bacterial agents as the cause of diseases classified elsewhere: Secondary | ICD-10-CM | POA: Diagnosis present

## 2015-01-09 DIAGNOSIS — Z955 Presence of coronary angioplasty implant and graft: Secondary | ICD-10-CM

## 2015-01-09 DIAGNOSIS — R791 Abnormal coagulation profile: Secondary | ICD-10-CM | POA: Diagnosis present

## 2015-01-09 DIAGNOSIS — R001 Bradycardia, unspecified: Secondary | ICD-10-CM | POA: Diagnosis not present

## 2015-01-09 DIAGNOSIS — K219 Gastro-esophageal reflux disease without esophagitis: Secondary | ICD-10-CM | POA: Diagnosis present

## 2015-01-09 DIAGNOSIS — J449 Chronic obstructive pulmonary disease, unspecified: Secondary | ICD-10-CM | POA: Diagnosis present

## 2015-01-09 DIAGNOSIS — I5032 Chronic diastolic (congestive) heart failure: Secondary | ICD-10-CM | POA: Diagnosis present

## 2015-01-09 DIAGNOSIS — Z992 Dependence on renal dialysis: Secondary | ICD-10-CM

## 2015-01-09 DIAGNOSIS — I4891 Unspecified atrial fibrillation: Secondary | ICD-10-CM

## 2015-01-09 DIAGNOSIS — Z7982 Long term (current) use of aspirin: Secondary | ICD-10-CM | POA: Diagnosis not present

## 2015-01-09 DIAGNOSIS — R600 Localized edema: Secondary | ICD-10-CM

## 2015-01-09 DIAGNOSIS — N179 Acute kidney failure, unspecified: Secondary | ICD-10-CM | POA: Diagnosis present

## 2015-01-09 DIAGNOSIS — I120 Hypertensive chronic kidney disease with stage 5 chronic kidney disease or end stage renal disease: Secondary | ICD-10-CM | POA: Diagnosis present

## 2015-01-09 DIAGNOSIS — R7989 Other specified abnormal findings of blood chemistry: Secondary | ICD-10-CM

## 2015-01-09 DIAGNOSIS — Z86718 Personal history of other venous thrombosis and embolism: Secondary | ICD-10-CM

## 2015-01-09 DIAGNOSIS — E785 Hyperlipidemia, unspecified: Secondary | ICD-10-CM | POA: Diagnosis present

## 2015-01-09 DIAGNOSIS — J9601 Acute respiratory failure with hypoxia: Secondary | ICD-10-CM | POA: Diagnosis present

## 2015-01-09 DIAGNOSIS — E875 Hyperkalemia: Secondary | ICD-10-CM

## 2015-01-09 DIAGNOSIS — I4581 Long QT syndrome: Secondary | ICD-10-CM | POA: Diagnosis not present

## 2015-01-09 DIAGNOSIS — N390 Urinary tract infection, site not specified: Secondary | ICD-10-CM | POA: Diagnosis present

## 2015-01-09 DIAGNOSIS — D649 Anemia, unspecified: Secondary | ICD-10-CM | POA: Diagnosis present

## 2015-01-09 DIAGNOSIS — I959 Hypotension, unspecified: Secondary | ICD-10-CM | POA: Diagnosis present

## 2015-01-09 DIAGNOSIS — N184 Chronic kidney disease, stage 4 (severe): Secondary | ICD-10-CM

## 2015-01-09 DIAGNOSIS — Z7951 Long term (current) use of inhaled steroids: Secondary | ICD-10-CM

## 2015-01-09 DIAGNOSIS — Z8672 Personal history of thrombophlebitis: Secondary | ICD-10-CM | POA: Diagnosis not present

## 2015-01-09 DIAGNOSIS — Z9981 Dependence on supplemental oxygen: Secondary | ICD-10-CM

## 2015-01-09 DIAGNOSIS — Z8673 Personal history of transient ischemic attack (TIA), and cerebral infarction without residual deficits: Secondary | ICD-10-CM

## 2015-01-09 DIAGNOSIS — I2510 Atherosclerotic heart disease of native coronary artery without angina pectoris: Secondary | ICD-10-CM | POA: Diagnosis present

## 2015-01-09 DIAGNOSIS — N186 End stage renal disease: Secondary | ICD-10-CM

## 2015-01-09 DIAGNOSIS — N2581 Secondary hyperparathyroidism of renal origin: Secondary | ICD-10-CM | POA: Diagnosis present

## 2015-01-09 DIAGNOSIS — Z794 Long term (current) use of insulin: Secondary | ICD-10-CM | POA: Diagnosis not present

## 2015-01-09 DIAGNOSIS — R110 Nausea: Secondary | ICD-10-CM

## 2015-01-09 DIAGNOSIS — I252 Old myocardial infarction: Secondary | ICD-10-CM | POA: Diagnosis not present

## 2015-01-09 DIAGNOSIS — G4700 Insomnia, unspecified: Secondary | ICD-10-CM | POA: Diagnosis present

## 2015-01-09 DIAGNOSIS — E871 Hypo-osmolality and hyponatremia: Secondary | ICD-10-CM | POA: Diagnosis present

## 2015-01-09 DIAGNOSIS — R140 Abdominal distension (gaseous): Secondary | ICD-10-CM

## 2015-01-09 DIAGNOSIS — Z87891 Personal history of nicotine dependence: Secondary | ICD-10-CM

## 2015-01-09 DIAGNOSIS — I509 Heart failure, unspecified: Secondary | ICD-10-CM

## 2015-01-09 DIAGNOSIS — I480 Paroxysmal atrial fibrillation: Secondary | ICD-10-CM | POA: Diagnosis not present

## 2015-01-09 DIAGNOSIS — K311 Adult hypertrophic pyloric stenosis: Secondary | ICD-10-CM

## 2015-01-09 DIAGNOSIS — R0600 Dyspnea, unspecified: Secondary | ICD-10-CM

## 2015-01-09 DIAGNOSIS — N19 Unspecified kidney failure: Secondary | ICD-10-CM | POA: Diagnosis present

## 2015-01-09 DIAGNOSIS — I272 Other secondary pulmonary hypertension: Secondary | ICD-10-CM | POA: Diagnosis present

## 2015-01-09 LAB — I-STAT TROPONIN, ED: Troponin i, poc: 0.05 ng/mL (ref 0.00–0.08)

## 2015-01-09 LAB — BASIC METABOLIC PANEL
Anion gap: 23 — ABNORMAL HIGH (ref 5–15)
BUN: 151 mg/dL — ABNORMAL HIGH (ref 6–23)
CO2: 11 mmol/L — ABNORMAL LOW (ref 19–32)
Calcium: 8.4 mg/dL (ref 8.4–10.5)
Chloride: 98 mmol/L (ref 96–112)
Creatinine, Ser: 11.66 mg/dL — ABNORMAL HIGH (ref 0.50–1.10)
GFR calc Af Amer: 3 mL/min — ABNORMAL LOW (ref >90)
GFR calc non Af Amer: 3 mL/min — ABNORMAL LOW (ref >90)
Glucose, Bld: 214 mg/dL — ABNORMAL HIGH (ref 70–99)
Potassium: 5.6 mmol/L — ABNORMAL HIGH (ref 3.5–5.1)
Sodium: 132 mmol/L — ABNORMAL LOW (ref 135–145)

## 2015-01-09 LAB — I-STAT CHEM 8, ED
BUN: 131 mg/dL — ABNORMAL HIGH (ref 6–23)
Calcium, Ion: 0.96 mmol/L — ABNORMAL LOW (ref 1.13–1.30)
Chloride: 101 mmol/L (ref 96–112)
Creatinine, Ser: 11.8 mg/dL — ABNORMAL HIGH (ref 0.50–1.10)
Glucose, Bld: 202 mg/dL — ABNORMAL HIGH (ref 70–99)
HCT: 35 % — ABNORMAL LOW (ref 36.0–46.0)
Hemoglobin: 11.9 g/dL — ABNORMAL LOW (ref 12.0–15.0)
POTASSIUM: 5.5 mmol/L — AB (ref 3.5–5.1)
SODIUM: 131 mmol/L — AB (ref 135–145)
TCO2: 10 mmol/L (ref 0–100)

## 2015-01-09 LAB — CBC
HCT: 31.1 % — ABNORMAL LOW (ref 36.0–46.0)
HEMATOCRIT: 30 % — AB (ref 36.0–46.0)
HEMOGLOBIN: 10.2 g/dL — AB (ref 12.0–15.0)
Hemoglobin: 10.1 g/dL — ABNORMAL LOW (ref 12.0–15.0)
MCH: 28.9 pg (ref 26.0–34.0)
MCH: 29.4 pg (ref 26.0–34.0)
MCHC: 32.8 g/dL (ref 30.0–36.0)
MCHC: 33.7 g/dL (ref 30.0–36.0)
MCV: 88.1 fL (ref 78.0–100.0)
MCV: 87.2 fL (ref 78.0–100.0)
PLATELETS: 200 10*3/uL (ref 150–400)
Platelets: 114 10*3/uL — ABNORMAL LOW (ref 150–400)
RBC: 3.53 MIL/uL — ABNORMAL LOW (ref 3.87–5.11)
RBC: 3.44 MIL/uL — ABNORMAL LOW (ref 3.87–5.11)
RDW: 18.8 % — AB (ref 11.5–15.5)
RDW: 18.8 % — ABNORMAL HIGH (ref 11.5–15.5)
WBC: 6.6 10*3/uL (ref 4.0–10.5)
WBC: 9 10*3/uL (ref 4.0–10.5)

## 2015-01-09 LAB — CREATININE, SERUM
CREATININE: 11.37 mg/dL — AB (ref 0.50–1.10)
GFR calc Af Amer: 3 mL/min — ABNORMAL LOW (ref >90)
GFR, EST NON AFRICAN AMERICAN: 3 mL/min — AB (ref >90)

## 2015-01-09 LAB — GLUCOSE, CAPILLARY: Glucose-Capillary: 134 mg/dL — ABNORMAL HIGH (ref 70–99)

## 2015-01-09 LAB — BRAIN NATRIURETIC PEPTIDE: B NATRIURETIC PEPTIDE 5: 2271.5 pg/mL — AB (ref 0.0–100.0)

## 2015-01-09 LAB — LACTIC ACID, PLASMA: LACTIC ACID, VENOUS: 1.5 mmol/L (ref 0.5–2.0)

## 2015-01-09 MED ORDER — HEPARIN SODIUM (PORCINE) 1000 UNIT/ML DIALYSIS
1000.0000 [IU] | INTRAMUSCULAR | Status: DC | PRN
  Filled 2015-01-09: qty 1

## 2015-01-09 MED ORDER — HEPARIN SODIUM (PORCINE) 5000 UNIT/ML IJ SOLN
5000.0000 [IU] | Freq: Three times a day (TID) | INTRAMUSCULAR | Status: DC
  Administered 2015-01-10 – 2015-01-12 (×7): 5000 [IU] via SUBCUTANEOUS
  Filled 2015-01-09 (×11): qty 1

## 2015-01-09 MED ORDER — FUROSEMIDE 10 MG/ML IJ SOLN
80.0000 mg | Freq: Once | INTRAMUSCULAR | Status: AC
  Administered 2015-01-09: 80 mg via INTRAVENOUS
  Filled 2015-01-09: qty 8

## 2015-01-09 MED ORDER — SODIUM CHLORIDE 0.9 % IV SOLN: 100.0000 mL | INTRAVENOUS | Status: DC | PRN

## 2015-01-09 MED ORDER — PENTAFLUOROPROP-TETRAFLUOROETH EX AERO: 1.0000 "application " | INHALATION_SPRAY | CUTANEOUS | Status: DC | PRN

## 2015-01-09 MED ORDER — NEPRO/CARBSTEADY PO LIQD
237.0000 mL | ORAL | Status: DC | PRN
  Filled 2015-01-09: qty 237

## 2015-01-09 MED ORDER — FUROSEMIDE 10 MG/ML IJ SOLN: 40.0000 mg | Freq: Once | INTRAMUSCULAR | Status: DC

## 2015-01-09 MED ORDER — SODIUM CHLORIDE 0.9 % IJ SOLN
3.0000 mL | Freq: Two times a day (BID) | INTRAMUSCULAR | Status: DC
  Administered 2015-01-09 – 2015-01-19 (×19): 3 mL via INTRAVENOUS

## 2015-01-09 MED ORDER — LIDOCAINE-PRILOCAINE 2.5-2.5 % EX CREA
1.0000 "application " | TOPICAL_CREAM | CUTANEOUS | Status: DC | PRN
  Filled 2015-01-09: qty 5

## 2015-01-09 MED ORDER — PANTOPRAZOLE SODIUM 40 MG IV SOLR
40.0000 mg | INTRAVENOUS | Status: DC
  Administered 2015-01-09: 40 mg via INTRAVENOUS
  Filled 2015-01-09 (×2): qty 40

## 2015-01-09 MED ORDER — HEPARIN SODIUM (PORCINE) 1000 UNIT/ML DIALYSIS
40.0000 [IU]/kg | INTRAMUSCULAR | Status: DC | PRN
  Filled 2015-01-09: qty 4

## 2015-01-09 MED ORDER — ATROPINE SULFATE 0.1 MG/ML IJ SOLN
0.5000 mg | Freq: Once | INTRAMUSCULAR | Status: AC
  Administered 2015-01-09: 0.5 mg via INTRAVENOUS
  Filled 2015-01-09: qty 10

## 2015-01-09 MED ORDER — ALTEPLASE 2 MG IJ SOLR
2.0000 mg | Freq: Once | INTRAMUSCULAR | Status: AC | PRN
  Filled 2015-01-09: qty 2

## 2015-01-09 MED ORDER — INSULIN ASPART 100 UNIT/ML ~~LOC~~ SOLN
0.0000 [IU] | SUBCUTANEOUS | Status: DC
  Administered 2015-01-10: 1 [IU] via SUBCUTANEOUS

## 2015-01-09 MED ORDER — ARFORMOTEROL TARTRATE 15 MCG/2ML IN NEBU
15.0000 ug | INHALATION_SOLUTION | Freq: Two times a day (BID) | RESPIRATORY_TRACT | Status: DC
  Administered 2015-01-10 – 2015-01-25 (×25): 15 ug via RESPIRATORY_TRACT
  Filled 2015-01-09 (×36): qty 2

## 2015-01-09 MED ORDER — LIDOCAINE HCL (PF) 1 % IJ SOLN: 5.0000 mL | INTRAMUSCULAR | Status: DC | PRN

## 2015-01-09 MED ORDER — SODIUM CHLORIDE 0.9 % IV SOLN: 250.0000 mL | INTRAVENOUS | Status: DC | PRN

## 2015-01-09 MED ORDER — ASPIRIN 325 MG PO TABS
325.0000 mg | ORAL_TABLET | Freq: Every day | ORAL | Status: DC
  Administered 2015-01-10 – 2015-01-13 (×4): 325 mg via ORAL
  Filled 2015-01-09 (×5): qty 1

## 2015-01-09 MED ORDER — BUDESONIDE 0.5 MG/2ML IN SUSP
0.5000 mg | Freq: Two times a day (BID) | RESPIRATORY_TRACT | Status: DC
  Administered 2015-01-11 – 2015-01-25 (×23): 0.5 mg via RESPIRATORY_TRACT
  Filled 2015-01-09 (×16): qty 2
  Filled 2015-01-09: qty 4
  Filled 2015-01-09 (×2): qty 2
  Filled 2015-01-09: qty 4
  Filled 2015-01-09 (×16): qty 2

## 2015-01-09 MED ORDER — SODIUM CHLORIDE 0.9 % IJ SOLN: 3.0000 mL | INTRAMUSCULAR | Status: DC | PRN

## 2015-01-09 MED ORDER — CETYLPYRIDINIUM CHLORIDE 0.05 % MT LIQD
7.0000 mL | Freq: Two times a day (BID) | OROMUCOSAL | Status: DC
  Administered 2015-01-09: 7 mL via OROMUCOSAL

## 2015-01-09 MED ORDER — IPRATROPIUM-ALBUTEROL 0.5-2.5 (3) MG/3ML IN SOLN
3.0000 mL | Freq: Four times a day (QID) | RESPIRATORY_TRACT | Status: DC
  Administered 2015-01-10 (×2): 3 mL via RESPIRATORY_TRACT
  Filled 2015-01-09 (×3): qty 3

## 2015-01-09 NOTE — ED Provider Notes (Signed)
CSN: [id number]     Arrival date & time 01/09/15  1537 History   First MD Initiated Contact with Patient 01/09/15 1553     Chief Complaint  Patient presents with  . Shortness of Breath     (Consider location/radiation/quality/duration/timing/severity/associated sxs/prior Treatment) HPI Comments: Patient with a history of hypertension and CHF with chronic kidney disease presents with shortness of breath. She states she was admitted in November for CHF. She states she's been having ongoing shortness of breath since that time and is just been progressively getting worse. She is on oxygen at home at 2 L/m chronically. She states she has shortness of breath on exertion, with minimal exertion. She states her weight has stayed about 195 which is her baseline. She doesn't have any significant orthopnea. She does have some increased leg swelling. She denies any significant cough.  She denies any fevers or chills. She denies any chest discomfort. She does also have a history of COPD and uses Spiriva at home.   Past Medical History  Diagnosis Date  . Hyperlipidemia   . Hyperparathyroidism   . CAD (coronary artery disease)   . Myocardial infarction 2008  . Hypertension   . COPD (chronic obstructive pulmonary disease)   . Diabetes mellitus     diagnosed 2010 - takes oral meds and lantus insulin  . Anemia   . Chronic kidney disease     esrd - since 2010  . GERD (gastroesophageal reflux disease)     on omeprazole  . Arthritis     all over  . CHF (congestive heart failure) 08-2011  . CVA 02/19/2010  . CAD 02/19/2010  . DEEP VENOUS THROMBOPHLEBITIS, LEG, LEFT 03/08/2010    in 2009 s/p hospitalization- only 1 isolated dvt  . Stroke ?2010    found remote CVA on CT scan  . Pulmonary artery hypertension 10/31/2014  . COPD exacerbation 10/31/2014   Past Surgical History  Procedure Laterality Date  . Av fistula placement  04/05/11    Left Radiocephalic AVF  . Appendectomy  1969  . Tubal ligation     . Revision of fisturl    . Coronary angioplasty with stent placement  06/23/2007    by Dr. Irish Lack is her cardiologist  . Ligation goretex fistula  10/29/2011    Procedure: LIGATION GORE-TEX FISTULA;  Surgeon: Elam Dutch, MD;  Location: [facility] OR;  Service: Vascular;  Laterality: Left;  Ligation of competing branches left forearm fistula  . Fistulogram N/A 02/17/2012    Procedure: FISTULOGRAM;  Surgeon: Angelia Mould, MD;  Location: [facility] CATH LAB;  Service: Cardiovascular;  Laterality: N/A;   Family History  Problem Relation Age of Onset  . Cancer Mother     multiple myloma  . Dementia Father   . Pneumonia Father   . Anuerysm Father    History  Substance Use Topics  . Smoking status: Former Smoker -- 0.50 packs/day for 53 years    Types: Cigarettes    Start date: 12/09/1960    Quit date: 03/31/2014  . Smokeless tobacco: Never Used  . Alcohol Use: No   OB History    No data available     Review of Systems  Constitutional: Positive for fatigue. Negative for fever, chills and diaphoresis.  HENT: Negative for congestion, rhinorrhea and sneezing.   Eyes: Negative.   Respiratory: Positive for shortness of breath. Negative for chest tightness.   Cardiovascular: Positive for leg swelling. Negative for chest pain.  Gastrointestinal: Negative for nausea, vomiting, abdominal  pain, diarrhea and blood in stool.  Genitourinary: Negative for frequency, hematuria, flank pain and difficulty urinating.  Musculoskeletal: Negative for back pain and arthralgias.  Skin: Negative for rash.  Neurological: Negative for dizziness, speech difficulty, weakness, numbness and headaches.      Allergies  Losartan and Varenicline  Home Medications   Prior to Admission medications   Medication Sig Start Date End Date Taking? Authorizing Provider  acetaminophen (TYLENOL) 500 MG tablet Take 1,000 mg by mouth every 6 (six) hours as needed.   Yes Historical Provider, MD  acetaminophen-codeine  (TYLENOL #3) 300-30 MG per tablet 1 tablet every 6 (six) hours as needed for moderate pain.  01/31/14  Yes Historical Provider, MD  albuterol (PROVENTIL HFA) 108 (90 BASE) MCG/ACT inhaler Inhale 2 puffs into the lungs every 4 (four) hours as needed. For wheezing 02/16/14  Yes Angelica Ran, MD  allopurinol (ZYLOPRIM) 100 MG tablet TAKE 2 TABLETS BY MOUTH EVERY DAY Patient taking differently: TAKE 3 TABLETS BY MOUTH EVERY DAY 12/12/14  Yes Elberta Leatherwood, MD  amLODipine (NORVASC) 5 MG tablet Take 1 tablet (5 mg total) by mouth daily. 09/16/14  Yes Elberta Leatherwood, MD  aspirin 325 MG tablet Take 325 mg by mouth at bedtime.    Yes Historical Provider, MD  [employer] ALLERGY RELIEF 180 MG tablet TAKE 1 TABLET BY MOUTH EVERY DAY Patient taking differently: TAKE 1 TABLET BY MOUTH as needed for allergies 02/14/13  Yes Angelica Ran, MD  furosemide (LASIX) 80 MG tablet Take 1 tablet (80 mg total) by mouth daily. Patient taking differently: Take 80-160 mg by mouth 2 (two) times daily. Takes 160mg  in am and 80 or 160mg depending in pm 11/02/14  Yes Crystal Libby Maw, MD  glipiZIDE (GLUCOTROL XL) 5 MG 24 hr tablet Take 1 tablet (5 mg total) by mouth daily with breakfast. Patient taking differently: Take 5 mg by mouth every evening.  09/16/14  Yes Elberta Leatherwood, MD  glucosamine-chondroitin 500-400 MG tablet Take 1 tablet by mouth 2 (two) times daily.     Yes Historical Provider, MD  HYDROcodone-acetaminophen (NORCO/VICODIN) 5-325 MG per tablet Take 1 tablet by mouth every 12 (twelve) hours as needed for moderate pain. 09/16/14  Yes Elberta Leatherwood, MD  insulin glargine (LANTUS) 100 UNIT/ML injection Inject 0.13 mLs (13 Units total) into the skin every morning. If blood sugar is >140 add 1 unit. If blood sugar is <140 subtract 1 unit. 03/18/14  Yes Angelica Ran, MD  metoprolol succinate (TOPROL-XL) 25 MG 24 hr tablet Take 0.5 tablets (12.5 mg total) by mouth daily. 09/16/14  Yes Elberta Leatherwood, MD  mometasone-formoterol  (DULERA) 100-5 MCG/ACT AERO Inhale 2 puffs into the lungs 2 (two) times daily. Patient taking differently: Inhale 2 puffs into the lungs as needed for shortness of breath.  11/02/14  Yes Archie Patten, MD  nitroGLYCERIN (NITROSTAT) 0.4 MG SL tablet Place 0.4 mg under the tongue every 5 (five) minutes as needed. For chest pain     Call 911 if pain not relieved by 3rd tab   Yes Historical Provider, MD  NON FORMULARY Place 2 L into the nose continuous. 2 liters of o2   Yes Historical Provider, MD  Omega-3 Fatty Acids (FISH OIL) 1200 MG CAPS Take 2 capsules by mouth 2 (two) times daily.   Yes Historical Provider, MD  omeprazole (PRILOSEC) 20 MG capsule TAKE ONE CAPSULE BY MOUTH AT BEDTIME 04/27/14  Yes Angelica Ran, MD  oxyCODONE-acetaminophen (  PERCOCET/ROXICET) 5-325 MG per tablet Take 1 tablet by mouth 3 (three) times daily as needed for severe pain.   Yes Historical Provider, MD  Polyethyl Glycol-Propyl Glycol (SYSTANE OP) Apply 1-2 drops to eye 4 (four) times daily as needed. For dry eyes   Yes Historical Provider, MD  pravastatin (PRAVACHOL) 40 MG tablet Take 1 tablet (40 mg total) by mouth daily. Patient taking differently: Take 40 mg by mouth every evening.  09/16/14  Yes Elberta Leatherwood, MD  Tiotropium Bromide Monohydrate (SPIRIVA RESPIMAT) 2.5 MCG/ACT AERS Inhale 2 puffs once daily Patient taking differently: Inhale 2 puffs into the lungs as needed. Inhale 2 puffs once daily 03/24/14  Yes Zigmund Gottron, MD  vitamin B-12 (CYANOCOBALAMIN) 1000 MCG tablet Take 1,000 mcg by mouth every morning.    Yes Historical Provider, MD  benzonatate (TESSALON) 100 MG capsule Take 1 capsule (100 mg total) by mouth 3 (three) times daily as needed for cough. 11/02/14   Archie Patten, MD  doxycycline (VIBRA-TABS) 100 MG tablet Take 1 tablet (100 mg total) by mouth every 12 (twelve) hours. Starting tonight 11/02/14   Archie Patten, MD  glucose blood (ACCU-CHEK SMARTVIEW) test strip 1 each by Other  route 2 (two) times daily. Use as instructed 12/19/14   Timmothy Euler, MD  Lancets (ACCU-CHEK SOFT TOUCH) lancets 1 each by Other route 2 (two) times daily. Use as instructed 12/19/14   Timmothy Euler, MD  predniSONE (DELTASONE) 50 MG tablet Take 1 tablet (50 mg total) by mouth daily with breakfast. 11/02/14   Archie Patten, MD   BP 108/35 mmHg  Pulse 45  Temp(Src) 97.8 F (36.6 C) (Oral)  Resp 17  Ht 5' (1.524 m)  Wt 195 lb (88.451 kg)  BMI 38.08 kg/m2  SpO2 92% Physical Exam  Constitutional: She is oriented to person, place, and time. She appears well-developed and well-nourished.  HENT:  Head: Normocephalic and atraumatic.  Eyes: Pupils are equal, round, and reactive to light.  Neck: Normal range of motion. Neck supple.  Cardiovascular: Normal rate, regular rhythm and normal heart sounds.   Pulmonary/Chest: Effort normal. No respiratory distress. She has no wheezes. She has rales. She exhibits no tenderness.  Few crackles in the bases  Abdominal: Soft. Bowel sounds are normal. There is no tenderness. There is no rebound and no guarding.  Musculoskeletal: Normal range of motion. She exhibits edema (positive 3+ pitting edema bilaterally).  Lymphadenopathy:    She has no cervical adenopathy.  Neurological: She is alert and oriented to person, place, and time.  Skin: Skin is warm and dry. No rash noted.  Psychiatric: She has a normal mood and affect.    ED Course  Procedures (including critical care time) Labs Review Labs Reviewed  BASIC METABOLIC PANEL - Abnormal; Notable for the following:    Sodium 132 (*)    Potassium 5.6 (*)    CO2 11 (*)    Glucose, Bld 214 (*)    BUN 151 (*)    Creatinine, Ser 11.66 (*)    GFR calc non Af Amer 3 (*)    GFR calc Af Amer 3 (*)    Anion gap 23 (*)    All other components within normal limits  CBC - Abnormal; Notable for the following:    RBC 3.53 (*)    Hemoglobin 10.2 (*)    HCT 31.1 (*)    RDW 18.8 (*)    All other  components within normal limits  I-STAT CHEM 8, ED - Abnormal; Notable for the following:    Sodium 131 (*)    Potassium 5.5 (*)    BUN 131 (*)    Creatinine, Ser 11.80 (*)    Glucose, Bld 202 (*)    Calcium, Ion 0.96 (*)    Hemoglobin 11.9 (*)    HCT 35.0 (*)    All other components within normal limits  BRAIN NATRIURETIC PEPTIDE  I-STAT TROPOININ, ED    Imaging Review Dg Chest Portable 1 View  01/09/2015   CLINICAL DATA:  Shortness of breath and wheezing for 2 days.  EXAM: PORTABLE CHEST - 1 VIEW  COMPARISON:  PA and lateral chest 10/30/2014 and 10/30/2012.  FINDINGS: There is marked cardiomegaly and vascular congestion. No consolidative process, pneumothorax or effusion is seen.  IMPRESSION: Cardiomegaly and pulmonary vascular congestion.   Electronically Signed   By: Inge Rise M.D.   On: 01/09/2015 16:26     EKG Interpretation   Date/Time:  Monday January 09 2015 15:47:06 EST Ventricular Rate:  68 PR Interval:    QRS Duration: 115 QT Interval:  525 QTC Calculation: 558 R Axis:   100 Text Interpretation:  regular bradycardic rhythm, likely sinus, but no  definite P waves seen Paired ventricular premature complexes Nonspecific  intraventricular conduction delay Low voltage, precordial leads  Nonspecific repol abnormality, lateral leads Confirmed by Charmain Diosdado  MD,  Quenten Nawaz ([id number]) on 01/09/2015 4:17:19 PM      MDM   Final diagnoses:  Acute renal failure, unspecified acute renal failure type  Acute on chronic congestive heart failure, unspecified congestive heart failure type  Hyperkalemia  Symptomatic bradycardia    Patient presents with shortness of breath. She has a marked increase in her creatinine. Her baseline creatinine is between 2 and 3. Her creatinine today is 11 with a slight increase in her potassium at 5.5. She has bradycardia likely from the metoprolol that she's taking. She also has evidence of fluid overload. She's been hypotensive in the ED with a  blood pressure in the 90s. She was given atropine 0.5 mg as well as Lasix. Her heart rate is increased to the 50s after the atropine. Her sats have remained in the low 90s. I consulted Dr. Posey Pronto with nephrology who will see the patient.  Dr. Titus Mould to see pt.  Also spoke with cardiology who will consult.  CRITICAL CARE Performed by: Braelynn Lupton Total critical care time: 60 Critical care time was exclusive of separately billable procedures and treating other patients. Critical care was necessary to treat or prevent imminent or life-threatening deterioration. Critical care was time spent personally by me on the following activities: development of treatment plan with patient and/or surrogate as well as nursing, discussions with consultants, evaluation of patient's response to treatment, examination of patient, obtaining history from patient or surrogate, ordering and performing treatments and interventions, ordering and review of laboratory studies, ordering and review of radiographic studies, pulse oximetry and re-evaluation of patient's condition.   Malvin Johns, MD 01/09/15 [phone]

## 2015-01-09 NOTE — H&P (Signed)
PULMONARY / CRITICAL CARE MEDICINE   Name: Kristi [name] MRN: [id number] DOB: Dec 20, 1940    ADMISSION DATE:  01/09/2015 CONSULTATION DATE:  01/09/2015  REFERRING MD :  EDP  CHIEF COMPLAINT:  Weakness, SOB  INITIAL PRESENTATION:  74 y.o. F with hx chronic renal insufficiency (never on dialysis) brought to [facility] ED 2/1 for SOB and generalized weakness.  In ED, found to have significantly elevated creatinine, hyperkalemia, AG metabolic acidosis.  PCCM was called for admission.   STUDIES:  CXR 2/1 >>> vascular congestion, cardiomegaly. Renal US 2/1 >>>  No evidence of hydro, small amount of ascites about liver and spleen  SIGNIFICANT EVENTS: 2/1 - admitted, urgent dialysis   HISTORY OF PRESENT ILLNESS:  Kristi [name] is a 74 y.o. F with PMH as outlined below.  She presented to [facility] ED 2/1 with generalized weakness and SOB.  She has hx of chronic renal insufficiency (stage IV CKD) but has never required dialysis.  She does however have a left wrist AV fistula which has never been accessed before.  She states she has been feeling weak for quite some time now, for the past few weeks but has never seeked medical attention.  On day of presentation, she felt much worse than before and due to worsening SOB over past 3 days, decided to come to ED for further evaluation. In ED, she was found to have elevated SCr (11.8 with baseline around 2.5 - 3), hyperkalemia (5.6), AG metabolic acidosis, volume overload, and pulmonary congestion on CXR.  She received atropine x 1 for symptomatic bradycardia.  Also received 80mg  IV lasix but did not have any UOP.  Nephrology was consulted and felt that pt would require urgent dialysis tonight. PCCM was called for admission.   PAST MEDICAL HISTORY :   has a past medical history of Hyperlipidemia; Hyperparathyroidism; CAD (coronary artery disease); Myocardial infarction (2008); Hypertension; COPD (chronic obstructive pulmonary disease); Diabetes mellitus; Anemia; Chronic  kidney disease; GERD (gastroesophageal reflux disease); Arthritis; CHF (congestive heart failure) (08-2011); CVA (02/19/2010); CAD (02/19/2010); DEEP VENOUS THROMBOPHLEBITIS, LEG, LEFT (03/08/2010); Stroke (?2010); Pulmonary artery hypertension (10/31/2014); and COPD exacerbation (10/31/2014).  has past surgical history that includes AV fistula placement (04/05/11); Appendectomy (1969); Tubal ligation; revision of fisturl; Coronary angioplasty with stent (06/23/2007); Ligation goretex fistula (10/29/2011); and Fistulogram (N/A, 02/17/2012). Prior to Admission medications   Medication Sig Start Date End Date Taking? Authorizing Provider  acetaminophen (TYLENOL) 500 MG tablet Take 1,000 mg by mouth every 6 (six) hours as needed.   Yes Historical Provider, MD  acetaminophen-codeine (TYLENOL #3) 300-30 MG per tablet 1 tablet every 6 (six) hours as needed for moderate pain.  01/31/14  Yes Historical Provider, MD  albuterol (PROVENTIL HFA) 108 (90 BASE) MCG/ACT inhaler Inhale 2 puffs into the lungs every 4 (four) hours as needed. For wheezing 02/16/14  Yes Angelica Ran, MD  allopurinol (ZYLOPRIM) 100 MG tablet TAKE 2 TABLETS BY MOUTH EVERY DAY Patient taking differently: TAKE 3 TABLETS BY MOUTH EVERY DAY 12/12/14  Yes Elberta Leatherwood, MD  amLODipine (NORVASC) 5 MG tablet Take 1 tablet (5 mg total) by mouth daily. 09/16/14  Yes Elberta Leatherwood, MD  aspirin 325 MG tablet Take 325 mg by mouth at bedtime.    Yes Historical Provider, MD  [employer] ALLERGY RELIEF 180 MG tablet TAKE 1 TABLET BY MOUTH EVERY DAY Patient taking differently: TAKE 1 TABLET BY MOUTH as needed for allergies 02/14/13  Yes Angelica Ran, MD  furosemide (LASIX) 80 MG  tablet Take 1 tablet (80 mg total) by mouth daily. Patient taking differently: Take 80-160 mg by mouth 2 (two) times daily. Takes 160mg  in am and 80 or 160mg depending in pm 11/02/14  Yes Crystal Libby Maw, MD  glipiZIDE (GLUCOTROL XL) 5 MG 24 hr tablet Take 1 tablet (5 mg total) by mouth  daily with breakfast. Patient taking differently: Take 5 mg by mouth every evening.  09/16/14  Yes Elberta Leatherwood, MD  glucosamine-chondroitin 500-400 MG tablet Take 1 tablet by mouth 2 (two) times daily.     Yes Historical Provider, MD  HYDROcodone-acetaminophen (NORCO/VICODIN) 5-325 MG per tablet Take 1 tablet by mouth every 12 (twelve) hours as needed for moderate pain. 09/16/14  Yes Elberta Leatherwood, MD  insulin glargine (LANTUS) 100 UNIT/ML injection Inject 0.13 mLs (13 Units total) into the skin every morning. If blood sugar is >140 add 1 unit. If blood sugar is <140 subtract 1 unit. 03/18/14  Yes Angelica Ran, MD  metoprolol succinate (TOPROL-XL) 25 MG 24 hr tablet Take 0.5 tablets (12.5 mg total) by mouth daily. 09/16/14  Yes Elberta Leatherwood, MD  mometasone-formoterol (DULERA) 100-5 MCG/ACT AERO Inhale 2 puffs into the lungs 2 (two) times daily. Patient taking differently: Inhale 2 puffs into the lungs as needed for shortness of breath.  11/02/14  Yes Archie Patten, MD  nitroGLYCERIN (NITROSTAT) 0.4 MG SL tablet Place 0.4 mg under the tongue every 5 (five) minutes as needed. For chest pain     Call 911 if pain not relieved by 3rd tab   Yes Historical Provider, MD  NON FORMULARY Place 2 L into the nose continuous. 2 liters of o2   Yes Historical Provider, MD  Omega-3 Fatty Acids (FISH OIL) 1200 MG CAPS Take 2 capsules by mouth 2 (two) times daily.   Yes Historical Provider, MD  omeprazole (PRILOSEC) 20 MG capsule TAKE ONE CAPSULE BY MOUTH AT BEDTIME 04/27/14  Yes Angelica Ran, MD  oxyCODONE-acetaminophen (PERCOCET/ROXICET) 5-325 MG per tablet Take 1 tablet by mouth 3 (three) times daily as needed for severe pain.   Yes Historical Provider, MD  Polyethyl Glycol-Propyl Glycol (SYSTANE OP) Apply 1-2 drops to eye 4 (four) times daily as needed. For dry eyes   Yes Historical Provider, MD  pravastatin (PRAVACHOL) 40 MG tablet Take 1 tablet (40 mg total) by mouth daily. Patient taking differently:  Take 40 mg by mouth every evening.  09/16/14  Yes Elberta Leatherwood, MD  Tiotropium Bromide Monohydrate (SPIRIVA RESPIMAT) 2.5 MCG/ACT AERS Inhale 2 puffs once daily Patient taking differently: Inhale 2 puffs into the lungs as needed. Inhale 2 puffs once daily 03/24/14  Yes Zigmund Gottron, MD  vitamin B-12 (CYANOCOBALAMIN) 1000 MCG tablet Take 1,000 mcg by mouth every morning.    Yes Historical Provider, MD  benzonatate (TESSALON) 100 MG capsule Take 1 capsule (100 mg total) by mouth 3 (three) times daily as needed for cough. 11/02/14   Archie Patten, MD  doxycycline (VIBRA-TABS) 100 MG tablet Take 1 tablet (100 mg total) by mouth every 12 (twelve) hours. Starting tonight 11/02/14   Archie Patten, MD  glucose blood (ACCU-CHEK SMARTVIEW) test strip 1 each by Other route 2 (two) times daily. Use as instructed 12/19/14   Timmothy Euler, MD  Lancets (ACCU-CHEK SOFT TOUCH) lancets 1 each by Other route 2 (two) times daily. Use as instructed 12/19/14   Timmothy Euler, MD  predniSONE (DELTASONE) 50 MG tablet Take 1 tablet (50  mg total) by mouth daily with breakfast. 11/02/14   Archie Patten, MD   Allergies  Allergen Reactions  . Losartan Other (See Comments)    Causes increase in creatinine and worsening CKD  . Varenicline Nausea Only and Other (See Comments)    Dizzy and GI symptoms     FAMILY HISTORY:  Family History  Problem Relation Age of Onset  . Cancer Mother     multiple myloma  . Dementia Father   . Pneumonia Father   . Anuerysm Father     SOCIAL HISTORY:  reports that she quit smoking about 9 months ago. Her smoking use included Cigarettes. She started smoking about 54 years ago. She has a 26.5 pack-year smoking history. She has never used smokeless tobacco. She reports that she does not drink alcohol or use illicit drugs.  REVIEW OF SYSTEMS:   All negative; except for those that are bolded, which indicate positives.  Constitutional: weight loss, weight gain, night  sweats, fevers, chills, fatigue, weakness.  HEENT: headaches, sore throat, sneezing, nasal congestion, post nasal drip, difficulty swallowing, tooth/dental problems, visual complaints, visual changes, ear aches. Neuro: difficulty with speech, weakness, numbness, ataxia. CV:  chest pain, orthopnea, PND, swelling in lower extremities, dizziness, palpitations, syncope.  Resp: cough, hemoptysis, dyspnea, wheezing. GI  heartburn, indigestion, abdominal pain, nausea, vomiting, diarrhea, constipation, change in bowel habits, loss of appetite, hematemesis, melena, hematochezia.  GU: dysuria, change in color of urine, urgency or frequency, flank pain, hematuria. MSK: joint pain or swelling, decreased range of motion. Psych: change in mood or affect, depression, anxiety, suicidal ideations, homicidal ideations. Skin: rash, itching, bruising.   SUBJECTIVE:  " I just feel weak all over".  VITAL SIGNS: Temp:  [97.7 F (36.5 C)-98.1 F (36.7 C)] 98.1 F (36.7 C) (02/01 1753) Pulse Rate:  [43-46] 46 (02/01 1800) Resp:  [11-23] 18 (02/01 1800) BP: (97-130)/(35-46) 113/40 mmHg (02/01 1800) SpO2:  [91 %-96 %] 96 % (02/01 1800) Weight:  [88.451 kg (195 lb)] 88.451 kg (195 lb) (02/01 1541) HEMODYNAMICS:   VENTILATOR SETTINGS:   INTAKE / OUTPUT: Intake/Output      02/01 0701 - 02/02 0700   Urine (mL/kg/hr) 75   Total Output 75   Net -75         PHYSICAL EXAMINATION: General: Obese female, resting in bed, in NAD. Neuro: A&O x 3, non-focal.  HEENT: [place]/AT. PERRL, sclerae anicteric. Cardiovascular: Sinus brady, no M/R/G.  Lungs: Respirations even and unlabored.  BS distant, faint crackles. Abdomen: BS x 4, soft, somewhat tender to palpation. Musculoskeletal: No gross deformities, 2+ pitting edema to knees bilaterally. Skin: Intact, warm, no rashes.  LABS:  CBC  Recent Labs Lab 01/09/15 1601 01/09/15 1611  WBC 6.6  --   HGB 10.2* 11.9*  HCT 31.1* 35.0*  PLT 200  --    Coag's No  results for input(s): APTT, INR in the last 168 hours. BMET  Recent Labs Lab 01/09/15 1601 01/09/15 1611  NA 132* 131*  K 5.6* 5.5*  CL 98 101  CO2 11*  --   BUN 151* 131*  CREATININE 11.66* 11.80*  GLUCOSE 214* 202*   Electrolytes  Recent Labs Lab 01/09/15 1601  CALCIUM 8.4   Sepsis Markers No results for input(s): LATICACIDVEN, PROCALCITON, O2SATVEN in the last 168 hours. ABG No results for input(s): PHART, PCO2ART, PO2ART in the last 168 hours. Liver Enzymes No results for input(s): AST, ALT, ALKPHOS, BILITOT, ALBUMIN in the last 168 hours. Cardiac Enzymes No results  for input(s): TROPONINI, PROBNP in the last 168 hours. Glucose No results for input(s): GLUCAP in the last 168 hours.  Imaging US Renal  01/09/2015   CLINICAL DATA:  Acute onset of renal failure.  Initial encounter.  EXAM: RENAL/URINARY TRACT ULTRASOUND COMPLETE  COMPARISON:  None.  FINDINGS: Right Kidney:  Length: 9.5 cm. Mildly increased parenchymal echogenicity. No mass or hydronephrosis visualized.  Left Kidney:  Length: 9.1 cm. Mildly increased parenchymal echogenicity. No mass or hydronephrosis visualized.  Bladder:  Appears normal for degree of bladder distention.  A small amount of ascites is noted about the liver and spleen.  IMPRESSION: 1. No evidence of hydronephrosis. 2. Mildly increased renal parenchymal echogenicity may reflect medical renal disease. 3. Small amount of ascites noted about the liver and spleen.   Electronically Signed   By: Garald Balding M.D.   On: 01/09/2015 18:57   Dg Chest Portable 1 View  01/09/2015   CLINICAL DATA:  Shortness of breath and wheezing for 2 days.  EXAM: PORTABLE CHEST - 1 VIEW  COMPARISON:  PA and lateral chest 10/30/2014 and 10/30/2012.  FINDINGS: There is marked cardiomegaly and vascular congestion. No consolidative process, pneumothorax or effusion is seen.  IMPRESSION: Cardiomegaly and pulmonary vascular congestion.   Electronically Signed   By: Inge Rise  M.D.   On: 01/09/2015 16:26     ASSESSMENT / PLAN:  RENAL A:   Acute on chronic kidney disease - progression from baseline, ? ESRD Hyperkalemia - in the setting of renal failure AG metabolic acidosis - in the setting of uremia / renal failure Hyponatremia - in setting of renal failure Volume overload - concern for progression of CKD to ESRD.  Minimal UOP after 80mg  Lasix P:   Nephrology following and planning on dialysis tonight if fistula can be accessed. If fistula not useable, will require HD catheter. Strict I/O's. Check lactate. BMP in AM.  CARDIOVASCULAR A:  Hx diastolic heart failure - Echo from 10/29/14: EF 60 - 65%, grade 3 DD, mod LVH, PAP 96. Elevated BNP Bradycardia - likely due to combination of metabolic acidosis with hyperkalemia and accumulation of outpatient metoprolol Intermittent hypotension - resolved Hx HLD, HTN, CAD, MI P:  Dialysis for fluid removal. Echo. If remains bradycardic with soft BP, would use Dopamine. Hold outpatient Amlodipine, Lasix, Toprol XL, Nitro, Pravastatin.  PULMONARY A: Acute hypoxic respiratory failure Hx COPD without evidence of exacerbation - no PFT's accessible in system PAH as suggested by echo from 10/29/14 - PAP 96 P:   Continue supplemental O2 as needed to maintain SpO2 > 92%. DuoNebs, Brovana, Budesonide. Hold outpatient Spiriva Respimat, Dulera. Consider outpatient follow up for further eval of PAH.  GASTROINTESTINAL A:   GERD Mild ascites seen on renal US Nutrition P:   Pantoprazole. NPO except meds for now.  HEMATOLOGIC A:   Chronic anemia VTE Prophylaxis P:  Transfuse per usual ICU guidelines. SCD's / Heparin. CBC in AM.  INFECTIOUS A:   No indication of infection at this point P:   UCx 2/1 >>> Follow UA. Monitor clinically.  ENDOCRINE A:   DM R/o AI - has prednisone 50mg  daily listed on outpatient meds P:   CBG's q4hr. SSI. Hold outpatient Glipizide, Lantus, Check  cortisol.  NEUROLOGIC A:   Hx CVA 2011 Pain P:   Continue daily ASA. Hold outpatient Oxycodone.   Family updated: None  Interdisciplinary Family Meeting v Palliative Care Meeting:  Due by: 2/8.   Montey Hora, PA - [facility] Pgr: [phone]  or [phone] 01/09/2015, 7:23 PM

## 2015-01-09 NOTE — ED Notes (Signed)
Renee from dialysis called ED for reports, report provided and pt will be transported once she returns from ultrasound

## 2015-01-09 NOTE — Consult Note (Signed)
CARDIOLOGY CONSULT NOTE      Patient ID: Kristi [name] MRN: [id number] DOB/AGE: 05-28-1941 74 y.o.  Admit date: 01/09/2015 Referring PhysicianMelanie Tamera Punt, MD Primary PhysicianMcKeag, Marylynn Pearson, MD Primary Cardiologist [facility] Reason for Consultation bradycardia  HPI:  74 -year-old woman who has a history of coronary artery disease and prior inferior MI. She has had chronic renal insufficiency for several years. She presents today with feeling poorly in general.  She was found to have a significantly elevated creatinine compared to baseline. She did receive some IV diuretics in the emergency room without much response. Ultrasound of her bladder showed only a small amount of urine. It was felt that she would require dialysis tonight.  She has had some bradycardia with hypotension intermittently in the emergency room. Heart rates have been in the 40s. There is one ECG which appears to be a junctional escape rhythm.  She did receive some atropine. Her blood pressure initially was low but it has increased of late. Currently, she has no lightheadedness. Her heart rate is in the 40s. Last blood pressure was 779 systolic. She has not felt like she was going to pass out.  Review of systems complete and found to be negative unless listed above   Past Medical History  Diagnosis Date  . Hyperlipidemia   . Hyperparathyroidism   . CAD (coronary artery disease)   . Myocardial infarction 2008  . Hypertension   . COPD (chronic obstructive pulmonary disease)   . Diabetes mellitus     diagnosed 2010 - takes oral meds and lantus insulin  . Anemia   . Chronic kidney disease     esrd - since 2010  . GERD (gastroesophageal reflux disease)     on omeprazole  . Arthritis     all over  . CHF (congestive heart failure) 08-2011  . CVA 02/19/2010  . CAD 02/19/2010  . DEEP VENOUS THROMBOPHLEBITIS, LEG, LEFT 03/08/2010    in 2009 s/p hospitalization- only 1 isolated dvt  . Stroke ?2010    found remote CVA on  CT scan  . Pulmonary artery hypertension 10/31/2014  . COPD exacerbation 10/31/2014    Family History  Problem Relation Age of Onset  . Cancer Mother     multiple myloma  . Dementia Father   . Pneumonia Father   . Anuerysm Father     History   Social History  . Marital Status: Single    Spouse Name: N/A    Number of Children: 3  . Years of Education: N/A   Occupational History  . RetiredCatering manager Work    Social History Main Topics  . Smoking status: Former Smoker -- 0.50 packs/day for 53 years    Types: Cigarettes    Start date: 12/09/1960    Quit date: 03/31/2014  . Smokeless tobacco: Never Used  . Alcohol Use: No  . Drug Use: No  . Sexual Activity: Not on file   Other Topics Concern  . Not on file   Social History Narrative   Health Care POA: [place]   Emergency Contact: Suzan Slick    End of Life Plan:    Who lives with you: Lives by her self in 1 story home   Any pets:    Diet: Patient typically eats frozen meal/sandwiches.  Has problems standing to cook.   Exercise: Patient does not have a regular exercise plan.   Seatbelts: Patient reports wearing seat belt when in vehicle.   Nancy Fetter Exposure/Protection:  Patient does not wear sun protection.   Hobbies: Crafts, knitting, crotcheting          Past Surgical History  Procedure Laterality Date  . Av fistula placement  04/05/11    Left Radiocephalic AVF  . Appendectomy  1969  . Tubal ligation    . Revision of fisturl    . Coronary angioplasty with stent placement  06/23/2007    by Dr. Irish Lack is her cardiologist  . Ligation goretex fistula  10/29/2011    Procedure: LIGATION GORE-TEX FISTULA;  Surgeon: Elam Dutch, MD;  Location: [facility] OR;  Service: Vascular;  Laterality: Left;  Ligation of competing branches left forearm fistula  . Fistulogram N/A 02/17/2012    Procedure: FISTULOGRAM;  Surgeon: Angelia Mould, MD;  Location: [facility] CATH LAB;  Service: Cardiovascular;   Laterality: N/A;      (Not in a hospital admission)  Physical Exam: Vitals:   Filed Vitals:   01/09/15 1700 01/09/15 1730 01/09/15 1753 01/09/15 1800  BP: 108/35 115/45 130/39 113/40  Pulse: 45 46  46  Temp:   98.1 F (36.7 C)   TempSrc:   Oral   Resp: 17 11 23 18   Height:      Weight:      SpO2: 92% 91% 92% 96%   I&O's:  No intake or output data in the 24 hours ending 01/09/15 1856 Physical exam: Appears frail [place]/AT EOMI No JVD, No carotid bruit RRR S1S2  No wheezing Obese 3+ lower extremity pitting edema. No focal motor or sensory deficits Flat affect  Labs:   Lab Results  Component Value Date   WBC 6.6 01/09/2015   HGB 11.9* 01/09/2015   HCT 35.0* 01/09/2015   MCV 88.1 01/09/2015   PLT 200 01/09/2015    Recent Labs Lab 01/09/15 1601 01/09/15 1611  NA 132* 131*  K 5.6* 5.5*  CL 98 101  CO2 11*  --   BUN 151* 131*  CREATININE 11.66* 11.80*  CALCIUM 8.4  --   GLUCOSE 214* 202*   Lab Results  Component Value Date   CKTOTAL 140 01/19/2010   CKMB 2.6 01/19/2010   TROPONINI <0.30 10/29/2014    Lab Results  Component Value Date   CHOL 157 09/30/2014   CHOL 179 01/18/2011   CHOL  01/17/2010    130        ATP III CLASSIFICATION:  <200     mg/dL   Desirable  200-239  mg/dL   Borderline High  >=240    mg/dL   High          Lab Results  Component Value Date   HDL 32* 09/30/2014   HDL 39 04/05/2013   HDL 34* 01/18/2011   Lab Results  Component Value Date   LDLCALC 92 09/30/2014   LDLCALC 98 04/05/2013   LDLCALC 117* 01/18/2011   Lab Results  Component Value Date   TRIG 166* 09/30/2014   TRIG 39 04/05/2013   TRIG 140 01/18/2011   Lab Results  Component Value Date   CHOLHDL 4.9 09/30/2014   CHOLHDL 5.3 01/18/2011   CHOLHDL 4.1 01/17/2010   No results found for: LDLDIRECT    Radiology: Renal Ultrasound pending; in discussing with the ultrasound technician, there was a small amount of ascites EKG: Junctional escape  rhythm  ASSESSMENT AND PLAN:  Acute renal failure, bradycardia, CAD  1) I discussed the case with Dr. Posey Pronto. The patient will be emergently dialyzed tonight. This will actually remove the metoprolol  that in her system. This should help her heart rate and blood pressure. If she cannot be dialyzed due to failure of the AV fistula, could use dopamine IV if she has symptomatic bradycardia. In speaking to Dr. Posey Pronto, he mentioned that if central venous access is needed, placement of a tri-Alysis catheter would be ideal as that would help facilitate dialysis in the morning, but only if her AV fistula is not functional.  2) Edema: She is not responding to diuretics. She will need dialysis for fluid removal.  3) CAD/ diastolic heart failure: She is not reporting angina at this time. She has had diastolic heart failure in the past. Fluid management with dialysis at this point.   4) Hypotension: Intermittent. Use dopamine as noted above.   would not plan on transvenous pacemaker at this time as the cause of her bradycardia may resolve with dialysis.   Signed:   Mina Marble, MD, [facility] 01/09/2015, 6:56 PM

## 2015-01-09 NOTE — ED Notes (Signed)
Pt brought in by EMS for [facility], bradycardia and edema that has gotten progressively worse over the past several weeks.  Pt is in end stage renal failure and has a fistula in the left wrist but pt has not been dialyzed.

## 2015-01-09 NOTE — Consult Note (Signed)
Reason for Consult: Acute renal failure on chronic kidney disease stage IV Referring Physician: Dr.Belfi (ER Physician)   HPI:  74 year old Caucasian woman with past medical history significant for chronic kidney disease stage IV (baseline creatinine between 2.7-3.0) that is presumed to be from underlying diabetes and hypertension and exacerbated by use of ARB/acute on chronic heart failure. She is status post left radiocephalic fistula placement in 2012 with ligation of accessory vessels and a normal fistulogram in 2013 (fistula development/appropriateness question). She also has a history of grade 3 diastolic heart failure and pulmonary hypertension.   Presents with about 3 days of progressively worsening shortness of breath, pedal edema and "weakness all over". In the emergency room found to have pulmonary congestion/cardiomegaly with a creatinine of 11.8, hyperkalemia (5.6) and anion gap metabolic acidosis. She is status post 80 mg intravenous furosemide without any urine output so far-she felt the urge to void but could not urinate. A bladder scan reveals only about 46 mL in the emergency room.   Past Medical History  Diagnosis Date  . Hyperlipidemia   . Hyperparathyroidism   . CAD (coronary artery disease)   . Myocardial infarction 2008  . Hypertension   . COPD (chronic obstructive pulmonary disease)   . Diabetes mellitus     diagnosed 2010 - takes oral meds and lantus insulin  . Anemia   . Chronic kidney disease     esrd - since 2010  . GERD (gastroesophageal reflux disease)     on omeprazole  . Arthritis     all over  . CHF (congestive heart failure) 08-2011  . CVA 02/19/2010  . CAD 02/19/2010  . DEEP VENOUS THROMBOPHLEBITIS, LEG, LEFT 03/08/2010    in 2009 s/p hospitalization- only 1 isolated dvt  . Stroke ?2010    found remote CVA on CT scan  . Pulmonary artery hypertension 10/31/2014  . COPD exacerbation 10/31/2014    Past Surgical History  Procedure Laterality Date  .  Av fistula placement  04/05/11    Left Radiocephalic AVF  . Appendectomy  1969  . Tubal ligation    . Revision of fisturl    . Coronary angioplasty with stent placement  06/23/2007    by Dr. Irish Lack is her cardiologist  . Ligation goretex fistula  10/29/2011    Procedure: LIGATION GORE-TEX FISTULA;  Surgeon: Elam Dutch, MD;  Location: [facility] OR;  Service: Vascular;  Laterality: Left;  Ligation of competing branches left forearm fistula  . Fistulogram N/A 02/17/2012    Procedure: FISTULOGRAM;  Surgeon: Angelia Mould, MD;  Location: [facility] CATH LAB;  Service: Cardiovascular;  Laterality: N/A;    Family History  Problem Relation Age of Onset  . Cancer Mother     multiple myloma  . Dementia Father   . Pneumonia Father   . Anuerysm Father     Social History:  reports that she quit smoking about 9 months ago. Her smoking use included Cigarettes. She started smoking about 54 years ago. She has a 26.5 pack-year smoking history. She has never used smokeless tobacco. She reports that she does not drink alcohol or use illicit drugs.  Allergies:  Allergies  Allergen Reactions  . Losartan Other (See Comments)    Causes increase in creatinine and worsening CKD  . Varenicline Nausea Only and Other (See Comments)    Dizzy and GI symptoms     Medications: Prior to Admission:  (Not in a hospital admission)  Results for orders placed or performed during the  hospital encounter of 01/09/15 (from the past 48 hour(s))  Basic metabolic panel    (if pt has PMH of COPD)     Status: Abnormal   Collection Time: 01/09/15  4:01 PM  Result Value Ref Range   Sodium 132 (L) 135 - 145 mmol/L   Potassium 5.6 (H) 3.5 - 5.1 mmol/L   Chloride 98 96 - 112 mmol/L   CO2 11 (L) 19 - 32 mmol/L   Glucose, Bld 214 (H) 70 - 99 mg/dL   BUN 151 (H) 6 - 23 mg/dL   Creatinine, Ser 11.66 (H) 0.50 - 1.10 mg/dL   Calcium 8.4 8.4 - 10.5 mg/dL   GFR calc non Af Amer 3 (L) >90 mL/min   GFR calc Af Amer 3 (L) >90  mL/min    Comment: (NOTE) The eGFR has been calculated using the CKD EPI equation. This calculation has not been validated in all clinical situations. eGFR's persistently <90 mL/min signify possible Chronic Kidney Disease.    Anion gap 23 (H) 5 - 15  CBC     (if pt has PMH of COPD)     Status: Abnormal   Collection Time: 01/09/15  4:01 PM  Result Value Ref Range   WBC 6.6 4.0 - 10.5 K/uL   RBC 3.53 (L) 3.87 - 5.11 MIL/uL   Hemoglobin 10.2 (L) 12.0 - 15.0 g/dL   HCT 31.1 (L) 36.0 - 46.0 %   MCV 88.1 78.0 - 100.0 fL   MCH 28.9 26.0 - 34.0 pg   MCHC 32.8 30.0 - 36.0 g/dL   RDW 18.8 (H) 11.5 - 15.5 %   Platelets 200 150 - 400 K/uL  Brain natriuretic peptide     Status: Abnormal   Collection Time: 01/09/15  4:04 PM  Result Value Ref Range   B Natriuretic Peptide 2271.5 (H) 0.0 - 100.0 pg/mL  I-stat troponin, ED (if patient has history of COPD)     Status: None   Collection Time: 01/09/15  4:10 PM  Result Value Ref Range   Troponin i, poc 0.05 0.00 - 0.08 ng/mL   Comment 3            Comment: Due to the release kinetics of cTnI, a negative result within the first hours of the onset of symptoms does not rule out myocardial infarction with certainty. If myocardial infarction is still suspected, repeat the test at appropriate intervals.   I-stat Chem 8, ED     Status: Abnormal   Collection Time: 01/09/15  4:11 PM  Result Value Ref Range   Sodium 131 (L) 135 - 145 mmol/L   Potassium 5.5 (H) 3.5 - 5.1 mmol/L   Chloride 101 96 - 112 mmol/L   BUN 131 (H) 6 - 23 mg/dL   Creatinine, Ser 11.80 (H) 0.50 - 1.10 mg/dL   Glucose, Bld 202 (H) 70 - 99 mg/dL   Calcium, Ion 0.96 (L) 1.13 - 1.30 mmol/L   TCO2 10 0 - 100 mmol/L   Hemoglobin 11.9 (L) 12.0 - 15.0 g/dL   HCT 35.0 (L) 36.0 - 46.0 %    Dg Chest Portable 1 View  01/09/2015   CLINICAL DATA:  Shortness of breath and wheezing for 2 days.  EXAM: PORTABLE CHEST - 1 VIEW  COMPARISON:  PA and lateral chest 10/30/2014 and 10/30/2012.   FINDINGS: There is marked cardiomegaly and vascular congestion. No consolidative process, pneumothorax or effusion is seen.  IMPRESSION: Cardiomegaly and pulmonary vascular congestion.   Electronically Signed  By: Inge Rise M.D.   On: 01/09/2015 16:26    Review of Systems  Constitutional: Positive for malaise/fatigue. Negative for fever and chills.  HENT: Negative.   Respiratory: Positive for shortness of breath.   Cardiovascular: Positive for orthopnea and leg swelling.  Gastrointestinal: Positive for abdominal pain, diarrhea and constipation.       Recent constipation leading up to diarrhea-frequent scant stools  Genitourinary:       She has the urge to urinate but cannot void urine  Musculoskeletal: Positive for myalgias.  Skin: Negative.   Neurological: Positive for weakness.       Feels generally weak  Psychiatric/Behavioral: The patient is nervous/anxious.    Blood pressure 130/39, pulse 46, temperature 98.1 F (36.7 C), temperature source Oral, resp. rate 23, height 5' (1.524 m), weight 88.451 kg (195 lb), SpO2 92 %. Physical Exam  Nursing note and vitals reviewed. Constitutional: She is oriented to person, place, and time. She appears well-developed. No distress.  Obese woman in some discomfort  HENT:  Head: Normocephalic and atraumatic.  Nose: Nose normal.  Mouth/Throat: No oropharyngeal exudate.  Eyes: Conjunctivae and EOM are normal. Pupils are equal, round, and reactive to light. No scleral icterus.  Neck: Normal range of motion. Neck supple. JVD present. No thyromegaly present.  Cardiovascular: Exam reveals no friction rub.   No murmur heard. Regular bradycardia  Respiratory: Effort normal. She has rales.  Fine bibasal rales without any obvious rhonchi  GI: Soft. Bowel sounds are normal. She exhibits no distension. There is tenderness. There is no rebound.  Epigastric tenderness  Musculoskeletal: She exhibits edema.  2+ lower extremity edema  Neurological:  She is alert and oriented to person, place, and time.  She is alert but falls easily back to sleep. No obvious tremor/rigors  Skin: Skin is warm and dry. No rash noted. No erythema.  Psychiatric: She has a normal mood and affect.    Assessment/Plan: 1. Acute renal failure on chronic kidney disease stage IV. 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 It is unclear as to what exactly caused her decompensation and may indeed be from CHF decompensation-I have requested a renal ultrasound as well as urinalysis to investigate for further causes. Given the constellation of metabolic abnormalities as well as volume overload, I suspect that she'll be diuretic refractory and will need hemodialysis tonight if possible via her left radiocephalic fistula or tomorrow morning via a dialysis catheter if the fistula is not usable (I think it will sustain to 17-gauge needles with low blood flows and may have a fistulogram plus/minus intervention done tomorrow). I do not disagree with a trial of diuretics this time. 2. Anion gap Metabolic acidosis: Secondary to acute renal failure-anticipated to improve with hemodialysis. 3. Hyperkalemia: Likely from acute renal failure as well, monitor with hemodialysis/diuretic therapy. 4. CHF decompensation: Volume overloaded on physical exam, attempt ultrafiltration and hemodialysis. 5. Anemia: We'll check iron studies and determine need for iron/ESA.  Kassius Battiste K. 01/09/2015, 6:06 PM

## 2015-01-09 NOTE — ED Notes (Signed)
Belfi MD aware of pt BP and HR and confirms administration of Lasix.

## 2015-01-09 NOTE — ED Notes (Signed)
Bladder scan performed with a total of 50mL in bladder.  Patel MD made aware.

## 2015-01-09 NOTE — Progress Notes (Signed)
RT attempted to obtain ABG, pt was in ultrasound at this time. Will attempt once pt in room.

## 2015-01-10 DIAGNOSIS — I509 Heart failure, unspecified: Secondary | ICD-10-CM | POA: Insufficient documentation

## 2015-01-10 DIAGNOSIS — R001 Bradycardia, unspecified: Secondary | ICD-10-CM | POA: Insufficient documentation

## 2015-01-10 DIAGNOSIS — E875 Hyperkalemia: Secondary | ICD-10-CM

## 2015-01-10 DIAGNOSIS — N179 Acute kidney failure, unspecified: Secondary | ICD-10-CM | POA: Insufficient documentation

## 2015-01-10 LAB — GLUCOSE, CAPILLARY
Glucose-Capillary: 94 mg/dL (ref 70–99)
Glucose-Capillary: 79 mg/dL (ref 70–99)
Glucose-Capillary: 119 mg/dL — ABNORMAL HIGH (ref 70–99)
Glucose-Capillary: 169 mg/dL — ABNORMAL HIGH (ref 70–99)

## 2015-01-10 LAB — BASIC METABOLIC PANEL
ANION GAP: 20 — AB (ref 5–15)
BUN: 102 mg/dL — ABNORMAL HIGH (ref 6–23)
CO2: 17 mmol/L — ABNORMAL LOW (ref 19–32)
Calcium: 8.3 mg/dL — ABNORMAL LOW (ref 8.4–10.5)
Chloride: 99 mmol/L (ref 96–112)
Creatinine, Ser: 9.17 mg/dL — ABNORMAL HIGH (ref 0.50–1.10)
GFR calc Af Amer: 4 mL/min — ABNORMAL LOW (ref >90)
GFR calc non Af Amer: 4 mL/min — ABNORMAL LOW (ref >90)
GLUCOSE: 94 mg/dL (ref 70–99)
POTASSIUM: 4.2 mmol/L (ref 3.5–5.1)
Sodium: 136 mmol/L (ref 135–145)

## 2015-01-10 LAB — URINALYSIS, ROUTINE W REFLEX MICROSCOPIC
Bilirubin Urine: NEGATIVE
Glucose, UA: NEGATIVE mg/dL
Ketones, ur: NEGATIVE mg/dL
Nitrite: NEGATIVE
PH: 7 (ref 5.0–8.0)
Protein, ur: 100 mg/dL — AB
Specific Gravity, Urine: 1.025 (ref 1.005–1.030)
Urobilinogen, UA: 1 mg/dL (ref 0.0–1.0)

## 2015-01-10 LAB — IRON AND TIBC
IRON: 27 ug/dL — AB (ref 42–145)
SATURATION RATIOS: 8 % — AB (ref 20–55)
TIBC: 322 ug/dL (ref 250–470)
UIBC: 295 ug/dL (ref 125–400)

## 2015-01-10 LAB — CBC
HCT: 27.1 % — ABNORMAL LOW (ref 36.0–46.0)
HEMOGLOBIN: 9.3 g/dL — AB (ref 12.0–15.0)
MCH: 29.3 pg (ref 26.0–34.0)
MCHC: 34.3 g/dL (ref 30.0–36.0)
MCV: 85.5 fL (ref 78.0–100.0)
PLATELETS: 93 10*3/uL — AB (ref 150–400)
RBC: 3.17 MIL/uL — ABNORMAL LOW (ref 3.87–5.11)
RDW: 18.5 % — ABNORMAL HIGH (ref 11.5–15.5)
WBC: 6.3 10*3/uL (ref 4.0–10.5)

## 2015-01-10 LAB — CORTISOL: Cortisol, Plasma: 35.3 ug/dL

## 2015-01-10 LAB — BRAIN NATRIURETIC PEPTIDE: B NATRIURETIC PEPTIDE 5: 2273.8 pg/mL — AB (ref 0.0–100.0)

## 2015-01-10 LAB — HEPATITIS B SURFACE ANTIGEN: HEP B S AG: NEGATIVE

## 2015-01-10 LAB — MAGNESIUM: Magnesium: 2.5 mg/dL (ref 1.5–2.5)

## 2015-01-10 LAB — URINE MICROSCOPIC-ADD ON

## 2015-01-10 LAB — PHOSPHORUS: PHOSPHORUS: 9.6 mg/dL — AB (ref 2.3–4.6)

## 2015-01-10 LAB — FERRITIN: FERRITIN: 37 ng/mL (ref 10–291)

## 2015-01-10 LAB — MRSA PCR SCREENING: MRSA by PCR: NEGATIVE

## 2015-01-10 MED ORDER — INSULIN ASPART 100 UNIT/ML ~~LOC~~ SOLN
0.0000 [IU] | Freq: Three times a day (TID) | SUBCUTANEOUS | Status: DC
  Administered 2015-01-11: 5 [IU] via SUBCUTANEOUS
  Administered 2015-01-11: 3 [IU] via SUBCUTANEOUS
  Administered 2015-01-11: 2 [IU] via SUBCUTANEOUS
  Administered 2015-01-12: 3 [IU] via SUBCUTANEOUS
  Administered 2015-01-14 – 2015-01-21 (×8): 2 [IU] via SUBCUTANEOUS
  Administered 2015-01-21: 3 [IU] via SUBCUTANEOUS
  Administered 2015-01-21 – 2015-01-22 (×2): 2 [IU] via SUBCUTANEOUS
  Administered 2015-01-24: 3 [IU] via SUBCUTANEOUS
  Administered 2015-01-25 (×2): 2 [IU] via SUBCUTANEOUS

## 2015-01-10 MED ORDER — IPRATROPIUM-ALBUTEROL 0.5-2.5 (3) MG/3ML IN SOLN
3.0000 mL | Freq: Four times a day (QID) | RESPIRATORY_TRACT | Status: DC
  Administered 2015-01-10 – 2015-01-21 (×33): 3 mL via RESPIRATORY_TRACT
  Filled 2015-01-10 (×37): qty 3

## 2015-01-10 MED ORDER — PANTOPRAZOLE SODIUM 40 MG PO TBEC
40.0000 mg | DELAYED_RELEASE_TABLET | Freq: Every day | ORAL | Status: DC
  Administered 2015-01-10 – 2015-01-15 (×5): 40 mg via ORAL
  Filled 2015-01-10 (×5): qty 1

## 2015-01-10 MED ORDER — RENA-VITE PO TABS
1.0000 | ORAL_TABLET | Freq: Every day | ORAL | Status: DC
  Administered 2015-01-10 – 2015-01-24 (×13): 1 via ORAL
  Filled 2015-01-10 (×18): qty 1

## 2015-01-10 MED ORDER — INSULIN ASPART 100 UNIT/ML ~~LOC~~ SOLN: 0.0000 [IU] | Freq: Every day | SUBCUTANEOUS | Status: DC

## 2015-01-10 MED ORDER — FLUCONAZOLE 150 MG PO TABS
150.0000 mg | ORAL_TABLET | Freq: Once | ORAL | Status: AC
  Administered 2015-01-10: 150 mg via ORAL
  Filled 2015-01-10: qty 1

## 2015-01-10 MED ORDER — ZOLPIDEM TARTRATE 5 MG PO TABS
5.0000 mg | ORAL_TABLET | Freq: Once | ORAL | Status: AC
  Administered 2015-01-10: 5 mg via ORAL
  Filled 2015-01-10: qty 1

## 2015-01-10 NOTE — Progress Notes (Signed)
Patient ID: Kristi [name], female   DOB: [birth year], 74 y.o.   MRN: [id number]    Subjective:  No chest pain   Objective:  Filed Vitals:   01/10/15 0900 01/10/15 0930 01/10/15 1000 01/10/15 1030  BP: 100/47 103/44 101/69 105/47  Pulse: 67 70 73 71  Temp:      TempSrc:      Resp: 20 20 24 13   Height:      Weight:      SpO2: 96% 96% 96% 94%    Intake/Output from previous day:  Intake/Output Summary (Last 24 hours) at 01/10/15 1050 Last data filed at 01/10/15 0600  Gross per 24 hour  Intake      3 ml  Output   2136 ml  Net  -2133 ml    Physical Exam: Affect appropriate Obese female  HEENT: normal Neck supple with no adenopathy JVP normal no bruits no thyromegaly Lungs clear with no wheezing and good diaphragmatic motion Heart:  S1/S2 no murmur, no rub, gallop or click PMI normal Abdomen: benighn, BS positve, no tenderness, no AAA no bruit.  No HSM or HJR Distal pulses intact with no bruits No edema Neuro non-focal Skin warm and dry No muscular weakness   Lab Results: Basic Metabolic Panel:  Recent Labs  01/09/15 1601 01/09/15 1611 01/09/15 1917 01/10/15 0440  NA 132* 131*  --  136  K 5.6* 5.5*  --  4.2  CL 98 101  --  99  CO2 11*  --   --  17*  GLUCOSE 214* 202*  --  94  BUN 151* 131*  --  102*  CREATININE 11.66* 11.80* 11.37* 9.17*  CALCIUM 8.4  --   --  8.3*  MG  --   --   --  2.5  PHOS  --   --   --  9.6*   Liver Function Tests: No results for input(s): AST, ALT, ALKPHOS, BILITOT, PROT, ALBUMIN in the last 72 hours. No results for input(s): LIPASE, AMYLASE in the last 72 hours. CBC:  Recent Labs  01/09/15 2310 01/10/15 0440  WBC 9.0 6.3  HGB 10.1* 9.3*  HCT 30.0* 27.1*  MCV 87.2 85.5  PLT 114* 93*    Imaging: US Renal  01/09/2015   CLINICAL DATA:  Acute onset of renal failure.  Initial encounter.  EXAM: RENAL/URINARY TRACT ULTRASOUND COMPLETE  COMPARISON:  None.  FINDINGS: Right Kidney:  Length: 9.5 cm. Mildly increased parenchymal  echogenicity. No mass or hydronephrosis visualized.  Left Kidney:  Length: 9.1 cm. Mildly increased parenchymal echogenicity. No mass or hydronephrosis visualized.  Bladder:  Appears normal for degree of bladder distention.  A small amount of ascites is noted about the liver and spleen.  IMPRESSION: 1. No evidence of hydronephrosis. 2. Mildly increased renal parenchymal echogenicity may reflect medical renal disease. 3. Small amount of ascites noted about the liver and spleen.   Electronically Signed   By: Garald Balding M.D.   On: 01/09/2015 18:57   Dg Chest Portable 1 View  01/09/2015   CLINICAL DATA:  Shortness of breath and wheezing for 2 days.  EXAM: PORTABLE CHEST - 1 VIEW  COMPARISON:  PA and lateral chest 10/30/2014 and 10/30/2012.  FINDINGS: There is marked cardiomegaly and vascular congestion. No consolidative process, pneumothorax or effusion is seen.  IMPRESSION: Cardiomegaly and pulmonary vascular congestion.   Electronically Signed   By: Inge Rise M.D.   On: 01/09/2015 16:26    Cardiac Studies:  ECG:  SB rate 68  Low voltage no acute ST changes    Telemetry:  SB no long pauses or AV block  Echo:   2015  EF 60-65%   Medications:   . antiseptic oral rinse  7 mL Mouth Rinse BID  . arformoterol  15 mcg Nebulization BID  . aspirin  325 mg Oral Daily  . budesonide  0.5 mg Nebulization BID  . heparin  5,000 Units Subcutaneous 3 times per day  . insulin aspart  0-9 Units Subcutaneous 6 times per day  . ipratropium-albuterol  3 mL Nebulization Q6H  . multivitamin  1 tablet Oral QHS  . pantoprazole (PROTONIX) IV  40 mg Intravenous Q24H  . sodium chloride  3 mL Intravenous Q12H       Assessment/Plan:  Bradycardia:  Related to RF, acidosis , elevated K and accumulation of lopressor  Improved no indication for pacer ARF:  Dialysis as indicated   Outpatient f/u Dr Irish Lack regarding pulmonary hypertension my benefit from vasodilator Rx  Jenkins Rouge 01/10/2015, 10:50  AM

## 2015-01-10 NOTE — Progress Notes (Signed)
UR Completed.  [phone]  

## 2015-01-10 NOTE — Progress Notes (Signed)
S: Breathing a little better.  Groggy as she received ambien at [facility] O:BP 95/47 mmHg  Pulse 59  Temp(Src) 98.2 F (36.8 C) (Oral)  Resp 16  Ht 5' (1.524 m)  Wt 89.2 kg (196 lb 10.4 oz)  BMI 38.41 kg/m2  SpO2 95%  Intake/Output Summary (Last 24 hours) at 01/10/15 0738 Last data filed at 01/10/15 0600  Gross per 24 hour  Intake      3 ml  Output   2136 ml  Net  -2133 ml   Weight change:  GXQ:JJHER and alert but groggy CVS:RRR Resp:Faint basilar crackles Abd:+ BS NTND Ext: 2+ edema NEURO: Ox3 + asterixis   . antiseptic oral rinse  7 mL Mouth Rinse BID  . arformoterol  15 mcg Nebulization BID  . aspirin  325 mg Oral Daily  . budesonide  0.5 mg Nebulization BID  . heparin  5,000 Units Subcutaneous 3 times per day  . insulin aspart  0-9 Units Subcutaneous 6 times per day  . ipratropium-albuterol  3 mL Nebulization Q6H  . pantoprazole (PROTONIX) IV  40 mg Intravenous Q24H  . sodium chloride  3 mL Intravenous Q12H   US Renal  01/09/2015   CLINICAL DATA:  Acute onset of renal failure.  Initial encounter.  EXAM: RENAL/URINARY TRACT ULTRASOUND COMPLETE  COMPARISON:  None.  FINDINGS: Right Kidney:  Length: 9.5 cm. Mildly increased parenchymal echogenicity. No mass or hydronephrosis visualized.  Left Kidney:  Length: 9.1 cm. Mildly increased parenchymal echogenicity. No mass or hydronephrosis visualized.  Bladder:  Appears normal for degree of bladder distention.  A small amount of ascites is noted about the liver and spleen.  IMPRESSION: 1. No evidence of hydronephrosis. 2. Mildly increased renal parenchymal echogenicity may reflect medical renal disease. 3. Small amount of ascites noted about the liver and spleen.   Electronically Signed   By: Garald Balding M.D.   On: 01/09/2015 18:57   Dg Chest Portable 1 View  01/09/2015   CLINICAL DATA:  Shortness of breath and wheezing for 2 days.  EXAM: PORTABLE CHEST - 1 VIEW  COMPARISON:  PA and lateral chest 10/30/2014 and 10/30/2012.  FINDINGS:  There is marked cardiomegaly and vascular congestion. No consolidative process, pneumothorax or effusion is seen.  IMPRESSION: Cardiomegaly and pulmonary vascular congestion.   Electronically Signed   By: Inge Rise M.D.   On: 01/09/2015 16:26   BMET    Component Value Date/Time   NA 136 01/10/2015 0440   NA 141 08/25/2012 1033   K 4.2 01/10/2015 0440   K 4.3 08/25/2012 1033   CL 99 01/10/2015 0440   CL 108* 08/25/2012 1033   CO2 17* 01/10/2015 0440   CO2 19* 08/25/2012 1033   GLUCOSE 94 01/10/2015 0440   GLUCOSE 77 08/25/2012 1033   BUN 102* 01/10/2015 0440   BUN 68.0* 08/25/2012 1033   CREATININE 9.17* 01/10/2015 0440   CREATININE 2.61* 11/09/2014 0908   CREATININE 2.8* 05/10/2014   CREATININE 2.9* 08/25/2012 1033   CALCIUM 8.3* 01/10/2015 0440   CALCIUM 9.5 08/25/2012 1033   CALCIUM 9.9 03/29/2010 0000   GFRNONAA 4* 01/10/2015 0440   GFRNONAA 24* 08/23/2011 1059   GFRAA 4* 01/10/2015 0440   GFRAA 30* 08/23/2011 1059   CBC    Component Value Date/Time   WBC 6.3 01/10/2015 0440   WBC 11.0* 08/25/2012 1033   RBC 3.17* 01/10/2015 0440   RBC 4.38 08/25/2012 1033   HGB 9.3* 01/10/2015 0440   HGB 12.0 08/25/2012 1033  HCT 27.1* 01/10/2015 0440   HCT 36.9 08/25/2012 1033   PLT 93* 01/10/2015 0440   PLT 154 08/25/2012 1033   MCV 85.5 01/10/2015 0440   MCV 84.2 08/25/2012 1033   MCH 29.3 01/10/2015 0440   MCH 27.4 08/25/2012 1033   MCHC 34.3 01/10/2015 0440   MCHC 32.5 08/25/2012 1033   RDW 18.5* 01/10/2015 0440   RDW 16.5* 08/25/2012 1033   LYMPHSABS 1.4 11/09/2014 0908   LYMPHSABS 1.8 08/25/2012 1033   MONOABS 0.4 11/09/2014 0908   MONOABS 0.9 08/25/2012 1033   EOSABS 0.3 11/09/2014 0908   EOSABS 0.3 08/25/2012 1033   BASOSABS 0.0 11/09/2014 0908   BASOSABS 0.1 08/25/2012 1033     Assessment: 1. 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 Pulm edema 3. Hyperkalemia, improved 4 Pulm HTN 5. Anemia  Plan: 1. HD today 2. Start renavite 3. Check iron studies 4. Check  PTH  Barnabas Henriques T

## 2015-01-10 NOTE — Progress Notes (Signed)
eLink Physician-Brief Progress Note Patient Name: Kristi [name] DOB: Nov 22, 1941 MRN: [id number]   Date of Service  01/10/2015  HPI/Events of Note  Insomnia  eICU Interventions  Ambien 5 mg po times one dose     Intervention Category Minor Interventions: Routine modifications to care plan (e.g. PRN medications for pain, fever)  Kristi [name] 01/10/2015, 3:31 AM

## 2015-01-10 NOTE — Progress Notes (Signed)
74yo female admitted w/ acute on chronic kidney dz w/ possible progression to ESRD requiring urgent HD, also possibly w/ vaginal candidiasis, to begin antifungal.  Will give fluconazole 150mg  PO x1 and f/u in am whether more aggressive systemic tx is warranted.  Wynona Neat, PharmD, BCPS 01/10/2015 1:35 AM

## 2015-01-10 NOTE — Progress Notes (Signed)
PULMONARY / CRITICAL CARE MEDICINE   Name: Kristi [name] MRN: [id number] DOB: [birth year]    ADMISSION DATE:  01/09/2015 CONSULTATION DATE:  01/10/2015  REFERRING MD :  EDP  CHIEF COMPLAINT:  Weakness, SOB  Hospital Course:  74 y.o. F with hx grade 3 diastolic heart failure, pulmonary hypertension and chronic renal insufficiency (never on dialysis; radiocephalic fistula placement in 2012 with ligation of accessory vessels and a normal fistulogram in 2013) brought to [facility] ED 2/1 for SOB and generalized weakness.  In ED, found to have significantly elevated creatinine 11.8, hyperkalemia (5.6) and anion gap metabolic acidosis - Urgent dialysis 2/1 after no UOP w/ IV diuretics.  STUDIES:  CXR 2/1 >>> vascular congestion, cardiomegaly. Renal US 2/1 >>>  No evidence of hydro, small amount of ascites about liver and spleen ECHO >>> ordered  SIGNIFICANT EVENTS: 2/1 - admitted, urgent dialysis 2/2 - Dialysis; Acidosis improving  SUBJECTIVE:   Feels a bit better post-HD  VITAL SIGNS: Temp:  [96.6 F (35.9 C)-98.2 F (36.8 C)] 98.2 F (36.8 C) (02/02 0813) Pulse Rate:  [43-77] 63 (02/02 0807) Resp:  [11-24] 19 (02/02 0807) BP: (84-130)/(35-58) 95/47 mmHg (02/02 0700) SpO2:  [90 %-97 %] 96 % (02/02 0807) Weight:  [192 lb 14.4 oz (87.5 kg)-196 lb 10.4 oz (89.2 kg)] 196 lb 10.4 oz (89.2 kg) (02/02 0500) HEMODYNAMICS:   VENTILATOR SETTINGS:   INTAKE / OUTPUT: Intake/Output      02/01 0701 - 02/02 0700 02/02 0701 - 02/03 0700   I.V. (mL/kg) 3 (0)    Total Intake(mL/kg) 3 (0)    Urine (mL/kg/hr) 212    Other 1924    Total Output 2136     Net -2133            PHYSICAL EXAMINATION:  General: Obese female, resting in bed, in NAD. Neuro: A&O x 3, non-focal.  HEENT: [place]/AT. PERRL, sclerae anicteric. Cardiovascular: Sinus brady, no M/R/G.  Lungs: Respirations even and unlabored.  BS distant, faint crackles. Abdomen: BS x 4, soft, somewhat tender to palpation. Musculoskeletal: No gross  deformities, 2+ pitting edema to knees bilaterally. Skin: Intact, warm, no rashes.  LABS:  CBC  Recent Labs Lab 01/09/15 1601 01/09/15 1611 01/09/15 2310 01/10/15 0440  WBC 6.6  --  9.0 6.3  HGB 10.2* 11.9* 10.1* 9.3*  HCT 31.1* 35.0* 30.0* 27.1*  PLT 200  --  114* 93*   Coag's No results for input(s): APTT, INR in the last 168 hours. BMET  Recent Labs Lab 01/09/15 1601 01/09/15 1611 01/09/15 1917 01/10/15 0440  NA 132* 131*  --  136  K 5.6* 5.5*  --  4.2  CL 98 101  --  99  CO2 11*  --   --  17*  BUN 151* 131*  --  102*  CREATININE 11.66* 11.80* 11.37* 9.17*  GLUCOSE 214* 202*  --  94   Electrolytes  Recent Labs Lab 01/09/15 1601 01/10/15 0440  CALCIUM 8.4 8.3*  MG  --  2.5  PHOS  --  9.6*   Sepsis Markers  Recent Labs Lab 01/09/15 2310  LATICACIDVEN 1.5   ABG No results for input(s): PHART, PCO2ART, PO2ART in the last 168 hours. Liver Enzymes No results for input(s): AST, ALT, ALKPHOS, BILITOT, ALBUMIN in the last 168 hours. Cardiac Enzymes No results for input(s): TROPONINI, PROBNP in the last 168 hours. Glucose  Recent Labs Lab 01/09/15 2252 01/10/15 0417 01/10/15 0811  GLUCAP 134* 94 79    Imaging US  Renal  01/09/2015   CLINICAL DATA:  Acute onset of renal failure.  Initial encounter.  EXAM: RENAL/URINARY TRACT ULTRASOUND COMPLETE  COMPARISON:  None.  FINDINGS: Right Kidney:  Length: 9.5 cm. Mildly increased parenchymal echogenicity. No mass or hydronephrosis visualized.  Left Kidney:  Length: 9.1 cm. Mildly increased parenchymal echogenicity. No mass or hydronephrosis visualized.  Bladder:  Appears normal for degree of bladder distention.  A small amount of ascites is noted about the liver and spleen.  IMPRESSION: 1. No evidence of hydronephrosis. 2. Mildly increased renal parenchymal echogenicity may reflect medical renal disease. 3. Small amount of ascites noted about the liver and spleen.   Electronically Signed   By: Garald Balding M.D.    On: 01/09/2015 18:57   Dg Chest Portable 1 View  01/09/2015   CLINICAL DATA:  Shortness of breath and wheezing for 2 days.  EXAM: PORTABLE CHEST - 1 VIEW  COMPARISON:  PA and lateral chest 10/30/2014 and 10/30/2012.  FINDINGS: There is marked cardiomegaly and vascular congestion. No consolidative process, pneumothorax or effusion is seen.  IMPRESSION: Cardiomegaly and pulmonary vascular congestion.   Electronically Signed   By: Inge Rise M.D.   On: 01/09/2015 16:26     ASSESSMENT / PLAN:  RENAL A:   Acute on chronic kidney disease - progression from baseline, ? ESRD  Hyperkalemia - in the setting of renal failure AG metabolic acidosis - in the setting of uremia / renal failure Hyponatremia - in setting of renal failure Volume overload - concern for progression of CKD to ESRD.  P:   Nephrology following: Dialysis 2/1 & 2/2 Strict I/O's. BMP in AM.  CARDIOVASCULAR A:  Hx diastolic heart failure - Echo from 10/29/14: EF 60 - 65%, grade 3 DD, mod LVH, PAP 96. Elevated BNP Bradycardia - likely due to combination of metabolic acidosis with hyperkalemia and accumulation of outpatient metoprolol Intermittent hypotension - resolved Hx HLD, HTN, CAD, MI P:  Dialysis for fluid removal. Echo pending Holding outpatient Amlodipine, Lasix, Toprol XL, Nitro, Pravastatin.  PULMONARY A: Acute hypoxic respiratory failure Hx COPD without evidence of exacerbation - no PFT's accessible in system PAH as suggested by echo from 10/29/14 - PAP 96 P:   Continue supplemental O2 as needed to maintain SpO2 > 92%. DuoNebs, Brovana, Budesonide. Hold outpatient Spiriva Respimat, Dulera. Consider outpatient follow up for further eval of PAH.  GASTROINTESTINAL A:   GERD Mild ascites seen on renal US Nutrition P:   Pantoprazole. NPO except meds for now.  HEMATOLOGIC A:   Chronic anemia VTE Prophylaxis Plts: 200 (2/1) >> 93 (2/2) P:  Transfuse per usual ICU guidelines. SCD's /  Heparin. CBC in AM; Monitor plts  INFECTIOUS A:   No indication of infection at this point P:   UCx 2/1 >>> Monitor clinically.  ENDOCRINE A:   DM R/o AI - has prednisone 50mg  daily listed on outpatient meds P:   Cortisol wnl CBG's q4hr. SSI. Hold outpatient Glipizide, Lantus,  NEUROLOGIC A:   Hx CVA 2011 Pain P:   Continue daily ASA. Hold outpatient Oxycodone.  Family updated: None  Interdisciplinary Family Meeting v Palliative Care Meeting:  Due by: 2/8.  Olam Idler, MD 01/10/2015, 8:46 AM PGY-2, [place]  Attending Note:  I have examined patient, reviewed labs, studies and notes. I have discussed the case with Dr Berkley Harvey, and I agree with the data and plans as amended above.  She is improved post-HD. Unclear to me whether she will  now be committed to long-term dialysis. She is hopeful that this will not be the case. I will transfer her to floor bed, to [facility] as of 2/3.   Baltazar Apo, MD, PhD 01/10/2015, 2:30 PM [facility] [phone] or if no answer [phone]

## 2015-01-11 DIAGNOSIS — I509 Heart failure, unspecified: Secondary | ICD-10-CM

## 2015-01-11 LAB — GLUCOSE, CAPILLARY
GLUCOSE-CAPILLARY: 200 mg/dL — AB (ref 70–99)
GLUCOSE-CAPILLARY: 154 mg/dL — AB (ref 70–99)
Glucose-Capillary: 150 mg/dL — ABNORMAL HIGH (ref 70–99)
Glucose-Capillary: 211 mg/dL — ABNORMAL HIGH (ref 70–99)

## 2015-01-11 LAB — MAGNESIUM: Magnesium: 2.3 mg/dL (ref 1.5–2.5)

## 2015-01-11 LAB — BASIC METABOLIC PANEL
Anion gap: 15 (ref 5–15)
BUN: 70 mg/dL — ABNORMAL HIGH (ref 6–23)
CO2: 21 mmol/L (ref 19–32)
Calcium: 8.5 mg/dL (ref 8.4–10.5)
Chloride: 99 mmol/L (ref 96–112)
Creatinine, Ser: 7.26 mg/dL — ABNORMAL HIGH (ref 0.50–1.10)
GFR calc Af Amer: 6 mL/min — ABNORMAL LOW (ref >90)
GFR calc non Af Amer: 5 mL/min — ABNORMAL LOW (ref >90)
Glucose, Bld: 135 mg/dL — ABNORMAL HIGH (ref 70–99)
Potassium: 4 mmol/L (ref 3.5–5.1)
Sodium: 135 mmol/L (ref 135–145)

## 2015-01-11 LAB — PARATHYROID HORMONE, INTACT (NO CA): PTH: 397 pg/mL — ABNORMAL HIGH (ref 15–65)

## 2015-01-11 LAB — PHOSPHORUS: Phosphorus: 8.3 mg/dL — ABNORMAL HIGH (ref 2.3–4.6)

## 2015-01-11 LAB — HEPATITIS B SURFACE ANTIBODY,QUALITATIVE: Hep B S Ab: NONREACTIVE — AB

## 2015-01-11 LAB — CLOSTRIDIUM DIFFICILE BY PCR: Toxigenic C. Difficile by PCR: NEGATIVE

## 2015-01-11 MED ORDER — CEPHALEXIN 250 MG PO CAPS
250.0000 mg | ORAL_CAPSULE | Freq: Two times a day (BID) | ORAL | Status: DC
  Administered 2015-01-11 (×2): 250 mg via ORAL
  Filled 2015-01-11 (×4): qty 1

## 2015-01-11 MED ORDER — DARBEPOETIN ALFA 40 MCG/0.4ML IJ SOSY
40.0000 ug | PREFILLED_SYRINGE | INTRAMUSCULAR | Status: DC
  Filled 2015-01-11: qty 0.4

## 2015-01-11 MED ORDER — CALCIUM ACETATE 667 MG PO CAPS
667.0000 mg | ORAL_CAPSULE | Freq: Three times a day (TID) | ORAL | Status: DC
  Administered 2015-01-11 – 2015-01-25 (×18): 667 mg via ORAL
  Filled 2015-01-11 (×45): qty 1

## 2015-01-11 MED ORDER — SODIUM CHLORIDE 0.9 % IV SOLN
25.0000 mg | Freq: Once | INTRAVENOUS | Status: AC
  Administered 2015-01-11: 25 mg via INTRAVENOUS
  Filled 2015-01-11 (×2): qty 2

## 2015-01-11 MED ORDER — SODIUM CHLORIDE 0.9 % IV SOLN
125.0000 mg | INTRAVENOUS | Status: DC
  Administered 2015-01-14: 125 mg via INTRAVENOUS
  Filled 2015-01-11 (×5): qty 10

## 2015-01-11 MED ORDER — DOXERCALCIFEROL 4 MCG/2ML IV SOLN
2.0000 ug | INTRAVENOUS | Status: DC
  Administered 2015-01-14 – 2015-01-20 (×3): 2 ug via INTRAVENOUS
  Filled 2015-01-11 (×6): qty 2

## 2015-01-11 MED ORDER — CEPHALEXIN 250 MG PO CAPS: 250.0000 mg | ORAL_CAPSULE | Freq: Four times a day (QID) | ORAL | Status: DC

## 2015-01-11 NOTE — Progress Notes (Signed)
Family Medicine Teaching Service Daily Progress Note Intern Pager: [phone]  Patient name: Kristi [name] Medical record number: [id number] Date of birth: Apr 27, 1941 Age: 74 y.o. Gender: female  Primary Care Provider: Alease Frame Kristi Pearson, MD Consultants: CCM, Renal, Cardiology Code Status: Full  Pt Overview and Major Events to Date:  - 2/1: Admitted to ICU - 2/3: Out of ICU. Care transferred to [facility] Medicine Teaching Service  Assessment and Plan: 74yo female with acute renal failure admitted for emergent HD and for symptomatic bradycardia. 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 Next dialysis on 2/4 - Creatinine 7.26 this morning, improved from 11.8 at admission - BUN 70 - GFR 5/6 - Potassium normal at 4, improved from 5.5 at admission - Renal US- no evidence of hydronephrosis - Renal consulted. Appreciate recommendations.  # Bradycardia- suspected to be caused by accumulation of outpatient metoprolol as well as renal failure with acidosis and hyperkalemia - Holding home amlodipine, lasix, toprol XL, nitro, and pravastatin - Improved HR 64-80  # UTI - Urinalysis moderate leukocytes, large hemoglobin, many bacteria - Blood clots noted in urine. Suspect this is secondary to catheter. If continues to have blood clots, will further evaluate - Urine Culture - Keflex 250 B3AL  # H/O Diastolic Heart Failure- last echo 10/29/14 of EF 93-79%, grade 3 diastolic dysfunction, moderate LVH, PAP 96.  - BNP- 2273.8, unreliable due to ESRD - Weight 207 today, up from 195 yesterday. Suspect different scales are being used due to placement on different floor - Currently on 2L [place]. Will attempt trial of Room Air today.  # COPD - Continue Duonebs, Brovana, Budesonide - Holding Spiriva and Dulera  # Diabetes - Moderate  Sliding Scale with HS Coverage - [place] glipizide, lantus  # GERD - Continue Pantoprazole  FEN/GI: Renal Diet PPx: SQ Heparin  Disposition: Discharge home pending improvement in renal function and respiratory status.  Subjective:  No acute complaints overnight. Feeling some improvement, however still notes some shortness of breath and fatigue. Notes burning with urination, however does not complain of frequency. Nursing noted some blood clots in urine, however catheter was removed this morning. Notes blood clots in nose. Complains of lower back pain due to being hospitalized and requests donut pillow.  Initially presented on 2/1 with generalized weakness and shortness of breath. Was found to have elevated Creatinine to 11.8, hyperkalemia to 5.6, anion gap metabolic acidosis, and symptomatic bradycardia. Was admitted to ICU.  Objective: Temp:  [97.4 F (36.3 C)-98.2 F (36.8 C)] 98.2 F (36.8 C) (02/03 0454) Pulse Rate:  [63-80] 72 (02/03 0454) Resp:  [13-24] 21 (02/03 0454) BP: (96-114)/(30-69) 108/43 mmHg (02/03 0454) SpO2:  [94 %-98 %] 96 % (02/03 0454) Weight:  [189 lb 9.5 oz (86 kg)-207 lb 8 oz (94.121 kg)] 207 lb 8 oz (94.121 kg) (02/02 2030) Physical Exam: General: 74yo female resting comfortably in no apparent distress Cardiovascular: S1 and S2 noted. No murmurs noted. Regular rate and rhythm Respiratory: Clear to auscultation bilaterally. No wheezes noted. [place] in place. Abdomen: Bowel sounds noted. Soft and nondistended. No tenderness to palpation. No suprapubic tenderness. Extremities: No edema noted.  Laboratory:  Recent Labs Lab 01/09/15 1601 01/09/15 1611 01/09/15 2310 01/10/15 0440  WBC 6.6  --  9.0 6.3  HGB 10.2* 11.9* 10.1* 9.3*  HCT 31.1* 35.0* 30.0* 27.1*  PLT 200  --  114* 93*    Recent Labs Lab 01/09/15 1601 01/09/15 1611 01/09/15 1917 01/10/15 0440  NA 132* 131*  --  136  K 5.6* 5.5*  --  4.2  CL 98 101  --  99  CO2 11*  --   --  17*  BUN  151* 131*  --  102*  CREATININE 11.66* 11.80* 11.37* 9.17*  CALCIUM 8.4  --   --  8.3*  GLUCOSE 214* 202*  --  94   Iron/TIBC/Ferritin/ %Sat    Component Value Date/Time   IRON 27* 01/10/2015 0741   TIBC 322 01/10/2015 0741   FERRITIN 37 01/10/2015 0741   IRONPCTSAT 8* 01/10/2015 0741   Urinalysis    Component Value Date/Time   COLORURINE YELLOW 01/10/2015 0116   APPEARANCEUR TURBID* 01/10/2015 0116   LABSPEC 1.025 01/10/2015 0116   PHURINE 7.0 01/10/2015 0116   GLUCOSEU NEGATIVE 01/10/2015 0116   HGBUR LARGE* 01/10/2015 0116   HGBUR trace-lysed 03/08/2010 1328   BILIRUBINUR NEGATIVE 01/10/2015 0116   KETONESUR NEGATIVE 01/10/2015 0116   PROTEINUR 100* 01/10/2015 0116   UROBILINOGEN 1.0 01/10/2015 0116   NITRITE NEGATIVE 01/10/2015 0116   LEUKOCYTESUR MODERATE* 01/10/2015 0116   - BNP 2273.8, unreliable due to CKD - Hepatitis B negative  Imaging/Diagnostic Tests: US Renal  01/09/2015   CLINICAL DATA:  Acute onset of renal failure.  Initial encounter.  EXAM: RENAL/URINARY TRACT ULTRASOUND COMPLETE  COMPARISON:  None.  FINDINGS: Right Kidney:  Length: 9.5 cm. Mildly increased parenchymal echogenicity. No mass or hydronephrosis visualized.  Left Kidney:  Length: 9.1 cm. Mildly increased parenchymal echogenicity. No mass or hydronephrosis visualized.  Bladder:  Appears normal for degree of bladder distention.  A small amount of ascites is noted about the liver and spleen.  IMPRESSION: 1. No evidence of hydronephrosis. 2. Mildly increased renal parenchymal echogenicity may reflect medical renal disease. 3. Small amount of ascites noted about the liver and spleen.   Electronically Signed   By: Garald Balding M.D.   On: 01/09/2015 18:57   Dg Chest Portable 1 View  01/09/2015   CLINICAL DATA:  Shortness of breath and wheezing for 2 days.  EXAM: PORTABLE CHEST - 1 VIEW  COMPARISON:  PA and lateral chest 10/30/2014 and 10/30/2012.  FINDINGS: There is marked cardiomegaly and vascular  congestion. No consolidative process, pneumothorax or effusion is seen.  IMPRESSION: Cardiomegaly and pulmonary vascular congestion.   Electronically Signed   By: Inge Rise M.D.   On: 01/09/2015 16:26   Lorna Few, DO 01/11/2015, 8:36 AM PGY-1, [place] Intern pager: [phone], text pages welcome

## 2015-01-11 NOTE — Progress Notes (Signed)
  Echocardiogram 2D Echocardiogram has been performed.  Kristi [name] 01/11/2015, 3:58 PM

## 2015-01-11 NOTE — Progress Notes (Signed)
S: CO diarrhea for weeks.  CO lethargy O:BP 98/40 mmHg  Pulse 88  Temp(Src) 98.4 F (36.9 C) (Oral)  Resp 20  Ht 5' (1.524 m)  Wt 94.121 kg (207 lb 8 oz)  BMI 40.52 kg/m2  SpO2 96%  Intake/Output Summary (Last 24 hours) at 01/11/15 1110 Last data filed at 01/11/15 1009  Gross per 24 hour  Intake    480 ml  Output   1760 ml  Net  -1280 ml   Weight change: -0.352 kg (-12.4 oz) OXB:DZHGD and alert  CVS:RRR Resp:Faint basilar crackles Abd:+ BS NTND Ext: tr edema NEURO: Ox3 + asterixis, OX3   . arformoterol  15 mcg Nebulization BID  . aspirin  325 mg Oral Daily  . budesonide  0.5 mg Nebulization BID  . heparin  5,000 Units Subcutaneous 3 times per day  . insulin aspart  0-15 Units Subcutaneous TID WC  . insulin aspart  0-5 Units Subcutaneous QHS  . ipratropium-albuterol  3 mL Nebulization QID  . multivitamin  1 tablet Oral QHS  . pantoprazole  40 mg Oral Daily  . sodium chloride  3 mL Intravenous Q12H   US Renal  01/09/2015   CLINICAL DATA:  Acute onset of renal failure.  Initial encounter.  EXAM: RENAL/URINARY TRACT ULTRASOUND COMPLETE  COMPARISON:  None.  FINDINGS: Right Kidney:  Length: 9.5 cm. Mildly increased parenchymal echogenicity. No mass or hydronephrosis visualized.  Left Kidney:  Length: 9.1 cm. Mildly increased parenchymal echogenicity. No mass or hydronephrosis visualized.  Bladder:  Appears normal for degree of bladder distention.  A small amount of ascites is noted about the liver and spleen.  IMPRESSION: 1. No evidence of hydronephrosis. 2. Mildly increased renal parenchymal echogenicity may reflect medical renal disease. 3. Small amount of ascites noted about the liver and spleen.   Electronically Signed   By: Garald Balding M.D.   On: 01/09/2015 18:57   Dg Chest Portable 1 View  01/09/2015   CLINICAL DATA:  Shortness of breath and wheezing for 2 days.  EXAM: PORTABLE CHEST - 1 VIEW  COMPARISON:  PA and lateral chest 10/30/2014 and 10/30/2012.  FINDINGS: There is  marked cardiomegaly and vascular congestion. No consolidative process, pneumothorax or effusion is seen.  IMPRESSION: Cardiomegaly and pulmonary vascular congestion.   Electronically Signed   By: Inge Rise M.D.   On: 01/09/2015 16:26   BMET    Component Value Date/Time   NA 135 01/11/2015 0716   NA 141 08/25/2012 1033   K 4.0 01/11/2015 0716   K 4.3 08/25/2012 1033   CL 99 01/11/2015 0716   CL 108* 08/25/2012 1033   CO2 21 01/11/2015 0716   CO2 19* 08/25/2012 1033   GLUCOSE 135* 01/11/2015 0716   GLUCOSE 77 08/25/2012 1033   BUN 70* 01/11/2015 0716   BUN 68.0* 08/25/2012 1033   CREATININE 7.26* 01/11/2015 0716   CREATININE 2.61* 11/09/2014 0908   CREATININE 2.8* 05/10/2014   CREATININE 2.9* 08/25/2012 1033   CALCIUM 8.5 01/11/2015 0716   CALCIUM 9.5 08/25/2012 1033   CALCIUM 9.9 03/29/2010 0000   GFRNONAA 5* 01/11/2015 0716   GFRNONAA 24* 08/23/2011 1059   GFRAA 6* 01/11/2015 0716   GFRAA 30* 08/23/2011 1059   CBC    Component Value Date/Time   WBC 6.3 01/10/2015 0440   WBC 11.0* 08/25/2012 1033   RBC 3.17* 01/10/2015 0440   RBC 4.38 08/25/2012 1033   HGB 9.3* 01/10/2015 0440   HGB 12.0 08/25/2012 1033  HCT 27.1* 01/10/2015 0440   HCT 36.9 08/25/2012 1033   PLT 93* 01/10/2015 0440   PLT 154 08/25/2012 1033   MCV 85.5 01/10/2015 0440   MCV 84.2 08/25/2012 1033   MCH 29.3 01/10/2015 0440   MCH 27.4 08/25/2012 1033   MCHC 34.3 01/10/2015 0440   MCHC 32.5 08/25/2012 1033   RDW 18.5* 01/10/2015 0440   RDW 16.5* 08/25/2012 1033   LYMPHSABS 1.4 11/09/2014 0908   LYMPHSABS 1.8 08/25/2012 1033   MONOABS 0.4 11/09/2014 0908   MONOABS 0.9 08/25/2012 1033   EOSABS 0.3 11/09/2014 0908   EOSABS 0.3 08/25/2012 1033   BASOSABS 0.0 11/09/2014 0908   BASOSABS 0.1 08/25/2012 1033     Assessment: 1. New ESRD 2. Pulm edema 3. Hyperkalemia, improved 4 Pulm HTN 5. Anemia and fe def 6. Gm neg rod UTI, consider starting empiric AB  Plan: 1. HD tomorrow and will  recheck labs then 2. Check C diff  3. Clip process underway 4. IV iron 5. Start phoslo 6. Start aranesp Ayanni Tun T

## 2015-01-12 ENCOUNTER — Inpatient Hospital Stay (HOSPITAL_COMMUNITY): Payer: Commercial Managed Care - HMO

## 2015-01-12 ENCOUNTER — Inpatient Hospital Stay (HOSPITAL_COMMUNITY): Payer: Commercial Managed Care - HMO | Admitting: Anesthesiology

## 2015-01-12 ENCOUNTER — Encounter (HOSPITAL_COMMUNITY)
Admission: EM | Disposition: A | Discharge status: Skilled Nursing Facility | Payer: Self-pay | Source: Home / Self Care | Attending: Family Medicine

## 2015-01-12 ENCOUNTER — Encounter (HOSPITAL_COMMUNITY): Payer: Self-pay | Admitting: Anesthesiology

## 2015-01-12 DIAGNOSIS — N184 Chronic kidney disease, stage 4 (severe): Secondary | ICD-10-CM

## 2015-01-12 HISTORY — PX: INSERTION OF DIALYSIS CATHETER: SHX1324

## 2015-01-12 LAB — URINE CULTURE: Colony Count: >=100000

## 2015-01-12 LAB — GLUCOSE, CAPILLARY
GLUCOSE-CAPILLARY: 168 mg/dL — AB (ref 70–99)
Glucose-Capillary: 195 mg/dL — ABNORMAL HIGH (ref 70–99)
Glucose-Capillary: 170 mg/dL — ABNORMAL HIGH (ref 70–99)
Glucose-Capillary: 157 mg/dL — ABNORMAL HIGH (ref 70–99)
Glucose-Capillary: 158 mg/dL — ABNORMAL HIGH (ref 70–99)

## 2015-01-12 LAB — BASIC METABOLIC PANEL
ANION GAP: 14 (ref 5–15)
BUN: 80 mg/dL — AB (ref 6–23)
CO2: 23 mmol/L (ref 19–32)
Calcium: 8.9 mg/dL (ref 8.4–10.5)
Chloride: 98 mmol/L (ref 96–112)
Creatinine, Ser: 7.76 mg/dL — ABNORMAL HIGH (ref 0.50–1.10)
GFR calc Af Amer: 5 mL/min — ABNORMAL LOW (ref >90)
GFR calc non Af Amer: 5 mL/min — ABNORMAL LOW (ref >90)
Glucose, Bld: 168 mg/dL — ABNORMAL HIGH (ref 70–99)
POTASSIUM: 3.6 mmol/L (ref 3.5–5.1)
Sodium: 135 mmol/L (ref 135–145)

## 2015-01-12 SURGERY — INSERTION OF DIALYSIS CATHETER
Anesthesia: Monitor Anesthesia Care | Site: Neck

## 2015-01-12 MED ORDER — LIDOCAINE-EPINEPHRINE (PF) 1 %-1:200000 IJ SOLN
INTRAMUSCULAR | Status: DC | PRN
  Administered 2015-01-12: 18 mL

## 2015-01-12 MED ORDER — MIDAZOLAM HCL 2 MG/2ML IJ SOLN
INTRAMUSCULAR | Status: AC
  Filled 2015-01-12: qty 2

## 2015-01-12 MED ORDER — SODIUM CHLORIDE 0.9 % IR SOLN
Status: DC | PRN
  Administered 2015-01-12: 500 mL

## 2015-01-12 MED ORDER — ACETAMINOPHEN 500 MG PO TABS: 500.0000 mg | ORAL_TABLET | Freq: Four times a day (QID) | ORAL | Status: DC | PRN

## 2015-01-12 MED ORDER — CIPROFLOXACIN IN D5W 400 MG/200ML IV SOLN
400.0000 mg | INTRAVENOUS | Status: AC
  Administered 2015-01-12 – 2015-01-15 (×4): 400 mg via INTRAVENOUS
  Filled 2015-01-12 (×5): qty 200

## 2015-01-12 MED ORDER — BENZONATATE 100 MG PO CAPS: 100.0000 mg | ORAL_CAPSULE | Freq: Two times a day (BID) | ORAL | Status: DC | PRN

## 2015-01-12 MED ORDER — DEXTROSE 5 % IV SOLN
1.5000 g | INTRAVENOUS | Status: AC
  Filled 2015-01-12: qty 1.5

## 2015-01-12 MED ORDER — ONDANSETRON HCL 4 MG/2ML IJ SOLN: 4.0000 mg | Freq: Once | INTRAMUSCULAR | Status: DC | PRN

## 2015-01-12 MED ORDER — ONDANSETRON HCL 4 MG/2ML IJ SOLN
INTRAMUSCULAR | Status: DC | PRN
  Administered 2015-01-12: 4 mg via INTRAVENOUS

## 2015-01-12 MED ORDER — MIDAZOLAM HCL 5 MG/5ML IJ SOLN
INTRAMUSCULAR | Status: DC | PRN
  Administered 2015-01-12 (×2): 1 mg via INTRAVENOUS

## 2015-01-12 MED ORDER — FENTANYL CITRATE 0.05 MG/ML IJ SOLN: 25.0000 ug | INTRAMUSCULAR | Status: DC | PRN

## 2015-01-12 MED ORDER — CIPROFLOXACIN HCL 500 MG PO TABS
500.0000 mg | ORAL_TABLET | Freq: Every day | ORAL | Status: DC
  Filled 2015-01-12 (×2): qty 1

## 2015-01-12 MED ORDER — FENTANYL CITRATE 0.05 MG/ML IJ SOLN
INTRAMUSCULAR | Status: DC | PRN
  Administered 2015-01-12 (×2): 50 ug via INTRAVENOUS

## 2015-01-12 MED ORDER — SODIUM CHLORIDE 0.9 % IV SOLN
INTRAVENOUS | Status: DC | PRN
  Administered 2015-01-12: 13:00:00 via INTRAVENOUS

## 2015-01-12 MED ORDER — FENTANYL CITRATE 0.05 MG/ML IJ SOLN
INTRAMUSCULAR | Status: AC
  Filled 2015-01-12: qty 5

## 2015-01-12 MED ORDER — PROMETHAZINE HCL 25 MG PO TABS
12.5000 mg | ORAL_TABLET | Freq: Three times a day (TID) | ORAL | Status: DC | PRN
  Administered 2015-01-12: 12.5 mg via ORAL
  Filled 2015-01-12 (×2): qty 1

## 2015-01-12 MED ORDER — SODIUM CHLORIDE 0.9 % IV SOLN
INTRAVENOUS | Status: DC
  Administered 2015-01-12: 13:00:00 via INTRAVENOUS

## 2015-01-12 MED ORDER — HEPARIN SODIUM (PORCINE) 1000 UNIT/ML IJ SOLN
INTRAMUSCULAR | Status: AC
  Filled 2015-01-12: qty 1

## 2015-01-12 MED ORDER — PHENYLEPHRINE HCL 10 MG/ML IJ SOLN
INTRAMUSCULAR | Status: DC | PRN
  Administered 2015-01-12 (×3): 80 ug via INTRAVENOUS

## 2015-01-12 MED ORDER — HEPARIN SODIUM (PORCINE) 5000 UNIT/ML IJ SOLN
5000.0000 [IU] | Freq: Three times a day (TID) | INTRAMUSCULAR | Status: DC
  Administered 2015-01-12 – 2015-01-13 (×2): 5000 [IU] via SUBCUTANEOUS
  Filled 2015-01-12 (×3): qty 1

## 2015-01-12 MED ORDER — LIDOCAINE-EPINEPHRINE (PF) 1 %-1:200000 IJ SOLN
INTRAMUSCULAR | Status: AC
  Filled 2015-01-12: qty 10

## 2015-01-12 MED ORDER — 0.9 % SODIUM CHLORIDE (POUR BTL) OPTIME
TOPICAL | Status: DC | PRN
  Administered 2015-01-12: 1000 mL

## 2015-01-12 MED ORDER — CEFAZOLIN SODIUM-DEXTROSE 2-3 GM-% IV SOLR
INTRAVENOUS | Status: DC | PRN
  Administered 2015-01-12: 2 g via INTRAVENOUS

## 2015-01-12 MED ORDER — SIMETHICONE 80 MG PO CHEW
80.0000 mg | CHEWABLE_TABLET | Freq: Once | ORAL | Status: AC
  Administered 2015-01-12: 80 mg via ORAL
  Filled 2015-01-12: qty 1

## 2015-01-12 MED ORDER — ACETAMINOPHEN 500 MG PO TABS: 500.0000 mg | ORAL_TABLET | Freq: Once | ORAL | Status: AC | PRN

## 2015-01-12 SURGICAL SUPPLY — 42 items
BAG DECANTER FOR FLEXI CONT (MISCELLANEOUS) ×3 IMPLANT
BIOPATCH RED 1 DISK 7.0 (GAUZE/BANDAGES/DRESSINGS) ×2 IMPLANT
BIOPATCH RED 1IN DISK 7.0MM (GAUZE/BANDAGES/DRESSINGS) ×1
CATH CANNON HEMO 15F 50CM (CATHETERS) IMPLANT
CATH CANNON HEMO 15FR 19 (HEMODIALYSIS SUPPLIES) IMPLANT
CATH CANNON HEMO 15FR 23CM (HEMODIALYSIS SUPPLIES) ×3 IMPLANT
CATH CANNON HEMO 15FR 31CM (HEMODIALYSIS SUPPLIES) IMPLANT
CATH CANNON HEMO 15FR 32CM (HEMODIALYSIS SUPPLIES) IMPLANT
CATH STRAIGHT 5FR 65CM (CATHETERS) IMPLANT
COVER PROBE W GEL 5X96 (DRAPES) ×3 IMPLANT
COVER SURGICAL LIGHT HANDLE (MISCELLANEOUS) ×3 IMPLANT
DRAPE C-ARM 42X72 X-RAY (DRAPES) ×3 IMPLANT
DRAPE CHEST BREAST 15X10 FENES (DRAPES) ×3 IMPLANT
GAUZE SPONGE 2X2 8PLY STRL LF (GAUZE/BANDAGES/DRESSINGS) ×1 IMPLANT
GAUZE SPONGE 4X4 16PLY XRAY LF (GAUZE/BANDAGES/DRESSINGS) ×3 IMPLANT
GLOVE BIO SURGEON STRL SZ7 (GLOVE) ×3 IMPLANT
GLOVE BIOGEL PI IND STRL 7.5 (GLOVE) ×1 IMPLANT
GLOVE BIOGEL PI INDICATOR 7.5 (GLOVE) ×2
GOWN STRL REUS W/ TWL LRG LVL3 (GOWN DISPOSABLE) ×2 IMPLANT
GOWN STRL REUS W/TWL LRG LVL3 (GOWN DISPOSABLE) ×4
KIT BASIN OR (CUSTOM PROCEDURE TRAY) ×3 IMPLANT
KIT ROOM TURNOVER OR (KITS) ×3 IMPLANT
LIQUID BAND (GAUZE/BANDAGES/DRESSINGS) ×3 IMPLANT
NEEDLE 18GX1X1/2 (RX/OR ONLY) (NEEDLE) ×3 IMPLANT
NEEDLE HYPO 25GX1X1/2 BEV (NEEDLE) ×3 IMPLANT
NEEDLE PERC 18GX7CM (NEEDLE) ×3 IMPLANT
NS IRRIG 1000ML POUR BTL (IV SOLUTION) ×3 IMPLANT
PACK SURGICAL SETUP 50X90 (CUSTOM PROCEDURE TRAY) ×3 IMPLANT
PAD ARMBOARD 7.5X6 YLW CONV (MISCELLANEOUS) ×6 IMPLANT
SET MICROPUNCTURE 5F STIFF (MISCELLANEOUS) IMPLANT
SOAP 2 % CHG 4 OZ (WOUND CARE) ×3 IMPLANT
SPONGE GAUZE 2X2 STER 10/PKG (GAUZE/BANDAGES/DRESSINGS) ×2
SUT ETHILON 3 0 PS 1 (SUTURE) ×3 IMPLANT
SUT MNCRL AB 4-0 PS2 18 (SUTURE) ×3 IMPLANT
SYR 20CC LL (SYRINGE) ×6 IMPLANT
SYR 3ML LL SCALE MARK (SYRINGE) ×3 IMPLANT
SYR 5ML LL (SYRINGE) ×3 IMPLANT
SYR CONTROL 10ML LL (SYRINGE) ×3 IMPLANT
SYRINGE 10CC LL (SYRINGE) ×3 IMPLANT
TAPE CLOTH SURG 4X10 WHT LF (GAUZE/BANDAGES/DRESSINGS) ×3 IMPLANT
WATER STERILE IRR 1000ML POUR (IV SOLUTION) ×3 IMPLANT
WIRE AMPLATZ SS-J .035X180CM (WIRE) IMPLANT

## 2015-01-12 NOTE — Progress Notes (Signed)
Pt L arm fistula infiltrated. Kristi [name] dialysis advised Dr Mercy Moore.  Pt back in room.

## 2015-01-12 NOTE — Interval H&P Note (Signed)
History and Physical Interval Note:  01/12/2015 1:19 PM  Kristi [name]  has presented today for surgery, with the diagnosis of Stage IV Chronic Kidney Disease N18.4  The various methods of treatment have been discussed with the patient and family. After consideration of risks, benefits and other options for treatment, the patient has consented to  Procedure(s): INSERTION OF DIALYSIS CATHETER (N/A) as a surgical intervention .  The patient's history has been reviewed, patient examined, no change in status, stable for surgery.  I have reviewed the patient's chart and labs.  Questions were answered to the patient's satisfaction.     CHEN,BRIAN LIANG-YU

## 2015-01-12 NOTE — Progress Notes (Signed)
Pt has lots of belching.  Pt also has persistant cough, paged Family Medicine for something for cough and belching.  Dr. Gerlean Ren called back and said they would check to see what they could give due to EKG.

## 2015-01-12 NOTE — Progress Notes (Signed)
Per Dr. Mercy Moore, keep pt NPO for now surgery will be by to see patient.

## 2015-01-12 NOTE — Procedures (Signed)
Patient was seen on dialysis and the procedure was supervised.  BFR 250  Via AVF BP is  121/53.   Patient appears to be tolerating treatment well  Kristi [name] A 01/12/2015

## 2015-01-12 NOTE — Progress Notes (Signed)
Pt back from dialysis pt alert and oriented.  Denies pain. Pt to go to dialysis @ 45 minutes

## 2015-01-12 NOTE — Anesthesia Postprocedure Evaluation (Signed)
Anesthesia Post Note  Patient: Kristi [name]  Procedure(s) Performed: Procedure(s) (LRB): INSERTION OF DIALYSIS CATHETER (N/A)  Anesthesia type: MAC  Patient location: PACU  Post pain: Pain level controlled and Adequate analgesia  Post assessment: Post-op Vital signs reviewed, Patient's Cardiovascular Status Stable and Respiratory Function Stable  Last Vitals:  Filed Vitals:   01/12/15 1515  BP: 118/47  Pulse: 85  Temp: 36.6 C  Resp: 23    Post vital signs: Reviewed and stable  Level of consciousness: awake, alert  and oriented  Complications: No apparent anesthesia complications

## 2015-01-12 NOTE — Progress Notes (Signed)
S: CO nausea.  Says diarrhea better.  C diff neg O:BP 136/61 mmHg  Pulse 95  Temp(Src) 98.8 F (37.1 C) (Oral)  Resp 20  Ht 5' (1.524 m)  Wt 94.711 kg (208 lb 12.8 oz)  BMI 40.78 kg/m2  SpO2 90%  Intake/Output Summary (Last 24 hours) at 01/12/15 0916 Last data filed at 01/11/15 1700  Gross per 24 hour  Intake    360 ml  Output     51 ml  Net    309 ml   Weight change: 6.611 kg (14 lb 9.2 oz) OBS:JGGEZ and alert  CVS:RRR Resp:Faint basilar crackles Abd:+ BS NTND Ext: tr edema  Infiltration of AVF NEURO: Ox3 + asterixis, OX3   . arformoterol  15 mcg Nebulization BID  . aspirin  325 mg Oral Daily  . budesonide  0.5 mg Nebulization BID  . calcium acetate  667 mg Oral TID WC  . ciprofloxacin  500 mg Oral Q breakfast  . darbepoetin (ARANESP) injection - DIALYSIS  40 mcg Intravenous Q Thu-HD  . doxercalciferol  2 mcg Intravenous Q T,Th,Sa-HD  . ferric gluconate (FERRLECIT/NULECIT) IV  125 mg Intravenous Q T,Th,Sa-HD  . heparin  5,000 Units Subcutaneous 3 times per day  . insulin aspart  0-15 Units Subcutaneous TID WC  . insulin aspart  0-5 Units Subcutaneous QHS  . ipratropium-albuterol  3 mL Nebulization QID  . multivitamin  1 tablet Oral QHS  . pantoprazole  40 mg Oral Daily  . sodium chloride  3 mL Intravenous Q12H   No results found. BMET    Component Value Date/Time   NA 135 01/11/2015 0716   NA 141 08/25/2012 1033   K 4.0 01/11/2015 0716   K 4.3 08/25/2012 1033   CL 99 01/11/2015 0716   CL 108* 08/25/2012 1033   CO2 21 01/11/2015 0716   CO2 19* 08/25/2012 1033   GLUCOSE 135* 01/11/2015 0716   GLUCOSE 77 08/25/2012 1033   BUN 70* 01/11/2015 0716   BUN 68.0* 08/25/2012 1033   CREATININE 7.26* 01/11/2015 0716   CREATININE 2.61* 11/09/2014 0908   CREATININE 2.8* 05/10/2014   CREATININE 2.9* 08/25/2012 1033   CALCIUM 8.5 01/11/2015 0716   CALCIUM 9.5 08/25/2012 1033   CALCIUM 9.9 03/29/2010 0000   GFRNONAA 5* 01/11/2015 0716   GFRNONAA 24* 08/23/2011 1059    GFRAA 6* 01/11/2015 0716   GFRAA 30* 08/23/2011 1059   CBC    Component Value Date/Time   WBC 6.3 01/10/2015 0440   WBC 11.0* 08/25/2012 1033   RBC 3.17* 01/10/2015 0440   RBC 4.38 08/25/2012 1033   HGB 9.3* 01/10/2015 0440   HGB 12.0 08/25/2012 1033   HCT 27.1* 01/10/2015 0440   HCT 36.9 08/25/2012 1033   PLT 93* 01/10/2015 0440   PLT 154 08/25/2012 1033   MCV 85.5 01/10/2015 0440   MCV 84.2 08/25/2012 1033   MCH 29.3 01/10/2015 0440   MCH 27.4 08/25/2012 1033   MCHC 34.3 01/10/2015 0440   MCHC 32.5 08/25/2012 1033   RDW 18.5* 01/10/2015 0440   RDW 16.5* 08/25/2012 1033   LYMPHSABS 1.4 11/09/2014 0908   LYMPHSABS 1.8 08/25/2012 1033   MONOABS 0.4 11/09/2014 0908   MONOABS 0.9 08/25/2012 1033   EOSABS 0.3 11/09/2014 0908   EOSABS 0.3 08/25/2012 1033   BASOSABS 0.0 11/09/2014 0908   BASOSABS 0.1 08/25/2012 1033     Assessment: 1. New ESRD 2. Pulm edema 3. Hyperkalemia, improved 4 Pulm HTN 5. Anemia and fe def,  receiving IV iron 6. Enterobacter UTI  Plan: 1. VVS called to place perm cath 2. Plan HD after permcath placed  Kristi [name] T

## 2015-01-12 NOTE — Progress Notes (Signed)
Pt O2 sat per RT 88% on 2 lpm via [place]. RT turned O2 up to 3 lpm via [place] O2 sat 90% on 3 lpm.

## 2015-01-12 NOTE — Anesthesia Preprocedure Evaluation (Signed)
Anesthesia Evaluation  Patient identified by MRN, date of birth, ID band Patient awake    Reviewed: Allergy & Precautions, NPO status , Patient's Chart, lab work & pertinent test results  Airway Mallampati: II   Neck ROM: full    Dental   Pulmonary COPDformer smoker,          Cardiovascular hypertension, + CAD, + Past MI, + Cardiac Stents, + Peripheral Vascular Disease and +CHF     Neuro/Psych Depression CVA    GI/Hepatic GERD-  ,  Endo/Other  diabetes, Type 2Morbid obesity  Renal/GU ESRF and DialysisRenal disease     Musculoskeletal  (+) Arthritis -,   Abdominal   Peds  Hematology   Anesthesia Other Findings   Reproductive/Obstetrics                             Anesthesia Physical Anesthesia Plan  ASA: IV  Anesthesia Plan: General   Post-op Pain Management:    Induction: Intravenous  Airway Management Planned: LMA  Additional Equipment:   Intra-op Plan:   Post-operative Plan:   Informed Consent: I have reviewed the patients History and Physical, chart, labs and discussed the procedure including the risks, benefits and alternatives for the proposed anesthesia with the patient or authorized representative who has indicated his/her understanding and acceptance.     Plan Discussed with: CRNA, Anesthesiologist and Surgeon  Anesthesia Plan Comments:         Anesthesia Quick Evaluation

## 2015-01-12 NOTE — Consult Note (Signed)
Hospital Consult    Reason for Consult:  Needs diatek  Referring Physician:  Mercy Moore MRN #:  [id number]  History of Present Illness: This is a 74 y.o. female who was admitted on 01/09/15 who has a hx of CKD IV.  She had progressive SOB, pedal edema and weakness all over.  In the ED, she was found to have pulmonary congestion/cardiomegaly and a creatinine of 11.8, hyperkalemia of 5.6 and anion gap metabolic acidosis.  She is s/p left RC AVF in 2012 with ligation of accessory branches.  She had a normal fistulogram in March 2013, which showed a widely patent fistula with no areas of significant stenosis within the fistula.  The radial artery was noted to be somewhat small.  The patient denies any steal sx. They started using her left RC AVF this past Monday and was successful Monday and Tuesday, however, when they tried to use it this morning, it infiltrated.  [facility] is consulted for diatek catheter placement.   Past Medical History  Diagnosis Date  . Hyperlipidemia   . Hyperparathyroidism   . CAD (coronary artery disease)   . Myocardial infarction 2008  . Hypertension   . COPD (chronic obstructive pulmonary disease)   . Diabetes mellitus     diagnosed 2010 - takes oral meds and lantus insulin  . Anemia   . Chronic kidney disease     esrd - since 2010  . GERD (gastroesophageal reflux disease)     on omeprazole  . Arthritis     all over  . CHF (congestive heart failure) 08-2011  . CVA 02/19/2010  . CAD 02/19/2010  . DEEP VENOUS THROMBOPHLEBITIS, LEG, LEFT 03/08/2010    in 2009 s/p hospitalization- only 1 isolated dvt  . Stroke ?2010    found remote CVA on CT scan  . Pulmonary artery hypertension 10/31/2014  . COPD exacerbation 10/31/2014    Past Surgical History  Procedure Laterality Date  . Av fistula placement  04/05/11    Left Radiocephalic AVF  . Appendectomy  1969  . Tubal ligation    . Revision of fisturl    . Coronary angioplasty with stent placement  06/23/2007    by Dr.  Irish Lack is her cardiologist  . Ligation goretex fistula  10/29/2011    Procedure: LIGATION GORE-TEX FISTULA;  Surgeon: Elam Dutch, MD;  Location: [facility] OR;  Service: Vascular;  Laterality: Left;  Ligation of competing branches left forearm fistula  . Fistulogram N/A 02/17/2012    Procedure: FISTULOGRAM;  Surgeon: Angelia Mould, MD;  Location: [facility] CATH LAB;  Service: Cardiovascular;  Laterality: N/A;    Allergies  Allergen Reactions  . Losartan Other (See Comments)    Causes increase in creatinine and worsening CKD  . Varenicline Nausea Only and Other (See Comments)    Dizzy and GI symptoms     Prior to Admission medications   Medication Sig Start Date End Date Taking? Authorizing Provider  acetaminophen (TYLENOL) 500 MG tablet Take 1,000 mg by mouth every 6 (six) hours as needed.   Yes Historical Provider, MD  acetaminophen-codeine (TYLENOL #3) 300-30 MG per tablet 1 tablet every 6 (six) hours as needed for moderate pain.  01/31/14  Yes Historical Provider, MD  albuterol (PROVENTIL HFA) 108 (90 BASE) MCG/ACT inhaler Inhale 2 puffs into the lungs every 4 (four) hours as needed. For wheezing 02/16/14  Yes Angelica Ran, MD  allopurinol (ZYLOPRIM) 100 MG tablet TAKE 2 TABLETS BY MOUTH EVERY DAY Patient taking differently: TAKE  3 TABLETS BY MOUTH EVERY DAY 12/12/14  Yes Elberta Leatherwood, MD  amLODipine (NORVASC) 5 MG tablet Take 1 tablet (5 mg total) by mouth daily. 09/16/14  Yes Elberta Leatherwood, MD  aspirin 325 MG tablet Take 325 mg by mouth at bedtime.    Yes Historical Provider, MD  [employer] ALLERGY RELIEF 180 MG tablet TAKE 1 TABLET BY MOUTH EVERY DAY Patient taking differently: TAKE 1 TABLET BY MOUTH as needed for allergies 02/14/13  Yes Angelica Ran, MD  furosemide (LASIX) 80 MG tablet Take 1 tablet (80 mg total) by mouth daily. Patient taking differently: Take 80-160 mg by mouth 2 (two) times daily. Takes 160mg  in am and 80 or 160mg depending in pm 11/02/14  Yes Crystal Libby Maw, MD    glipiZIDE (GLUCOTROL XL) 5 MG 24 hr tablet Take 1 tablet (5 mg total) by mouth daily with breakfast. Patient taking differently: Take 5 mg by mouth every evening.  09/16/14  Yes Elberta Leatherwood, MD  glucosamine-chondroitin 500-400 MG tablet Take 1 tablet by mouth 2 (two) times daily.     Yes Historical Provider, MD  HYDROcodone-acetaminophen (NORCO/VICODIN) 5-325 MG per tablet Take 1 tablet by mouth every 12 (twelve) hours as needed for moderate pain. 09/16/14  Yes Elberta Leatherwood, MD  insulin glargine (LANTUS) 100 UNIT/ML injection Inject 0.13 mLs (13 Units total) into the skin every morning. If blood sugar is >140 add 1 unit. If blood sugar is <140 subtract 1 unit. 03/18/14  Yes Angelica Ran, MD  metoprolol succinate (TOPROL-XL) 25 MG 24 hr tablet Take 0.5 tablets (12.5 mg total) by mouth daily. 09/16/14  Yes Elberta Leatherwood, MD  mometasone-formoterol (DULERA) 100-5 MCG/ACT AERO Inhale 2 puffs into the lungs 2 (two) times daily. Patient taking differently: Inhale 2 puffs into the lungs as needed for shortness of breath.  11/02/14  Yes Archie Patten, MD  nitroGLYCERIN (NITROSTAT) 0.4 MG SL tablet Place 0.4 mg under the tongue every 5 (five) minutes as needed. For chest pain     Call 911 if pain not relieved by 3rd tab   Yes Historical Provider, MD  NON FORMULARY Place 2 L into the nose continuous. 2 liters of o2   Yes Historical Provider, MD  Omega-3 Fatty Acids (FISH OIL) 1200 MG CAPS Take 2 capsules by mouth 2 (two) times daily.   Yes Historical Provider, MD  omeprazole (PRILOSEC) 20 MG capsule TAKE ONE CAPSULE BY MOUTH AT BEDTIME 04/27/14  Yes Angelica Ran, MD  oxyCODONE-acetaminophen (PERCOCET/ROXICET) 5-325 MG per tablet Take 1 tablet by mouth 3 (three) times daily as needed for severe pain.   Yes Historical Provider, MD  Polyethyl Glycol-Propyl Glycol (SYSTANE OP) Apply 1-2 drops to eye 4 (four) times daily as needed. For dry eyes   Yes Historical Provider, MD  pravastatin (PRAVACHOL) 40 MG  tablet Take 1 tablet (40 mg total) by mouth daily. Patient taking differently: Take 40 mg by mouth every evening.  09/16/14  Yes Elberta Leatherwood, MD  Tiotropium Bromide Monohydrate (SPIRIVA RESPIMAT) 2.5 MCG/ACT AERS Inhale 2 puffs once daily Patient taking differently: Inhale 2 puffs into the lungs as needed. Inhale 2 puffs once daily 03/24/14  Yes Zigmund Gottron, MD  vitamin B-12 (CYANOCOBALAMIN) 1000 MCG tablet Take 1,000 mcg by mouth every morning.    Yes Historical Provider, MD  benzonatate (TESSALON) 100 MG capsule Take 1 capsule (100 mg total) by mouth 3 (three) times daily as needed for cough. 11/02/14  Archie Patten, MD  doxycycline (VIBRA-TABS) 100 MG tablet Take 1 tablet (100 mg total) by mouth every 12 (twelve) hours. Starting tonight 11/02/14   Archie Patten, MD  glucose blood (ACCU-CHEK SMARTVIEW) test strip 1 each by Other route 2 (two) times daily. Use as instructed 12/19/14   Timmothy Euler, MD  Lancets (ACCU-CHEK SOFT TOUCH) lancets 1 each by Other route 2 (two) times daily. Use as instructed 12/19/14   Timmothy Euler, MD  predniSONE (DELTASONE) 50 MG tablet Take 1 tablet (50 mg total) by mouth daily with breakfast. 11/02/14   Archie Patten, MD    History   Social History  . Marital Status: Single    Spouse Name: N/A    Number of Children: 3  . Years of Education: N/A   Occupational History  . RetiredCatering manager Work    Social History Main Topics  . Smoking status: Former Smoker -- 0.50 packs/day for 53 years    Types: Cigarettes    Start date: 12/09/1960    Quit date: 03/31/2014  . Smokeless tobacco: Never Used  . Alcohol Use: No  . Drug Use: No  . Sexual Activity: Not on file   Other Topics Concern  . Not on file   Social History Narrative   Health Care POA: [place]   Emergency Contact: Suzan Slick    End of Life Plan:    Who lives with you: Lives by her self in 1 story home   Any pets:    Diet: Patient typically  eats frozen meal/sandwiches.  Has problems standing to cook.   Exercise: Patient does not have a regular exercise plan.   Seatbelts: Patient reports wearing seat belt when in vehicle.   Sun Exposure/Protection: Patient does not wear sun protection.   Hobbies: Crafts, knitting, crotcheting           Family History  Problem Relation Age of Onset  . Cancer Mother     multiple myloma  . Dementia Father   . Pneumonia Father   . Anuerysm Father     ROS: [x]  Positive   [ ]  Negative   [ ]  All sytems reviewed and are negative  Cardiovascular: []  chest pain/pressure []  palpitations []  SOB lying flat [x]  DOE []  pain in legs while walking []  pain in legs at rest []  pain in legs at night []  non-healing ulcers []  hx of DVT [x]  swelling in legs  Pulmonary: []  productive cough []  asthma/wheezing []  home O2  Neurologic: []  weakness in []  arms []  legs []  numbness in []  arms []  legs []  hx of CVA []  mini stroke [] difficulty speaking or slurred speech []  temporary loss of vision in one eye []  dizziness  Hematologic: []  hx of cancer []  bleeding problems []  problems with blood clotting easily  Endocrine:   []  diabetes []  thyroid disease  GI []  vomiting blood []  blood in stool [x]  recent constipation leading to diarrhea [x]  nausea/vomiting this am x 1  GU: [x]  CKD/renal failure [x]  HD (new)--[]  M/W/F or []  T/T/S []  burning with urination []  blood in urine  Psychiatric: [x]  anxiety/nervousness []  depression  Musculoskeletal: []  arthritis []  joint pain [x]  myalgias  Integumentary: []  rashes []  ulcers  Constitutional: []  fever []  chills [x]  malaise/fatigue   Physical Examination  Filed Vitals:   01/12/15 1013  BP: 101/45  Pulse: 70  Temp: 98.5 F (36.9 C)  Resp: 21   Body mass index is 40.78 kg/(m^2).  General:  WDWN obese female in NAD Gait: Not observed HENT: WNL, normocephalic Pulmonary: normal non-labored breathing, without Rales, rhonchi,   wheezing Cardiac: regular, without  Murmurs, rubs or gallops; without carotid bruits Abdomen: soft, NT/ND, no masses, obese Skin: without rashes, without ulcers  Vascular Exam/Pulses:  Right Left  Radial 2+ (normal) 2+ (normal)  DP 2+ (normal) 2+ (normal)  PT Unable to palpate  Unable to palpate    Extremities: without ischemic changes, without Gangrene , without cellulitis; without open wounds; she does have discoloration on the tips of her fingers on both hands, but is due to blue icing on a cake. Musculoskeletal: no muscle wasting or atrophy  Neurologic: A&O X 3; Appropriate Affect ; SENSATION: normal; MOTOR FUNCTION:  moving all extremities equally. Speech is fluent/normal Psychiatric:  Normal and capable of medical decision making Lymphatic: no palpable LAD   CBC    Component Value Date/Time   WBC 6.3 01/10/2015 0440   WBC 11.0* 08/25/2012 1033   RBC 3.17* 01/10/2015 0440   RBC 4.38 08/25/2012 1033   HGB 9.3* 01/10/2015 0440   HGB 12.0 08/25/2012 1033   HCT 27.1* 01/10/2015 0440   HCT 36.9 08/25/2012 1033   PLT 93* 01/10/2015 0440   PLT 154 08/25/2012 1033   MCV 85.5 01/10/2015 0440   MCV 84.2 08/25/2012 1033   MCH 29.3 01/10/2015 0440   MCH 27.4 08/25/2012 1033   MCHC 34.3 01/10/2015 0440   MCHC 32.5 08/25/2012 1033   RDW 18.5* 01/10/2015 0440   RDW 16.5* 08/25/2012 1033   LYMPHSABS 1.4 11/09/2014 0908   LYMPHSABS 1.8 08/25/2012 1033   MONOABS 0.4 11/09/2014 0908   MONOABS 0.9 08/25/2012 1033   EOSABS 0.3 11/09/2014 0908   EOSABS 0.3 08/25/2012 1033   BASOSABS 0.0 11/09/2014 0908   BASOSABS 0.1 08/25/2012 1033    BMET    Component Value Date/Time   NA 135 01/11/2015 0716   NA 141 08/25/2012 1033   K 4.0 01/11/2015 0716   K 4.3 08/25/2012 1033   CL 99 01/11/2015 0716   CL 108* 08/25/2012 1033   CO2 21 01/11/2015 0716   CO2 19* 08/25/2012 1033   GLUCOSE 135* 01/11/2015 0716   GLUCOSE 77 08/25/2012 1033   BUN 70* 01/11/2015 0716   BUN 68.0*  08/25/2012 1033   CREATININE 7.26* 01/11/2015 0716   CREATININE 2.61* 11/09/2014 0908   CREATININE 2.8* 05/10/2014   CREATININE 2.9* 08/25/2012 1033   CALCIUM 8.5 01/11/2015 0716   CALCIUM 9.5 08/25/2012 1033   CALCIUM 9.9 03/29/2010 0000   GFRNONAA 5* 01/11/2015 0716   GFRNONAA 24* 08/23/2011 1059   GFRAA 6* 01/11/2015 0716   GFRAA 30* 08/23/2011 1059    COAGS: Lab Results  Component Value Date   INR 1.02 10/23/2011   INR 1.22 05/09/2010   INR 1.0 06/23/2007     Non-Invasive Vascular Imaging:   none  Statin:  Yes.   Beta Blocker:  Yes.   Aspirin:  Yes.   ACEI:  No. ARB:  No. Other antiplatelets/anticoagulants:  Yes.   SQ heparin   ASSESSMENT/PLAN: This is a 74 y.o. female with ARF on CKD IV who is in need of a diatek catheter and permanent HD access   -will place diatek catheter today -hold afternoon dose of heparin and resume at next dose tonight at 2200 (order placed) -will place pre-op orders   Leontine Locket, PA-C [facility] [phone]  Addendum  I have independently interviewed and examined the patient,  and I agree with the physician assistant's findings.  Palpable thrill in L RC AVF with echymotic L forearm.  Two issues:  1. ESRD requiring HD: will need TDC placement to facilitate HD.  Pt already schedule for this afternoon.  The patient is aware the risks of tunneled dialysis catheter placement include but are not limited to: bleeding, infection, central venous injury, pneumothorax, possible venous stenosis, possible malpositioning in the venous system, and possible infections related to long-term catheter presence. The patient was aware of these risks and agreed to proceed.  2. Difficulty cannulating L RC AVF: would not proceed with fistulogram in the acute phase, as images are likely to suggest hematoma compression of the fistula anyway without providing any meaningful diagnostic data about the left RC AVF.  Would let the patient heal  couple weeks and then plan on L arm fistulogram, possible intervention.  Adele Barthel, MD [facility] [place] Office: [phone] Pager: [phone]  01/12/2015, 1:15 PM

## 2015-01-12 NOTE — Progress Notes (Signed)
Dr. Mercy Moore in to see patient.  Will put in surgical consult for HD cath.  Keep pt NPO for now.  Pt nauseated paged family medicine for something for nausea.

## 2015-01-12 NOTE — Progress Notes (Signed)
Family Medicine Teaching Service Daily Progress Note Intern Pager: [phone]  Patient name: Kristi [name] Medical record number: [id number] Date of birth: February 26, 1941 Age: 74 y.o. Gender: female  Primary Care Provider: Alease Frame Marylynn Pearson, MD Consultants: CCM, Renal, Cardiology Code Status: Full  Pt Overview and Major Events to Date:  - 2/1: Admitted to ICU - 2/3: Out of ICU. Care transferred to [facility] Medicine Teaching Service  Assessment and Plan: 74yo female with acute renal failure admitted for emergent HD and for symptomatic bradycardia. 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 Going for dialysis today - Creatinine 7.26 yesterday, improved from 11.8 at admission.  Will recheck after HD - Renal US- no evidence of hydronephrosis - Renal consulted. Appreciate recommendations.  # Bradycardia- suspected to be caused by accumulation of outpatient metoprolol as well as renal failure with acidosis and hyperkalemia - Holding home amlodipine, lasix, toprol XL, nitro, and pravastatin - Improved HR 95 - Cardiology consulted. Appreciate recommendations.  # UTI - Urinalysis moderate leukocytes, large hemoglobin, many bacteria - Blood clots noted in urine. Suspect this is secondary to catheter. If continues to have blood clots, will further evaluate - Urine Culture--Enterobacter cloacae, resistant to cefazolin, ceftriaxone, pip/tazo - Keflex 250 q6hr (2/3>>2/4) - Ciprofloxacin 500mg  q24 (2/4>>) (Day #2 of abx)  # H/O Diastolic Heart Failure- last echo 10/29/14 of EF 75-64%, grade 3 diastolic dysfunction, moderate LVH, PAP 96.  - BNP- 2273.8, unreliable due to ESRD - Weight 207 today, up from 195 yesterday. Suspect different scales are being used due to placement on different floor - Currently on 3L [place] and satting 90% -  Echo- mild LV hypertrophy with EF 55-60%, moderate diastolic dysfunction, RV pressure/volume overload, moderately to severely dilated RV with mildly decreased systolic function, severe pulmonary HTN - Cardiology consulted. Appreciate recommendations.  # COPD - Continue Duonebs, Brovana, Budesonide - Holding Spiriva and Dulera  # Diabetes - Moderate Sliding Scale with HS Coverage - [place] glipizide, lantus  # GERD - Continue Pantoprazole  FEN/GI: Renal Diet PPx: SQ Heparin  Disposition: Discharge home pending improvement in renal function and respiratory status.  Subjective:  No acute complaints overnight. Complains of nausea today and burning with urination. Notes no improvement in respiratory status. States she went to dialysis today but they were unable to obtain access, so she is being evaluated for catheter placement.  Initially presented on 2/1 with generalized weakness and shortness of breath. Was found to have elevated Creatinine to 11.8, hyperkalemia to 5.6, anion gap metabolic acidosis, and symptomatic bradycardia. Was admitted to ICU.  Objective: Temp:  [98.1 F (36.7 C)-98.8 F (37.1 C)] 98.8 F (37.1 C) (02/04 0529) Pulse Rate:  [88-121] 95 (02/04 0745) Resp:  [20] 20 (02/04 0745) BP: (98-136)/(40-61) 136/61 mmHg (02/04 0745) SpO2:  [92 %-96 %] 92 % (02/04 0745) Weight:  [208 lb 12.8 oz (94.711 kg)] 208 lb 12.8 oz (94.711 kg) (02/04 0500) Physical Exam: General: 74yo female resting comfortably in no apparent distress Cardiovascular: S1 and S2 noted. No murmurs noted. Regular rate and rhythm Respiratory: Clear to auscultation bilaterally. No wheezes noted. [place] in place. Abdomen: Bowel sounds noted. Soft and nondistended. No tenderness to palpation. No suprapubic tenderness. Extremities: No edema noted.  Laboratory:  Recent Labs Lab 01/09/15 1601 01/09/15 1611 01/09/15 2310 01/10/15 0440  WBC 6.6  --  9.0  6.3  HGB 10.2* 11.9* 10.1* 9.3*  HCT 31.1* 35.0*  30.0* 27.1*  PLT 200  --  114* 93*    Recent Labs Lab 01/09/15 1601 01/09/15 1611 01/09/15 1917 01/10/15 0440 01/11/15 0716  NA 132* 131*  --  136 135  K 5.6* 5.5*  --  4.2 4.0  CL 98 101  --  99 99  CO2 11*  --   --  17* 21  BUN 151* 131*  --  102* 70*  CREATININE 11.66* 11.80* 11.37* 9.17* 7.26*  CALCIUM 8.4  --   --  8.3* 8.5  GLUCOSE 214* 202*  --  94 135*   Iron/TIBC/Ferritin/ %Sat    Component Value Date/Time   IRON 27* 01/10/2015 0741   TIBC 322 01/10/2015 0741   FERRITIN 37 01/10/2015 0741   IRONPCTSAT 8* 01/10/2015 0741   Urinalysis    Component Value Date/Time   COLORURINE YELLOW 01/10/2015 0116   APPEARANCEUR TURBID* 01/10/2015 0116   LABSPEC 1.025 01/10/2015 0116   PHURINE 7.0 01/10/2015 0116   GLUCOSEU NEGATIVE 01/10/2015 0116   HGBUR LARGE* 01/10/2015 0116   HGBUR trace-lysed 03/08/2010 1328   BILIRUBINUR NEGATIVE 01/10/2015 0116   KETONESUR NEGATIVE 01/10/2015 0116   PROTEINUR 100* 01/10/2015 0116   UROBILINOGEN 1.0 01/10/2015 0116   NITRITE NEGATIVE 01/10/2015 0116   LEUKOCYTESUR MODERATE* 01/10/2015 0116   - BNP 2273.8, unreliable due to CKD - Hepatitis B negative  Imaging/Diagnostic Tests: US Renal  01/09/2015   CLINICAL DATA:  Acute onset of renal failure.  Initial encounter.  EXAM: RENAL/URINARY TRACT ULTRASOUND COMPLETE  COMPARISON:  None.  FINDINGS: Right Kidney:  Length: 9.5 cm. Mildly increased parenchymal echogenicity. No mass or hydronephrosis visualized.  Left Kidney:  Length: 9.1 cm. Mildly increased parenchymal echogenicity. No mass or hydronephrosis visualized.  Bladder:  Appears normal for degree of bladder distention.  A small amount of ascites is noted about the liver and spleen.  IMPRESSION: 1. No evidence of hydronephrosis. 2. Mildly increased renal parenchymal echogenicity may reflect medical renal disease. 3. Small amount of ascites noted about the liver and spleen.   Electronically Signed   By: Garald Balding M.D.   On: 01/09/2015  18:57   Dg Chest Portable 1 View  01/09/2015   CLINICAL DATA:  Shortness of breath and wheezing for 2 days.  EXAM: PORTABLE CHEST - 1 VIEW  COMPARISON:  PA and lateral chest 10/30/2014 and 10/30/2012.  FINDINGS: There is marked cardiomegaly and vascular congestion. No consolidative process, pneumothorax or effusion is seen.  IMPRESSION: Cardiomegaly and pulmonary vascular congestion.   Electronically Signed   By: Inge Rise M.D.   On: 01/09/2015 16:26   Lorna Few, DO 01/12/2015, 8:17 AM PGY-1, [place] Intern pager: [phone], text pages welcome

## 2015-01-12 NOTE — Transfer of Care (Signed)
Immediate Anesthesia Transfer of Care Note  Patient: Kristi [name]  Procedure(s) Performed: Procedure(s): INSERTION OF DIALYSIS CATHETER (N/A)  Patient Location: PACU  Anesthesia Type:MAC  Level of Consciousness: awake, alert , oriented and sedated  Airway & Oxygen Therapy: Patient Spontanous Breathing and Patient connected to nasal cannula oxygen  Post-op Assessment: Report given to RN, Post -op Vital signs reviewed and stable and Patient moving all extremities  Post vital signs: Reviewed and stable  Last Vitals:  Filed Vitals:   01/12/15 1413  BP:   Pulse: 92  Temp:   Resp: 17    Complications: No apparent anesthesia complications

## 2015-01-12 NOTE — Progress Notes (Signed)
Pt projective vomiting in dialysis.  Dialysis stopped pt returned to room.  Tele  Notified.  Phenergan 12.5 mg po given for nausea.  Pt alert and oriented.  Pt states nausea not as bad at this time.

## 2015-01-12 NOTE — Progress Notes (Signed)
   Daily Progress Note  Keep NPO.  Will place [facility] once OR space is available.  Adele Barthel, MD [facility] [place] Office: [phone] Pager: [phone]  01/12/2015, 11:22 AM

## 2015-01-12 NOTE — H&P (View-Only) (Signed)
Hospital Consult    Reason for Consult:  Needs diatek  Referring Physician:  Mercy Moore MRN #:  [id number]  History of Present Illness: This is a 74 y.o. Kristi [name] who was admitted on 01/09/15 who has a hx of CKD IV.  She had progressive SOB, pedal edema and weakness all over.  In the ED, she was found to have pulmonary congestion/cardiomegaly and a creatinine of 11.8, hyperkalemia of 5.6 and anion gap metabolic acidosis.  She is s/p left RC AVF in 2012 with ligation of accessory branches.  She had a normal fistulogram in March 2013, which showed a widely patent fistula with no areas of significant stenosis within the fistula.  The radial artery was noted to be somewhat small.  The patient denies any steal sx. They started using her left RC AVF this past Monday and was successful Monday and Tuesday, however, when they tried to use it this morning, it infiltrated.  [facility] is consulted for diatek catheter placement.   Past Medical History  Diagnosis Date  . Hyperlipidemia   . Hyperparathyroidism   . CAD (coronary artery disease)   . Myocardial infarction 2008  . Hypertension   . COPD (chronic obstructive pulmonary disease)   . Diabetes mellitus     diagnosed 2010 - takes oral meds and lantus insulin  . Anemia   . Chronic kidney disease     esrd - since 2010  . GERD (gastroesophageal reflux disease)     on omeprazole  . Arthritis     all over  . CHF (congestive heart failure) 08-2011  . CVA 02/19/2010  . CAD 02/19/2010  . DEEP VENOUS THROMBOPHLEBITIS, LEG, LEFT 03/08/2010    in 2009 s/p hospitalization- only 1 isolated dvt  . Stroke ?2010    found remote CVA on CT scan  . Pulmonary artery hypertension 10/31/2014  . COPD exacerbation 10/31/2014    Past Surgical History  Procedure Laterality Date  . Av fistula placement  04/05/11    Left Radiocephalic AVF  . Appendectomy  1969  . Tubal ligation    . Revision of fisturl    . Coronary angioplasty with stent placement  06/23/2007    by Dr.  Irish Lack is her cardiologist  . Ligation goretex fistula  10/29/2011    Procedure: LIGATION GORE-TEX FISTULA;  Surgeon: Elam Dutch, MD;  Location: [facility] OR;  Service: Vascular;  Laterality: Left;  Ligation of competing branches left forearm fistula  . Fistulogram N/A 02/17/2012    Procedure: FISTULOGRAM;  Surgeon: Angelia Mould, MD;  Location: [facility] CATH LAB;  Service: Cardiovascular;  Laterality: N/A;    Allergies  Allergen Reactions  . Losartan Other (See Comments)    Causes increase in creatinine and worsening CKD  . Varenicline Nausea Only and Other (See Comments)    Dizzy and GI symptoms     Prior to Admission medications   Medication Sig Start Date End Date Taking? Authorizing Provider  acetaminophen (TYLENOL) 500 MG tablet Take 1,000 mg by mouth every 6 (six) hours as needed.   Yes Historical Provider, MD  acetaminophen-codeine (TYLENOL #3) 300-30 MG per tablet 1 tablet every 6 (six) hours as needed for moderate pain.  01/31/14  Yes Historical Provider, MD  albuterol (PROVENTIL HFA) 108 (90 BASE) MCG/ACT inhaler Inhale 2 puffs into the lungs every 4 (four) hours as needed. For wheezing 02/16/14  Yes Angelica Ran, MD  allopurinol (ZYLOPRIM) 100 MG tablet TAKE 2 TABLETS BY MOUTH EVERY DAY Patient taking differently: TAKE  3 TABLETS BY MOUTH EVERY DAY 12/12/14  Yes Elberta Leatherwood, MD  amLODipine (NORVASC) 5 MG tablet Take 1 tablet (5 mg total) by mouth daily. 09/16/14  Yes Elberta Leatherwood, MD  aspirin 325 MG tablet Take 325 mg by mouth at bedtime.    Yes Historical Provider, MD  [employer] ALLERGY RELIEF 180 MG tablet TAKE 1 TABLET BY MOUTH EVERY DAY Patient taking differently: TAKE 1 TABLET BY MOUTH as needed for allergies 02/14/13  Yes Angelica Ran, MD  furosemide (LASIX) Kristi MG tablet Take 1 tablet (Kristi mg total) by mouth daily. Patient taking differently: Take Kristi-160 mg by mouth 2 (two) times daily. Takes 160mg  in am and Kristi or 160mg depending in pm 11/02/14  Yes Crystal Libby Maw, MD    glipiZIDE (GLUCOTROL XL) 5 MG 24 hr tablet Take 1 tablet (5 mg total) by mouth daily with breakfast. Patient taking differently: Take 5 mg by mouth every evening.  09/16/14  Yes Elberta Leatherwood, MD  glucosamine-chondroitin 500-400 MG tablet Take 1 tablet by mouth 2 (two) times daily.     Yes Historical Provider, MD  HYDROcodone-acetaminophen (NORCO/VICODIN) 5-325 MG per tablet Take 1 tablet by mouth every 12 (twelve) hours as needed for moderate pain. 09/16/14  Yes Elberta Leatherwood, MD  insulin glargine (LANTUS) 100 UNIT/ML injection Inject 0.13 mLs (13 Units total) into the skin every morning. If blood sugar is >140 add 1 unit. If blood sugar is <140 subtract 1 unit. 03/18/14  Yes Angelica Ran, MD  metoprolol succinate (TOPROL-XL) 25 MG 24 hr tablet Take 0.5 tablets (12.5 mg total) by mouth daily. 09/16/14  Yes Elberta Leatherwood, MD  mometasone-formoterol (DULERA) 100-5 MCG/ACT AERO Inhale 2 puffs into the lungs 2 (two) times daily. Patient taking differently: Inhale 2 puffs into the lungs as needed for shortness of breath.  11/02/14  Yes Archie Patten, MD  nitroGLYCERIN (NITROSTAT) 0.4 MG SL tablet Place 0.4 mg under the tongue every 5 (five) minutes as needed. For chest pain     Call 911 if pain not relieved by 3rd tab   Yes Historical Provider, MD  NON FORMULARY Place 2 L into the nose continuous. 2 liters of o2   Yes Historical Provider, MD  Omega-3 Fatty Acids (FISH OIL) 1200 MG CAPS Take 2 capsules by mouth 2 (two) times daily.   Yes Historical Provider, MD  omeprazole (PRILOSEC) 20 MG capsule TAKE ONE CAPSULE BY MOUTH AT BEDTIME 04/27/14  Yes Angelica Ran, MD  oxyCODONE-acetaminophen (PERCOCET/ROXICET) 5-325 MG per tablet Take 1 tablet by mouth 3 (three) times daily as needed for severe pain.   Yes Historical Provider, MD  Polyethyl Glycol-Propyl Glycol (SYSTANE OP) Apply 1-2 drops to eye 4 (four) times daily as needed. For dry eyes   Yes Historical Provider, MD  pravastatin (PRAVACHOL) 40 MG  tablet Take 1 tablet (40 mg total) by mouth daily. Patient taking differently: Take 40 mg by mouth every evening.  09/16/14  Yes Elberta Leatherwood, MD  Tiotropium Bromide Monohydrate (SPIRIVA RESPIMAT) 2.5 MCG/ACT AERS Inhale 2 puffs once daily Patient taking differently: Inhale 2 puffs into the lungs as needed. Inhale 2 puffs once daily 03/24/14  Yes Zigmund Gottron, MD  vitamin B-12 (CYANOCOBALAMIN) 1000 MCG tablet Take 1,000 mcg by mouth every morning.    Yes Historical Provider, MD  benzonatate (TESSALON) 100 MG capsule Take 1 capsule (100 mg total) by mouth 3 (three) times daily as needed for cough. 11/02/14  Archie Patten, MD  doxycycline (VIBRA-TABS) 100 MG tablet Take 1 tablet (100 mg total) by mouth every 12 (twelve) hours. Starting tonight 11/02/14   Archie Patten, MD  glucose blood (ACCU-CHEK SMARTVIEW) test strip 1 each by Other route 2 (two) times daily. Use as instructed 12/19/14   Timmothy Euler, MD  Lancets (ACCU-CHEK SOFT TOUCH) lancets 1 each by Other route 2 (two) times daily. Use as instructed 12/19/14   Timmothy Euler, MD  predniSONE (DELTASONE) 50 MG tablet Take 1 tablet (50 mg total) by mouth daily with breakfast. 11/02/14   Archie Patten, MD    History   Social History  . Marital Status: Single    Spouse Name: N/A    Number of Children: 3  . Years of Education: N/A   Occupational History  . RetiredCatering manager Work    Social History Main Topics  . Smoking status: Former Smoker -- 0.50 packs/day for 53 years    Types: Cigarettes    Start date: 12/09/1960    Quit date: 03/31/2014  . Smokeless tobacco: Never Used  . Alcohol Use: No  . Drug Use: No  . Sexual Activity: Not on file   Other Topics Concern  . Not on file   Social History Narrative   Health Care POA: [place]   Emergency Contact: Suzan Slick    End of Life Plan:    Who lives with you: Lives by her self in 1 story home   Any pets:    Diet: Patient typically  eats frozen meal/sandwiches.  Has problems standing to cook.   Exercise: Patient does not have a regular exercise plan.   Seatbelts: Patient reports wearing seat belt when in vehicle.   Sun Exposure/Protection: Patient does not wear sun protection.   Hobbies: Crafts, knitting, crotcheting           Family History  Problem Relation Age of Onset  . Cancer Mother     multiple myloma  . Dementia Father   . Pneumonia Father   . Anuerysm Father     ROS: [x]  Positive   [ ]  Negative   [ ]  All sytems reviewed and are negative  Cardiovascular: []  chest pain/pressure []  palpitations []  SOB lying flat [x]  DOE []  pain in legs while walking []  pain in legs at rest []  pain in legs at night []  non-healing ulcers []  hx of DVT [x]  swelling in legs  Pulmonary: []  productive cough []  asthma/wheezing []  home O2  Neurologic: []  weakness in []  arms []  legs []  numbness in []  arms []  legs []  hx of CVA []  mini stroke [] difficulty speaking or slurred speech []  temporary loss of vision in one eye []  dizziness  Hematologic: []  hx of cancer []  bleeding problems []  problems with blood clotting easily  Endocrine:   []  diabetes []  thyroid disease  GI []  vomiting blood []  blood in stool [x]  recent constipation leading to diarrhea [x]  nausea/vomiting this am x 1  GU: [x]  CKD/renal failure [x]  HD (new)--[]  M/W/F or []  T/T/S []  burning with urination []  blood in urine  Psychiatric: [x]  anxiety/nervousness []  depression  Musculoskeletal: []  arthritis []  joint pain [x]  myalgias  Integumentary: []  rashes []  ulcers  Constitutional: []  fever []  chills [x]  malaise/fatigue   Physical Examination  Filed Vitals:   01/12/15 1013  BP: 101/45  Pulse: 70  Temp: 98.5 F (36.9 C)  Resp: 21   Body mass index is 40.78 kg/(m^2).  General:  WDWN obese Kristi [name] in NAD Gait: Not observed HENT: WNL, normocephalic Pulmonary: normal non-labored breathing, without Rales, rhonchi,   wheezing Cardiac: regular, without  Murmurs, rubs or gallops; without carotid bruits Abdomen: soft, NT/ND, no masses, obese Skin: without rashes, without ulcers  Vascular Exam/Pulses:  Right Left  Radial 2+ (normal) 2+ (normal)  DP 2+ (normal) 2+ (normal)  PT Unable to palpate  Unable to palpate    Extremities: without ischemic changes, without Gangrene , without cellulitis; without open wounds; she does have discoloration on the tips of her fingers on both hands, but is due to blue icing on a cake. Musculoskeletal: no muscle wasting or atrophy  Neurologic: A&O X 3; Appropriate Affect ; SENSATION: normal; MOTOR FUNCTION:  moving all extremities equally. Speech is fluent/normal Psychiatric:  Normal and capable of medical decision making Lymphatic: no palpable LAD   CBC    Component Value Date/Time   WBC 6.3 01/10/2015 0440   WBC 11.0* 08/25/2012 1033   RBC 3.17* 01/10/2015 0440   RBC 4.38 08/25/2012 1033   HGB 9.3* 01/10/2015 0440   HGB 12.0 08/25/2012 1033   HCT 27.1* 01/10/2015 0440   HCT 36.9 08/25/2012 1033   PLT 93* 01/10/2015 0440   PLT 154 08/25/2012 1033   MCV 85.5 01/10/2015 0440   MCV 84.2 08/25/2012 1033   MCH 29.3 01/10/2015 0440   MCH 27.4 08/25/2012 1033   MCHC 34.3 01/10/2015 0440   MCHC 32.5 08/25/2012 1033   RDW 18.5* 01/10/2015 0440   RDW 16.5* 08/25/2012 1033   LYMPHSABS 1.4 11/09/2014 0908   LYMPHSABS 1.8 08/25/2012 1033   MONOABS 0.4 11/09/2014 0908   MONOABS 0.9 08/25/2012 1033   EOSABS 0.3 11/09/2014 0908   EOSABS 0.3 08/25/2012 1033   BASOSABS 0.0 11/09/2014 0908   BASOSABS 0.1 08/25/2012 1033    BMET    Component Value Date/Time   NA 135 01/11/2015 0716   NA 141 08/25/2012 1033   K 4.0 01/11/2015 0716   K 4.3 08/25/2012 1033   CL 99 01/11/2015 0716   CL 108* 08/25/2012 1033   CO2 21 01/11/2015 0716   CO2 19* 08/25/2012 1033   GLUCOSE 135* 01/11/2015 0716   GLUCOSE 77 08/25/2012 1033   BUN 70* 01/11/2015 0716   BUN 68.0*  08/25/2012 1033   CREATININE 7.26* 01/11/2015 0716   CREATININE 2.61* 11/09/2014 0908   CREATININE 2.8* 05/10/2014   CREATININE 2.9* 08/25/2012 1033   CALCIUM 8.5 01/11/2015 0716   CALCIUM 9.5 08/25/2012 1033   CALCIUM 9.9 03/29/2010 0000   GFRNONAA 5* 01/11/2015 0716   GFRNONAA 24* 08/23/2011 1059   GFRAA 6* 01/11/2015 0716   GFRAA 30* 08/23/2011 1059    COAGS: Lab Results  Component Value Date   INR 1.02 10/23/2011   INR 1.22 05/09/2010   INR 1.0 06/23/2007     Non-Invasive Vascular Imaging:   none  Statin:  Yes.   Beta Blocker:  Yes.   Aspirin:  Yes.   ACEI:  No. ARB:  No. Other antiplatelets/anticoagulants:  Yes.   SQ heparin   ASSESSMENT/PLAN: This is a 74 y.o. Kristi [name] with ARF on CKD IV who is in need of a diatek catheter and permanent HD access   -will place diatek catheter today -hold afternoon dose of heparin and resume at next dose tonight at 2200 (order placed) -will place pre-op orders   Leontine Locket, PA-C [facility] [phone]  Addendum  I have independently interviewed and examined the patient,  and I agree with the physician assistant's findings.  Palpable thrill in L RC AVF with echymotic L forearm.  Two issues:  1. ESRD requiring HD: will need TDC placement to facilitate HD.  Pt already schedule for this afternoon.  The patient is aware the risks of tunneled dialysis catheter placement include but are not limited to: bleeding, infection, central venous injury, pneumothorax, possible venous stenosis, possible malpositioning in the venous system, and possible infections related to long-term catheter presence. The patient was aware of these risks and agreed to proceed.  2. Difficulty cannulating L RC AVF: would not proceed with fistulogram in the acute phase, as images are likely to suggest hematoma compression of the fistula anyway without providing any meaningful diagnostic data about the left RC AVF.  Would let the patient heal  couple weeks and then plan on L arm fistulogram, possible intervention.  Adele Barthel, MD [facility] [place] Office: [phone] Pager: [phone]  01/12/2015, 1:15 PM

## 2015-01-12 NOTE — Progress Notes (Signed)
Pt nauseated vomited x 1 contents of dinner last night, collard greens.

## 2015-01-12 NOTE — Progress Notes (Signed)
Call to Dr. Marcie Bal, we will omit EKG order for now. Last ekg was on 01/09/2015.

## 2015-01-12 NOTE — Op Note (Signed)
    OPERATIVE NOTE  PROCEDURE: 1. Right internal jugular vein tunneled dialysis catheter placement 2. Right internal jugular vein cannulation under ultrasound guidance  PRE-OPERATIVE DIAGNOSIS: end-stage renal failure  POST-OPERATIVE DIAGNOSIS: same as above  SURGEON: Adele Barthel, MD  ANESTHESIA: local and MAC  ESTIMATED BLOOD LOSS: 30 cc  FINDING(S): 1.  Tips of the catheter in the right atrium on fluoroscopy 2.  No obvious pneumothorax on fluoroscopy  SPECIMEN(S):  none  INDICATIONS:   Kristi [name] is a 74 y.o. female who presents with end stage renal disease.  The patient presents for tunneled dialysis catheter placement.  The patient is aware the risks of tunneled dialysis catheter placement include but are not limited to: bleeding, infection, central venous injury, pneumothorax, possible venous stenosis, possible malpositioning in the venous system, and possible infections related to long-term catheter presence.  The patient was aware of these risks and agreed to proceed.  DESCRIPTION: After written full informed consent was obtained from the patient, the patient was taken back to the operating room.  Prior to induction, the patient was given IV antibiotics.  After obtaining adequate sedation, the patient was prepped and draped in the standard fashion for a chest or neck tunneled dialysis catheter placement.   The cannulation site, the catheter exit site, and tract for the subcutaneous tunnel were then anesthestized with a total of 30 cc of a 1:1 mixture of 0.5% Marcaine without epinepherine and 1% Lidocaine with epinepherine.  Under ultrasound guidance, the right internal jugular vein was cannulated with the 18 gauge needle.  A J-wire was then placed down into the inferior vena cava under fluoroscopic guidance.  The wire was then secured in place with a clamp to the drapes.  I then made stab incisions at the neck and exit sites.   I dissected from the exit site to the cannulation  site with a metal tunneler.   The subcutaneous tunnel was dilated by passing a plastic dilator over the metal dissector. The wire was then unclamped and I removed the needle.  The skin tract and venotomy was dilated serially with dilators.  Finally, the dilator-sheath was placed under fluoroscopic guidance into the superior vena cava.  The dilator and wire were removed.  A 23 cm Diatek catheter was placed under fluoroscopic guidance down into the right atrium.  The sheath was broken and peeled away while holding the catheter cuff at the level of the skin.  The back end of this catheter was transected, revealing the two lumens of this catheter.  The ports were docked onto these two lumens.  The catheter hub was then screwed into place.  Each port was tested by aspirating and flushing.  No resistance was noted.  Each port was then thoroughly flushed with heparinized saline.  The catheter was secured in placed with two interrupted stitches of 3-0 Nylon tied to the catheter.  The neck incision was closed with a U-stitch of 4-0 Monocryl.  The neck and chest incision were cleaned and sterile bandages applied.  Each port was then loaded with concentrated heparin (1000 Units/mL) at the manufacturer recommended volumes to each port.  Sterile caps were applied to each port.  On completion fluoroscopy, the tips of the catheter were in the right atrium, and there was no evidence of pneumothorax.  COMPLICATIONS: none  CONDITION: stable   Adele Barthel, MD [facility] [place] Office: [phone] Pager: [phone]  01/12/2015, 2:05 PM

## 2015-01-13 ENCOUNTER — Encounter (HOSPITAL_COMMUNITY): Payer: Self-pay | Admitting: Cardiology

## 2015-01-13 DIAGNOSIS — I480 Paroxysmal atrial fibrillation: Secondary | ICD-10-CM

## 2015-01-13 DIAGNOSIS — I4891 Unspecified atrial fibrillation: Secondary | ICD-10-CM

## 2015-01-13 LAB — HEPATIC FUNCTION PANEL
ALBUMIN: 2.7 g/dL — AB (ref 3.5–5.2)
ALK PHOS: 73 U/L (ref 39–117)
ALT: 7 U/L (ref 0–35)
AST: 19 U/L (ref 0–37)
BILIRUBIN DIRECT: 0.1 mg/dL (ref 0.0–0.5)
Indirect Bilirubin: 0.8 mg/dL (ref 0.3–0.9)
TOTAL PROTEIN: 6.1 g/dL (ref 6.0–8.3)
Total Bilirubin: 0.9 mg/dL (ref 0.3–1.2)

## 2015-01-13 LAB — LIPASE, BLOOD: Lipase: 41 U/L (ref 11–59)

## 2015-01-13 LAB — BASIC METABOLIC PANEL
Anion gap: 13 (ref 5–15)
BUN: 61 mg/dL — ABNORMAL HIGH (ref 6–23)
CALCIUM: 8.8 mg/dL (ref 8.4–10.5)
CHLORIDE: 100 mmol/L (ref 96–112)
CO2: 25 mmol/L (ref 19–32)
Creatinine, Ser: 6.2 mg/dL — ABNORMAL HIGH (ref 0.50–1.10)
GFR calc Af Amer: 7 mL/min — ABNORMAL LOW (ref >90)
GFR, EST NON AFRICAN AMERICAN: 6 mL/min — AB (ref >90)
GLUCOSE: 168 mg/dL — AB (ref 70–99)
Potassium: 3.6 mmol/L (ref 3.5–5.1)
Sodium: 138 mmol/L (ref 135–145)

## 2015-01-13 LAB — CBC
HCT: 26 % — ABNORMAL LOW (ref 36.0–46.0)
HEMOGLOBIN: 8.2 g/dL — AB (ref 12.0–15.0)
MCH: 28.9 pg (ref 26.0–34.0)
MCHC: 31.5 g/dL (ref 30.0–36.0)
MCV: 91.5 fL (ref 78.0–100.0)
PLATELETS: 42 10*3/uL — AB (ref 150–400)
RBC: 2.84 MIL/uL — ABNORMAL LOW (ref 3.87–5.11)
RDW: 18.9 % — ABNORMAL HIGH (ref 11.5–15.5)
WBC: 6.2 10*3/uL (ref 4.0–10.5)

## 2015-01-13 LAB — GLUCOSE, CAPILLARY
GLUCOSE-CAPILLARY: 153 mg/dL — AB (ref 70–99)
GLUCOSE-CAPILLARY: 160 mg/dL — AB (ref 70–99)
GLUCOSE-CAPILLARY: 140 mg/dL — AB (ref 70–99)
Glucose-Capillary: 150 mg/dL — ABNORMAL HIGH (ref 70–99)

## 2015-01-13 MED ORDER — PROMETHAZINE HCL 25 MG PO TABS
25.0000 mg | ORAL_TABLET | Freq: Three times a day (TID) | ORAL | Status: DC | PRN
  Administered 2015-01-13 (×2): 25 mg via ORAL
  Filled 2015-01-13 (×2): qty 1

## 2015-01-13 MED ORDER — ONDANSETRON HCL 4 MG PO TABS
4.0000 mg | ORAL_TABLET | Freq: Three times a day (TID) | ORAL | Status: DC | PRN
  Filled 2015-01-13: qty 1

## 2015-01-13 MED ORDER — DARBEPOETIN ALFA 40 MCG/0.4ML IJ SOSY
40.0000 ug | PREFILLED_SYRINGE | INTRAMUSCULAR | Status: DC
  Administered 2015-01-14: 40 ug via INTRAVENOUS
  Filled 2015-01-13: qty 0.4

## 2015-01-13 MED ORDER — ATORVASTATIN CALCIUM 80 MG PO TABS
80.0000 mg | ORAL_TABLET | Freq: Every day | ORAL | Status: DC
Start: 2015-01-13 — End: 2015-01-16
  Administered 2015-01-13 – 2015-01-15 (×2): 80 mg via ORAL
  Filled 2015-01-13 (×4): qty 1

## 2015-01-13 NOTE — Consult Note (Signed)
    Subjective:  74 year old female admitted with complaints of progressive dyspnea, fatigue and malaise. Found to have acute on chronic renal failure requiring dialysis. Patient initially noted to have severe bradycardia felt secondary to renal failure, hyperkalemia and accumulation of Lopressor. This has improved. She has been noted to have intermittent atrial fibrillation. Cardiology asked to evaluate.  Denies CP or dyspnea; no palpitations   Objective:  Filed Vitals:   01/12/15 2039 01/13/15 0414 01/13/15 0833 01/13/15 1005  BP:  118/55  115/47  Pulse:  64  83  Temp:  98.9 F (37.2 C)  98.6 F (37 C)  TempSrc:  Oral  Oral  Resp:    20  Height:      Weight:  192 lb 3.9 oz (87.2 kg)    SpO2: 95% 98% 98% 95%    Intake/Output from previous day:  Intake/Output Summary (Last 24 hours) at 01/13/15 1339 Last data filed at 01/13/15 1100  Gross per 24 hour  Intake    150 ml  Output   1220 ml  Net  -1070 ml    Physical Exam: Physical exam: Well-developed well-nourished in no acute distress.  Skin is warm and dry.  HEENT is normal.  Neck is supple.  Chest-diminished BS bases Cardiovascular exam is regular rate and rhythm.  Abdominal exam nontender or distended. No masses palpated. Extremities show no edema. neuro grossly intact    Lab Results: Basic Metabolic Panel:  Recent Labs  01/11/15 0716 01/12/15 1200 01/13/15 0538  NA 135 135 138  K 4.0 3.6 3.6  CL 99 98 100  CO2 21 23 25   GLUCOSE 135* 168* 168*  BUN 70* 80* 61*  CREATININE 7.26* 7.76* 6.20*  CALCIUM 8.5 8.9 8.8  MG 2.3  --   --   PHOS 8.3*  --   --    CBC:  Recent Labs  01/13/15 0538  WBC 6.2  HGB 8.2*  HCT 26.0*  MCV 91.5  PLT 42*     Assessment/Plan:  1 paroxysmal atrial fibrillation-I have reviewed all of her telemetry strips over the last 24 hours and also strips in her chart. Difficult to discern but many strips show sinus with PACs. There are strips in the chart that suggests  intermittent atrial fibrillation. Echocardiogram shows normal LV function. There was also right side enlargement and pulmonary hypertension most likely from pulmonary venous hypertension. Check TSH. Multiple embolic risk factors and her Chadsvasc is 8. Will need long-term anticoagulation with Coumadin. However she has developed progressive thrombocytopenia and platelet count is now 42,000. I will not add any anticoagulation at this point until it improves. At this point her heart rate is controlled on no AV nodal blocking agents and she was bradycardic at the time of admission which was exacerbated by hyperkalemia. Follow heart rate and add low-dose beta-blockade if needed. 2 history of coronary artery disease-we will discontinue aspirin at discharge given addition of Coumadin. Resume statin. 3 end-stage renal disease-dialysis per nephrology. 4 thrombocytopenia-further evaluation per primary care.  We will follow  Kirk Ruths 01/13/2015, 1:39 PM

## 2015-01-13 NOTE — Progress Notes (Signed)
Family Medicine Teaching Service Daily Progress Note Intern Pager: [phone]  Patient name: Kristi [name] Medical record number: [id number] Date of birth: 06-Mar-1941 Age: 74 y.o. Gender: female  Primary Care Provider: Alease Frame Marylynn Pearson, MD Consultants: CCM, Renal, Cardiology Code Status: Full  Assessment and Plan: 74yo female with acute renal failure admitted for emergent HD and for symptomatic bradycardia. PMH includes hyperlipidemia, hyperparathyroidism, CAD, MI (2008), HTN, COPD, DM, Anemia, CKD, GERD, Arthritis, CHF, CVA, DVT, and PAH  # Atrial Fibrillation - Noted on EKG at admission to ICU and at admission today - Not anticoagulated. Will hold off on anticoagulation at this time due to low platelets.  - Follows with Cardiology as outpatient. Will consult today and appreciate recommendations.  # Thrombocytopenia- Platelets 114 at admission - Platelets 42 - Follow up HIT panel - Heparin discontinued. SCDs placed.  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   - Renal US- no evidence of hydronephrosis - Renal consulted. Appreciate recommendations.  # Nausea/Vomiting - Nephrology does not feel this is renal related - QTc prolonged on previous EKG. Normal QT today. Will obtain daily EKGs to monitor and discontinue if needed. - Follow up Lipase and Hepatic Panel - Zofran 4mg  q8hr PRN  # Anemia- Hemoglobin 10.2 at admission - Hemoglobin 8.2 today - Continue to monitor - Consider transfusion if <8  # UTI - Urinalysis moderate leukocytes, large hemoglobin, many bacteria - Blood clots noted in urine. Suspect this is secondary to catheter. If continues to have blood clots, will further evaluate - Urine Culture--Enterobacter cloacae, resistant to cefazolin, ceftriaxone, pip/tazo - Keflex 250 q6hr (2/3>>2/4) - Ciprofloxacin 500mg  q24 (2/4>>) (Day #3 of abx)  - Monitor EKGs closely. Prolonged  QT. Abx options limited by sensitivities.  # Pulmonary HTN - Currently on 3L [place] and satting 95%. Was on 2L [place] prior to hospitalization - Denies shortness of breath on 3L  # H/O Diastolic Heart Failure- last echo 10/29/14 of EF 32-20%, grade 3 diastolic dysfunction, moderate LVH, PAP 96.  - BNP- 2273.8, unreliable due to ESRD - Weight 192 today, up from 195 yesterday. Suspect different scales are being used due to placement on different floor - Currently on 3L [place] and satting 90% - Echo- mild LV hypertrophy with EF 55-60%, moderate diastolic dysfunction, RV pressure/volume overload, moderately to severely dilated RV with mildly decreased systolic function, severe pulmonary HTN - Cardiology consulted. Appreciate recommendations.  FEN/GI: Renal Diet PPx: SQ Heparin  Disposition: Discharge home pending improvement in renal function and respiratory status.  Subjective:  No acute complaints overnight. Continues to complain of nausea. Continues to note burning with urination, but states this has improved since initiation of antibiotics. Denies shortness of breath on 3L [place]. Was on 2L [place] at home prior to hospitalization.  Initially presented on 2/1 with generalized weakness and shortness of breath. Was found to have elevated Creatinine to 11.8, hyperkalemia to 5.6, anion gap metabolic acidosis, and symptomatic bradycardia. Was admitted to ICU.  Objective: Temp:  [97.5 F (36.4 C)-98.9 F (37.2 C)] 98.9 F (37.2 C) (02/05 0414) Pulse Rate:  [60-92] 64 (02/05 0414) Resp:  [15-23] 18 (02/04 1853) BP: (99-153)/(41-87) 118/55 mmHg (02/05 0414) SpO2:  [90 %-98 %] 98 % (02/05 0833) Weight:  [191 lb 12.8 oz (87 kg)-192 lb 14.4 oz (87.5 kg)] 192 lb 3.9 oz (87.2 kg) (02/05 0414) Physical Exam: General: 74yo female resting comfortably in no  apparent distress Cardiovascular: S1 and S2 noted. No murmurs noted. Regular rate and rhythm Respiratory: Clear to auscultation bilaterally. No wheezes noted. [place]  in place. Abdomen: Bowel sounds noted. Soft and nondistended. No tenderness to palpation. No suprapubic tenderness. Extremities: No edema noted.  Laboratory:  Recent Labs Lab 01/09/15 2310 01/10/15 0440 01/13/15 0538  WBC 9.0 6.3 6.2  HGB 10.1* 9.3* 8.2*  HCT 30.0* 27.1* 26.0*  PLT 114* 93* 42*    Recent Labs Lab 01/11/15 0716 01/12/15 1200 01/13/15 0538  NA 135 135 138  K 4.0 3.6 3.6  CL 99 98 100  CO2 21 23 25   BUN 70* 80* 61*  CREATININE 7.26* 7.76* 6.20*  CALCIUM 8.5 8.9 8.8  GLUCOSE 135* 168* 168*   Iron/TIBC/Ferritin/ %Sat    Component Value Date/Time   IRON 27* 01/10/2015 0741   TIBC 322 01/10/2015 0741   FERRITIN 37 01/10/2015 0741   IRONPCTSAT 8* 01/10/2015 0741   Urinalysis    Component Value Date/Time   COLORURINE YELLOW 01/10/2015 0116   APPEARANCEUR TURBID* 01/10/2015 0116   LABSPEC 1.025 01/10/2015 0116   PHURINE 7.0 01/10/2015 0116   GLUCOSEU NEGATIVE 01/10/2015 0116   HGBUR LARGE* 01/10/2015 0116   HGBUR trace-lysed 03/08/2010 1328   BILIRUBINUR NEGATIVE 01/10/2015 0116   KETONESUR NEGATIVE 01/10/2015 0116   PROTEINUR 100* 01/10/2015 0116   UROBILINOGEN 1.0 01/10/2015 0116   NITRITE NEGATIVE 01/10/2015 0116   LEUKOCYTESUR MODERATE* 01/10/2015 0116  - BNP 2273.8, unreliable due to CKD - Hepatitis B negative  Imaging/Diagnostic Tests: US Renal  01/09/2015   CLINICAL DATA:  Acute onset of renal failure.  Initial encounter.  EXAM: RENAL/URINARY TRACT ULTRASOUND COMPLETE  COMPARISON:  None.  FINDINGS: Right Kidney:  Length: 9.5 cm. Mildly increased parenchymal echogenicity. No mass or hydronephrosis visualized.  Left Kidney:  Length: 9.1 cm. Mildly increased parenchymal echogenicity. No mass or hydronephrosis visualized.  Bladder:  Appears normal for degree of bladder distention.  A small amount of ascites is noted about the liver and spleen.  IMPRESSION: 1. No evidence of hydronephrosis. 2. Mildly increased renal parenchymal echogenicity may  reflect medical renal disease. 3. Small amount of ascites noted about the liver and spleen.   Electronically Signed   By: Garald Balding M.D.   On: 01/09/2015 18:57   Dg Chest Port 1 View  01/12/2015   CLINICAL DATA:  ESRD needing dialysis.  Dialysis catheter placement.  EXAM: PORTABLE CHEST - 1 VIEW  COMPARISON:  01/09/2015  FINDINGS: New split tip dialysis catheter from right IJ approach, tips in the right atrium.  Unchanged cardiomegaly and vascular pedicle widening. There is pulmonary venous congestion without overt edema. No effusion or pneumothorax.  IMPRESSION: 1. New right-sided dialysis catheter, tips in the right atrium. 2. Cardiomegaly and progressive venous congestion.   Electronically Signed   By: Jorje Guild M.D.   On: 01/12/2015 15:20   Dg Chest Portable 1 View  01/09/2015   CLINICAL DATA:  Shortness of breath and wheezing for 2 days.  EXAM: PORTABLE CHEST - 1 VIEW  COMPARISON:  PA and lateral chest 10/30/2014 and 10/30/2012.  FINDINGS: There is marked cardiomegaly and vascular congestion. No consolidative process, pneumothorax or effusion is seen.  IMPRESSION: Cardiomegaly and pulmonary vascular congestion.   Electronically Signed   By: Inge Rise M.D.   On: 01/09/2015 16:26   Dg Fluoro Guide Cv Line-no Report  01/12/2015   CLINICAL DATA:    FLOURO GUIDE CV LINE  Fluoroscopy was utilized by the  requesting physician.  No radiographic  interpretation.    [place], [place] 01/13/2015, 8:42 AM PGY-1, [place] Intern pager: [phone], text pages welcome

## 2015-01-13 NOTE — Progress Notes (Signed)
S:  + N/V O:BP 118/55 mmHg  Pulse 64  Temp(Src) 98.9 F (37.2 C) (Oral)  Resp 18  Ht 5' (1.524 m)  Wt 87.2 kg (192 lb 3.9 oz)  BMI 37.54 kg/m2  SpO2 98%  Intake/Output Summary (Last 24 hours) at 01/13/15 0949 Last data filed at 01/13/15 0815  Gross per 24 hour  Intake    150 ml  Output    845 ml  Net   -695 ml   Weight change: -7.211 kg (-15 lb 14.4 oz) OHY:WVPXT and alert  CVS:RRR Resp:Faint basilar crackles Abd:+ BS Mild diffuse abd tenderness Ext: no edema  Infiltration of AVF + bruit NEURO: Ox3 no asterixis, OX3 Rt IJ permcath   . arformoterol  15 mcg Nebulization BID  . aspirin  325 mg Oral Daily  . budesonide  0.5 mg Nebulization BID  . calcium acetate  667 mg Oral TID WC  . ciprofloxacin  400 mg Intravenous Q24H  . darbepoetin (ARANESP) injection - DIALYSIS  40 mcg Intravenous Q Thu-HD  . doxercalciferol  2 mcg Intravenous Q T,Th,Sa-HD  . ferric gluconate (FERRLECIT/NULECIT) IV  125 mg Intravenous Q T,Th,Sa-HD  . heparin  5,000 Units Subcutaneous 3 times per day  . insulin aspart  0-15 Units Subcutaneous TID WC  . insulin aspart  0-5 Units Subcutaneous QHS  . ipratropium-albuterol  3 mL Nebulization QID  . multivitamin  1 tablet Oral QHS  . pantoprazole  40 mg Oral Daily  . sodium chloride  3 mL Intravenous Q12H   Dg Chest Port 1 View  01/12/2015   CLINICAL DATA:  ESRD needing dialysis.  Dialysis catheter placement.  EXAM: PORTABLE CHEST - 1 VIEW  COMPARISON:  01/09/2015  FINDINGS: New split tip dialysis catheter from right IJ approach, tips in the right atrium.  Unchanged cardiomegaly and vascular pedicle widening. There is pulmonary venous congestion without overt edema. No effusion or pneumothorax.  IMPRESSION: 1. New right-sided dialysis catheter, tips in the right atrium. 2. Cardiomegaly and progressive venous congestion.   Electronically Signed   By: Jorje Guild M.D.   On: 01/12/2015 15:20   Dg Fluoro Guide Cv Line-no Report  01/12/2015   CLINICAL DATA:     FLOURO GUIDE CV LINE  Fluoroscopy was utilized by the requesting physician.  No radiographic  interpretation.    BMET    Component Value Date/Time   NA 138 01/13/2015 0538   NA 141 08/25/2012 1033   K 3.6 01/13/2015 0538   K 4.3 08/25/2012 1033   CL 100 01/13/2015 0538   CL 108* 08/25/2012 1033   CO2 25 01/13/2015 0538   CO2 19* 08/25/2012 1033   GLUCOSE 168* 01/13/2015 0538   GLUCOSE 77 08/25/2012 1033   BUN 61* 01/13/2015 0538   BUN 68.0* 08/25/2012 1033   CREATININE 6.20* 01/13/2015 0538   CREATININE 2.61* 11/09/2014 0908   CREATININE 2.8* 05/10/2014   CREATININE 2.9* 08/25/2012 1033   CALCIUM 8.8 01/13/2015 0538   CALCIUM 9.5 08/25/2012 1033   CALCIUM 9.9 03/29/2010 0000   GFRNONAA 6* 01/13/2015 0538   GFRNONAA 24* 08/23/2011 1059   GFRAA 7* 01/13/2015 0538   GFRAA 30* 08/23/2011 1059   CBC    Component Value Date/Time   WBC 6.2 01/13/2015 0538   WBC 11.0* 08/25/2012 1033   RBC 2.84* 01/13/2015 0538   RBC 4.38 08/25/2012 1033   HGB 8.2* 01/13/2015 0538   HGB 12.0 08/25/2012 1033   HCT 26.0* 01/13/2015 0538   HCT 36.9 08/25/2012  1033   PLT 42* 01/13/2015 0538   PLT 154 08/25/2012 1033   MCV 91.5 01/13/2015 0538   MCV 84.2 08/25/2012 1033   MCH 28.9 01/13/2015 0538   MCH 27.4 08/25/2012 1033   MCHC 31.5 01/13/2015 0538   MCHC 32.5 08/25/2012 1033   RDW 18.9* 01/13/2015 0538   RDW 16.5* 08/25/2012 1033   LYMPHSABS 1.4 11/09/2014 0908   LYMPHSABS 1.8 08/25/2012 1033   MONOABS 0.4 11/09/2014 0908   MONOABS 0.9 08/25/2012 1033   EOSABS 0.3 11/09/2014 0908   EOSABS 0.3 08/25/2012 1033   BASOSABS 0.0 11/09/2014 0908   BASOSABS 0.1 08/25/2012 1033     Assessment: 1. New ESRD 2. Pulm edema 3. Hyperkalemia, improved 4 Pulm HTN 5. Anemia and fe def, receiving IV iron 6. Enterobacter UTI  Plan: 1. Not convinced all her N/V is renal related 2. Check hepatic panel and lipase 3. HD in AM  Burak Zerbe T

## 2015-01-13 NOTE — Progress Notes (Signed)
Pt nauseated, unable to eat.  Paged family medicine not time for phenergan po, no new orders at this time.  Family medicine will follow up.

## 2015-01-13 NOTE — Progress Notes (Signed)
   Daily Progress Note  Flow rates ok.  No PTX. Site ok.    - will arrange outpatient elective L arm fistulogram in 2 weeks  Adele Barthel, MD [facility] Office: [phone] Pager: [phone]  01/13/2015, 7:19 AM

## 2015-01-13 NOTE — Progress Notes (Signed)
EKG done results called to [facility].

## 2015-01-13 NOTE — Consult Note (Signed)
Patient evaluated for [place] Management services. Came to bedside to speak with patient about [facility] Care Management. However, she was resting and patient's nurse ask that writer come back at later time as patient has not been feeling well and really needs her rest. Will come back at later time to attempt to engage for [place] Management services.  Marthenia Rolling, MSN- rRN, [facility] Liaison323 [phone]

## 2015-01-14 ENCOUNTER — Inpatient Hospital Stay (HOSPITAL_COMMUNITY): Payer: Commercial Managed Care - HMO

## 2015-01-14 DIAGNOSIS — N186 End stage renal disease: Secondary | ICD-10-CM

## 2015-01-14 DIAGNOSIS — Z992 Dependence on renal dialysis: Secondary | ICD-10-CM

## 2015-01-14 LAB — GLUCOSE, CAPILLARY
Glucose-Capillary: 138 mg/dL — ABNORMAL HIGH (ref 70–99)
Glucose-Capillary: 115 mg/dL — ABNORMAL HIGH (ref 70–99)
Glucose-Capillary: 135 mg/dL — ABNORMAL HIGH (ref 70–99)
Glucose-Capillary: 156 mg/dL — ABNORMAL HIGH (ref 70–99)

## 2015-01-14 LAB — PROTIME-INR
INR: 1.23 (ref 0.00–1.49)
PROTHROMBIN TIME: 15.6 s — AB (ref 11.6–15.2)

## 2015-01-14 LAB — CBC
HCT: 25.7 % — ABNORMAL LOW (ref 36.0–46.0)
HEMOGLOBIN: 8.2 g/dL — AB (ref 12.0–15.0)
MCH: 28.5 pg (ref 26.0–34.0)
MCHC: 31.9 g/dL (ref 30.0–36.0)
MCV: 89.2 fL (ref 78.0–100.0)
Platelets: 78 10*3/uL — ABNORMAL LOW (ref 150–400)
RBC: 2.88 MIL/uL — ABNORMAL LOW (ref 3.87–5.11)
RDW: 18.8 % — ABNORMAL HIGH (ref 11.5–15.5)
WBC: 7.2 10*3/uL (ref 4.0–10.5)

## 2015-01-14 LAB — HEPARIN INDUCED THROMBOCYTOPENIA PNL: HEPARIN INDUCED PLT AB: 0.22 {OD_unit} (ref 0.000–0.400)

## 2015-01-14 LAB — LACTIC ACID, PLASMA: Lactic Acid, Venous: 1.2 mmol/L (ref 0.5–2.0)

## 2015-01-14 LAB — TSH: TSH: 1.235 u[IU]/mL (ref 0.350–4.500)

## 2015-01-14 MED ORDER — DOXERCALCIFEROL 4 MCG/2ML IV SOLN
INTRAVENOUS | Status: AC
  Administered 2015-01-14: 2 ug via INTRAVENOUS
  Filled 2015-01-14: qty 2

## 2015-01-14 MED ORDER — METOPROLOL TARTRATE 12.5 MG HALF TABLET
12.5000 mg | ORAL_TABLET | Freq: Two times a day (BID) | ORAL | Status: DC
Start: 2015-01-14 — End: 2015-01-16
  Administered 2015-01-14 – 2015-01-15 (×3): 12.5 mg via ORAL
  Filled 2015-01-14 (×6): qty 1

## 2015-01-14 MED ORDER — ONDANSETRON HCL 4 MG/2ML IJ SOLN
4.0000 mg | Freq: Four times a day (QID) | INTRAMUSCULAR | Status: DC | PRN
  Administered 2015-01-14 – 2015-01-15 (×2): 4 mg via INTRAVENOUS
  Filled 2015-01-14 (×2): qty 2

## 2015-01-14 MED ORDER — WARFARIN - PHARMACIST DOSING INPATIENT: Freq: Every day | Status: DC

## 2015-01-14 MED ORDER — WARFARIN SODIUM 5 MG PO TABS
5.0000 mg | ORAL_TABLET | Freq: Once | ORAL | Status: AC
  Administered 2015-01-14: 5 mg via ORAL
  Filled 2015-01-14: qty 1

## 2015-01-14 MED ORDER — DARBEPOETIN ALFA 40 MCG/0.4ML IJ SOSY
PREFILLED_SYRINGE | INTRAMUSCULAR | Status: AC
  Administered 2015-01-14: 40 ug via INTRAVENOUS
  Filled 2015-01-14: qty 0.4

## 2015-01-14 MED ORDER — MORPHINE SULFATE 2 MG/ML IJ SOLN
1.0000 mg | INTRAMUSCULAR | Status: DC | PRN
  Administered 2015-01-14 – 2015-01-24 (×23): 1 mg via INTRAVENOUS
  Filled 2015-01-14 (×17): qty 1

## 2015-01-14 NOTE — Procedures (Signed)
Pt seen on HD.  Ap 170 Vp 240  BFR 400 but ordered at 300 so decreased. Still with nausea but no vomiting. Difficult to know if this all uremia, my suspicion is no.  Her abd seems a little distended.  I would recommend at least getting a flat plate of the abd.  Labs pending.

## 2015-01-14 NOTE — Progress Notes (Signed)
Family Practice Teaching Service Interval Progress Note  S: Checked on patient @ approx 2210 for follow-up on complaints earlier with abd pain, abd distention, nausea, poor PO. Had ordered abdominal X-ray earlier that showed massive gastric distention with partial likely gastric outlet obstruction. Discussed these results with patient. She stated that her symptoms were mostly persistent, some improved abdominal pain based on her position, only tolerated a few small bites of dinner, still distended. She agrees to trying NGT placement for stomach decompression overnight. Never had NGT before. Additionally, discussed possibility that this may be due to a mass or cancer. She understands, denies prior hx CA.  O: BP 104/52 mmHg  Pulse 100  Temp(Src) 99.4 F (37.4 C) (Oral)  Resp 20  Ht 5' (1.524 m)  Wt 188 lb 15 oz (85.7 kg)  BMI 36.90 kg/m2  SpO2 99% General: laying in bed, resting, mostly unchanged since this AM with abd discomfort / distention, NAD Abd: Unchanged - soft, mild-mod distended general abdomen with +hypoactive BS, mild tenderness generalized  Lactic acid 1.2 (previously 1.5 on admit)  A&P: Briefly, Kristi [name] is a 74yo female admitted for acute renal failure to ICU for emergent HD. Currently being treated additionally for new onset atrial fibrillation, UTI. Recent worsening abdominal pain, distention, persistent nausea over past 24-48 hours. # Abdominal Pain / Distention / Nausea - Abd X-ray obtained today showed massive distended stomach, likely d/t gastric outlet obstruction. Personally called Radiology reading room to discuss these results, thought to be likely duodenal location with possible ulceration/stricture as likely cause (noted some air in duodenal bulb, so only partial obstruction). Concern for possible malignancy as obstructing etiology - Proceed with NGT placement per RN to low-int suction for gastric decompression overnight - NPO o/n - Monitor progress  Nobie Putnam, DO Family Medicine PGY-2 Service Pager [phone]

## 2015-01-14 NOTE — Progress Notes (Signed)
NG tube in place to right nare and on LIWS. 250cc greenish brown fluid returned. Patient tolerated well. Will continue to monitor.

## 2015-01-14 NOTE — Progress Notes (Signed)
Family Medicine Teaching Service Daily Progress Note Intern Pager: [phone]  Patient name: Kristi [name] Medical record number: [id number] Date of birth: October 04, 1941 Age: 74 y.o. Gender: female  Primary Care Provider: Alease Frame Marylynn Pearson, MD Consultants: CCM, Renal, Cardiology Code Status: Full  Assessment and Plan: 74yo female with acute renal failure admitted for emergent HD and for symptomatic bradycardia. PMH includes hyperlipidemia, hyperparathyroidism, CAD, MI (2008), HTN, COPD, DM, Anemia, CKD, GERD, Arthritis, CHF, CVA, DVT, and PAH  # Acute on Chronic Kidney Disease, considered new ESRD - Followed by Renal - greatly appreciate recommendations - Received HD on 2/1, 2/2, 2/4, 2/6 - Creatinine 6.20, improved from 11.8 at admission.   - Renal US- no evidence of hydronephrosis  # Atrial Fibrillation, New onset, questionable if paroxysmal - Controlled - On admission with symptomatic bradycardia (resolved s/p atropine x 1). Dx AFib evidenced on initial EKGs. ECHO with normal LV fxn. 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 Rapid drop plt 200 to 114 within 7 hours of admission, did receive Heparin SQ (and note prior hospitalization >3 months ago with heparin SQ) may be pre-exposure. - Plt trended down to low 42 on 2/5 - Improved platelets today to 78 - F/u HIT panel (pending) - Hold all heparin SQ and requested no heparin HD - Continue  SCDs - Start Coumadin per pharm for AFib  # Abdominal Pain / Distention / Nausea, with improved vomiting - Stable - Unclear etiology, initially thought to be related to uremia, now s/p multiple HD sessions, this seems less likely. Currently with mild improvement w/o vomiting but persistent nausea - Lipase 41, LFTs normal - Follow up Lipase and Hepatic Panel - Zofran 4mg  q8hr PRN - Order Abd X-ray / KUB today  # Anemia- Hemoglobin 10.2 at admission - Hgb stable at 8.2 - Continue to monitor - Consider transfusion if <8  # UTI - Urinalysis moderate leukocytes, large hemoglobin, many bacteria - Blood clots noted in urine. Suspect this is secondary to catheter. If continues to have blood clots, will further evaluate - Urine Culture--Enterobacter cloacae, resistant to cefazolin, ceftriaxone, pip/tazo - Keflex 250 q6hr (2/3>>2/4) - Ciprofloxacin 500mg  q24 (2/4>>) (Day #4 of abx) (resolved prolonged QTc)  # Pulmonary HTN - Currently on 3L [place] and satting 95%. Was on 2L [place] prior to hospitalization - Denies shortness of breath on 3L  # H/O Diastolic Heart Failure- last echo 10/29/14 of EF 58-09%, grade 3 diastolic dysfunction, moderate LVH, PAP 96.  - BNP- 2273.8, unreliable due to ESRD - Weight 192 today, up from 195 yesterday. Suspect different scales are being used due to placement on different floor - Currently on 3L [place] and satting 90% - Echo- mild LV hypertrophy with EF 55-60%, moderate diastolic dysfunction, RV pressure/volume overload, moderately to severely dilated RV with mildly decreased systolic function, severe pulmonary HTN - Cardiology consulted. Appreciate recommendations.  # Prolonged QTc - Resolved - initially QTc 558 on 2/1 - Repeat EKG with QTc on 2/5 with 458  FEN/GI: Renal  Diet PPx: SQ Heparin  Disposition: Discharge home pending improvement in renal function and respiratory status.  Subjective:  No acute complaints overnight. Resting in bed, plans for HD today.  Persistent nausea (some improved) without vomiting. Denies further dysuria. Denies SOB, continues on 3L [place]. Does not feel like she is at baseline yet.  Objective: Temp:  [97.9 F (36.6 C)-98.6 F (37 C)] 98.6 F (37 C) (02/06 0736) Pulse Rate:  [74-89] 74 (02/06 0736) Resp:  [18-21] 21 (02/06 0736) BP: (112-127)/(43-53) 127/49 mmHg (02/06 0736) SpO2:  [94 %-99 %] 94 % (02/06 0736) Weight:  [190 lb 4.1 oz (86.3 kg)] 190 lb 4.1 oz (86.3 kg) (02/05 2119) Physical Exam: General: 75yo female resting, somewhat uncomfortable with nausea, NAD Cardiovascular: RRR, no murmurs Respiratory: CTAB. No wheezes or focal crackles. [place] in place. Abdomen: Soft, mild-mod distended general abdomen with +hypoactive BS, mild tenderness general (Lower > upper quads), no rebound no guarding.  Extremities: No edema noted.  Laboratory:  Recent Labs Lab 01/09/15 2310 01/10/15 0440 01/13/15 0538  WBC 9.0 6.3 6.2  HGB 10.1* 9.3* 8.2*  HCT 30.0* 27.1* 26.0*  PLT 114* 93* 42*    Recent Labs Lab 01/11/15 0716 01/12/15 1200 01/13/15 0538 01/13/15 1130  NA 135 135 138  --   K 4.0 3.6 3.6  --   CL 99 98 100  --   CO2 21 23 25   --   BUN 70* 80* 61*  --   CREATININE 7.26* 7.76* 6.20*  --   CALCIUM 8.5 8.9 8.8  --   PROT  --   --   --  6.1  BILITOT  --   --   --  0.9  ALKPHOS  --   --   --  73  ALT  --   --   --  7  AST  --   --   --  19  GLUCOSE 135* 168* 168*  --    Iron/TIBC/Ferritin/ %Sat    Component Value Date/Time   IRON 27* 01/10/2015 0741   TIBC 322 01/10/2015 0741   FERRITIN 37 01/10/2015 0741   IRONPCTSAT 8* 01/10/2015 0741   Urinalysis    Component Value Date/Time   COLORURINE YELLOW 01/10/2015 0116   APPEARANCEUR TURBID* 01/10/2015 0116   LABSPEC 1.025 01/10/2015 0116   PHURINE 7.0 01/10/2015 0116   GLUCOSEU NEGATIVE 01/10/2015 0116   HGBUR LARGE* 01/10/2015 0116   HGBUR trace-lysed 03/08/2010 1328   BILIRUBINUR NEGATIVE 01/10/2015 0116   KETONESUR NEGATIVE 01/10/2015 0116    PROTEINUR 100* 01/10/2015 0116   UROBILINOGEN 1.0 01/10/2015 0116   NITRITE NEGATIVE 01/10/2015 0116   LEUKOCYTESUR MODERATE* 01/10/2015 0116  - BNP 2273.8, unreliable due to CKD - Hepatitis B negative  Imaging/Diagnostic Tests: Dg Chest Port 1 View  01/12/2015   CLINICAL DATA:  ESRD needing dialysis.  Dialysis catheter placement.  EXAM: PORTABLE CHEST - 1 VIEW  COMPARISON:  01/09/2015  FINDINGS: New split tip dialysis catheter from right IJ approach, tips in the right atrium.  Unchanged cardiomegaly and vascular pedicle widening. There is pulmonary venous congestion without overt edema. No effusion or pneumothorax.  IMPRESSION: 1. New right-sided dialysis catheter, tips in the right atrium. 2. Cardiomegaly and progressive venous congestion.   Electronically Signed   By: Jorje Guild M.D.   On: 01/12/2015 15:20   Dg Fluoro Guide Cv Line-no Report  01/12/2015   CLINICAL DATA:    FLOURO GUIDE CV LINE  Fluoroscopy was utilized by the requesting  physician.  No radiographic  interpretation.    Nobie Putnam, DO 01/14/2015, 8:30 AM PGY-2, [place] Intern pager: [phone], text pages welcome

## 2015-01-14 NOTE — Progress Notes (Signed)
ANTICOAGULATION CONSULT NOTE - Initial Consult  Pharmacy Consult for warfarin Indication: atrial fibrillation  Allergies  Allergen Reactions  . Losartan Other (See Comments)    Causes increase in creatinine and worsening CKD  . Varenicline Nausea Only and Other (See Comments)    Dizzy and GI symptoms     Patient Measurements: Height: 5' (152.4 cm) Weight: 188 lb 15 oz (85.7 kg) IBW/kg (Calculated) : 45.5 Heparin Dosing Weight:   Vital Signs: Temp: 98.3 F (36.8 C) (02/06 1659) Temp Source: Oral (02/06 1659) BP: 100/35 mmHg (02/06 1659) Pulse Rate: 101 (02/06 1659)  Labs:  Recent Labs  01/12/15 1200 01/13/15 0538 01/14/15 0500 01/14/15 1600  HGB  --  8.2* 8.2*  --   HCT  --  26.0* 25.7*  --   PLT  --  42* 78*  --   LABPROT  --   --   --  15.6*  INR  --   --   --  1.23  CREATININE 7.76* 6.20*  --   --     Estimated Creatinine Clearance: 7.7 mL/min (by C-G formula based on Cr of 6.2).   Medical History: Past Medical History  Diagnosis Date  . Hyperlipidemia   . Hyperparathyroidism   . CAD (coronary artery disease)   . Myocardial infarction 2008  . Hypertension   . COPD (chronic obstructive pulmonary disease)   . Diabetes mellitus     diagnosed 2010 - takes oral meds and lantus insulin  . Anemia   . Chronic kidney disease     esrd - since 2010  . GERD (gastroesophageal reflux disease)     on omeprazole  . Arthritis     all over  . CHF (congestive heart failure) 08-2011  . CVA 02/19/2010  . CAD 02/19/2010  . DEEP VENOUS THROMBOPHLEBITIS, LEG, LEFT 03/08/2010    in 2009 s/p hospitalization- only 1 isolated dvt  . Pulmonary artery hypertension 10/31/2014    Medications:  Prescriptions prior to admission  Medication Sig Dispense Refill Last Dose  . acetaminophen (TYLENOL) 500 MG tablet Take 1,000 mg by mouth every 6 (six) hours as needed.   01/08/2015 at Unknown time  . acetaminophen-codeine (TYLENOL #3) 300-30 MG per tablet 1 tablet every 6 (six) hours  as needed for moderate pain.    Past Week at Unknown time  . albuterol (PROVENTIL HFA) 108 (90 BASE) MCG/ACT inhaler Inhale 2 puffs into the lungs every 4 (four) hours as needed. For wheezing 8.5 g 2 Past Week at Unknown time  . allopurinol (ZYLOPRIM) 100 MG tablet TAKE 2 TABLETS BY MOUTH EVERY DAY (Patient taking differently: TAKE 3 TABLETS BY MOUTH EVERY DAY) 60 tablet 3 01/09/2015 at Unknown time  . amLODipine (NORVASC) 5 MG tablet Take 1 tablet (5 mg total) by mouth daily. 90 tablet 3 01/09/2015 at Unknown time  . aspirin 325 MG tablet Take 325 mg by mouth at bedtime.    01/08/2015 at Unknown time  . [facility] ALLERGY RELIEF 180 MG tablet TAKE 1 TABLET BY MOUTH EVERY DAY (Patient taking differently: TAKE 1 TABLET BY MOUTH as needed for allergies) 90 tablet 3 Past Month at Unknown time  . furosemide (LASIX) 80 MG tablet Take 1 tablet (80 mg total) by mouth daily. (Patient taking differently: Take 80-160 mg by mouth 2 (two) times daily. Takes 160mg  in am and 80 or 160mg depending in pm) 30 tablet 0 01/09/2015 at Unknown time  . glipiZIDE (GLUCOTROL XL) 5 MG 24 hr tablet Take 1 tablet (  5 mg total) by mouth daily with breakfast. (Patient taking differently: Take 5 mg by mouth every evening. ) 30 tablet 11 01/08/2015 at Unknown time  . glucosamine-chondroitin 500-400 MG tablet Take 1 tablet by mouth 2 (two) times daily.     01/09/2015 at Unknown time  . HYDROcodone-acetaminophen (NORCO/VICODIN) 5-325 MG per tablet Take 1 tablet by mouth every 12 (twelve) hours as needed for moderate pain. 60 tablet 0 Past Week at Unknown time  . insulin glargine (LANTUS) 100 UNIT/ML injection Inject 0.13 mLs (13 Units total) into the skin every morning. If blood sugar is >140 add 1 unit. If blood sugar is <140 subtract 1 unit. 10 mL 2 01/09/2015 at Unknown time  . metoprolol succinate (TOPROL-XL) 25 MG 24 hr tablet Take 0.5 tablets (12.5 mg total) by mouth daily. 90 tablet 3 01/08/2015 at 2100  . mometasone-formoterol (DULERA) 100-5 MCG/ACT  AERO Inhale 2 puffs into the lungs 2 (two) times daily. (Patient taking differently: Inhale 2 puffs into the lungs as needed for shortness of breath. ) 13 g 1 01/09/2015 at Unknown time  . nitroGLYCERIN (NITROSTAT) 0.4 MG SL tablet Place 0.4 mg under the tongue every 5 (five) minutes as needed. For chest pain     Call 911 if pain not relieved by 3rd tab   not used  . NON FORMULARY Place 2 L into the nose continuous. 2 liters of o2   01/09/2015 at Unknown time  . Omega-3 Fatty Acids (FISH OIL) 1200 MG CAPS Take 2 capsules by mouth 2 (two) times daily.   01/09/2015 at Unknown time  . omeprazole (PRILOSEC) 20 MG capsule TAKE ONE CAPSULE BY MOUTH AT BEDTIME 90 capsule 3 01/08/2015 at Unknown time  . oxyCODONE-acetaminophen (PERCOCET/ROXICET) 5-325 MG per tablet Take 1 tablet by mouth 3 (three) times daily as needed for severe pain.   Past Week at Unknown time  . Polyethyl Glycol-Propyl Glycol (SYSTANE OP) Apply 1-2 drops to eye 4 (four) times daily as needed. For dry eyes   Past Week at Unknown time  . pravastatin (PRAVACHOL) 40 MG tablet Take 1 tablet (40 mg total) by mouth daily. (Patient taking differently: Take 40 mg by mouth every evening. ) 90 tablet 3 01/08/2015 at Unknown time  . Tiotropium Bromide Monohydrate (SPIRIVA RESPIMAT) 2.5 MCG/ACT AERS Inhale 2 puffs once daily (Patient taking differently: Inhale 2 puffs into the lungs as needed. Inhale 2 puffs once daily) 1 Inhaler 11 Past Week at Unknown time  . vitamin B-12 (CYANOCOBALAMIN) 1000 MCG tablet Take 1,000 mcg by mouth every morning.    01/09/2015 at Unknown time  . benzonatate (TESSALON) 100 MG capsule Take 1 capsule (100 mg total) by mouth 3 (three) times daily as needed for cough. 20 capsule 0 Taking  . doxycycline (VIBRA-TABS) 100 MG tablet Take 1 tablet (100 mg total) by mouth every 12 (twelve) hours. Starting tonight 7 tablet 0 Taking  . glucose blood (ACCU-CHEK SMARTVIEW) test strip 1 each by Other route 2 (two) times daily. Use as instructed 100  each 12   . Lancets (ACCU-CHEK SOFT TOUCH) lancets 1 each by Other route 2 (two) times daily. Use as instructed 100 each 12   . predniSONE (DELTASONE) 50 MG tablet Take 1 tablet (50 mg total) by mouth daily with breakfast. 1 tablet 0 Taking    Assessment: 73 yof to start warfarin for afib. Pt is thrombocytopenic and a HIT panel was sent. However, platelets have improved since heparin was stopped and the medical team  is ok with starting warfarin. H/H is low and platelets remain low. Also on cipro which may increase the INR.   Goal of Therapy:  INR 2-3 Monitor platelets by anticoagulation protocol: Yes   Plan:  1. Warfarin 5mg  PO x 1 tonight 2. Daily INR 3. F/u HIT panel and S&S of bleeding  Maximina Pirozzi, Rande Lawman 01/14/2015,6:14 PM

## 2015-01-14 NOTE — Progress Notes (Signed)
Patient ID: Kristi [name], female   DOB: 25-Feb-1941, 74 y.o.   MRN: [id number]    Patient Name: Kristi [name] Date of Encounter: 01/14/2015     Active Problems:   Renal failure   Congestive heart disease   Acute renal failure syndrome   Hyperkalemia   Symptomatic bradycardia   Atrial fibrillation    SUBJECTIVE  "I dont feel good". No chest pain. Sob and palpitations persist. CURRENT MEDS . arformoterol  15 mcg Nebulization BID  . atorvastatin  80 mg Oral q1800  . budesonide  0.5 mg Nebulization BID  . calcium acetate  667 mg Oral TID WC  . ciprofloxacin  400 mg Intravenous Q24H  . darbepoetin (ARANESP) injection - DIALYSIS  40 mcg Intravenous Q Sat-HD  . doxercalciferol  2 mcg Intravenous Q T,Th,Sa-HD  . ferric gluconate (FERRLECIT/NULECIT) IV  125 mg Intravenous Q T,Th,Sa-HD  . insulin aspart  0-15 Units Subcutaneous TID WC  . insulin aspart  0-5 Units Subcutaneous QHS  . ipratropium-albuterol  3 mL Nebulization QID  . multivitamin  1 tablet Oral QHS  . pantoprazole  40 mg Oral Daily  . sodium chloride  3 mL Intravenous Q12H    OBJECTIVE  Filed Vitals:   01/13/15 2054 01/13/15 2119 01/14/15 0422 01/14/15 0736  BP: 112/51  118/43 127/49  Pulse: 85  86 74  Temp: 98.5 F (36.9 C)  98.5 F (36.9 C) 98.6 F (37 C)  TempSrc:    Oral  Resp: 18  18 21   Height:      Weight:  190 lb 4.1 oz (86.3 kg)    SpO2: 97%  96% 94%    Intake/Output Summary (Last 24 hours) at 01/14/15 0816 Last data filed at 01/13/15 2118  Gross per 24 hour  Intake      0 ml  Output    425 ml  Net   -425 ml   Filed Weights   01/12/15 1803 01/13/15 0414 01/13/15 2119  Weight: 191 lb 12.8 oz (87 kg) 192 lb 3.9 oz (87.2 kg) 190 lb 4.1 oz (86.3 kg)    PHYSICAL EXAM  General: Pleasant, 74 yo woman, NAD. Neuro: Alert and oriented X 3. Moves all extremities spontaneously. Psych: Normal affect. HEENT:  Normal  Neck: Supple without bruits or JVD. Indwelling catheter in right neck Lungs:   Resp regular and unlabored, CTA. Heart: IRIRR no s3, s4, or murmurs. Abdomen: Soft, non-tender, non-distended, BS + x 4.  Extremities: No clubbing, cyanosis or edema. DP/PT/Radials 2+ and equal bilaterally. Left forearm ecchymotic and swollen  Accessory Clinical Findings  CBC  Recent Labs  01/13/15 0538  WBC 6.2  HGB 8.2*  HCT 26.0*  MCV 91.5  PLT 42*   Basic Metabolic Panel  Recent Labs  01/12/15 1200 01/13/15 0538  NA 135 138  K 3.6 3.6  CL 98 100  CO2 23 25  GLUCOSE 168* 168*  BUN 80* 61*  CREATININE 7.76* 6.20*  CALCIUM 8.9 8.8   Liver Function Tests  Recent Labs  01/13/15 1130  AST 19  ALT 7  ALKPHOS 73  BILITOT 0.9  PROT 6.1  ALBUMIN 2.7*    Recent Labs  01/13/15 1130  LIPASE 41   Cardiac Enzymes No results for input(s): CKTOTAL, CKMB, CKMBINDEX, TROPONINI in the last 72 hours. BNP Invalid input(s): POCBNP D-Dimer No results for input(s): DDIMER in the last 72 hours. Hemoglobin A1C No results for input(s): HGBA1C in the last 72 hours. Fasting Lipid Panel No  results for input(s): CHOL, HDL, LDLCALC, TRIG, CHOLHDL, LDLDIRECT in the last 72 hours. Thyroid Function Tests No results for input(s): TSH, T4TOTAL, T3FREE, THYROIDAB in the last 72 hours.  Invalid input(s): FREET3  TELE  Atrial fib with a controlled Vr.  Radiology/Studies  US Renal  01/09/2015   CLINICAL DATA:  Acute onset of renal failure.  Initial encounter.  EXAM: RENAL/URINARY TRACT ULTRASOUND COMPLETE  COMPARISON:  None.  FINDINGS: Right Kidney:  Length: 9.5 cm. Mildly increased parenchymal echogenicity. No mass or hydronephrosis visualized.  Left Kidney:  Length: 9.1 cm. Mildly increased parenchymal echogenicity. No mass or hydronephrosis visualized.  Bladder:  Appears normal for degree of bladder distention.  A small amount of ascites is noted about the liver and spleen.  IMPRESSION: 1. No evidence of hydronephrosis. 2. Mildly increased renal parenchymal echogenicity may  reflect medical renal disease. 3. Small amount of ascites noted about the liver and spleen.   Electronically Signed   By: Garald Balding M.D.   On: 01/09/2015 18:57   Dg Chest Port 1 View  01/12/2015   CLINICAL DATA:  ESRD needing dialysis.  Dialysis catheter placement.  EXAM: PORTABLE CHEST - 1 VIEW  COMPARISON:  01/09/2015  FINDINGS: New split tip dialysis catheter from right IJ approach, tips in the right atrium.  Unchanged cardiomegaly and vascular pedicle widening. There is pulmonary venous congestion without overt edema. No effusion or pneumothorax.  IMPRESSION: 1. New right-sided dialysis catheter, tips in the right atrium. 2. Cardiomegaly and progressive venous congestion.   Electronically Signed   By: Jorje Guild M.D.   On: 01/12/2015 15:20   Dg Chest Portable 1 View  01/09/2015   CLINICAL DATA:  Shortness of breath and wheezing for 2 days.  EXAM: PORTABLE CHEST - 1 VIEW  COMPARISON:  PA and lateral chest 10/30/2014 and 10/30/2012.  FINDINGS: There is marked cardiomegaly and vascular congestion. No consolidative process, pneumothorax or effusion is seen.  IMPRESSION: Cardiomegaly and pulmonary vascular congestion.   Electronically Signed   By: Inge Rise M.D.   On: 01/09/2015 16:26   Dg Fluoro Guide Cv Line-no Report  01/12/2015   CLINICAL DATA:    FLOURO GUIDE CV LINE  Fluoroscopy was utilized by the requesting physician.  No radiographic  interpretation.     ASSESSMENT AND PLAN  1. New onset atrial fib -  2. Acute on chronic renal failure 3. Thrombocytopenia 4. Infiltration of left arm HD site 5. Bradycardia, now resolved. Rec: I would restart metoprolol 12.5 mg twice daily and initiate coumadin once thrombocytopenia resolves. Please call for additional questions.  Aaban Griep,M.D.  01/14/2015 8:16 AM

## 2015-01-15 ENCOUNTER — Inpatient Hospital Stay (HOSPITAL_COMMUNITY): Payer: Commercial Managed Care - HMO

## 2015-01-15 DIAGNOSIS — R109 Unspecified abdominal pain: Secondary | ICD-10-CM | POA: Insufficient documentation

## 2015-01-15 DIAGNOSIS — R140 Abdominal distension (gaseous): Secondary | ICD-10-CM | POA: Insufficient documentation

## 2015-01-15 DIAGNOSIS — R1084 Generalized abdominal pain: Secondary | ICD-10-CM

## 2015-01-15 LAB — CBC
HCT: 22.5 % — ABNORMAL LOW (ref 36.0–46.0)
HEMOGLOBIN: 7.2 g/dL — AB (ref 12.0–15.0)
MCH: 29 pg (ref 26.0–34.0)
MCHC: 32 g/dL (ref 30.0–36.0)
MCV: 90.7 fL (ref 78.0–100.0)
Platelets: 65 10*3/uL — ABNORMAL LOW (ref 150–400)
RBC: 2.48 MIL/uL — AB (ref 3.87–5.11)
RDW: 19.1 % — ABNORMAL HIGH (ref 11.5–15.5)
WBC: 7.6 10*3/uL (ref 4.0–10.5)

## 2015-01-15 LAB — GLUCOSE, CAPILLARY
Glucose-Capillary: 128 mg/dL — ABNORMAL HIGH (ref 70–99)
Glucose-Capillary: 115 mg/dL — ABNORMAL HIGH (ref 70–99)
Glucose-Capillary: 115 mg/dL — ABNORMAL HIGH (ref 70–99)
Glucose-Capillary: 118 mg/dL — ABNORMAL HIGH (ref 70–99)

## 2015-01-15 LAB — BASIC METABOLIC PANEL
ANION GAP: 9 (ref 5–15)
BUN: 34 mg/dL — AB (ref 6–23)
CO2: 28 mmol/L (ref 19–32)
Calcium: 8.5 mg/dL (ref 8.4–10.5)
Chloride: 103 mmol/L (ref 96–112)
Creatinine, Ser: 3.35 mg/dL — ABNORMAL HIGH (ref 0.50–1.10)
GFR, EST AFRICAN AMERICAN: 15 mL/min — AB (ref >90)
GFR, EST NON AFRICAN AMERICAN: 13 mL/min — AB (ref >90)
Glucose, Bld: 123 mg/dL — ABNORMAL HIGH (ref 70–99)
Potassium: 4.1 mmol/L (ref 3.5–5.1)
Sodium: 140 mmol/L (ref 135–145)

## 2015-01-15 LAB — PROTIME-INR
INR: 1.26 (ref 0.00–1.49)
PROTHROMBIN TIME: 15.9 s — AB (ref 11.6–15.2)

## 2015-01-15 MED ORDER — WARFARIN VIDEO
Freq: Once | Status: AC
  Administered 2015-01-15: 18:00:00

## 2015-01-15 MED ORDER — COUMADIN BOOK
Freq: Once | Status: AC
  Administered 2015-01-15: 18:00:00
  Filled 2015-01-15: qty 1

## 2015-01-15 MED ORDER — IOHEXOL 300 MG/ML  SOLN: 25.0000 mL | Freq: Once | INTRAMUSCULAR | Status: AC | PRN

## 2015-01-15 MED ORDER — WARFARIN SODIUM 5 MG PO TABS
5.0000 mg | ORAL_TABLET | Freq: Once | ORAL | Status: AC
  Administered 2015-01-15: 5 mg via ORAL
  Filled 2015-01-15: qty 1

## 2015-01-15 NOTE — Progress Notes (Signed)
S:  Feels better with NG O:BP 98/43 mmHg  Pulse 72  Temp(Src) 97.9 F (36.6 C) (Oral)  Resp 19  Ht 5' (1.524 m)  Wt 85.7 kg (188 lb 15 oz)  BMI 36.90 kg/m2  SpO2 89%  Intake/Output Summary (Last 24 hours) at 01/15/15 0849 Last data filed at 01/15/15 0600  Gross per 24 hour  Intake      0 ml  Output    600 ml  Net   -600 ml   Weight change: -0.6 kg (-1 lb 5.2 oz) FYB:OFBPZ and alert  CVS:RRR Resp:Faint basilar crackles Abd:+ BS NTND Ext: no edema  Infiltration of AVF + bruit NEURO: Ox3 no asterixis, OX3 Rt IJ permcath   . arformoterol  15 mcg Nebulization BID  . atorvastatin  80 mg Oral q1800  . budesonide  0.5 mg Nebulization BID  . calcium acetate  667 mg Oral TID WC  . ciprofloxacin  400 mg Intravenous Q24H  . darbepoetin (ARANESP) injection - DIALYSIS  40 mcg Intravenous Q Sat-HD  . doxercalciferol  2 mcg Intravenous Q [name],Th,Sa-HD  . ferric gluconate (FERRLECIT/NULECIT) IV  125 mg Intravenous Q [name],Th,Sa-HD  . insulin aspart  0-15 Units Subcutaneous TID WC  . insulin aspart  0-5 Units Subcutaneous QHS  . ipratropium-albuterol  3 mL Nebulization QID  . metoprolol tartrate  12.5 mg Oral BID  . multivitamin  1 tablet Oral QHS  . pantoprazole  40 mg Oral Daily  . sodium chloride  3 mL Intravenous Q12H  . Warfarin - Pharmacist Dosing Inpatient   Does not apply q1800   Dg Abd 2 Views  01/14/2015   CLINICAL DATA:  Abdominal distention and pain with emesis for 5 days.  EXAM: ABDOMEN - 2 VIEW  COMPARISON:  None.  FINDINGS: Marked gaseous distention of the stomach. Small bowel and colon appear normal in caliber. No definite free air.  IMPRESSION: Massive distention of the stomach, suggesting gastric outlet obstruction.   Electronically Signed   By: Lorin Picket M.D.   On: 01/14/2015 19:36   BMET    Component Value Date/Time   NA 140 01/15/2015 0620   NA 141 08/25/2012 1033   K 4.1 01/15/2015 0620   K 4.3 08/25/2012 1033   CL 103 01/15/2015 0620   CL 108* 08/25/2012  1033   CO2 28 01/15/2015 0620   CO2 19* 08/25/2012 1033   GLUCOSE 123* 01/15/2015 0620   GLUCOSE 77 08/25/2012 1033   BUN 34* 01/15/2015 0620   BUN 68.0* 08/25/2012 1033   CREATININE 3.35* 01/15/2015 0620   CREATININE 2.61* 11/09/2014 0908   CREATININE 2.8* 05/10/2014   CREATININE 2.9* 08/25/2012 1033   CALCIUM 8.5 01/15/2015 0620   CALCIUM 9.5 08/25/2012 1033   CALCIUM 9.9 03/29/2010 0000   GFRNONAA 13* 01/15/2015 0620   GFRNONAA 24* 08/23/2011 1059   GFRAA 15* 01/15/2015 0620   GFRAA 30* 08/23/2011 1059   CBC    Component Value Date/Time   WBC 7.6 01/15/2015 0620   WBC 11.0* 08/25/2012 1033   RBC 2.48* 01/15/2015 0620   RBC 4.38 08/25/2012 1033   HGB 7.2* 01/15/2015 0620   HGB 12.0 08/25/2012 1033   HCT 22.5* 01/15/2015 0620   HCT 36.9 08/25/2012 1033   PLT 65* 01/15/2015 0620   PLT 154 08/25/2012 1033   MCV 90.7 01/15/2015 0620   MCV 84.2 08/25/2012 1033   MCH 29.0 01/15/2015 0620   MCH 27.4 08/25/2012 1033   MCHC 32.0 01/15/2015 0620  MCHC 32.5 08/25/2012 1033   RDW 19.1* 01/15/2015 0620   RDW 16.5* 08/25/2012 1033   LYMPHSABS 1.4 11/09/2014 0908   LYMPHSABS 1.8 08/25/2012 1033   MONOABS 0.4 11/09/2014 0908   MONOABS 0.9 08/25/2012 1033   EOSABS 0.3 11/09/2014 0908   EOSABS 0.3 08/25/2012 1033   BASOSABS 0.0 11/09/2014 0908   BASOSABS 0.1 08/25/2012 1033     Assessment: 1. New ESRD 2. Pulm edema 3. Hyperkalemia, improved 4 Pulm HTN 5. Anemia and fe def, receiving IV iron 6. Enterobacter UTI 7. Thrombocytopenia,  HIT neg 8. ? Gastric outlet obstruction  Plan: 1. Plan HD tomorrow 2.   Needs EGD  Kristi [name]

## 2015-01-15 NOTE — Progress Notes (Signed)
Family Medicine Teaching Service Daily Progress Note Intern Pager: [phone]  Patient name: Kristi [name] Medical record number: [id number] Date of birth: 02/27/1941 Age: 74 y.o. Gender: female  Primary Care Provider: Alease Frame Marylynn Pearson, MD Consultants: CCM, Renal, Cardiology, GI Code Status: Full  Assessment and Plan: 74yo female with acute renal failure admitted for emergent HD and for symptomatic bradycardia. PMH includes hyperlipidemia, hyperparathyroidism, CAD, MI (2008), HTN, COPD, DM, Anemia, CKD, GERD, Arthritis, CHF, CVA, DVT, and PAH  # Acute on Chronic Kidney Disease, considered new ESRD - Followed by Renal - greatly appreciate recommendations - Received HD on 2/1, 2/2, 2/4, 2/6 >> next HD 2/8 - Creatinine 6.20 >> 3.35, improved from 11.8 at admission.    # Atrial Fibrillation, New onset, questionable if paroxysmal - Controlled - On admission with symptomatic bradycardia (resolved s/p atropine x 1). Dx AFib evidenced on initial EKGs. ECHO with normal LV fxn. - Cardiology following - greatly appreciate recommendations - Continue Metoprolol 12.5mg  PO BID - Coumadin per pharm - chronic anticoagulation per Cards recs - Discontinue ASA on discharge  # Thrombocytopenia, possible concern for HIT - Improved - Concern for HIT but pattern is questionable. Rapid drop plt 200 to 114 within 7 hours of admission, did receive Heparin SQ (and note prior hospitalization >3 months ago with heparin SQ) may be pre-exposure. - Platelet trend 41 > 78 >> 65 - F/u HIT panel (pending) - Hold all heparin SQ and requested no heparin HD - Continue SCDs  # Abd Pain / Distention / Nausea - Likely d/t to massive distended stomach w/ likely gastric outlet obstruction - Identified on Abd X-ray 2/6, per radiology only partial obstruction. No prior abd imaging in record. Unclear etiology, however diff dx includes duodenitis w/ stricture (known hx GERD) vs concern for obstructing malignancy. Lipase 41, LFTs nml,  Lactic acid 1.5 >> 1.2 - o/n NPO with NGT low-int suction (2/6 >>) with >650 cc dark gastric contents - Consider transition NGT today - Significant improvement in abd symptoms, non-tender, mostly resolved nausea - Zofran PRN - Order CT Abd with PO contrast only - Consult GI - (unassigned [facility]) - greatly appreciate recommendations for further work-up of partial gastric outlet obstruction. Concerned for malignancy and request consideration of EGD  # Anemia- Hemoglobin 10.2 at admission - Hgb stable 7.2 >> 7.6 - Continue to monitor - Consider transfusion if < 8. Hold currently  # UTI, Enterobacter - Urinalysis moderate leukocytes, large hemoglobin, many bacteria - Blood clots noted in urine. Suspect this is secondary to catheter. If continues to have blood clots, will further evaluate - Urine Culture--Enterobacter cloacae, resistant to cefazolin, ceftriaxone, pip/tazo - Keflex 250 q6hr (2/3>>2/4) - Ciprofloxacin 400mg  q24 (2/4>>2/7, completed 5 days total abx) (resolved prolonged QTc)  # Pulmonary HTN - Home O2 req 2L [place] prior to hospitalization - Currently stable on 3L O2  # Chronic Diastolic Heart Failure - Last ECHO 10/29/14 of EF 94-70%, grade 3 diastolic dysfunction, moderate LVH, PAP 96 - Repeat ECHO EF 55-60%, mild LVH, mod diastolic dysfunction, mod-severe dilated RV, severe pulm HTN  - BNP- 2273.8, unreliable due to ESRD - Weight, Strict I/O (neg down 5 L)  # Prolonged QTc - Resolved - initially QTc 558 on 2/1 - Repeat EKG with QTc on 2/5 with 458  FEN/GI: Renal Diet 1200cc fluid restrict >>> NPO + chips 2/6 o/n with NGT PPx: SCDs  Disposition: Continue HD per Renal. Cardiology following for AFib and dCHF, starting anticoagulation. Additionally developed gastric  outlet obstruction treating with decompression, ordered abd CT with oral contrast, consulted GI for further work-up and possible EGD  Subjective: Overnight tolerated NGT placement and suction well. Reports  significant improvement in abd pain, distention, and nausea today. Resting in bed. Denies SOB, continues on 3L [place].  Objective: Temp:  [97.9 F (36.6 C)-99.4 F (37.4 C)] 97.9 F (36.6 C) (02/07 0759) Pulse Rate:  [47-102] 72 (02/07 0759) Resp:  [17-21] 19 (02/07 0759) BP: (94-126)/(35-57) 98/43 mmHg (02/07 0759) SpO2:  [89 %-99 %] 89 % (02/07 0816) Physical Exam: General: 74yo female resting, somewhat uncomfortable with nausea, NAD HEENT - NGT in place (dark gastric contents) Cardiovascular: RRR, no murmurs Respiratory: CTAB. No wheezes or focal crackles. [place] in place. Abdomen: Soft, notably improved mild distended general abdomen with +hypoactive BS, non-tender on exam (improved), no rebound no guarding.  Extremities: No edema noted.  Laboratory:  Recent Labs Lab 01/13/15 0538 01/14/15 0500 01/15/15 0620  WBC 6.2 7.2 7.6  HGB 8.2* 8.2* 7.2*  HCT 26.0* 25.7* 22.5*  PLT 42* 78* 65*    Recent Labs Lab 01/12/15 1200 01/13/15 0538 01/13/15 1130 01/15/15 0620  NA 135 138  --  140  K 3.6 3.6  --  4.1  CL 98 100  --  103  CO2 23 25  --  28  BUN 80* 61*  --  34*  CREATININE 7.76* 6.20*  --  3.35*  CALCIUM 8.9 8.8  --  8.5  PROT  --   --  6.1  --   BILITOT  --   --  0.9  --   ALKPHOS  --   --  73  --   ALT  --   --  7  --   AST  --   --  19  --   GLUCOSE 168* 168*  --  123*   Iron/TIBC/Ferritin/ %Sat    Component Value Date/Time   IRON 27* 01/10/2015 0741   TIBC 322 01/10/2015 0741   FERRITIN 37 01/10/2015 0741   IRONPCTSAT 8* 01/10/2015 0741   Urinalysis    Component Value Date/Time   COLORURINE YELLOW 01/10/2015 0116   APPEARANCEUR TURBID* 01/10/2015 0116   LABSPEC 1.025 01/10/2015 0116   PHURINE 7.0 01/10/2015 0116   GLUCOSEU NEGATIVE 01/10/2015 0116   HGBUR LARGE* 01/10/2015 0116   HGBUR trace-lysed 03/08/2010 1328   BILIRUBINUR NEGATIVE 01/10/2015 0116   KETONESUR NEGATIVE 01/10/2015 0116   PROTEINUR 100* 01/10/2015 0116   UROBILINOGEN 1.0  01/10/2015 0116   NITRITE NEGATIVE 01/10/2015 0116   LEUKOCYTESUR MODERATE* 01/10/2015 0116  - BNP 2273.8, unreliable due to CKD - Hepatitis B negative  Imaging/Diagnostic Tests: Dg Chest Port 1 View  01/12/2015   CLINICAL DATA:  ESRD needing dialysis.  Dialysis catheter placement.  EXAM: PORTABLE CHEST - 1 VIEW  COMPARISON:  01/09/2015  FINDINGS: New split tip dialysis catheter from right IJ approach, tips in the right atrium.  Unchanged cardiomegaly and vascular pedicle widening. There is pulmonary venous congestion without overt edema. No effusion or pneumothorax.  IMPRESSION: 1. New right-sided dialysis catheter, tips in the right atrium. 2. Cardiomegaly and progressive venous congestion.   Electronically Signed   By: Jorje Guild M.D.   On: 01/12/2015 15:20   Dg Abd 2 Views  01/14/2015   CLINICAL DATA:  Abdominal distention and pain with emesis for 5 days.  EXAM: ABDOMEN - 2 VIEW  COMPARISON:  None.  FINDINGS: Marked gaseous distention of the stomach. Small bowel and colon  appear normal in caliber. No definite free air.  IMPRESSION: Massive distention of the stomach, suggesting gastric outlet obstruction.   Electronically Signed   By: Lorin Picket M.D.   On: 01/14/2015 19:36   Dg Fluoro Guide Cv Line-no Report  01/12/2015   CLINICAL DATA:    FLOURO GUIDE CV LINE  Fluoroscopy was utilized by the requesting physician.  No radiographic  interpretation.    Renal US - no evidence of hydronephrosis  Nobie Putnam, DO 01/15/2015, 11:46 AM PGY-2, [facility] Intern pager: [phone], text pages welcome

## 2015-01-15 NOTE — Progress Notes (Signed)
ANTICOAGULATION CONSULT NOTE - Follow Up Consult  Pharmacy Consult for warfarin Indication: atrial fibrillation  Allergies  Allergen Reactions  . Losartan Other (See Comments)    Causes increase in creatinine and worsening CKD  . Varenicline Nausea Only and Other (See Comments)    Dizzy and GI symptoms     Patient Measurements: Height: 5' (152.4 cm) Weight: 188 lb 15 oz (85.7 kg) IBW/kg (Calculated) : 45.5  Vital Signs: Temp: 97.9 F (36.6 C) (02/07 0759) Temp Source: Oral (02/07 0759) BP: 98/43 mmHg (02/07 0759) Pulse Rate: 72 (02/07 0759)  Labs:  Recent Labs  01/13/15 0538 01/14/15 0500 01/14/15 1600 01/15/15 0620  HGB 8.2* 8.2*  --  7.2*  HCT 26.0* 25.7*  --  22.5*  PLT 42* 78*  --  65*  LABPROT  --   --  15.6* 15.9*  INR  --   --  1.23 1.26  CREATININE 6.20*  --   --  3.35*    Estimated Creatinine Clearance: 14.3 mL/min (by C-G formula based on Cr of 3.35).   Medications:  Scheduled:  . arformoterol  15 mcg Nebulization BID  . atorvastatin  80 mg Oral q1800  . budesonide  0.5 mg Nebulization BID  . calcium acetate  667 mg Oral TID WC  . ciprofloxacin  400 mg Intravenous Q24H  . darbepoetin (ARANESP) injection - DIALYSIS  40 mcg Intravenous Q Sat-HD  . doxercalciferol  2 mcg Intravenous Q T,Th,Sa-HD  . ferric gluconate (FERRLECIT/NULECIT) IV  125 mg Intravenous Q T,Th,Sa-HD  . insulin aspart  0-15 Units Subcutaneous TID WC  . insulin aspart  0-5 Units Subcutaneous QHS  . ipratropium-albuterol  3 mL Nebulization QID  . metoprolol tartrate  12.5 mg Oral BID  . multivitamin  1 tablet Oral QHS  . pantoprazole  40 mg Oral Daily  . sodium chloride  3 mL Intravenous Q12H  . Warfarin - Pharmacist Dosing Inpatient   Does not apply q1800    Assessment: 74 yo F presented with SOB and generalized weakness. Since admission, newly diagnosed with atrial fibrillation. Was just on heparin SQ, significant drop in plts 114>>42, concern for HIT. Panel sent 2/5, results  are negative. Hgb dropped 8.2>>7.2, PLTC 78>>65. No bleeding noted. BL INR 1.23, FM team thought platelets improved enough to start coumadin. Total predictor points = 3. Warfarin 5mg  given 2/5. INR today 1.26, effects of warfarin not expected to be seen yet.    Goal of Therapy:  INR 2-3 Monitor platelets by anticoagulation protocol: Yes   Plan:  Warfarin 5mg  x1 tonight Daily INR/PT Monitor s/sx of bleeding Monitor renal function  Thank you for allowing pharmacy to be part of this patient's care team  [place], Pharm.D Clinical Pharmacy Resident Pager: [phone] 01/15/2015 .2:44 PM

## 2015-01-16 ENCOUNTER — Encounter (HOSPITAL_COMMUNITY): Payer: Self-pay | Admitting: Vascular Surgery

## 2015-01-16 DIAGNOSIS — K311 Adult hypertrophic pyloric stenosis: Secondary | ICD-10-CM

## 2015-01-16 LAB — RENAL FUNCTION PANEL
Albumin: 2.4 g/dL — ABNORMAL LOW (ref 3.5–5.2)
Anion gap: 4 — ABNORMAL LOW (ref 5–15)
BUN: <5 mg/dL — ABNORMAL LOW (ref 6–23)
CALCIUM: 8.8 mg/dL (ref 8.4–10.5)
CHLORIDE: 114 mmol/L — AB (ref 96–112)
CO2: 34 mmol/L — AB (ref 19–32)
CREATININE: 0.41 mg/dL — AB (ref 0.50–1.10)
GFR calc Af Amer: >90 mL/min (ref >90)
GFR calc non Af Amer: >90 mL/min (ref >90)
GLUCOSE: 112 mg/dL — AB (ref 70–99)
Potassium: 2.4 mmol/L — CL (ref 3.5–5.1)
SODIUM: 152 mmol/L — AB (ref 135–145)

## 2015-01-16 LAB — CBC
HCT: 22.2 % — ABNORMAL LOW (ref 36.0–46.0)
HEMOGLOBIN: 7 g/dL — AB (ref 12.0–15.0)
MCH: 29.5 pg (ref 26.0–34.0)
MCHC: 31.5 g/dL (ref 30.0–36.0)
MCV: 93.7 fL (ref 78.0–100.0)
PLATELETS: 60 10*3/uL — AB (ref 150–400)
RBC: 2.37 MIL/uL — ABNORMAL LOW (ref 3.87–5.11)
RDW: 19.2 % — AB (ref 11.5–15.5)
WBC: 7.1 10*3/uL (ref 4.0–10.5)

## 2015-01-16 LAB — PROTIME-INR
INR: 1.94 — ABNORMAL HIGH (ref 0.00–1.49)
Prothrombin Time: 22.3 seconds — ABNORMAL HIGH (ref 11.6–15.2)

## 2015-01-16 LAB — PREPARE RBC (CROSSMATCH)

## 2015-01-16 LAB — ABO/RH: ABO/RH(D): O POS

## 2015-01-16 MED ORDER — PANTOPRAZOLE SODIUM 40 MG IV SOLR
40.0000 mg | Freq: Once | INTRAVENOUS | Status: AC
  Administered 2015-01-16: 40 mg via INTRAVENOUS
  Filled 2015-01-16 (×2): qty 40

## 2015-01-16 MED ORDER — DARBEPOETIN ALFA 100 MCG/0.5ML IJ SOSY
PREFILLED_SYRINGE | INTRAMUSCULAR | Status: AC
  Filled 2015-01-16: qty 0.5

## 2015-01-16 MED ORDER — WARFARIN - PHARMACIST DOSING INPATIENT: Freq: Every day | Status: DC

## 2015-01-16 MED ORDER — DOXERCALCIFEROL 4 MCG/2ML IV SOLN
INTRAVENOUS | Status: AC
  Filled 2015-01-16: qty 2

## 2015-01-16 MED ORDER — WARFARIN SODIUM 2.5 MG PO TABS
2.5000 mg | ORAL_TABLET | Freq: Once | ORAL | Status: DC
  Filled 2015-01-16: qty 1

## 2015-01-16 MED ORDER — SODIUM CHLORIDE 0.9 % IV SOLN
125.0000 mg | INTRAVENOUS | Status: AC
  Administered 2015-01-16 – 2015-01-23 (×4): 125 mg via INTRAVENOUS
  Filled 2015-01-16 (×7): qty 10

## 2015-01-16 MED ORDER — MORPHINE SULFATE 2 MG/ML IJ SOLN
INTRAMUSCULAR | Status: AC
  Filled 2015-01-16: qty 1

## 2015-01-16 MED ORDER — METOPROLOL TARTRATE 1 MG/ML IV SOLN
5.0000 mg | Freq: Two times a day (BID) | INTRAVENOUS | Status: DC
  Administered 2015-01-16 – 2015-01-22 (×12): 5 mg via INTRAVENOUS
  Filled 2015-01-16 (×15): qty 5

## 2015-01-16 MED ORDER — SODIUM CHLORIDE 0.9 % IV SOLN: Freq: Once | INTRAVENOUS | Status: DC

## 2015-01-16 MED ORDER — DARBEPOETIN ALFA 100 MCG/0.5ML IJ SOSY
100.0000 ug | PREFILLED_SYRINGE | INTRAMUSCULAR | Status: DC
  Administered 2015-01-16: 100 ug via INTRAVENOUS

## 2015-01-16 MED ORDER — DARBEPOETIN ALFA 100 MCG/0.5ML IJ SOSY
100.0000 ug | PREFILLED_SYRINGE | INTRAMUSCULAR | Status: DC
  Administered 2015-01-23: 100 ug via INTRAVENOUS
  Filled 2015-01-16: qty 0.5

## 2015-01-16 NOTE — Procedures (Signed)
I was present at this dialysis session. I have reviewed the session itself and made appropriate changes.   TDC. Qb 300 and UF goal 1L.  Hb 7.0, will transfuse 2u PRBC during HD.  Pt w/o co.  L RC AVF +B/T, extensive brusing.  Next HD 2/10.  Cont to rest AVF.  CLIP pending.  GI Eval pending as well.    Pearson Grippe  MD 01/16/2015, 8:34 AM

## 2015-01-16 NOTE — Consult Note (Signed)
Subjective:   HPI  The patient is a 74 year old female who was admitted to the hospital because of acute renal failure requiring emergent hemodialysis. We were asked to see her in regards to a massively distended stomach found on abdominal x-ray a couple of days ago. Due to the massive distention of the stomach an NG tube was placed and her stomach has been decompressed. The etiology of the massive distention of the stomach is unclear. The patient has been noticing some abdominal bloating for a few weeks.  Review of Systems Currently denies chest pain or shortness of breath.  Past Medical History  Diagnosis Date  . Hyperlipidemia   . Hyperparathyroidism   . CAD (coronary artery disease)   . Myocardial infarction 2008  . Hypertension   . COPD (chronic obstructive pulmonary disease)   . Diabetes mellitus     diagnosed 2010 - takes oral meds and lantus insulin  . Anemia   . Chronic kidney disease     esrd - since 2010  . GERD (gastroesophageal reflux disease)     on omeprazole  . Arthritis     all over  . CHF (congestive heart failure) 08-2011  . CVA 02/19/2010  . CAD 02/19/2010  . DEEP VENOUS THROMBOPHLEBITIS, LEG, LEFT 03/08/2010    in 2009 s/p hospitalization- only 1 isolated dvt  . Pulmonary artery hypertension 10/31/2014   Past Surgical History  Procedure Laterality Date  . Av fistula placement  04/05/11    Left Radiocephalic AVF  . Appendectomy  1969  . Tubal ligation    . Revision of fisturl    . Coronary angioplasty with stent placement  06/23/2007    by Dr. Irish Lack is her cardiologist  . Ligation goretex fistula  10/29/2011    Procedure: LIGATION GORE-TEX FISTULA;  Surgeon: Elam Dutch, MD;  Location: [facility] OR;  Service: Vascular;  Laterality: Left;  Ligation of competing branches left forearm fistula  . Fistulogram N/A 02/17/2012    Procedure: FISTULOGRAM;  Surgeon: Angelia Mould, MD;  Location: [facility] CATH LAB;  Service: Cardiovascular;  Laterality: N/A;  .  Insertion of dialysis catheter N/A 01/12/2015    Procedure: INSERTION OF DIALYSIS CATHETER;  Surgeon: Conrad Frazer, MD;  Location: [place];  Service: Vascular;  Laterality: N/A;   History   Social History  . Marital Status: Single    Spouse Name: N/A    Number of Children: 3  . Years of Education: N/A   Occupational History  . RetiredCatering manager Work    Social History Main Topics  . Smoking status: Former Smoker -- 0.50 packs/day for 53 years    Types: Cigarettes    Start date: 12/09/1960    Quit date: 03/31/2014  . Smokeless tobacco: Never Used  . Alcohol Use: 0.0 oz/week    0 Not specified per week  . Drug Use: No  . Sexual Activity: Not on file   Other Topics Concern  . Not on file   Social History Narrative   Health Care POA: [place]   Emergency Contact: Suzan Slick    End of Life Plan:    Who lives with you: Lives by her self in 1 story home   Any pets:    Diet: Patient typically eats frozen meal/sandwiches.  Has problems standing to cook.   Exercise: Patient does not have a regular exercise plan.   Seatbelts: Patient reports wearing seat belt when in vehicle.   Nancy Fetter Exposure/Protection: Patient does not  wear sun protection.   Hobbies: Crafts, knitting, crotcheting         family history includes Anuerysm in her father; Cancer in her mother; Dementia in her father; Pneumonia in her father.  Current facility-administered medications:  .  0.9 %  sodium chloride infusion, 250 mL, Intravenous, PRN, Rahul P Desai, PA-C .  0.9 %  sodium chloride infusion, 250 mL, Intravenous, PRN, Rahul P Desai, PA-C .  0.9 %  sodium chloride infusion, , Intravenous, Continuous, Albertha Ghee, MD, Last Rate: 10 mL/hr at 01/12/15 1257 .  0.9 %  sodium chloride infusion, , Intravenous, Once, Rexene Agent, MD .  arformoterol [facility]) nebulizer solution 15 mcg, 15 mcg, Nebulization, BID, Rahul P Desai, PA-C, 15 mcg at 01/15/15 1911 .  benzonatate (TESSALON)  capsule 100 mg, 100 mg, Oral, BID PRN, Holland N Rumley, DO .  budesonide (PULMICORT) nebulizer solution 0.5 mg, 0.5 mg, Nebulization, BID, Rahul P Desai, PA-C, 0.5 mg at 01/15/15 0815 .  calcium acetate (PHOSLO) capsule 667 mg, 667 mg, Oral, TID WC, Windy Kalata, MD, 667 mg at 01/15/15 1103 .  Darbepoetin Alfa (ARANESP) injection 100 mcg, 100 mcg, Intravenous, Q Mon-HD, Rexene Agent, MD .  doxercalciferol (HECTOROL) injection 2 mcg, 2 mcg, Intravenous, Q T,Th,Sa-HD, Windy Kalata, MD, 2 mcg at 01/16/15 [phone] .  feeding supplement (NEPRO CARB STEADY) liquid 237 mL, 237 mL, Oral, PRN, Elmarie Shiley, MD .  ferric gluconate (NULECIT) 125 mg in sodium chloride 0.9 % 100 mL IVPB, 125 mg, Intravenous, Q M,W,F-HD, Rexene Agent, MD, 125 mg at 01/16/15 0957 .  insulin aspart (novoLOG) injection 0-15 Units, 0-15 Units, Subcutaneous, TID WC, Collene Gobble, MD, 2 Units at 01/15/15 0830 .  insulin aspart (novoLOG) injection 0-5 Units, 0-5 Units, Subcutaneous, QHS, Collene Gobble, MD, 0 Units at 01/10/15 2200 .  ipratropium-albuterol (DUONEB) 0.5-2.5 (3) MG/3ML nebulizer solution 3 mL, 3 mL, Nebulization, QID, Collene Gobble, MD, 3 mL at 01/15/15 1911 .  metoprolol (LOPRESSOR) injection 5 mg, 5 mg, Intravenous, Q12H, Nobie Putnam, DO .  morphine 2 MG/ML injection 1 mg, 1 mg, Intravenous, Q3H PRN, Nobie Putnam, DO, 1 mg at 01/16/15 0856 .  multivitamin (RENA-VIT) tablet 1 tablet, 1 tablet, Oral, QHS, Windy Kalata, MD, 1 tablet at 01/15/15 2122 .  ondansetron (ZOFRAN) injection 4 mg, 4 mg, Intravenous, Q6H PRN, Nobie Putnam, DO, 4 mg at 01/15/15 1812 .  pantoprazole (PROTONIX) injection 40 mg, 40 mg, Intravenous, Once, Nobie Putnam, DO .  sodium chloride 0.9 % injection 3 mL, 3 mL, Intravenous, Q12H, Rahul P Desai, PA-C, 3 mL at 01/15/15 2123 .  sodium chloride 0.9 % injection 3 mL, 3 mL, Intravenous, PRN, Rahul P Desai, PA-C Allergies  Allergen Reactions  .  Losartan Other (See Comments)    Causes increase in creatinine and worsening CKD  . Varenicline Nausea Only and Other (See Comments)    Dizzy and GI symptoms      Objective:     BP 124/55 mmHg  Pulse 87  Temp(Src) 98.7 F (37.1 C) (Oral)  Resp 18  Ht 5' (1.524 m)  Wt 82.4 kg (181 lb 10.5 oz)  BMI 35.48 kg/m2  SpO2 93%  She is in dialysis when I saw her.  She is in no acute distress  Heart regular rhythm no murmurs  Lungs clear  Abdomen: Bowel sounds present, soft, not distended, not tender at this time.  Laboratory No components found for: D1  Assessment:     Massive distention of stomach  Acute renal failure requiring hemodialysis  Thrombocytopenia  Elevated prothrombin time and INR.      Plan:     The most likely etiology for the massive distention of the stomach in this particular situation would be electrolyte abnormality from her renal failure. The initial placement of the NG tube for decompression of the stomach is certainly the correct thing to do as continued massive distention of the stomach can cause ischemia and necrosis of the gastric wall. I would recommend continued NG suction and treatment of underlying medical problems. In view of her thrombocytopenia and elevated INR I would not recommend doing EGD at this time until these have been corrected. If we have to do an EGD with them elevated I would not plan to do biopsies for fear of causing excessive bleeding. A diagnostic EGD could be done without biopsy if necessary.

## 2015-01-16 NOTE — Progress Notes (Signed)
Family Medicine Teaching Service Daily Progress Note Intern Pager: [phone]  Patient name: Kristi [name] Medical record number: [id number] Date of birth: 1941/10/18 Age: 74 y.o. Gender: female  Primary Care Provider: Alease Frame Marylynn Pearson, MD Consultants: CCM, Renal, Cardiology, GI Code Status: Full  Assessment and Plan: 74yo female with acute renal failure admitted for emergent HD and for symptomatic bradycardia. PMH includes hyperlipidemia, hyperparathyroidism, CAD, MI (2008), HTN, COPD, DM, Anemia, CKD, GERD, Arthritis, CHF, CVA, DVT, and PAH  # Acute on Chronic Kidney Disease, considered new ESRD - Followed by Renal - greatly appreciate recommendations - Received HD on 2/1, 2/2, 2/4, 2/6 >> next HD 2/8 - Creatinine 6.20 >> 3.35, improved from 11.8 at admission.    # Atrial Fibrillation, New onset, questionable if paroxysmal - Controlled - On admission with symptomatic bradycardia (resolved s/p atropine x 1). Dx AFib evidenced on initial EKGs. ECHO with normal LV fxn. - Cardiology following - greatly appreciate recommendations - Continue Metoprolol 12.5mg  PO BID - Coumadin per pharm - chronic anticoagulation per Cards recs - Discontinue ASA on discharge  # Thrombocytopenia, possible concern for HIT - Improved  Concern for HIT but pattern is questionable. Rapid drop plt 200 to 114 within 7 hours of admission, did receive Heparin SQ (and note prior hospitalization >3 months ago with heparin SQ) may be pre-exposure.   - Evaluated medications for possible causes of low platelets.  Per pharmacy no current medications causing platelet abnormalities. - Platelet trend 41 > 78 >> 65>>60 - HIT panel 0.220 (normal) - Hold all heparin SQ and requested no heparin HD - Continue SCDs  # Abd Pain / Distention / Nausea - Likely d/t to massive distended stomach w/ likely gastric outlet obstruction - Identified on Abd X-ray 2/6, per radiology only partial obstruction. No prior abd imaging in record. Unclear  etiology, however diff dx includes duodenitis w/ stricture (known hx GERD) vs concern for obstructing malignancy. Lipase 41, LFTs nml, Lactic acid 1.5 >> 1.2 - o/n NPO with NGT low-int suction (2/6 >>) with >650 cc dark gastric contents - Consider transition NGT today - Significant improvement in abd symptoms, non-tender, mostly resolved nausea - Zofran PRN - Order CT Abd with PO contrast only - Consult GI - (unassigned [facility]) - greatly appreciate recommendations for further work-up of partial gastric outlet obstruction. Concerned for malignancy and request consideration of EGD  # Anemia- Hemoglobin 10.2 at admission - Hgb stable 7.2 >> 7.6>>7.0 - Continue to monitor - Consider transfusion if < 8. Hold currently  # UTI, Enterobacter - Urinalysis moderate leukocytes, large hemoglobin, many bacteria - Blood clots noted in urine. Suspect this is secondary to catheter. If continues to have blood clots, will further evaluate - Urine Culture--Enterobacter cloacae, resistant to cefazolin, ceftriaxone, pip/tazo - Keflex 250 q6hr (2/3>>2/4) - Ciprofloxacin 400mg  q24 (2/4>>2/7, completed 5 days total abx) (resolved prolonged QTc)  # Pulmonary HTN - Home O2 req 2L  prior to hospitalization - Currently stable on 3L O2  # Chronic Diastolic Heart Failure - Last ECHO 10/29/14 of EF 46-65%, grade 3 diastolic dysfunction, moderate LVH, PAP 96 - Repeat ECHO EF 55-60%, mild LVH, mod diastolic dysfunction, mod-severe dilated RV, severe pulm HTN  - BNP- 2273.8, unreliable due to ESRD - Weight, Strict I/O (neg down 5 L)  # Prolonged QTc - Resolved - initially QTc 558 on 2/1 - Repeat EKG with QTc on 2/5 with 458  FEN/GI: Renal Diet 1200cc fluid restrict >>> NPO + chips 2/6 o/n with  NGT PPx: SCDs  Disposition: Continue HD per Renal. Cardiology following for AFib and dCHF, starting anticoagulation. Additionally developed gastric outlet obstruction treating with decompression, ordered abd CT with  oral contrast, consulted GI for further work-up and possible EGD  Subjective: Patient reports some hip discomfort today but otherwise denies vomiting or discomfort elsewhere.  Voices good understanding of CT results.  Interested in GI consult.  Updated daughter, Marcie Bal on phone [phone].  Voices good understanding of current plan and concerns.  Will continue to update as able.  Objective: Temp:  [97.9 F (36.6 C)-98.2 F (36.8 C)] 97.9 F (36.6 C) (02/08 0712) Pulse Rate:  [80-90] 86 (02/08 0830) Resp:  [15-20] 20 (02/08 0830) BP: (104-125)/(42-64) 105/45 mmHg (02/08 0830) SpO2:  [84 %-97 %] 97 % (02/08 0712) Weight:  [181 lb 14.1 oz (82.5 kg)-182 lb 12.2 oz (82.9 kg)] 182 lb 12.2 oz (82.9 kg) (02/08 9470) Physical Exam: General: 74yo female resting in HD, somewhat uncomfortable with nausea, NAD HEENT - NGT in place (dark gastric contents) Cardiovascular: RRR, no murmurs Respiratory: CTAB. No wheezes or focal crackles. [place] in place. Abdomen: Soft, notably improved mild distended general abdomen with +hypoactive BS, non-tender on exam (improved), no rebound no guarding.  Extremities: No edema noted.  Laboratory:  Recent Labs Lab 01/14/15 0500 01/15/15 0620 01/16/15 0730  WBC 7.2 7.6 7.1  HGB 8.2* 7.2* 7.0*  HCT 25.7* 22.5* 22.2*  PLT 78* 65* 60*    Recent Labs Lab 01/13/15 0538 01/13/15 1130 01/15/15 0620 01/16/15 0730  NA 138  --  140 152*  K 3.6  --  4.1 2.4*  CL 100  --  103 114*  CO2 25  --  28 34*  BUN 61*  --  34* <5*  CREATININE 6.20*  --  3.35* 0.41*  CALCIUM 8.8  --  8.5 8.8  PROT  --  6.1  --   --   BILITOT  --  0.9  --   --   ALKPHOS  --  73  --   --   ALT  --  7  --   --   AST  --  19  --   --   GLUCOSE 168*  --  123* 112*   Iron/TIBC/Ferritin/ %Sat    Component Value Date/Time   IRON 27* 01/10/2015 0741   TIBC 322 01/10/2015 0741   FERRITIN 37 01/10/2015 0741   IRONPCTSAT 8* 01/10/2015 0741   Urinalysis    Component Value Date/Time    COLORURINE YELLOW 01/10/2015 0116   APPEARANCEUR TURBID* 01/10/2015 0116   LABSPEC 1.025 01/10/2015 0116   PHURINE 7.0 01/10/2015 0116   GLUCOSEU NEGATIVE 01/10/2015 0116   HGBUR LARGE* 01/10/2015 0116   HGBUR trace-lysed 03/08/2010 1328   BILIRUBINUR NEGATIVE 01/10/2015 0116   KETONESUR NEGATIVE 01/10/2015 0116   PROTEINUR 100* 01/10/2015 0116   UROBILINOGEN 1.0 01/10/2015 0116   NITRITE NEGATIVE 01/10/2015 0116   LEUKOCYTESUR MODERATE* 01/10/2015 0116  - BNP 2273.8, unreliable due to CKD - Hepatitis B negative  Imaging/Diagnostic Tests: Ct Abdomen Wo Contrast  01/15/2015   CLINICAL DATA:  Initial evaluation for abdominal distention and nausea, gastric outlet obstruction  EXAM: CT ABDOMEN WITHOUT CONTRAST  TECHNIQUE: Multidetector CT imaging of the abdomen was performed following the standard protocol without IV contrast.  COMPARISON:  01/14/2015 radiograph  FINDINGS: An NG tube has been placed which extends into the stomach. There is diffuse mild gastric wall thickening, with moderate wall thickening involving the proximal posterior gastric  wall. No discrete mass at the level of the gastroduodenal junction, although mucosal thickening is noted in this area as well. Oral contrast extends out of the stomach and throughout much of the visualized small bowel.  Given limited evaluation without IV contrast, liver and spleen are normal. Mild increased attenuation in the gallbladder suggests the presence of sludge.  Adrenal glands are normal. Both kidneys appear atrophic. Calcification of the aortoiliac vessels noted.  No significant retroperitoneal or mesenteric adenopathy.  Significant increased attenuation in the subcutaneous soft tissues of both flanks. Moderate bilateral pleural effusions with underlying compressive and deep in did atelectasis. No significant pericardial effusion. No acute musculoskeletal findings.  IMPRESSION: In this patient with massive gastric gaseous distention on radiographs  performed earlier this evening, an NG tube has been placed with the administration of oral contrast extending through the stomach and well into small bowel. There is diffuse gastric wall thickening, generally mild but more prominent in the proximal stomach. There is also mucosal thickening in the region of the pylorus. The stomach is nondistended at this time. Gastric wall thickening could indicate the presence of gastritis or diffuse infiltrating neoplasm.  Bilateral soft tissue flank edema and bilateral pleural effusions.   Electronically Signed   By: Skipper Cliche M.D.   On: 01/15/2015 20:51   Dg Chest Port 1 View  01/12/2015   CLINICAL DATA:  ESRD needing dialysis.  Dialysis catheter placement.  EXAM: PORTABLE CHEST - 1 VIEW  COMPARISON:  01/09/2015  FINDINGS: New split tip dialysis catheter from right IJ approach, tips in the right atrium.  Unchanged cardiomegaly and vascular pedicle widening. There is pulmonary venous congestion without overt edema. No effusion or pneumothorax.  IMPRESSION: 1. New right-sided dialysis catheter, tips in the right atrium. 2. Cardiomegaly and progressive venous congestion.   Electronically Signed   By: Jorje Guild M.D.   On: 01/12/2015 15:20   Dg Abd 2 Views  01/14/2015   CLINICAL DATA:  Abdominal distention and pain with emesis for 5 days.  EXAM: ABDOMEN - 2 VIEW  COMPARISON:  None.  FINDINGS: Marked gaseous distention of the stomach. Small bowel and colon appear normal in caliber. No definite free air.  IMPRESSION: Massive distention of the stomach, suggesting gastric outlet obstruction.   Electronically Signed   By: Lorin Picket M.D.   On: 01/14/2015 19:36   Dg Fluoro Guide Cv Line-no Report  01/12/2015   CLINICAL DATA:    FLOURO GUIDE CV LINE  Fluoroscopy was utilized by the requesting physician.  No radiographic  interpretation.    Renal US - no evidence of hydronephrosis  Janora Norlander, DO 01/16/2015, 9:59 AM PGY-1, [place]  Intern pager: [phone], text pages welcome

## 2015-01-16 NOTE — Discharge Summary (Signed)
[facility] Discharge Summary  Patient name: Kristi [name] Medical record number: [id number] Date of birth: [birth year] Age: 74 y.o. Gender: female Date of Admission: 01/09/2015  Date of Discharge: 01/25/2015 Admitting Physician: Kristi [name]. Kristi Gottron, MD  Primary Care Provider: Alease Frame Marylynn Pearson, MD Consultants: Nephrology, Hematology  Indication for Hospitalization: Acute Renal Failure  Discharge Diagnoses/Problem List:  ESRD/CKD Atrial Fibrillation Bradycardia Prolonged QT Thrombocytopenia Anemia UTI Heart Failure with Preserved EF Pulmonary HTN Hyperkalemia Nausea/Vomiting  Disposition: Discharge to [place]  Discharge Condition: Stable  Brief Hospital Course:  Kristi [name] presented to the Emergency Department on 01/09/15 with acute hypoxemic respiratory failure due to volume overload from ESRD/CKD, hyperkalemia, and acidosis. Kristi [name] was admitted to the ICU. She was transferred out to the general floor on 01/11/15.  Nephrology was consulted. Creatinine of 11.8 at admission. Potassium of 5.5 at admission. Emergent dialysis was done on 2/1 with further dialysis on 2/2, 2/2, 2/4, 2/6, 2/8, 2/10, 2/12, 2/15.  Renal ultrasound showed no evidence of hydronephrosis. Creatinine improved to 2.23 prior to discharge.  EKG showed atrial fibrillation at admission. Bradycardia was also noted at admission believed to be secondary to decreased clearance of metoprolol in setting of ESRD. HR improved throughout hospitalization. Cardiology was consulted. Echocardiogram showed mild LV hypertrophy with EF of 55-60%, moderate diastolic dysfunction, RV pressure/volume overload, moderately to severely dilated RV with mildly decreased systolic function, and severe pulmonary HTN. O2 [place] used throughout hospitalization for pulmonary HTN. Metoprolol was resumed on 2/6.   Urinalysis at admission showed moderate leukocytes, large hemoglobin, and many bacteria. Keflex started on  01/11/15. Urine culture grew Enterobacter cloacae and sensitivities showed resistance to Keflex. Keflex discontinued on 2/4 and Ciprofloxacin initiated and five day course completed.  Thrombocytopenia noted during hospitalization. Platelets 114 at admission, which trended down to 42 on 01/13/15. Heparin held. HIT panel normal. Cardiology recommended anticoagulation with Coumadin in setting of thrombocytopenia and atrial fibrillation. Coumadin dosed and titrated per pharmacy. Recommended DIC panel, which showed elevated PT, INR, PTT, and D dimer and normal Fibrinogen. VQ scan and lower extremity dopplers normal. Recommended SPEP and UPEP, which showed normal M spike. Platelets improved to 128 prior to discharge.  Kristi [name] complained of nausea throughout hospitalization. This was treated with Phenergan initially secondary to prolonged QT. Transitioned to Zofran as QT normalized. Lipase 41 with normal LFTs. KUB showed massive gastric distention with partial likely gastric outlet obstruction. NG tube placed on 2/6. CT scan showed diffused gastric wall thickening and mucosal thickening of the pylorus, concerning for gastritis or diffuse infiltrating neoplasm. Gastroenterology consulted and believed gastric distention was secondary to to electrolyte abnormalities from renal failure. Recommended continuation of NG tube suction and endoscopy once platelets and INR improved. Vitamin K given on 2/11 with improvement of INR. Endoscopy normal on 2/12. NG tube removed on 2/12 and diet advanced as tolerated.  Issues for Follow Up:  1. Aspirin discontinued and Coumadin initiated. Follow up INR.  2. Coumadin written as 7.5mg  the night of DC, then 5mg  daily after that (per pharm). Patient will need INR checked by SNF in 48-72hrs and should properly adjust dosing at that time.  3. Statin changed to atorvastatin at discharge due to renal toxicity. Pravastatin 10mg  could be considered if atorvastatin is too expensive.   4. Follow up CBC to monitor platelets. Platelets 128 at discharge.  Significant Procedures: Hemodialysis  Significant Labs and Imaging:   Recent Labs Lab 01/23/15 0500 01/24/15 0511 01/25/15 0513  WBC 4.2  3.0* 3.5*  HGB 8.9* 8.7* 9.1*  HCT 27.1* 28.3* 29.4*  PLT 100* 62* 128*    Recent Labs Lab 01/21/15 0500 01/22/15 0702 01/23/15 0930 01/24/15 0511 01/25/15 0513  NA 137 130* 130* 136 134*  K 3.1* 4.2 3.8 4.0 4.3  CL 101 99 98 102 101  CO2 26 18* 29 21 27   GLUCOSE 137* 103* 96 151* 147*  BUN 6 12 18 7 11   CREATININE 1.79* 2.25* 2.63* 1.66* 2.23*  CALCIUM 8.2* 8.8 8.8 8.4 8.9   Iron/TIBC/Ferritin/ %Sat    Component Value Date/Time   IRON 27* 01/10/2015 0741   TIBC 322 01/10/2015 0741   FERRITIN 37 01/10/2015 0741   IRONPCTSAT 8* 01/10/2015 0741   Urinalysis    Component Value Date/Time   COLORURINE YELLOW 01/10/2015 0116   APPEARANCEUR TURBID* 01/10/2015 0116   LABSPEC 1.025 01/10/2015 0116   PHURINE 7.0 01/10/2015 0116   GLUCOSEU NEGATIVE 01/10/2015 0116   HGBUR LARGE* 01/10/2015 0116   HGBUR trace-lysed 03/08/2010 1328   BILIRUBINUR NEGATIVE 01/10/2015 0116   KETONESUR NEGATIVE 01/10/2015 0116   PROTEINUR 100* 01/10/2015 0116   UROBILINOGEN 1.0 01/10/2015 0116   NITRITE NEGATIVE 01/10/2015 0116   LEUKOCYTESUR MODERATE* 01/10/2015 0116  - Troponin negative - BNP 2273.8 - Lactic Acid 1.2 - INR 1.26>1.94 - Cortisol 35.3 - PTH 397 - TSH 1.235 - Vitamin B >2000 - Fibrinogen 378 - D Dimer >20 - ANA negative - Hepatitis B negative - C. Difficile negative  Ct Abdomen Wo Contrast  01/15/2015   CLINICAL DATA:  Initial evaluation for abdominal distention and nausea, gastric outlet obstruction  EXAM: CT ABDOMEN WITHOUT CONTRAST  TECHNIQUE: Multidetector CT imaging of the abdomen was performed following the standard protocol without IV contrast.  COMPARISON:  01/14/2015 radiograph  FINDINGS: An NG tube has been placed which extends into the stomach. There  is diffuse mild gastric wall thickening, with moderate wall thickening involving the proximal posterior gastric wall. No discrete mass at the level of the gastroduodenal junction, although mucosal thickening is noted in this area as well. Oral contrast extends out of the stomach and throughout much of the visualized small bowel.  Given limited evaluation without IV contrast, liver and spleen are normal. Mild increased attenuation in the gallbladder suggests the presence of sludge.  Adrenal glands are normal. Both kidneys appear atrophic. Calcification of the aortoiliac vessels noted.  No significant retroperitoneal or mesenteric adenopathy.  Significant increased attenuation in the subcutaneous soft tissues of both flanks. Moderate bilateral pleural effusions with underlying compressive and deep in did atelectasis. No significant pericardial effusion. No acute musculoskeletal findings.  IMPRESSION: In this patient with massive gastric gaseous distention on radiographs performed earlier this evening, an NG tube has been placed with the administration of oral contrast extending through the stomach and well into small bowel. There is diffuse gastric wall thickening, generally mild but more prominent in the proximal stomach. There is also mucosal thickening in the region of the pylorus. The stomach is nondistended at this time. Gastric wall thickening could indicate the presence of gastritis or diffuse infiltrating neoplasm.  Bilateral soft tissue flank edema and bilateral pleural effusions.   Electronically Signed   By: Skipper Cliche M.D.   On: 01/15/2015 20:51   Dg Chest 1 View  01/19/2015   CLINICAL DATA:  Shortness of breath and weakness  EXAM: CHEST  1 VIEW  COMPARISON:  01/12/2015  FINDINGS: Dialysis catheter from right IJ approach is in stable position, tips in the  right atrium. There is a new orogastric tube which enters the stomach, with side port likely at the lower margin of the image.  There is improved  aeration since comparison. No edema, effusion, or pneumothorax.  Stable cardiomegaly.  IMPRESSION: 1. New nasogastric tube is in good position. 2. Interval clearing of previously noted opacities.   Electronically Signed   By: Monte Fantasia M.D.   On: 01/19/2015 13:54   Nm Pulmonary Perfusion  01/19/2015   CLINICAL DATA:  Short of breath. Dialysis patient. Positive D-dimer. History of deep venous thrombosis.  EXAM: NUCLEAR MEDICINE  - PERFUSION LUNG SCAN  TECHNIQUE: Perfusion images were obtained in multiple projections after intravenous injection of Tc-60m MAA.  RADIOPHARMACEUTICALS:  3.0 mCi Tc-41m MAA.  COMPARISON:  Chest radiograph 01/12/2015, 01/19/2015  FINDINGS: There are no wedge-shaped peripheral perfusion defects to suggest acute pulmonary embolism.  IMPRESSION: No evidence of pulmonary embolism.   Electronically Signed   By: Suzy Bouchard M.D.   On: 01/19/2015 12:57   US Renal  01/09/2015   CLINICAL DATA:  Acute onset of renal failure.  Initial encounter.  EXAM: RENAL/URINARY TRACT ULTRASOUND COMPLETE  COMPARISON:  None.  FINDINGS: Right Kidney:  Length: 9.5 cm. Mildly increased parenchymal echogenicity. No mass or hydronephrosis visualized.  Left Kidney:  Length: 9.1 cm. Mildly increased parenchymal echogenicity. No mass or hydronephrosis visualized.  Bladder:  Appears normal for degree of bladder distention.  A small amount of ascites is noted about the liver and spleen.  IMPRESSION: 1. No evidence of hydronephrosis. 2. Mildly increased renal parenchymal echogenicity may reflect medical renal disease. 3. Small amount of ascites noted about the liver and spleen.   Electronically Signed   By: Garald Balding M.D.   On: 01/09/2015 18:57   Dg Chest Port 1 View  01/12/2015   CLINICAL DATA:  ESRD needing dialysis.  Dialysis catheter placement.  EXAM: PORTABLE CHEST - 1 VIEW  COMPARISON:  01/09/2015  FINDINGS: New split tip dialysis catheter from right IJ approach, tips in the right atrium.   Unchanged cardiomegaly and vascular pedicle widening. There is pulmonary venous congestion without overt edema. No effusion or pneumothorax.  IMPRESSION: 1. New right-sided dialysis catheter, tips in the right atrium. 2. Cardiomegaly and progressive venous congestion.   Electronically Signed   By: Jorje Guild M.D.   On: 01/12/2015 15:20   Dg Chest Portable 1 View  01/09/2015   CLINICAL DATA:  Shortness of breath and wheezing for 2 days.  EXAM: PORTABLE CHEST - 1 VIEW  COMPARISON:  PA and lateral chest 10/30/2014 and 10/30/2012.  FINDINGS: There is marked cardiomegaly and vascular congestion. No consolidative process, pneumothorax or effusion is seen.  IMPRESSION: Cardiomegaly and pulmonary vascular congestion.   Electronically Signed   By: Inge Rise M.D.   On: 01/09/2015 16:26   Dg Abd 2 Views  01/14/2015   CLINICAL DATA:  Abdominal distention and pain with emesis for 5 days.  EXAM: ABDOMEN - 2 VIEW  COMPARISON:  None.  FINDINGS: Marked gaseous distention of the stomach. Small bowel and colon appear normal in caliber. No definite free air.  IMPRESSION: Massive distention of the stomach, suggesting gastric outlet obstruction.   Electronically Signed   By: Lorin Picket M.D.   On: 01/14/2015 19:36   Dg Fluoro Guide Cv Line-no Report  01/12/2015   CLINICAL DATA:    FLOURO GUIDE CV LINE  Fluoroscopy was utilized by the requesting physician.  No radiographic  interpretation.    Results/Tests Pending  at Time of Discharge: none  Discharge Medications:    Medication List    STOP taking these medications        aspirin 325 MG tablet     benzonatate 100 MG capsule  Commonly known as:  TESSALON     doxycycline 100 MG tablet  Commonly known as:  VIBRA-TABS     pravastatin 40 MG tablet  Commonly known as:  PRAVACHOL     predniSONE 50 MG tablet  Commonly known as:  DELTASONE      TAKE these medications        accu-chek soft touch lancets  1 each by Other route 2 (two) times daily.  Use as instructed     acetaminophen 500 MG tablet  Commonly known as:  TYLENOL  Take 1,000 mg by mouth every 6 (six) hours as needed.     acetaminophen-codeine 300-30 MG per tablet  Commonly known as:  TYLENOL #3  1 tablet every 6 (six) hours as needed for moderate pain.     albuterol 108 (90 BASE) MCG/ACT inhaler  Commonly known as:  PROVENTIL HFA  Inhale 2 puffs into the lungs every 4 (four) hours as needed. For wheezing     allopurinol 100 MG tablet  Commonly known as:  ZYLOPRIM  TAKE 2 TABLETS BY MOUTH EVERY DAY     amLODipine 5 MG tablet  Commonly known as:  NORVASC  Take 1 tablet (5 mg total) by mouth daily.     atorvastatin 40 MG tablet  Commonly known as:  LIPITOR  Take 1 tablet (40 mg total) by mouth daily.     [employer] ALLERGY RELIEF 180 MG tablet  Generic drug:  fexofenadine  TAKE 1 TABLET BY MOUTH EVERY DAY     Fish Oil 1200 MG Caps  Take 2 capsules by mouth 2 (two) times daily.     furosemide 80 MG tablet  Commonly known as:  LASIX  Take 1 tablet (80 mg total) by mouth daily.     glipiZIDE 5 MG 24 hr tablet  Commonly known as:  GLUCOTROL XL  Take 1 tablet (5 mg total) by mouth daily with breakfast.     glucosamine-chondroitin 500-400 MG tablet  Take 1 tablet by mouth 2 (two) times daily.     glucose blood test strip  Commonly known as:  ACCU-CHEK SMARTVIEW  1 each by Other route 2 (two) times daily. Use as instructed     HYDROcodone-acetaminophen 5-325 MG per tablet  Commonly known as:  NORCO/VICODIN  Take 1 tablet by mouth every 12 (twelve) hours as needed for moderate pain.     insulin glargine 100 UNIT/ML injection  Commonly known as:  LANTUS  Inject 0.13 mLs (13 Units total) into the skin every morning. If blood sugar is >140 add 1 unit. If blood sugar is <140 subtract 1 unit.     metoprolol succinate 25 MG 24 hr tablet  Commonly known as:  TOPROL-XL  Take 0.5 tablets (12.5 mg total) by mouth daily.     mometasone-formoterol 100-5 MCG/ACT Aero   Commonly known as:  DULERA  Inhale 2 puffs into the lungs 2 (two) times daily.     nitroGLYCERIN 0.4 MG SL tablet  Commonly known as:  NITROSTAT  Place 0.4 mg under the tongue every 5 (five) minutes as needed. For chest pain     Call 911 if pain not relieved by 3rd tab     NON FORMULARY  Place 2 L into the nose  continuous. 2 liters of o2     omeprazole 20 MG capsule  Commonly known as:  PRILOSEC  TAKE ONE CAPSULE BY MOUTH AT BEDTIME     oxyCODONE-acetaminophen 5-325 MG per tablet  Commonly known as:  PERCOCET/ROXICET  Take 1 tablet by mouth 3 (three) times daily as needed for severe pain.     SYSTANE OP  Apply 1-2 drops to eye 4 (four) times daily as needed. For dry eyes     Tiotropium Bromide Monohydrate 2.5 MCG/ACT Aers  Commonly known as:  SPIRIVA RESPIMAT  Inhale 2 puffs once daily     vitamin B-12 1000 MCG tablet  Commonly known as:  CYANOCOBALAMIN  Take 1,000 mcg by mouth every morning.     warfarin 5 MG tablet  Commonly known as:  COUMADIN  Take 1 tablet (5 mg total) by mouth daily. TAKE 7.5mg  (1.5 tablets) on day 1 (tonight). Then 1 tablet daily after that.        Discharge Instructions: Please refer to Patient Instructions section of EMR for full details.  Patient was counseled important signs and symptoms that should prompt return to medical care, changes in medications, dietary instructions, activity restrictions, and follow up appointments.   Follow-Up Appointments: Follow-up Information    Follow up with McKeag, Marylynn Pearson, MD. Go on 02/13/2015.   Specialty:  Family Medicine   Why:  @3 :00pm   Contact information:   [address] [address] [phone]       Elberta Leatherwood, MD 01/25/2015, 2:32 PM PGY-1, [place]

## 2015-01-16 NOTE — Progress Notes (Addendum)
ANTICOAGULATION CONSULT NOTE - Follow Up Consult  Pharmacy Consult for coumadin Indication: atrial fibrillation  Allergies  Allergen Reactions  . Losartan Other (See Comments)    Causes increase in creatinine and worsening CKD  . Varenicline Nausea Only and Other (See Comments)    Dizzy and GI symptoms     Patient Measurements: Height: 5' (152.4 cm) Weight: 181 lb 10.5 oz (82.4 kg) IBW/kg (Calculated) : 45.5   Vital Signs: Temp: 98.7 F (37.1 C) (02/08 1244) Temp Source: Oral (02/08 1244) BP: 124/55 mmHg (02/08 1244) Pulse Rate: 87 (02/08 1244)  Labs:  Recent Labs  01/14/15 0500 01/14/15 1600 01/15/15 0620 01/16/15 0730  HGB 8.2*  --  7.2* 7.0*  HCT 25.7*  --  22.5* 22.2*  PLT 78*  --  65* 60*  LABPROT  --  15.6* 15.9* 22.3*  INR  --  1.23 1.26 1.94*  CREATININE  --   --  3.35* 0.41*    Estimated Creatinine Clearance: 58.7 mL/min (by C-G formula based on Cr of 0.41).  Assessment: Patient is a 74 y.o on coumadin for afib.  INR is sub-therapeutic but increased noticeably from 1.26 to 1.94.  This may be reflective of drug-drug interaction with the Cipro he was on for a few days and that fact that he may be more sensitive to coumadin d/t poor renal function.  CBC remains low but stable.  No bleeding documented. Plan for possible EGD -per IM team, to hold coumadin for now  Goal of Therapy:  INR 2-3    Plan:  -hold coumadin for now  Calbert Hulsebus P 01/16/2015,2:21 PM

## 2015-01-17 DIAGNOSIS — R1013 Epigastric pain: Secondary | ICD-10-CM

## 2015-01-17 LAB — TYPE AND SCREEN
ABO/RH(D): O POS
Antibody Screen: NEGATIVE
UNIT DIVISION: 0
Unit division: 0

## 2015-01-17 LAB — BASIC METABOLIC PANEL
Anion gap: 10 (ref 5–15)
BUN: 25 mg/dL — ABNORMAL HIGH (ref 6–23)
CHLORIDE: 103 mmol/L (ref 96–112)
CO2: 28 mmol/L (ref 19–32)
Calcium: 9 mg/dL (ref 8.4–10.5)
Creatinine, Ser: 3.04 mg/dL — ABNORMAL HIGH (ref 0.50–1.10)
GFR calc Af Amer: 16 mL/min — ABNORMAL LOW (ref >90)
GFR, EST NON AFRICAN AMERICAN: 14 mL/min — AB (ref >90)
Glucose, Bld: 77 mg/dL (ref 70–99)
POTASSIUM: 4 mmol/L (ref 3.5–5.1)
SODIUM: 141 mmol/L (ref 135–145)

## 2015-01-17 LAB — GLUCOSE, CAPILLARY
GLUCOSE-CAPILLARY: 77 mg/dL (ref 70–99)
Glucose-Capillary: 109 mg/dL — ABNORMAL HIGH (ref 70–99)
Glucose-Capillary: 90 mg/dL (ref 70–99)
Glucose-Capillary: 87 mg/dL (ref 70–99)
Glucose-Capillary: 86 mg/dL (ref 70–99)

## 2015-01-17 LAB — CBC
HCT: 29 % — ABNORMAL LOW (ref 36.0–46.0)
Hemoglobin: 9.4 g/dL — ABNORMAL LOW (ref 12.0–15.0)
MCH: 28.9 pg (ref 26.0–34.0)
MCHC: 32.4 g/dL (ref 30.0–36.0)
MCV: 89.2 fL (ref 78.0–100.0)
PLATELETS: 43 10*3/uL — AB (ref 150–400)
RBC: 3.25 MIL/uL — ABNORMAL LOW (ref 3.87–5.11)
RDW: 20.7 % — AB (ref 11.5–15.5)
WBC: 9.2 10*3/uL (ref 4.0–10.5)

## 2015-01-17 LAB — PROTIME-INR
INR: 2.42 — ABNORMAL HIGH (ref 0.00–1.49)
Prothrombin Time: 26.5 seconds — ABNORMAL HIGH (ref 11.6–15.2)

## 2015-01-17 LAB — VITAMIN B12

## 2015-01-17 MED ORDER — PANTOPRAZOLE SODIUM 40 MG PO TBEC: 40.0000 mg | DELAYED_RELEASE_TABLET | Freq: Every day | ORAL | Status: DC

## 2015-01-17 MED ORDER — DEXTROSE-NACL 5-0.45 % IV SOLN
INTRAVENOUS | Status: DC
  Administered 2015-01-17 – 2015-01-21 (×6): via INTRAVENOUS

## 2015-01-17 MED ORDER — ACETAMINOPHEN 325 MG PO TABS: 650.0000 mg | ORAL_TABLET | Freq: Four times a day (QID) | ORAL | Status: DC | PRN

## 2015-01-17 NOTE — Evaluation (Signed)
Physical Therapy Evaluation Patient Details Name: Kristi [name] MRN: [id number] DOB: [birth year] Today's Date: 01/17/2015   History of Present Illness  Pt is a 74 y.o. F with PMH as outlined below. She presented to [facility] ED 2/1 with generalized weakness and SOB. She has hx of chronic renal insufficiency (stage IV CKD) but has never required dialysis. She does however have a left wrist AV fistula which has never been accessed before. She states she has been feeling weak for quite some time now, for the past few weeks but has never seeked medical attention. On day of presentation, she felt much worse than before and due to worsening SOB over past 3 days, decided to come to ED for further evaluation.  Clinical Impression  Pt admitted with above diagnosis. Pt currently with functional limitations due to the deficits listed below (see PT Problem List). At the time of PT eval pt was able to perform transfers with min assist for balance. Gait training was limited by NG tube at this time. Pt reports that since her admission in November 2015, she has been declining at home and requires assist from neighbors and family for IADL's. Pt reports concern with returning home without first undergoing some further skilled therapy at d/c. Pt will benefit from skilled PT to increase their independence and safety with mobility to allow discharge to the venue listed below.       Follow Up Recommendations SNF;Supervision/Assistance - 24 hour    Equipment Recommendations  None recommended by PT    Recommendations for Other Services       Precautions / Restrictions Precautions Precautions: Fall Restrictions Weight Bearing Restrictions: No      Mobility  Bed Mobility Overal bed mobility: Needs Assistance Bed Mobility: Supine to Sit     Supine to sit: Supervision     General bed mobility comments: HOB elevated; supervision for safety. Pt moving very slow and guarded due to NG tube.   Transfers Overall  transfer level: Needs assistance Equipment used: 1 person hand held assist Transfers: Sit to/from [facility] Sit to Stand: Min assist Stand pivot transfers: Min assist       General transfer comment: Steadying assist for sit>stand, and bed>BSC>recliner. Pt required cues for general safety awareness and assist for balance and management of lines.   Ambulation/Gait             General Gait Details: Gait training limited by NG tube at this time.   Stairs            Wheelchair Mobility    Modified Rankin (Stroke Patients Only)       Balance Overall balance assessment: Needs assistance Sitting-balance support: Feet supported;No upper extremity supported Sitting balance-Leahy Scale: Fair     Standing balance support: Single extremity supported Standing balance-Leahy Scale: Fair                               Pertinent Vitals/Pain Pain Assessment: No/denies pain (Discomfort with NG tube reported but no pain.)    Home Living Family/patient expects to be discharged to:: Private residence Living Arrangements: Alone Available Help at Discharge: Family;Friend(s);Available PRN/intermittently Type of Home: House Home Access: Stairs to enter Entrance Stairs-Rails: Right Entrance Stairs-Number of Steps: 3 Home Layout: One level Home Equipment: Walker - 2 wheels;Walker - 4 wheels;Cane - single point Additional Comments: Pt reports her activity at home has been decr over past month due to SOB.  Prior Function Level of Independence: Independent with assistive device(s)         Comments: Uses cane or walker at home.     Hand Dominance   Dominant Hand: Right    Extremity/Trunk Assessment   Upper Extremity Assessment: Defer to OT evaluation           Lower Extremity Assessment: Generalized weakness (4-/5 strength bilaterally)      Cervical / Trunk Assessment: Normal  Communication   Communication: No difficulties   Cognition Arousal/Alertness: Awake/alert Behavior During Therapy: WFL for tasks assessed/performed Overall Cognitive Status: Within Functional Limits for tasks assessed                      General Comments      Exercises        Assessment/Plan    PT Assessment Patient needs continued PT services  PT Diagnosis Difficulty walking   PT Problem List Decreased strength;Decreased range of motion;Decreased activity tolerance;Decreased balance;Decreased mobility;Decreased knowledge of use of DME;Decreased safety awareness;Decreased knowledge of precautions  PT Treatment Interventions DME instruction;Gait training;Stair training;Functional mobility training;Therapeutic activities;Therapeutic exercise;Neuromuscular re-education;Patient/family education   PT Goals (Current goals can be found in the Care Plan section) Acute Rehab PT Goals Patient Stated Goal: Return home after rehab PT Goal Formulation: With patient Time For Goal Achievement: 01/31/15 Potential to Achieve Goals: Good    Frequency Min 2X/week   Barriers to discharge Decreased caregiver support Pt lives alone    Co-evaluation               End of Session Equipment Utilized During Treatment: Gait belt Activity Tolerance: Patient tolerated treatment well Patient left: in chair;with chair alarm set;with call bell/phone within reach Nurse Communication: Mobility status         Time: 2585-2778 PT Time Calculation (min) (ACUTE ONLY): 29 min   Charges:   PT Evaluation $Initial PT Evaluation Tier I: 1 Procedure PT Treatments $Therapeutic Activity: 8-22 mins   PT G Codes:        Rolinda Roan Jan 22, 2015, 2:19 PM   Rolinda Roan, PT, DPT Acute Rehabilitation Services Pager: [phone]

## 2015-01-17 NOTE — Progress Notes (Signed)
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 sodium chloride   Intravenous Once  . arformoterol  15 mcg Nebulization BID  . budesonide  0.5 mg Nebulization BID  . calcium acetate  667 mg Oral TID WC  . darbepoetin (ARANESP) injection - DIALYSIS  100 mcg Intravenous Q Mon-HD  . doxercalciferol  2 mcg Intravenous Q T,Th,Sa-HD  . ferric gluconate (FERRLECIT/NULECIT) IV  125 mg Intravenous Q M,W,F-HD  . insulin aspart  0-15 Units Subcutaneous TID WC  . insulin aspart  0-5 Units Subcutaneous QHS  . ipratropium-albuterol  3 mL Nebulization QID  . metoprolol  5 mg Intravenous Q12H  . multivitamin  1 tablet Oral QHS  . sodium chloride  3 mL Intravenous Q12H   Continuous Infusions: . dextrose 5 % and 0.45% NaCl     PRN Meds:.sodium chloride, sodium chloride, acetaminophen, benzonatate, feeding supplement (NEPRO CARB STEADY), morphine injection, ondansetron (ZOFRAN) IV, sodium chloride  Current Labs: reviewed    Physical Exam:  Blood pressure 109/43, pulse 76, temperature 98.5 F (36.9 C), temperature source Oral, resp. rate 18, height 5' (1.524 m), weight 82.4 kg (181 lb 10.5 oz), SpO2 98 %. NAD IRIR, no rub Diminished in bases No LEE +B/T of L RC AVF Extensive aging brusing of distal LUE Nonfocal  A/P 1. New ESRD:  1. cont on MWF schedule 2. CLIP ongoing 2. N/V, gastric distension 1. Decompressed 2. NGT clamped 3. Hyperkalemia: resolved 4. Vol O/L / Pulm HTN 5. Anemia:  1. s/p 2u PRBC, appropriate increase 01/16/15 2. On ESA and IV Fe 6. Atrial Fibrillation on warfarin 7. 2HPTH: cont VDRA and  PhosLo  Pearson Grippe MD 01/17/2015, 4:05 PM   Recent Labs Lab 01/11/15 0716  01/15/15 0620 01/16/15 0730 01/17/15 0645  NA 135  < > 140 152* 141  K 4.0  < > 4.1 2.4* 4.0  CL 99  < > 103 114* 103  CO2 21  < > 28 34* 28  GLUCOSE 135*  < > 123* 112* 77  BUN 70*  < > 34* <5* 25*  CREATININE 7.26*  < > 3.35* 0.41* 3.04*  CALCIUM 8.5  < > 8.5 8.8 9.0  PHOS 8.3*  --   --  <1.0*  --   < > = values in this interval not displayed.  Recent Labs Lab 01/15/15 0620 01/16/15 0730 01/17/15 0645  WBC 7.6 7.1 9.2  HGB 7.2* 7.0* 9.4*  HCT 22.5* 22.2* 29.0*  MCV 90.7 93.7 89.2  PLT 65* 60* 43*

## 2015-01-17 NOTE — Progress Notes (Signed)
Family Medicine Teaching Service Daily Progress Note Intern Pager: [phone]  Patient name: Kristi [name] Medical record number: [id number] Date of birth: 1941-03-16 Age: 74 y.o. Gender: female  Primary Care Provider: Alease Frame Marylynn Pearson, MD Consultants: CCM, Renal, Cardiology, GI Code Status: Full  Assessment and Plan: 74yo female with acute renal failure admitted for emergent HD and for symptomatic bradycardia. PMH includes hyperlipidemia, hyperparathyroidism, CAD, MI (2008), HTN, COPD, DM, Anemia, CKD, GERD, Arthritis, CHF, CVA, DVT, and PAH  # Acute on Chronic Kidney Disease, considered new ESRD - Followed by Renal - greatly appreciate recommendations - Received HD on 2/1, 2/2, 2/4, 2/6, 2/8 - Creatinine 6.20 >> 3.35>>3.04, improved from 11.8 at admission.    # Atrial Fibrillation, New onset, questionable if paroxysmal - Controlled - On admission with symptomatic bradycardia (resolved s/p atropine x 1). Dx AFib evidenced on initial EKGs. ECHO with normal LV fxn. - Cardiology following - greatly appreciate recommendations - Continue Metoprolol 12.5mg  PO BID - Coumadin per pharm - chronic anticoagulation per Cards recs - Discontinue ASA on discharge  # Thrombocytopenia - Improved   - Platelet trend 41 > 78 >> 65>>60>>43 - INR 2.42 - HIT panel 0.220 (normal) - Hold all heparin SQ and requested no heparin HD - Continue SCDs  # Abd Pain / Distention / Nausea - Likely d/t to massive distended stomach w/ likely gastric outlet obstruction - Diff dx includes duodenitis w/ stricture (known hx GERD) vs concern for obstructing malignancy vs. Electrolyte abnormality - Lipase 41, LFTs nml, Lactic acid 1.5 >> 1.2 - Identified on Abd X-ray 2/6, per radiology only partial obstruction - CT- diffuse gastric wall thickening, generally mild but more prominent in proximal stomach; mucosal thickening in region of the pylorus; could indicate gastritis or diffuse infiltrating neoplasm - Zofran PRN - NG  Tube in place - Consult GI - (unassigned [facility]) - greatly appreciate recommendations for further work-up of partial gastric outlet obstruction. Concerned for malignancy and request consideration of EGD  - Most likely secondary to electrolyte abnormality from renal failure  - Continue NG tube  - No EGD until thrombocytopenia and elevated INR (goal <1.4) corrected, may do without biopsies if necessary  # Anemia- Hemoglobin 10.2 at admission - Hgb stable 7.2 >> 7.6>>7.0>>9.4 - Continue to monitor  # UTI, Enterobacter - Urinalysis moderate leukocytes, large hemoglobin, many bacteria - Blood clots noted in urine. Suspect this is secondary to catheter. If continues to have blood clots, will further evaluate - Urine Culture--Enterobacter cloacae, resistant to cefazolin, ceftriaxone, pip/tazo - Keflex 250 q6hr (2/3>>2/4) - Ciprofloxacin 400mg  q24 (2/4>>2/7, completed 5 days total abx) (resolved prolonged QTc)  # Pulmonary HTN - Home O2 req 2L [place] prior to hospitalization - Currently stable on 3L O2  # Chronic Diastolic Heart Failure - Last ECHO 10/29/14 of EF 02-54%, grade 3 diastolic dysfunction, moderate LVH, PAP 96 - Repeat ECHO EF 55-60%, mild LVH, mod diastolic dysfunction, mod-severe dilated RV, severe pulm HTN  - BNP- 2273.8, unreliable due to ESRD - Weight, Strict I/O (neg down 5 L)  # Prolonged QTc - Resolved - initially QTc 558 on 2/1 - Repeat EKG with QTc on 2/5 with 458  FEN/GI: Renal Diet 1200cc fluid restrict >>> NPO + chips 2/6 o/n with NGT PPx: SCDs  Disposition: Continue HD per Renal. Cardiology following for AFib and dCHF, starting anticoagulation. Additionally developed gastric outlet obstruction treating with decompression, ordered abd CT with oral contrast, consulted GI for further work-up and possible EGD  Subjective:  No acute complaints overnight. States shortness of breath has improved. Nausea has also improved with NG tube, which she would like to be removed  when possible. Very frustrated today because it seems like one thing after another keeps coming up and preventing discharge.  Would like for her pastor to come by and visit with her.  Updated daughter, Marcie Bal on phone [phone].  Voices good understanding of current plan and concerns.  Will continue to update as able.  Objective: Temp:  [97.8 F (36.6 C)-98.7 F (37.1 C)] 98.6 F (37 C) (02/09 0600) Pulse Rate:  [78-100] 78 (02/09 0600) Resp:  [15-24] 18 (02/09 0600) BP: (105-125)/(37-56) 125/52 mmHg (02/09 0600) SpO2:  [92 %-98 %] 94 % (02/09 0600) Weight:  [181 lb 10.5 oz (82.4 kg)-182 lb 12.2 oz (82.9 kg)] 181 lb 10.5 oz (82.4 kg) (02/08 1100) Physical Exam: General: 74yo female resting in HD, somewhat uncomfortable with nausea, NAD HEENT - NGT in place (dark gastric contents) Cardiovascular: RRR, no murmurs Respiratory: CTAB. No wheezes or focal crackles. [place] in place. Abdomen: Soft, notably improved mild distended general abdomen with +hypoactive BS, non-tender on exam (improved), no rebound no guarding.  Extremities: No edema noted.  Laboratory:  Recent Labs Lab 01/14/15 0500 01/15/15 0620 01/16/15 0730  WBC 7.2 7.6 7.1  HGB 8.2* 7.2* 7.0*  HCT 25.7* 22.5* 22.2*  PLT 78* 65* 60*    Recent Labs Lab 01/13/15 0538 01/13/15 1130 01/15/15 0620 01/16/15 0730  NA 138  --  140 152*  K 3.6  --  4.1 2.4*  CL 100  --  103 114*  CO2 25  --  28 34*  BUN 61*  --  34* <5*  CREATININE 6.20*  --  3.35* 0.41*  CALCIUM 8.8  --  8.5 8.8  PROT  --  6.1  --   --   BILITOT  --  0.9  --   --   ALKPHOS  --  73  --   --   ALT  --  7  --   --   AST  --  19  --   --   GLUCOSE 168*  --  123* 112*   Iron/TIBC/Ferritin/ %Sat    Component Value Date/Time   IRON 27* 01/10/2015 0741   TIBC 322 01/10/2015 0741   FERRITIN 37 01/10/2015 0741   IRONPCTSAT 8* 01/10/2015 0741   Urinalysis    Component Value Date/Time   COLORURINE YELLOW 01/10/2015 0116   APPEARANCEUR TURBID*  01/10/2015 0116   LABSPEC 1.025 01/10/2015 0116   PHURINE 7.0 01/10/2015 0116   GLUCOSEU NEGATIVE 01/10/2015 0116   HGBUR LARGE* 01/10/2015 0116   HGBUR trace-lysed 03/08/2010 1328   BILIRUBINUR NEGATIVE 01/10/2015 0116   KETONESUR NEGATIVE 01/10/2015 0116   PROTEINUR 100* 01/10/2015 0116   UROBILINOGEN 1.0 01/10/2015 0116   NITRITE NEGATIVE 01/10/2015 0116   LEUKOCYTESUR MODERATE* 01/10/2015 0116  - BNP 2273.8, unreliable due to CKD - Hepatitis B negative  Imaging/Diagnostic Tests: Ct Abdomen Wo Contrast  01/15/2015   CLINICAL DATA:  Initial evaluation for abdominal distention and nausea, gastric outlet obstruction  EXAM: CT ABDOMEN WITHOUT CONTRAST  TECHNIQUE: Multidetector CT imaging of the abdomen was performed following the standard protocol without IV contrast.  COMPARISON:  01/14/2015 radiograph  FINDINGS: An NG tube has been placed which extends into the stomach. There is diffuse mild gastric wall thickening, with moderate wall thickening involving the proximal posterior gastric wall. No discrete mass at the level of the gastroduodenal  junction, although mucosal thickening is noted in this area as well. Oral contrast extends out of the stomach and throughout much of the visualized small bowel.  Given limited evaluation without IV contrast, liver and spleen are normal. Mild increased attenuation in the gallbladder suggests the presence of sludge.  Adrenal glands are normal. Both kidneys appear atrophic. Calcification of the aortoiliac vessels noted.  No significant retroperitoneal or mesenteric adenopathy.  Significant increased attenuation in the subcutaneous soft tissues of both flanks. Moderate bilateral pleural effusions with underlying compressive and deep in did atelectasis. No significant pericardial effusion. No acute musculoskeletal findings.  IMPRESSION: In this patient with massive gastric gaseous distention on radiographs performed earlier this evening, an NG tube has been placed  with the administration of oral contrast extending through the stomach and well into small bowel. There is diffuse gastric wall thickening, generally mild but more prominent in the proximal stomach. There is also mucosal thickening in the region of the pylorus. The stomach is nondistended at this time. Gastric wall thickening could indicate the presence of gastritis or diffuse infiltrating neoplasm.  Bilateral soft tissue flank edema and bilateral pleural effusions.   Electronically Signed   By: Skipper Cliche M.D.   On: 01/15/2015 20:51   Dg Abd 2 Views  01/14/2015   CLINICAL DATA:  Abdominal distention and pain with emesis for 5 days.  EXAM: ABDOMEN - 2 VIEW  COMPARISON:  None.  FINDINGS: Marked gaseous distention of the stomach. Small bowel and colon appear normal in caliber. No definite free air.  IMPRESSION: Massive distention of the stomach, suggesting gastric outlet obstruction.   Electronically Signed   By: Lorin Picket M.D.   On: 01/14/2015 19:36   Renal US - no evidence of hydronephrosis  Lorna Few, DO 01/17/2015, 6:41 AM PGY-1, [place] Intern pager: [phone], text pages welcome

## 2015-01-18 DIAGNOSIS — D638 Anemia in other chronic diseases classified elsewhere: Secondary | ICD-10-CM

## 2015-01-18 DIAGNOSIS — R791 Abnormal coagulation profile: Secondary | ICD-10-CM

## 2015-01-18 DIAGNOSIS — D472 Monoclonal gammopathy: Secondary | ICD-10-CM

## 2015-01-18 DIAGNOSIS — Z992 Dependence on renal dialysis: Secondary | ICD-10-CM

## 2015-01-18 DIAGNOSIS — I4891 Unspecified atrial fibrillation: Secondary | ICD-10-CM

## 2015-01-18 DIAGNOSIS — R0602 Shortness of breath: Secondary | ICD-10-CM

## 2015-01-18 DIAGNOSIS — E561 Deficiency of vitamin K: Secondary | ICD-10-CM

## 2015-01-18 DIAGNOSIS — Z86718 Personal history of other venous thrombosis and embolism: Secondary | ICD-10-CM

## 2015-01-18 DIAGNOSIS — D696 Thrombocytopenia, unspecified: Secondary | ICD-10-CM

## 2015-01-18 LAB — DIC (DISSEMINATED INTRAVASCULAR COAGULATION) PANEL
APTT: 86 s — AB (ref 24–37)
FIBRINOGEN: 378 mg/dL (ref 204–475)
INR: 2.19 — AB (ref 0.00–1.49)
PLATELETS: 49 10*3/uL — AB (ref 150–400)
SMEAR REVIEW: NONE SEEN

## 2015-01-18 LAB — CBC WITH DIFFERENTIAL/PLATELET
BASOS PCT: 1 % (ref 0–1)
Basophils Absolute: 0.1 10*3/uL (ref 0.0–0.1)
Eosinophils Absolute: 0.2 10*3/uL (ref 0.0–0.7)
Eosinophils Relative: 2 % (ref 0–5)
HCT: 30.1 % — ABNORMAL LOW (ref 36.0–46.0)
Hemoglobin: 9.7 g/dL — ABNORMAL LOW (ref 12.0–15.0)
Lymphocytes Relative: 12 % (ref 12–46)
Lymphs Abs: 1.1 10*3/uL (ref 0.7–4.0)
MCH: 29 pg (ref 26.0–34.0)
MCHC: 32.2 g/dL (ref 30.0–36.0)
MCV: 89.9 fL (ref 78.0–100.0)
Monocytes Absolute: 0.7 10*3/uL (ref 0.1–1.0)
Monocytes Relative: 8 % (ref 3–12)
NEUTROS PCT: 78 % — AB (ref 43–77)
Neutro Abs: 6.9 10*3/uL (ref 1.7–7.7)
PLATELETS: 64 10*3/uL — AB (ref 150–400)
RBC: 3.35 MIL/uL — ABNORMAL LOW (ref 3.87–5.11)
RDW: 20.4 % — ABNORMAL HIGH (ref 11.5–15.5)
WBC: 8.9 10*3/uL (ref 4.0–10.5)

## 2015-01-18 LAB — RENAL FUNCTION PANEL
ALBUMIN: 2.4 g/dL — AB (ref 3.5–5.2)
Anion gap: 13 (ref 5–15)
BUN: 33 mg/dL — AB (ref 6–23)
CO2: 28 mmol/L (ref 19–32)
CREATININE: 3.56 mg/dL — AB (ref 0.50–1.10)
Calcium: 9.3 mg/dL (ref 8.4–10.5)
Chloride: 104 mmol/L (ref 96–112)
GFR calc Af Amer: 14 mL/min — ABNORMAL LOW (ref >90)
GFR calc non Af Amer: 12 mL/min — ABNORMAL LOW (ref >90)
GLUCOSE: 95 mg/dL (ref 70–99)
POTASSIUM: 2.9 mmol/L — AB (ref 3.5–5.1)
Phosphorus: 4.2 mg/dL (ref 2.3–4.6)
Sodium: 145 mmol/L (ref 135–145)

## 2015-01-18 LAB — GLUCOSE, CAPILLARY
GLUCOSE-CAPILLARY: 74 mg/dL (ref 70–99)
GLUCOSE-CAPILLARY: 91 mg/dL (ref 70–99)
Glucose-Capillary: 79 mg/dL (ref 70–99)
Glucose-Capillary: 102 mg/dL — ABNORMAL HIGH (ref 70–99)

## 2015-01-18 LAB — DIC (DISSEMINATED INTRAVASCULAR COAGULATION)PANEL: Prothrombin Time: 24.5 seconds — ABNORMAL HIGH (ref 11.6–15.2)

## 2015-01-18 LAB — PROTIME-INR
INR: 2.24 — AB (ref 0.00–1.49)
PROTHROMBIN TIME: 25 s — AB (ref 11.6–15.2)

## 2015-01-18 LAB — FOLATE RBC
Folate, Hemolysate: 538.7 ng/mL
Folate, RBC: 1826 ng/mL (ref >498)
HEMATOCRIT: 29.5 % — AB (ref 34.0–46.6)

## 2015-01-18 LAB — SAVE SMEAR

## 2015-01-18 LAB — FIBRINOGEN: Fibrinogen: 589 mg/dL — ABNORMAL HIGH (ref 204–475)

## 2015-01-18 LAB — TECHNOLOGIST SMEAR REVIEW

## 2015-01-18 MED ORDER — MORPHINE SULFATE 2 MG/ML IJ SOLN
INTRAMUSCULAR | Status: AC
  Filled 2015-01-18: qty 1

## 2015-01-18 MED ORDER — PANTOPRAZOLE SODIUM 40 MG IV SOLR
40.0000 mg | INTRAVENOUS | Status: DC
  Administered 2015-01-18 – 2015-01-22 (×5): 40 mg via INTRAVENOUS
  Filled 2015-01-18 (×6): qty 40

## 2015-01-18 NOTE — Progress Notes (Signed)
Family Medicine Teaching Service Daily Progress Note Intern Pager: [phone]  Patient name: Kristi [name] Medical record number: [id number] Date of birth: 02-09-1941 Age: 74 y.o. Gender: female  Primary Care Provider: Alease Frame Marylynn Pearson, MD Consultants: CCM, Renal, Cardiology, GI Code Status: Full  Assessment and Plan: 74yo female with acute renal failure admitted for emergent HD and for symptomatic bradycardia. PMH includes hyperlipidemia, hyperparathyroidism, CAD, MI (2008), HTN, COPD, DM, Anemia, CKD, GERD, Arthritis, CHF, CVA, DVT, and PAH  # Acute on Chronic Kidney Disease, considered new ESRD - Followed by Renal - greatly appreciate recommendations - Received HD on 2/1, 2/2, 2/4, 2/6, 2/8--Dialysis today - Creatinine 3.04>>3.56, improved from 11.8 at admission.   - Potassium 2.9. Will recheck this afternoon after dialysis  # Atrial Fibrillation, New onset, questionable if paroxysmal - Controlled - On admission with symptomatic bradycardia (resolved s/p atropine x 1). Dx AFib evidenced on initial EKGs. ECHO with normal LV fxn. - Cardiology following - greatly appreciate recommendations - Continue Metoprolol 12.5mg  PO BID - Coumadin per pharm - chronic anticoagulation per Cards recs--Holding for Anticipated Endoscopy - Discontinue ASA on discharge  # Thrombocytopenia - Improved   - Platelet trend 43>>64 - INR 2.42>todays value pending - HIT panel 0.220 (normal) - Follow up Peripheral Blood Smear, Folate, and Fibrinogen - Vitamin B12 >2000 - Hold all heparin SQ and requested no heparin HD - Continue SCDs - Hematology consulted. Appreciate recommendations.  # Abd Pain / Distention / Nausea - Likely d/t to massive distended stomach w/ likely gastric outlet obstruction - Diff dx includes duodenitis w/ stricture (known hx GERD) vs concern for obstructing malignancy vs. Electrolyte abnormality - Lipase 41, LFTs nml, Lactic acid 1.5 >> 1.2 - Identified on Abd X-ray 2/6, per radiology  only partial obstruction - CT- diffuse gastric wall thickening, generally mild but more prominent in proximal stomach; mucosal thickening in region of the pylorus; could indicate gastritis or diffuse infiltrating neoplasm - Zofran PRN - NG Tube in place - Consult GI - (unassigned [facility]) - greatly appreciate recommendations for further work-up of partial gastric outlet obstruction. Concerned for malignancy and request consideration of EGD  - Most likely secondary to electrolyte abnormality from renal failure  - Continue NG tube  - No EGD until thrombocytopenia and elevated INR (goal <1.4) corrected, may do without biopsies if necessary  # Anemia- Hemoglobin 10.2 at admission - Hgb 9.4>9.7 - Continue to monitor  FEN/GI: Renal Diet 1200cc fluid restrict >>> NPO + chips 2/6 o/n with NGT PPx: SCDs  Disposition: Continue HD per Renal. Cardiology following for AFib and dCHF, starting anticoagulation. Additionally developed gastric outlet obstruction treating with decompression, ordered abd CT with oral contrast, consulted GI for further work-up and possible EGD  Subjective: No acute complaints overnight. Denies shortness of breath. Excited to get NG tube out, which she was told may be tomorrow. Anxious for discharge. Feeling much better today after preacher and someone from her church choir came by yesterday. No further concerns today.  Objective: Temp:  [98.1 F (36.7 C)-99.4 F (37.4 C)] 99.4 F (37.4 C) (02/10 0417) Pulse Rate:  [76-85] 80 (02/10 0417) Resp:  [16-18] 17 (02/10 0417) BP: (109-126)/(43-76) 116/76 mmHg (02/10 0417) SpO2:  [96 %-100 %] 96 % (02/10 0417) FiO2 (%):  [32 %] 32 % (02/09 0815) Weight:  [179 lb (81.194 kg)] 179 lb (81.194 kg) (02/10 0500) Physical Exam: General: 74yo female resting in HD, somewhat uncomfortable with nausea, NAD HEENT - NGT in place (dark gastric  contents) Cardiovascular: RRR, no murmurs Respiratory: CTAB. No wheezes or focal crackles. [place] in  place. Abdomen: Soft, notably improved mild distended general abdomen with +hypoactive BS, non-tender on exam (improved), no rebound no guarding.  Extremities: No edema noted.  Laboratory:  Recent Labs Lab 01/15/15 0620 01/16/15 0730 01/17/15 0645  WBC 7.6 7.1 9.2  HGB 7.2* 7.0* 9.4*  HCT 22.5* 22.2* 29.0*  PLT 65* 60* 43*    Recent Labs Lab 01/13/15 1130 01/15/15 0620 01/16/15 0730 01/17/15 0645  NA  --  140 152* 141  K  --  4.1 2.4* 4.0  CL  --  103 114* 103  CO2  --  28 34* 28  BUN  --  34* <5* 25*  CREATININE  --  3.35* 0.41* 3.04*  CALCIUM  --  8.5 8.8 9.0  PROT 6.1  --   --   --   BILITOT 0.9  --   --   --   ALKPHOS 73  --   --   --   ALT 7  --   --   --   AST 19  --   --   --   GLUCOSE  --  123* 112* 77   Iron/TIBC/Ferritin/ %Sat    Component Value Date/Time   IRON 27* 01/10/2015 0741   TIBC 322 01/10/2015 0741   FERRITIN 37 01/10/2015 0741   IRONPCTSAT 8* 01/10/2015 0741   Urinalysis    Component Value Date/Time   COLORURINE YELLOW 01/10/2015 0116   APPEARANCEUR TURBID* 01/10/2015 0116   LABSPEC 1.025 01/10/2015 0116   PHURINE 7.0 01/10/2015 0116   GLUCOSEU NEGATIVE 01/10/2015 0116   HGBUR LARGE* 01/10/2015 0116   HGBUR trace-lysed 03/08/2010 1328   BILIRUBINUR NEGATIVE 01/10/2015 0116   KETONESUR NEGATIVE 01/10/2015 0116   PROTEINUR 100* 01/10/2015 0116   UROBILINOGEN 1.0 01/10/2015 0116   NITRITE NEGATIVE 01/10/2015 0116   LEUKOCYTESUR MODERATE* 01/10/2015 0116  - BNP 2273.8, unreliable due to CKD - Hepatitis B negative  Imaging/Diagnostic Tests: Ct Abdomen Wo Contrast  01/15/2015   CLINICAL DATA:  Initial evaluation for abdominal distention and nausea, gastric outlet obstruction  EXAM: CT ABDOMEN WITHOUT CONTRAST  TECHNIQUE: Multidetector CT imaging of the abdomen was performed following the standard protocol without IV contrast.  COMPARISON:  01/14/2015 radiograph  FINDINGS: An NG tube has been placed which extends into the stomach. There  is diffuse mild gastric wall thickening, with moderate wall thickening involving the proximal posterior gastric wall. No discrete mass at the level of the gastroduodenal junction, although mucosal thickening is noted in this area as well. Oral contrast extends out of the stomach and throughout much of the visualized small bowel.  Given limited evaluation without IV contrast, liver and spleen are normal. Mild increased attenuation in the gallbladder suggests the presence of sludge.  Adrenal glands are normal. Both kidneys appear atrophic. Calcification of the aortoiliac vessels noted.  No significant retroperitoneal or mesenteric adenopathy.  Significant increased attenuation in the subcutaneous soft tissues of both flanks. Moderate bilateral pleural effusions with underlying compressive and deep in did atelectasis. No significant pericardial effusion. No acute musculoskeletal findings.  IMPRESSION: In this patient with massive gastric gaseous distention on radiographs performed earlier this evening, an NG tube has been placed with the administration of oral contrast extending through the stomach and well into small bowel. There is diffuse gastric wall thickening, generally mild but more prominent in the proximal stomach. There is also mucosal thickening in the region of the  pylorus. The stomach is nondistended at this time. Gastric wall thickening could indicate the presence of gastritis or diffuse infiltrating neoplasm.  Bilateral soft tissue flank edema and bilateral pleural effusions.   Electronically Signed   By: Skipper Cliche M.D.   On: 01/15/2015 20:51   Dg Abd 2 Views  01/14/2015   CLINICAL DATA:  Abdominal distention and pain with emesis for 5 days.  EXAM: ABDOMEN - 2 VIEW  COMPARISON:  None.  FINDINGS: Marked gaseous distention of the stomach. Small bowel and colon appear normal in caliber. No definite free air.  IMPRESSION: Massive distention of the stomach, suggesting gastric outlet obstruction.    Electronically Signed   By: Lorin Picket M.D.   On: 01/14/2015 19:36   Renal US - no evidence of hydronephrosis  Lorna Few, DO 01/18/2015, 7:02 AM PGY-1, [place] Intern pager: [phone], text pages welcome

## 2015-01-18 NOTE — Procedures (Signed)
I was present at this dialysis session. I have reviewed the session itself and made appropriate changes.   Pt still with NGT to suction, bilious drainage.  K 2.9 today 2/2 this and no PO, change to 4K bath.  UF goal 1L,  BP ok.  Using [facility].    Pearson Grippe  MD 01/18/2015, 11:09 AM

## 2015-01-18 NOTE — Consult Note (Signed)
[place]  Telephone:(336) [place] NOTE  [name]                                MR#: [id number]  DOB: [birth year]                       CSN#: [id number]  Patient Care Team: Elberta Leatherwood, MD as PCP - General Richardson Landry, RN as Registered Nurse Cindee Salt, MD (Physical Medicine and Rehabilitation) Jamal Maes, MD (Nephrology) Jettie Booze, MD (Cardiology) Wyatt Portela, MD (Hematology and Oncology) Roseanne Kaufman, MD (Orthopedic Surgery) Hurman Horn, MD (Ophthalmology)  Referring MD: [facility]    Reason for Consult: Thrombocytopenia                                     History of MGUS  Kristi [name] 74 y.o. 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 In addition she was diagnosed with  Enterobacter UTI requiring antibiotics including Cipro and Keflex   On presentation, she complained of shortness of breath at rest and on exertion and increased abdominal pain, distention and nausea.  She was afebrile. She reported lower extremity edema, and generalized weakness. The patient denied increasing bone pain or pathological fracture during this period of time. She denies any bleeding issues such as epistaxis, hematemesis, hematuria or hematochezia. The patient denies history of liver disease, risk factors for HIV.Denies any ticks or other insect bites. Denies any history of cardiac murmur or prior cardiovascular surgery. Denies recent new  medications. Prior to admission she was on allopurinol and aspirin. She denies any NSAID use. She received Heparin on 2/2 through 01/13/15 and Coumadin on 2/5.  She has a prior history of DVT in 2009 for which she received Lovenox, without further recurrence.  Platelets on admission were  93,000. Upon heparinization her plts dropped to 42,000, HIT panel  is negative. Of note, her platelets as of 08/25/2012 had been 154,000. Urine was positive for 100 of protein, and large hemoglobin. PT is elevated at 26.5, INR is 2.42, and PTT is 29. No d-dimer is available for review. SPEP and UPEP with IFE pending. Smear has been ordered for review.  Of note, she was also noted to have a drop in her Hb. Admission hemoglobin was 10.2, and has been ranging between 9.4-9.7  with the exception of a drop around 01/16/2015, with a hemoglobin of 7.0 requiring transfusion. She received a transfusion of 2 units of pack rbc's on 01/16/1639 hemoglobin of 7 g. The patient receives Aranesp weekly and Ferric gluconate by the renal team. No bleeding issues are reported. Hemoccult is negative. Smear is available for review The patient has been evaluated by GI, due to abdominal distention on presentation, Partial SBO, requiring NG decompression in the setting of renal failure. CT of the  abdomen shows diffuse gastric wall thickening with pyloric thickening as well suspicious for gastritis versus diffuse infiltrating neoplasm. EGD is planned in the near future once cleared from risk of bleeding due to thrombocytopenia.   We were kindly asked to see the patient with recommendations  PMH:  Past Medical History  Diagnosis Date  . Hyperlipidemia   . Hyperparathyroidism   . CAD (coronary artery disease)   . Myocardial infarction 2008  . Hypertension   . COPD (chronic obstructive pulmonary disease)   . Diabetes mellitus     diagnosed 2010 - takes oral meds and lantus insulin  . Anemia   . Chronic kidney disease     esrd - since  2010  . GERD (gastroesophageal reflux disease)     on omeprazole  . Arthritis     all over  . CHF (congestive heart failure) 08-2011  . CVA 02/19/2010  . CAD 02/19/2010  . DEEP VENOUS THROMBOPHLEBITIS, LEG, LEFT 03/08/2010    in 2009 s/p hospitalization- only 1 isolated dvt  . Pulmonary artery hypertension 10/31/2014    Surgeries:  Past Surgical History  Procedure Laterality Date  . Av fistula placement  04/05/11    Left Radiocephalic AVF  . Appendectomy  1969  . Tubal ligation    . Revision of fisturl    . Coronary angioplasty with stent placement  06/23/2007    by Dr. Irish Lack is her cardiologist  . Ligation goretex fistula  10/29/2011    Procedure: LIGATION GORE-TEX FISTULA;  Surgeon: Elam Dutch, MD;  Location: [facility] OR;  Service: Vascular;  Laterality: Left;  Ligation of competing branches left forearm fistula  . Fistulogram N/A 02/17/2012    Procedure: FISTULOGRAM;  Surgeon: Angelia Mould, MD;  Location: [facility] CATH LAB;  Service: Cardiovascular;  Laterality: N/A;  . Insertion of dialysis catheter N/A 01/12/2015    Procedure: INSERTION OF DIALYSIS CATHETER;  Surgeon: Conrad Whalan, MD;  Location: [place] [facility];  Service: Vascular;  Laterality: N/A;    Allergies:  Allergies  Allergen Reactions  . Losartan Other (See Comments)    Causes increase in creatinine and worsening CKD  . Varenicline Nausea Only and Other (See Comments)    Dizzy and GI symptoms     Medications:    prior to admission:  Prescriptions prior to admission  Medication Sig Dispense Refill Last Dose  . acetaminophen (TYLENOL) 500 MG tablet Take 1,000 mg by mouth every 6 (six) hours as needed.   01/08/2015 at Unknown time  . acetaminophen-codeine (TYLENOL #3) 300-30 MG per tablet 1 tablet every 6 (six) hours as needed for moderate pain.    Past Week at Unknown time  . albuterol (PROVENTIL HFA) 108 (90 BASE) MCG/ACT inhaler Inhale 2 puffs into the lungs every 4 (four) hours as needed. For wheezing 8.5 g 2 Past  Week at Unknown time  . allopurinol (ZYLOPRIM) 100 MG tablet TAKE 2 TABLETS BY MOUTH EVERY DAY (Patient taking differently: TAKE 3 TABLETS BY MOUTH EVERY DAY) 60 tablet 3 01/09/2015 at Unknown time  . amLODipine (NORVASC) 5 MG tablet Take 1 tablet (5 mg total) by mouth daily. 90 tablet 3 01/09/2015 at Unknown time  . aspirin 325 MG tablet Take 325 mg by mouth at bedtime.    01/08/2015 at Unknown time  . [facility] ALLERGY RELIEF 180 MG tablet TAKE 1 TABLET BY MOUTH EVERY DAY (Patient taking differently: TAKE 1 TABLET BY MOUTH as needed for allergies) 90 tablet 3 Past Month at Unknown time  .  furosemide (LASIX) 80 MG tablet Take 1 tablet (80 mg total) by mouth daily. (Patient taking differently: Take 80-160 mg by mouth 2 (two) times daily. Takes 116m in am and 80 or 1628mepending in pm) 30 tablet 0 01/09/2015 at Unknown time  . glipiZIDE (GLUCOTROL XL) 5 MG 24 hr tablet Take 1 tablet (5 mg total) by mouth daily with breakfast. (Patient taking differently: Take 5 mg by mouth every evening. ) 30 tablet 11 01/08/2015 at Unknown time  . glucosamine-chondroitin 500-400 MG tablet Take 1 tablet by mouth 2 (two) times daily.     01/09/2015 at Unknown time  . HYDROcodone-acetaminophen (NORCO/VICODIN) 5-325 MG per tablet Take 1 tablet by mouth every 12 (twelve) hours as needed for moderate pain. 60 tablet 0 Past Week at Unknown time  . insulin glargine (LANTUS) 100 UNIT/ML injection Inject 0.13 mLs (13 Units total) into the skin every morning. If blood sugar is >140 add 1 unit. If blood sugar is <140 subtract 1 unit. 10 mL 2 01/09/2015 at Unknown time  . metoprolol succinate (TOPROL-XL) 25 MG 24 hr tablet Take 0.5 tablets (12.5 mg total) by mouth daily. 90 tablet 3 01/08/2015 at 2100  . mometasone-formoterol (DULERA) 100-5 MCG/ACT AERO Inhale 2 puffs into the lungs 2 (two) times daily. (Patient taking differently: Inhale 2 puffs into the lungs as needed for shortness of breath. ) 13 g 1 01/09/2015 at Unknown time  . nitroGLYCERIN  (NITROSTAT) 0.4 MG SL tablet Place 0.4 mg under the tongue every 5 (five) minutes as needed. For chest pain     Call 911 if pain not relieved by 3rd tab   not used  . NON FORMULARY Place 2 L into the nose continuous. 2 liters of o2   01/09/2015 at Unknown time  . Omega-3 Fatty Acids (FISH OIL) 1200 MG CAPS Take 2 capsules by mouth 2 (two) times daily.   01/09/2015 at Unknown time  . omeprazole (PRILOSEC) 20 MG capsule TAKE ONE CAPSULE BY MOUTH AT BEDTIME 90 capsule 3 01/08/2015 at Unknown time  . oxyCODONE-acetaminophen (PERCOCET/ROXICET) 5-325 MG per tablet Take 1 tablet by mouth 3 (three) times daily as needed for severe pain.   Past Week at Unknown time  . Polyethyl Glycol-Propyl Glycol (SYSTANE OP) Apply 1-2 drops to eye 4 (four) times daily as needed. For dry eyes   Past Week at Unknown time  . pravastatin (PRAVACHOL) 40 MG tablet Take 1 tablet (40 mg total) by mouth daily. (Patient taking differently: Take 40 mg by mouth every evening. ) 90 tablet 3 01/08/2015 at Unknown time  . Tiotropium Bromide Monohydrate (SPIRIVA RESPIMAT) 2.5 MCG/ACT AERS Inhale 2 puffs once daily (Patient taking differently: Inhale 2 puffs into the lungs as needed. Inhale 2 puffs once daily) 1 Inhaler 11 Past Week at Unknown time  . vitamin B-12 (CYANOCOBALAMIN) 1000 MCG tablet Take 1,000 mcg by mouth every morning.    01/09/2015 at Unknown time  . benzonatate (TESSALON) 100 MG capsule Take 1 capsule (100 mg total) by mouth 3 (three) times daily as needed for cough. 20 capsule 0 Taking  . doxycycline (VIBRA-TABS) 100 MG tablet Take 1 tablet (100 mg total) by mouth every 12 (twelve) hours. Starting tonight 7 tablet 0 Taking  . glucose blood (ACCU-CHEK SMARTVIEW) test strip 1 each by Other route 2 (two) times daily. Use as instructed 100 each 12   . Lancets (ACCU-CHEK SOFT TOUCH) lancets 1 each by Other route 2 (two) times daily. Use as instructed 100 each 12   .  predniSONE (DELTASONE) 50 MG tablet Take 1 tablet (50 mg total) by  mouth daily with breakfast. 1 tablet 0 Taking    . sodium chloride   Intravenous Once  . arformoterol  15 mcg Nebulization BID  . budesonide  0.5 mg Nebulization BID  . calcium acetate  667 mg Oral TID WC  . darbepoetin (ARANESP) injection - DIALYSIS  100 mcg Intravenous Q Mon-HD  . doxercalciferol  2 mcg Intravenous Q T,Th,Sa-HD  . ferric gluconate (FERRLECIT/NULECIT) IV  125 mg Intravenous Q M,W,F-HD  . insulin aspart  0-15 Units Subcutaneous TID WC  . insulin aspart  0-5 Units Subcutaneous QHS  . ipratropium-albuterol  3 mL Nebulization QID  . metoprolol  5 mg Intravenous Q12H  . multivitamin  1 tablet Oral QHS  . pantoprazole (PROTONIX) IV  40 mg Intravenous Q24H  . sodium chloride  3 mL Intravenous Q12H    MHD:QQIWLN chloride, sodium chloride, acetaminophen, benzonatate, feeding supplement (NEPRO CARB STEADY), morphine injection, ondansetron (ZOFRAN) IV, sodium chloride  ROS: Constitutional: Denies fevers, chills or abnormal night sweats Eyes: Denies blurriness of vision, double vision or watery eyes Ears, nose, mouth, throat, and face: Denies mucositis or sore throat Respiratory: Denies cough, dyspnea or wheezes Cardiovascular: Denies palpitation, chest discomfort or lower extremity swelling Gastrointestinal:  Denies nausea, heartburn or change in bowel habits Skin: Denies abnormal skin rashes Lymphatics: Denies new lymphadenopathy or easy bruising Neurological:Denies numbness, tingling or new weaknesses Behavioral/Psych: Mood is stable, no new changes  All other systems were reviewed with the patient and are negative.    Family History:    Family History  Problem Relation Age of Onset  . Cancer Mother     multiple myloma  . Dementia Father   . Pneumonia Father   . Anuerysm Father     No family history of hematological  disorders.  Social History:  reports that she quit smoking about 9 months ago. Her smoking use included Cigarettes. She started smoking about 54  years ago. She has a 26.5 pack-year smoking history. She has never used smokeless tobacco. She reports that she drinks alcohol. She reports that she does not use illicit drugs.  Physical Exam    ECOG PERFORMANCE STATUS:  Filed Vitals:   01/18/15 1030  BP: 112/49  Pulse: 78  Temp:   Resp:    Filed Weights   01/17/15 2035 01/18/15 0500 01/18/15 0952  Weight: 179 lb (81.194 kg) 179 lb (81.194 kg) 175 lb 4.3 oz (79.5 kg)    GENERAL:alert, no distress and comfortable SKIN: skin color, texture, turgor are normal, no rashes or significant lesions EYES: normal, conjunctiva are pink and non-injected, sclera clear OROPHARYNX:no exudate, no erythema and lips, buccal mucosa, and tongue normal .NG tube with dark material, but no bright red blood visible NECK: supple, thyroid normal size, non-tender, without nodularity LYMPH:  no palpable lymphadenopathy in the cervical, axillary or inguinal area LUNGS: clear to auscultation and percussion with normal breathing effort HEART: regular rate & rhythm and no murmurs and no lower extremity edema ABDOMEN: Soft, mildly distended, decreased bowel sounds diffusely, nontender Musculoskeletal:no cyanosis of digits and no clubbing  PSYCH: alert & oriented x 3 with fluent speech NEURO: no focal motor/sensory deficits   Labs:    Recent Labs Lab 01/14/15 0500 01/15/15 0620 01/16/15 0730 01/17/15 0645 01/18/15 0500  WBC 7.2 7.6 7.1 9.2 8.9  HGB 8.2* 7.2* 7.0* 9.4* 9.7*  HCT 25.7* 22.5* 22.2* 29.0* 30.1*  PLT 78* 65* 60* 43* 64*  MCV 89.2 90.7 93.7 89.2 89.9  MCH 28.5 29.0 29.5 28.9 29.0  MCHC 31.9 32.0 31.5 32.4 32.2  RDW 18.8* 19.1* 19.2* 20.7* 20.4*  LYMPHSABS  --   --   --   --  1.1  MONOABS  --   --   --   --  0.7  EOSABS  --   --   --   --  0.2  BASOSABS  --   --   --   --  0.1        Recent Labs Lab 01/12/15 1200 01/13/15 0538 01/13/15 1130 01/15/15 0620 01/16/15 0730 01/17/15 0645  NA 135 138  --  140 152* 141  K 3.6 3.6   --  4.1 2.4* 4.0  CL 98 100  --  103 114* 103  CO2 23 25  --  28 34* 28  GLUCOSE 168* 168*  --  123* 112* 77  BUN 80* 61*  --  34* <5* 25*  CREATININE 7.76* 6.20*  --  3.35* 0.41* 3.04*  CALCIUM 8.9 8.8  --  8.5 8.8 9.0  AST  --   --  19  --   --   --   ALT  --   --  7  --   --   --   ALKPHOS  --   --  73  --   --   --   BILITOT  --   --  0.9  --   --   --         Component Value Date/Time   BILITOT 0.9 01/13/2015 1130   BILITOT 0.40 08/25/2012 1033   BILIDIR 0.1 01/13/2015 1130   IBILI 0.8 01/13/2015 1130      Recent Labs Lab 01/14/15 1600 01/15/15 0620 01/16/15 0730 01/17/15 0645  INR 1.23 1.26 1.94* 2.42*    No results for input(s): DDIMER in the last 72 hours.      Results for EVELYNA, FOLKER (MRN [id number]) as of 08/25/2012 10:18  Ref. Range 02/14/2012 14:56  Albumin ELP Latest Range: 55.8-66.1 % 53.6 (L)  COMMENT (PROTEIN ELECTROPHOR) No range found *  Alpha-1-Globulin Latest Range: 2.9-4.9 % 5.7 (H)  Alpha-2-Globulin Latest Range: 7.1-11.8 % 13.4 (H)  Beta Globulin Latest Range: 4.7-7.2 % 6.5  Beta 2 Latest Range: 3.2-6.5 % 5.7  Gamma Globulin Latest Range: 11.1-18.8 % 15.1  M-SPIKE, % No range found 0.34  SPE Interp. No range found *  IgG (Immunoglobin G), Serum Latest Range: 701-750-3845 mg/dL 1070  IgA Latest Range: 69-380 mg/dL 330  IgM, Serum Latest Range: 52-322 mg/dL 249  Total Protein, serum electrophor Latest Range: 6.0-8.3 g/dL 6.6  Kappa free light chain Latest Range: 0.33-1.94 mg/dL 5.30 (H)          Anemia panel: Iron/TIBC/Ferritin/ %Sat    Component Value Date/Time   IRON 27* 01/10/2015 0741   TIBC 322 01/10/2015 0741   FERRITIN 37 01/10/2015 0741   IRONPCTSAT 8* 01/10/2015 0741    Recent Labs  01/17/15 1219  VITAMINB12 >2000*    Urinalysis    Component Value Date/Time   COLORURINE YELLOW 01/10/2015 0116   APPEARANCEUR TURBID* 01/10/2015 0116   LABSPEC 1.025 01/10/2015 0116    PHURINE 7.0 01/10/2015 0116   GLUCOSEU NEGATIVE 01/10/2015 0116   HGBUR LARGE* 01/10/2015 0116   HGBUR trace-lysed 03/08/2010 1328   BILIRUBINUR NEGATIVE 01/10/2015 0116   KETONESUR NEGATIVE 01/10/2015 0116   PROTEINUR 100* 01/10/2015 0116   UROBILINOGEN 1.0 01/10/2015 0116  NITRITE NEGATIVE 01/10/2015 0116   LEUKOCYTESUR MODERATE* 01/10/2015 0116    Drugs of Abuse  No results found for: LABOPIA, COCAINSCRNUR, LABBENZ, AMPHETMU, THCU, LABBARB   Imaging Studies:  Ct Abdomen Wo Contrast  01/15/2015   CLINICAL DATA:  Initial evaluation for abdominal distention and nausea, gastric outlet obstruction  EXAM: CT ABDOMEN WITHOUT CONTRAST  TECHNIQUE: Multidetector CT imaging of the abdomen was performed following the standard protocol without IV contrast.  COMPARISON:  01/14/2015 radiograph  FINDINGS: An NG tube has been placed which extends into the stomach. There is diffuse mild gastric wall thickening, with moderate wall thickening involving the proximal posterior gastric wall. No discrete mass at the level of the gastroduodenal junction, although mucosal thickening is noted in this area as well. Oral contrast extends out of the stomach and throughout much of the visualized small bowel.  Given limited evaluation without IV contrast, liver and spleen are normal. Mild increased attenuation in the gallbladder suggests the presence of sludge.  Adrenal glands are normal. Both kidneys appear atrophic. Calcification of the aortoiliac vessels noted.  No significant retroperitoneal or mesenteric adenopathy.  Significant increased attenuation in the subcutaneous soft tissues of both flanks. Moderate bilateral pleural effusions with underlying compressive and deep in did atelectasis. No significant pericardial effusion. No acute musculoskeletal findings.  IMPRESSION: In this patient with massive gastric gaseous distention on radiographs performed earlier this evening, an NG tube has been placed with the  administration of oral contrast extending through the stomach and well into small bowel. There is diffuse gastric wall thickening, generally mild but more prominent in the proximal stomach. There is also mucosal thickening in the region of the pylorus. The stomach is nondistended at this time. Gastric wall thickening could indicate the presence of gastritis or diffuse infiltrating neoplasm.  Bilateral soft tissue flank edema and bilateral pleural effusions.   Electronically Signed   By: Skipper Cliche M.D.   On: 01/15/2015 20:51   US Renal  01/09/2015   CLINICAL DATA:  Acute onset of renal failure.  Initial encounter.  EXAM: RENAL/URINARY TRACT ULTRASOUND COMPLETE  COMPARISON:  None.  FINDINGS: Right Kidney:  Length: 9.5 cm. Mildly increased parenchymal echogenicity. No mass or hydronephrosis visualized.  Left Kidney:  Length: 9.1 cm. Mildly increased parenchymal echogenicity. No mass or hydronephrosis visualized.  Bladder:  Appears normal for degree of bladder distention.  A small amount of ascites is noted about the liver and spleen.  IMPRESSION: 1. No evidence of hydronephrosis. 2. Mildly increased renal parenchymal echogenicity may reflect medical renal disease. 3. Small amount of ascites noted about the liver and spleen.   Electronically Signed   By: Garald Balding M.D.   On: 01/09/2015 18:57   Dg Chest Port 1 View  01/12/2015   CLINICAL DATA:  ESRD needing dialysis.  Dialysis catheter placement.  EXAM: PORTABLE CHEST - 1 VIEW  COMPARISON:  01/09/2015  FINDINGS: New split tip dialysis catheter from right IJ approach, tips in the right atrium.  Unchanged cardiomegaly and vascular pedicle widening. There is pulmonary venous congestion without overt edema. No effusion or pneumothorax.  IMPRESSION: 1. New right-sided dialysis catheter, tips in the right atrium. 2. Cardiomegaly and progressive venous congestion.   Electronically Signed   By: Jorje Guild M.D.   On: 01/12/2015 15:20   Dg Chest Portable 1  View  01/09/2015   CLINICAL DATA:  Shortness of breath and wheezing for 2 days.  EXAM: PORTABLE CHEST - 1 VIEW  COMPARISON:  PA and lateral  chest 10/30/2014 and 10/30/2012.  FINDINGS: There is marked cardiomegaly and vascular congestion. No consolidative process, pneumothorax or effusion is seen.  IMPRESSION: Cardiomegaly and pulmonary vascular congestion.   Electronically Signed   By: Inge Rise M.D.   On: 01/09/2015 16:26   Dg Abd 2 Views  01/14/2015   CLINICAL DATA:  Abdominal distention and pain with emesis for 5 days.  EXAM: ABDOMEN - 2 VIEW  COMPARISON:  None.  FINDINGS: Marked gaseous distention of the stomach. Small bowel and colon appear normal in caliber. No definite free air.  IMPRESSION: Massive distention of the stomach, suggesting gastric outlet obstruction.   Electronically Signed   By: Lorin Picket M.D.   On: 01/14/2015 19:36   Dg Fluoro Guide Cv Line-no Report  01/12/2015   CLINICAL DATA:    FLOURO GUIDE CV LINE  Fluoroscopy was utilized by the requesting physician.  No radiographic  interpretation.      A/P: 74 y.o. female with :    Thrombocytopenia: At this point to cause is unknown, however, this may be related to infection, antibiotics, dilution and  her history of MGUS In addition, GI malignancy may be a possibility, workup is in progress She was on aspirin daily prior to admission and allopurinol for the treatment of gout During this hospitalization  received Heparin on 2/2 through 01/13/15 and Coumadin on 2/5. Her HIT panel was negative No bleeding issues are reported to date. ANA is pending Vitamin B-12 was greater than 2000 Hepatitis B test  is negative.   IgG lambda monoclonal protein representing MGUS This was diagnosed by bone marrow biopsy in 2011, failing to show evidence of myeloma at the time. Skeletal survey in 2013 was negative for multiple myeloma as well Her SPEP in September 2013 had shown decreased along her M spike to 0.34 Since that time, the  patient failed to follow-up She has now developed worsening anemia, and worsening renal function, requiring hemodialysis.  We will await for the SPEP and UPEP with immunofixation report  Anemia In the setting of chronic disease, Iron deficiency,  Infection, Atrial fibrillation, CHF, Possible malignancy, antibiotics and dilution Admission hemoglobin was 10.2, and has been ranging between 9.4-9.7, with the exception of a drop around 01/16/2015, with a hemoglobin of 7.0 requiring transfusion. She received a transfusion of 2 units of pack rbc's on 01/16/1639 hemoglobin of 7 g. Continue to transfuse as needed The patient receives Aranesp weekly, 100 micrograms and Ferric gluconate by the renal team, Continue present therapy No bleeding issues are reported Hemoccult is negative Smear is available for review The patient has been evaluated by GI, due to abdominal distention on presentation,Partial SBO, requiring NG decompression in the setting of renal failure. CT of the abdomen shows diffuse gastric wall thickening with pyloric thickening as well suspicious for gastritis versus diffuse infiltrating neoplasm. EGD is planned in the near future once cleared from risk of bleeding due to thrombocytopenia.We will follow its results.   DVT prophylaxis The patient is on SCDs  Full code  Other medical issues Including atrial fibrillation and kidney failure now on ESRD,as per admitting team  **Disclaimer: This note was dictated with voice recognition software. Similar sounding words can inadvertently be transcribed and this note may contain transcription errors which may not have been corrected upon publication of note.Sharene Butters E, PA-C 01/18/2015 10:51 AM  Patient seen and examined personally at hemodialysis today. Patient has a long-standing history of monoclonal gammopathy of undetermined significance. She was last seen for this  issue in 2013. She was hospitalized this admission and developed  worsening renal failure that required hemodialysis. She was also noted to have thrombocytopenia with platelet count as low as 42,000 but seems to be improving at this time.  Clinically, she still ill-appearing with few complaints. His an NG tube in place. Heart was regular rate and rhythm and lungs are clear. Abdomen soft nontender without any hepatosplenomegaly. Could not appreciate any petechiae but she did have a large ecchymosis on her left arm.  Laboratory data were reviewed extensively and her peripheral smear was personally reviewed today. Her smear did not show any evidence of schistocytosis or red cell fragments. There is some evidence of polychromasia.   Assessment and plan:  #1. Thrombocytopenia: I feel this could be multifactorial in nature related to acute illness. I see no evidence to suggest HIT, DIC or TTP. Bone marrow infiltration with a plasma cell disorder would be unlikely to be causing this thrombocytopenia.  From a management standpoint, I would continue with supportive management at this time and I would avoid platelet transfusions unless she has active bleeding to be noted or platelet count of less than 20,000.  #2. History of monoclonal gammopathy: I agree with repeating SPEP and UPEP. Her previous M spike was less than 1 g/dL at baseline.  #3. Elevated PT: This is likely related to vitamin K deficiency. Due to her recent illness she probably has very poor by mouth intake and I agree with vitamin K supplementation. 10 mg subcutaneously or intravenously for 3 days should be adequate to do that.  I'll follow-up on her laboratory testing in the next 24-48 hours and we'll add further recommendations as a rise.

## 2015-01-19 ENCOUNTER — Inpatient Hospital Stay (HOSPITAL_COMMUNITY): Payer: Commercial Managed Care - HMO

## 2015-01-19 DIAGNOSIS — M79609 Pain in unspecified limb: Secondary | ICD-10-CM

## 2015-01-19 DIAGNOSIS — M7989 Other specified soft tissue disorders: Secondary | ICD-10-CM

## 2015-01-19 DIAGNOSIS — Z992 Dependence on renal dialysis: Secondary | ICD-10-CM | POA: Insufficient documentation

## 2015-01-19 DIAGNOSIS — R7989 Other specified abnormal findings of blood chemistry: Secondary | ICD-10-CM | POA: Insufficient documentation

## 2015-01-19 DIAGNOSIS — R600 Localized edema: Secondary | ICD-10-CM | POA: Insufficient documentation

## 2015-01-19 LAB — CBC
HCT: 34.2 % — ABNORMAL LOW (ref 36.0–46.0)
HEMOGLOBIN: 10.6 g/dL — AB (ref 12.0–15.0)
MCH: 28.9 pg (ref 26.0–34.0)
MCHC: 31 g/dL (ref 30.0–36.0)
MCV: 93.2 fL (ref 78.0–100.0)
PLATELETS: 54 10*3/uL — AB (ref 150–400)
RBC: 3.67 MIL/uL — AB (ref 3.87–5.11)
RDW: 20.4 % — ABNORMAL HIGH (ref 11.5–15.5)
WBC: 10.1 10*3/uL (ref 4.0–10.5)

## 2015-01-19 LAB — GLUCOSE, CAPILLARY
GLUCOSE-CAPILLARY: 117 mg/dL — AB (ref 70–99)
Glucose-Capillary: 133 mg/dL — ABNORMAL HIGH (ref 70–99)
Glucose-Capillary: 136 mg/dL — ABNORMAL HIGH (ref 70–99)
Glucose-Capillary: 113 mg/dL — ABNORMAL HIGH (ref 70–99)

## 2015-01-19 LAB — BASIC METABOLIC PANEL
Anion gap: 14 (ref 5–15)
BUN: 12 mg/dL (ref 6–23)
CO2: 26 mmol/L (ref 19–32)
CREATININE: 2.24 mg/dL — AB (ref 0.50–1.10)
Calcium: 8.9 mg/dL (ref 8.4–10.5)
Chloride: 101 mmol/L (ref 96–112)
GFR calc Af Amer: 24 mL/min — ABNORMAL LOW (ref >90)
GFR, EST NON AFRICAN AMERICAN: 20 mL/min — AB (ref >90)
Glucose, Bld: 146 mg/dL — ABNORMAL HIGH (ref 70–99)
Potassium: 3.5 mmol/L (ref 3.5–5.1)
SODIUM: 141 mmol/L (ref 135–145)

## 2015-01-19 LAB — PROTIME-INR
INR: 2.02 — ABNORMAL HIGH (ref 0.00–1.49)
INR: 1.57 — AB (ref 0.00–1.49)
PROTHROMBIN TIME: 18.9 s — AB (ref 11.6–15.2)
Prothrombin Time: 23.1 seconds — ABNORMAL HIGH (ref 11.6–15.2)

## 2015-01-19 LAB — ANA: Anti Nuclear Antibody(ANA): NEGATIVE

## 2015-01-19 MED ORDER — VITAMIN K1 10 MG/ML IJ SOLN
1.0000 mg | Freq: Once | INTRAMUSCULAR | Status: AC
  Administered 2015-01-19: 1 mg via INTRAVENOUS
  Filled 2015-01-19 (×2): qty 0.1

## 2015-01-19 MED ORDER — TECHNETIUM TO 99M ALBUMIN AGGREGATED
3.0000 | Freq: Once | INTRAVENOUS | Status: AC | PRN
  Administered 2015-01-19: 3 via INTRAVENOUS

## 2015-01-19 NOTE — Progress Notes (Signed)
Follow up:  Came by to check with the patient regarding [facility] services and the nurses were working with the patient at this time.  Will continue to follow.  Natividad Brood, RN, BSN, [facility] Liaison at [phone].

## 2015-01-19 NOTE — Progress Notes (Signed)
VASCULAR LAB PRELIMINARY  PRELIMINARY  PRELIMINARY  PRELIMINARY  Bilateral lower extremity venous Dopplers completed.    Preliminary report:  There is no obvious evidence of DVT or SVT noted in the bilateral lower extremities.   Keoni Havey, RVT 01/19/2015, 1:42 PM

## 2015-01-19 NOTE — Progress Notes (Signed)
Events noted overnight. Laboratory values were also reviewed as well.  Her DIC panel showed an elevated PTT of 86, PTT of 24.5 and elevated d-dimer. Fibrinogen however is 378 which within normal range. Her peripheral smear did not show any evidence of schistocytosis by my personal review. I still think DIC is less likely at this time and I think the elevation of PTT could be erroneous.  I have not seen the results of SPEP and a UPEP and if this has not been ordered pleased do so.  From a management standpoint, I would recommend continue supportive management as you are doing. I would transfuse platelets only of active brisk bleeding is noted for platelet count less than 50,000. I also would consider plasma transfusions only of active bleeding is noted.  I will await the results of the SPEP to make sure that we are not dealing with a plasma cell disorder. If her M spike is considerably higher than what it was 2013, and we can discuss a bone marrow biopsy at that time. 

## 2015-01-19 NOTE — Progress Notes (Signed)
Family Medicine Teaching Service Daily Progress Note Intern Pager: [phone]  Patient name: Kristi [name] Medical record number: [id number] Date of birth: 12/28/1940 Age: 74 y.o. Gender: female  Primary Care Provider: Alease Frame Marylynn Pearson, MD Consultants: CCM, Renal, Cardiology, GI Code Status: Full  Assessment and Plan: 74yo female with acute renal failure admitted for emergent HD and for symptomatic bradycardia. 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   - Potassium 3.5  # Atrial Fibrillation, New onset, questionable if paroxysmal - Controlled - On admission with symptomatic bradycardia (resolved s/p atropine x 1). Dx AFib evidenced on initial EKGs. ECHO with normal LV fxn. - Cardiology following - greatly appreciate recommendations - Continue Metoprolol 12.1m PO BID - Coumadin per pharm - chronic anticoagulation per Cards recs--Holding for Anticipated Endoscopy - Discontinue ASA on discharge  # Thrombocytopenia - Improved   - Platelet trend 64>49>54 - INR 2.42>2.02 - HIT panel 0.220 (normal) - Folate normal - Vitamin B12 >2000 - ANA negative - Peripheral Blood Smear- Polychromasia - Hold all heparin SQ and requested no heparin HD - Continue SCDs - Hematology consulted. Appreciate recommendations.  - Vitamin K 121m follow up INR this afternoon  - DIC Panel: PT 24.5, INR 2.19, PTT 86, D Dimer >20, Fibrinogen 378  - Follow up UPEP, SPEP. If M spike is elevated consider bone marrow biopsy. - Follow up VQ Scan and lower extremity dopplers to further evaluate elevated D Dimer   # Abd Pain / Distention / Nausea - Likely d/t to massive distended stomach w/ likely gastric outlet  obstruction - Diff dx includes duodenitis w/ stricture (known hx GERD) vs concern for obstructing malignancy vs. Electrolyte abnormality - Lipase 41, LFTs nml, Lactic acid 1.5 >> 1.2 - Identified on Abd X-ray 2/6, per radiology only partial obstruction - CT- diffuse gastric wall thickening, generally mild but more prominent in proximal stomach; mucosal thickening in region of the pylorus; could indicate gastritis or diffuse infiltrating neoplasm - Zofran PRN - NG Tube in place--Will clamp NG tube tomorrow, either with Endoscopy or as trial - Consult GI - (unassigned [facility]) - greatly appreciate recommendations for further work-up of partial gastric outlet obstruction. Concerned for malignancy and request consideration of EGD  - Most likely secondary to electrolyte abnormality from renal failure  - Continue NG tube  - No EGD until thrombocytopenia and elevated INR (goal <1.4) corrected, may do without biopsies if necessary  # Anemia- Hemoglobin 10.2 at admission - Hgb 9.4>9.7 - Continue to monitor  FEN/GI: Renal Diet 1200cc fluid restrict >>> NPO + chips 2/6 o/n with NGT PPx: SCDs  Disposition: Continue HD per Renal. Cardiology following for AFib and dCHF, starting anticoagulation. Additionally developed gastric outlet obstruction treating with decompression, ordered abd CT with oral contrast, consulted GI for further work-up and possible EGD  Subjective: No acute complaints overnight. No further complaints today. Anxious to go home.   Objective: Temp:  [98.1 F (36.7 C)-98.4 F (36.9 C)] 98.4 F (36.9 C) (02/10 2016) Pulse Rate:  [74-88] 88 (02/10 2016) Resp:  [15-18] 16 (02/10 2016) BP: (96-131)/(45-95) 116/53 mmHg (02/10 2016) SpO2:  [93 %-98 %] 93 % (02/10 2016) Weight:  [173 lb 4.5 oz (78.6 kg)-175 lb 4.3 oz (  79.5 kg)] 174 lb 1.9 oz (78.98 kg) (02/10 2016) Physical Exam: General: 74yo female resting in HD, somewhat uncomfortable with nausea, NAD HEENT - NGT in place (dark  gastric contents) Cardiovascular: RRR, no murmurs Respiratory: CTAB. No wheezes or focal crackles. [place] in place. Abdomen: Soft, notably improved mild distended general abdomen with +hypoactive BS, non-tender on exam (improved), no rebound no guarding.  Extremities: No edema noted.  Laboratory:  Recent Labs Lab 01/16/15 0730 01/17/15 0645 01/17/15 1219 01/18/15 0500 01/18/15 1525  WBC 7.1 9.2  --  8.9  --   HGB 7.0* 9.4*  --  9.7*  --   HCT 22.2* 29.0* 29.5* 30.1*  --   PLT 60* 43*  --  64* 49*    Recent Labs Lab 01/13/15 1130  01/16/15 0730 01/17/15 0645 01/18/15 0500  NA  --   < > 152* 141 145  K  --   < > 2.4* 4.0 2.9*  CL  --   < > 114* 103 104  CO2  --   < > 34* 28 28  BUN  --   < > <5* 25* 33*  CREATININE  --   < > 0.41* 3.04* 3.56*  CALCIUM  --   < > 8.8 9.0 9.3  PROT 6.1  --   --   --   --   BILITOT 0.9  --   --   --   --   ALKPHOS 73  --   --   --   --   ALT 7  --   --   --   --   AST 19  --   --   --   --   GLUCOSE  --   < > 112* 77 95  < > = values in this interval not displayed. Iron/TIBC/Ferritin/ %Sat    Component Value Date/Time   IRON 27* 01/10/2015 0741   TIBC 322 01/10/2015 0741   FERRITIN 37 01/10/2015 0741   IRONPCTSAT 8* 01/10/2015 0741   Urinalysis    Component Value Date/Time   COLORURINE YELLOW 01/10/2015 0116   APPEARANCEUR TURBID* 01/10/2015 0116   LABSPEC 1.025 01/10/2015 0116   PHURINE 7.0 01/10/2015 0116   GLUCOSEU NEGATIVE 01/10/2015 0116   HGBUR LARGE* 01/10/2015 0116   HGBUR trace-lysed 03/08/2010 1328   BILIRUBINUR NEGATIVE 01/10/2015 0116   KETONESUR NEGATIVE 01/10/2015 0116   PROTEINUR 100* 01/10/2015 0116   UROBILINOGEN 1.0 01/10/2015 0116   NITRITE NEGATIVE 01/10/2015 0116   LEUKOCYTESUR MODERATE* 01/10/2015 0116  - BNP 2273.8, unreliable due to CKD - Hepatitis B negative  Imaging/Diagnostic Tests: Ct Abdomen Wo Contrast  01/15/2015   CLINICAL DATA:  Initial evaluation for abdominal distention and nausea, gastric  outlet obstruction  EXAM: CT ABDOMEN WITHOUT CONTRAST  TECHNIQUE: Multidetector CT imaging of the abdomen was performed following the standard protocol without IV contrast.  COMPARISON:  01/14/2015 radiograph  FINDINGS: An NG tube has been placed which extends into the stomach. There is diffuse mild gastric wall thickening, with moderate wall thickening involving the proximal posterior gastric wall. No discrete mass at the level of the gastroduodenal junction, although mucosal thickening is noted in this area as well. Oral contrast extends out of the stomach and throughout much of the visualized small bowel.  Given limited evaluation without IV contrast, liver and spleen are normal. Mild increased attenuation in the gallbladder suggests the presence of sludge.  Adrenal glands are normal. Both kidneys appear atrophic. Calcification of the aortoiliac vessels  noted.  No significant retroperitoneal or mesenteric adenopathy.  Significant increased attenuation in the subcutaneous soft tissues of both flanks. Moderate bilateral pleural effusions with underlying compressive and deep in did atelectasis. No significant pericardial effusion. No acute musculoskeletal findings.  IMPRESSION: In this patient with massive gastric gaseous distention on radiographs performed earlier this evening, an NG tube has been placed with the administration of oral contrast extending through the stomach and well into small bowel. There is diffuse gastric wall thickening, generally mild but more prominent in the proximal stomach. There is also mucosal thickening in the region of the pylorus. The stomach is nondistended at this time. Gastric wall thickening could indicate the presence of gastritis or diffuse infiltrating neoplasm.  Bilateral soft tissue flank edema and bilateral pleural effusions.   Electronically Signed   By: Skipper Cliche M.D.   On: 01/15/2015 20:51   Renal US - no evidence of hydronephrosis  Lorna Few,  DO 01/19/2015, 5:42 AM PGY-1, [place] Intern pager: [phone], text pages welcome

## 2015-01-19 NOTE — Clinical Social Work Note (Signed)
CSW received consult for skilled facility placement for short-term rehab for patient. Assessment completed and note to follow. Patient in agreement to [facility] rehab.  Tu Bayle Givens, MSW, LCSW Licensed Clinical Social Worker [place] [phone]

## 2015-01-19 NOTE — Progress Notes (Signed)
Physical Therapy Treatment Patient Details Name: Kristi [name] MRN: [id number] DOB: December 24, 1940 Today's Date: 01/19/2015    History of Present Illness Pt is a 74 y.o. F with PMH as outlined below. She presented to [facility] ED 2/1 with generalized weakness and SOB. She has hx of chronic renal insufficiency (stage IV CKD) but has never required dialysis. She does however have a left wrist AV fistula which has never been accessed before. She states she has been feeling weak for quite some time now, for the past few weeks but has never seeked medical attention. On day of presentation, she felt much worse than before and due to worsening SOB over past 3 days, decided to come to ED for further evaluation.    PT Comments    Pt progressing slowly towards physical therapy goals. Pt reports increased pain in throat, LE's, and low back, as well as in bilateral feet with weight bearing. Because of this, gait training was limited to a few steps from bed to chair. Pt was able to tolerate minimal therapeutic exercise after transfer to chair. Overall very fatigued and painful this session. Will continue to follow and progress as able per POC.   Follow Up Recommendations  SNF;Supervision/Assistance - 24 hour     Equipment Recommendations  None recommended by PT    Recommendations for Other Services       Precautions / Restrictions Precautions Precautions: Fall Restrictions Weight Bearing Restrictions: No    Mobility  Bed Mobility Overal bed mobility: Needs Assistance Bed Mobility: Supine to Sit     Supine to sit: Supervision     General bed mobility comments: HOB elevated; supervision for safety. Pt moving very slow and guarded due to NG tube.   Transfers Overall transfer level: Needs assistance Equipment used: Rolling walker (2 wheeled) Transfers: Sit to/from [facility] Sit to Stand: Max assist Stand pivot transfers: Min assist       General transfer comment: Pt required  max assist to power-up to standing from EOB. Very flexed in her trunk overall, and most support was given with gait belt. Pt reports pain mostly in her feet with weight bearing, and also in bilateral LE's, and low back.   Ambulation/Gait             General Gait Details: Unable to tolerate gait training at this time due to pain in her feet. .   Stairs            Wheelchair Mobility    Modified Rankin (Stroke Patients Only)       Balance Overall balance assessment: Needs assistance Sitting-balance support: Feet supported;No upper extremity supported Sitting balance-Leahy Scale: Fair     Standing balance support: Bilateral upper extremity supported;During functional activity Standing balance-Leahy Scale: Poor                      Cognition Arousal/Alertness: Awake/alert Behavior During Therapy: Anxious Overall Cognitive Status: Within Functional Limits for tasks assessed                      Exercises General Exercises - Lower Extremity Long Arc Quad: 15 reps    General Comments        Pertinent Vitals/Pain Pain Assessment: No/denies pain (Discomfort with NG tube reported but no pain)    Home Living                      Prior Function  PT Goals (current goals can now be found in the care plan section) Acute Rehab PT Goals Patient Stated Goal: Return home after rehab PT Goal Formulation: With patient Time For Goal Achievement: 01/31/15 Potential to Achieve Goals: Good Progress towards PT goals: Progressing toward goals    Frequency  Min 2X/week    PT Plan Current plan remains appropriate    Co-evaluation             End of Session Equipment Utilized During Treatment: Gait belt Activity Tolerance: Patient tolerated treatment well Patient left: in chair;with chair alarm set;with call bell/phone within reach     Time: 0811-0843 PT Time Calculation (min) (ACUTE ONLY): 32 min  Charges:  $Gait  Training: 8-22 mins $Therapeutic Activity: 8-22 mins                    G Codes:      Rolinda Roan 02/08/15, 8:56 AM  Rolinda Roan, PT, DPT Acute Rehabilitation Services Pager: [phone]

## 2015-01-19 NOTE — Progress Notes (Signed)
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 sodium chloride   Intravenous Once  . arformoterol  15 mcg Nebulization BID  . budesonide  0.5 mg Nebulization BID  . calcium acetate  667 mg Oral TID WC  . darbepoetin (ARANESP) injection - DIALYSIS  100 mcg Intravenous Q Mon-HD  . doxercalciferol  2 mcg Intravenous Q T,Th,Sa-HD  . ferric gluconate (FERRLECIT/NULECIT) IV  125 mg Intravenous Q M,W,F-HD  . insulin aspart  0-15 Units Subcutaneous TID WC  . insulin aspart  0-5 Units Subcutaneous QHS  . ipratropium-albuterol  3 mL Nebulization QID  . metoprolol  5 mg Intravenous Q12H  . multivitamin  1 tablet Oral QHS  . pantoprazole (PROTONIX) IV  40 mg Intravenous Q24H  . sodium chloride  3 mL Intravenous Q12H   Continuous Infusions: . dextrose 5 % and 0.45% NaCl 75 mL/hr at 01/19/15 0224   PRN Meds:.sodium chloride, sodium chloride, acetaminophen, benzonatate, feeding supplement (NEPRO CARB STEADY), morphine injection, ondansetron (ZOFRAN) IV, sodium chloride  Current Labs: reviewed    Physical Exam:  Blood pressure 117/42, pulse 82, temperature 97.7 F (36.5 C), temperature source Oral, resp. rate 20, height 5' (1.524 m), weight 78.98 kg (174 lb 1.9 oz), SpO2 98 %. NAD IRIR, no rub Diminished in bases No LEE +B/T of L RC AVF Extensive aging brusing of distal LUE Nonfocal  A/P 1. New ESRD:  1. cont on MWF schedule 2. CLIP ongoing 2. N/V, gastric distension 1. Decompressed 2. NGT clamped 3. GI following 3. Hyperkalemia: resolved; now hypokalemia needing 4K bath with HD (GI losses) 4. Vol O/L / Pulm HTN: stable volume  status 5. Anemia:  1. s/p 2u PRBC, appropriate increase 01/16/15 2. On ESA and IV Fe 6. Atrial Fibrillation on warfarin 7. 2HPTH: cont VDRA and PhosLo  Pearson Grippe MD 01/19/2015, 2:14 PM   Recent Labs Lab 01/16/15 0730 01/17/15 0645 01/18/15 0500 01/19/15 0952  NA 152* 141 145 141  K 2.4* 4.0 2.9* 3.5  CL 114* 103 104 101  CO2 34* 28 28 26   GLUCOSE 112* 77 95 146*  BUN <5* 25* 33* 12  CREATININE 0.41* 3.04* 3.56* 2.24*  CALCIUM 8.8 9.0 9.3 8.9  PHOS <1.0*  --  4.2  --     Recent Labs Lab 01/17/15 0645 01/17/15 1219 01/18/15 0500 01/18/15 1525 01/19/15 0952  WBC 9.2  --  8.9  --  10.1  NEUTROABS  --   --  6.9  --   --   HGB 9.4*  --  9.7*  --  10.6*  HCT 29.0* 29.5* 30.1*  --  34.2*  MCV 89.2  --  89.9  --  93.2  PLT 43*  --  64* 49* 54*

## 2015-01-20 ENCOUNTER — Encounter (HOSPITAL_COMMUNITY)
Admission: EM | Disposition: A | Discharge status: Skilled Nursing Facility | Payer: Self-pay | Source: Home / Self Care | Attending: Family Medicine

## 2015-01-20 ENCOUNTER — Encounter (HOSPITAL_COMMUNITY): Payer: Self-pay | Admitting: *Deleted

## 2015-01-20 DIAGNOSIS — R110 Nausea: Secondary | ICD-10-CM

## 2015-01-20 HISTORY — PX: ESOPHAGOGASTRODUODENOSCOPY: SHX5428

## 2015-01-20 LAB — POCT I-STAT, CHEM 8
CALCIUM ION: 1.09 mmol/L — AB (ref 1.13–1.30)
CREATININE: 0.6 mg/dL (ref 0.50–1.10)
Chloride: 97 mmol/L (ref 96–112)
Glucose, Bld: 115 mg/dL — ABNORMAL HIGH (ref 70–99)
HCT: 34 % — ABNORMAL LOW (ref 36.0–46.0)
Hemoglobin: 11.6 g/dL — ABNORMAL LOW (ref 12.0–15.0)
Potassium: 3.5 mmol/L (ref 3.5–5.1)
SODIUM: 138 mmol/L (ref 135–145)
TCO2: 24 mmol/L (ref 0–100)

## 2015-01-20 LAB — BASIC METABOLIC PANEL
ANION GAP: 8 (ref 5–15)
BUN: 15 mg/dL (ref 6–23)
CALCIUM: 8.4 mg/dL (ref 8.4–10.5)
CO2: 30 mmol/L (ref 19–32)
CREATININE: 2.38 mg/dL — AB (ref 0.50–1.10)
Chloride: 98 mmol/L (ref 96–112)
GFR, EST AFRICAN AMERICAN: 22 mL/min — AB (ref >90)
GFR, EST NON AFRICAN AMERICAN: 19 mL/min — AB (ref >90)
Glucose, Bld: 150 mg/dL — ABNORMAL HIGH (ref 70–99)
Potassium: 2.6 mmol/L — CL (ref 3.5–5.1)
SODIUM: 136 mmol/L (ref 135–145)

## 2015-01-20 LAB — CBC
HCT: 29.7 % — ABNORMAL LOW (ref 36.0–46.0)
Hemoglobin: 9.6 g/dL — ABNORMAL LOW (ref 12.0–15.0)
MCH: 28.9 pg (ref 26.0–34.0)
MCHC: 32.3 g/dL (ref 30.0–36.0)
MCV: 89.5 fL (ref 78.0–100.0)
Platelets: 63 10*3/uL — ABNORMAL LOW (ref 150–400)
RBC: 3.32 MIL/uL — AB (ref 3.87–5.11)
RDW: 20 % — ABNORMAL HIGH (ref 11.5–15.5)
WBC: 10.2 10*3/uL (ref 4.0–10.5)

## 2015-01-20 LAB — PROTIME-INR
INR: 1.35 (ref 0.00–1.49)
PROTHROMBIN TIME: 16.8 s — AB (ref 11.6–15.2)

## 2015-01-20 LAB — GLUCOSE, CAPILLARY
GLUCOSE-CAPILLARY: 82 mg/dL (ref 70–99)
Glucose-Capillary: 142 mg/dL — ABNORMAL HIGH (ref 70–99)
Glucose-Capillary: 142 mg/dL — ABNORMAL HIGH (ref 70–99)

## 2015-01-20 SURGERY — EGD (ESOPHAGOGASTRODUODENOSCOPY)
Anesthesia: Moderate Sedation

## 2015-01-20 MED ORDER — WARFARIN SODIUM 5 MG PO TABS
5.0000 mg | ORAL_TABLET | Freq: Once | ORAL | Status: AC
  Administered 2015-01-20: 5 mg via ORAL
  Filled 2015-01-20: qty 1

## 2015-01-20 MED ORDER — MORPHINE SULFATE 2 MG/ML IJ SOLN
INTRAMUSCULAR | Status: AC
  Filled 2015-01-20: qty 1

## 2015-01-20 MED ORDER — DOXERCALCIFEROL 4 MCG/2ML IV SOLN
INTRAVENOUS | Status: AC
  Filled 2015-01-20: qty 2

## 2015-01-20 MED ORDER — MIDAZOLAM HCL 10 MG/2ML IJ SOLN
INTRAMUSCULAR | Status: DC | PRN
  Administered 2015-01-20: 2 mg via INTRAVENOUS

## 2015-01-20 MED ORDER — SODIUM CHLORIDE 0.9 % IV SOLN: INTRAVENOUS | Status: DC

## 2015-01-20 MED ORDER — BUTAMBEN-TETRACAINE-BENZOCAINE 2-2-14 % EX AERO
INHALATION_SPRAY | CUTANEOUS | Status: DC | PRN
  Administered 2015-01-20: 2 via TOPICAL

## 2015-01-20 MED ORDER — MIDAZOLAM HCL 5 MG/ML IJ SOLN
INTRAMUSCULAR | Status: AC
Start: 2015-01-20 — End: 2015-01-20
  Filled 2015-01-20: qty 2

## 2015-01-20 MED ORDER — WARFARIN - PHARMACIST DOSING INPATIENT: Freq: Every day | Status: DC

## 2015-01-20 MED ORDER — FENTANYL CITRATE 0.05 MG/ML IJ SOLN
INTRAMUSCULAR | Status: AC
  Filled 2015-01-20: qty 2

## 2015-01-20 MED ORDER — DIPHENHYDRAMINE HCL 50 MG/ML IJ SOLN
INTRAMUSCULAR | Status: AC
  Filled 2015-01-20: qty 1

## 2015-01-20 MED ORDER — FENTANYL CITRATE 0.05 MG/ML IJ SOLN
INTRAMUSCULAR | Status: DC | PRN
  Administered 2015-01-20 (×2): 12.5 ug via INTRAVENOUS

## 2015-01-20 NOTE — Clinical Social Work Placement (Addendum)
Clinical Social Work Department CLINICAL SOCIAL WORK PLACEMENT NOTE 01/20/2015  Patient:  Kristi [name], Kristi [name]  Account Number:  [id number] Admit date:  01/09/2015  Clinical Social Worker:  Shane Badeaux Givens, LCSW  Date/time:  01/20/2015 06:09 AM  Clinical Social Work is seeking post-discharge placement for this patient at the following level of care:   SKILLED NURSING   (*CSW will update this form in Epic as items are completed)   01/19/2015  Patient/family provided with [facility] Department of Clinical Social Work's list of facilities offering this level of care within the geographic area requested by the patient (or if unable, by the patient's family).  01/19/2015  Patient/family informed of their freedom to choose among providers that offer the needed level of care, that participate in Medicare, Medicaid or managed care program needed by the patient, have an available bed and are willing to accept the patient.    Patient/family informed of [facility]' ownership interest in [facility], as well as of the fact that they are under no obligation to receive care at this facility.  PASARR submitted to EDS on 01/20/2015 PASARR number received on 01/20/2015  FL2 transmitted to all facilities in geographic area requested by pt/family on  01/19/2015 FL2 transmitted to all facilities within larger geographic area on   Patient informed that his/her managed care company has contracts with or will negotiate with  certain facilities, including the following:     Patient/family informed of bed offers received: 01/23/15   Patient chooses bed at [facility] on 01/24/15 Physician recommends and patient chooses bed at    Patient to be transferred to [facility] on 01/25/15   Patient to be transferred to facility by ambulance Patient and family notified of transfer on 01/25/15 Name of family member notified: Patient contacted her daughter who lives in [place], [place] while [place] in room regarding  discharge and information needed for [facility]'s admissions paperwork.   The following physician request were entered in Epic:  Additional Comments:       Austin Herd Givens, MSW, LCSW Licensed Clinical Social Worker [place] [phone]

## 2015-01-20 NOTE — Progress Notes (Signed)
Events noted and laboratory data reviewed. Her platelet counts have slowly improved up to 63. Her white cell count remained stable and hemoglobin of 9.6. Her INR have corrected appropriately. Her serum protein electrophoresis still pending.  For the time being, I recommended continuing supportive measures as you are doing. I see no need for any transfusions of plasma or platelets. No active bleeding noted at this time.  We will await the results of her serum protein electrophoresis to determine whether she needs a bone marrow biopsy or not. Palliative medicine services are also involved in discussion with the patient regarding long-term hemodialysis.  I will evaluate the patient again on Monday 01/23/2015. In the meantime please call with any questions over the weekend.

## 2015-01-20 NOTE — Procedures (Signed)
I was present at this dialysis session. I have reviewed the session itself and made appropriate changes.   HD with no UF.  K is 2.6.  Using 4K bath and will check K at end of Tx to see if needs further supplementation  Explored overall goals and direction of her care.  She is unsure if long term HD is what she wishes.  Will ask palliative medicine to discus further.    Pearson Grippe  MD 01/20/2015, 8:45 AM

## 2015-01-20 NOTE — Clinical Social Work Psychosocial (Signed)
Clinical Social Work Department BRIEF PSYCHOSOCIAL ASSESSMENT 01/20/2015  Patient:  Kristi [name], Kristi [name]     Account Number:  [id number]     Admit date:  01/09/2015  Clinical Social Worker:  Frederico Hamman  Date/Time:  01/20/2015 06:03 AM  Referred by:  Physician  Date Referred:  01/19/2015 Referred for  SNF Placement   Other Referral:   Interview type:  Patient Other interview type:    PSYCHOSOCIAL DATA Living Status:  ALONE Admitted from facility:   Level of care:   Primary support name:   Primary support relationship to patient:   Degree of support available:   Patient reported that she 2 daughters that live in [place], [place] and [place], [place]. Her son is deceased. Patient reported that her support system in [place] is her friends.    CURRENT CONCERNS Current Concerns  Post-Acute Placement   Other Concerns:    SOCIAL WORK ASSESSMENT / PLAN On 01/19/15 CSW talked with patient about recommendation of ST rehab. Patient was alert, oriented and engaged easily with CSW. When asked, Ms. Galati responded that she has never been to a facility for ST rehab and she did not have a facility preference. Patient in agreement with rehab and accepted SNF list.   Assessment/plan status:  Psychosocial Support/Ongoing Assessment of Needs Other assessment/ plan:   Information/referral to community resources:   Patient given SNF list for [facility]    PATIENT'S/FAMILY'S RESPONSE TO PLAN OF CARE: Patient receptive to talking with CSW and in agreement with rehab once discharged.       Sadiq Mccauley Givens, MSW, LCSW Licensed Clinical Social Worker [place] [phone]

## 2015-01-20 NOTE — Care Management Note (Signed)
CARE MANAGEMENT NOTE 01/20/2015  Patient:  Kristi [name], Kristi [name]   Account Number:  [id number]  Date Initiated:  01/10/2015  Documentation initiated by:  [facility]  Subjective/Objective Assessment:   Admitted with renal failure     Action/Plan:   CM following for progression and d/c planning.   Anticipated DC Date:  01/24/2015   Anticipated DC Plan:  [place]  CM consult      Choice offered to / List presented to:             Status of service:  In process, will continue to follow Medicare Important Message given?  YES (If response is "NO", the following Medicare IM given date fields will be blank) Date Medicare IM given:  01/20/2015 Medicare IM given by:  Abyan Cadman Date Additional Medicare IM given:   Additional Medicare IM given by:    Discharge Disposition:    Per UR Regulation:  Reviewed for med. necessity/level of care/duration of stay  If discussed at [place] of Stay Meetings, dates discussed:    Comments:  ContactLoyal Buba Daughter Alice Daughter   [phone]

## 2015-01-20 NOTE — Progress Notes (Signed)
ANTICOAGULATION CONSULT NOTE - Follow Up Consult  Pharmacy Consult for coumadin Indication: atrial fibrillation  Allergies  Allergen Reactions  . Losartan Other (See Comments)    Causes increase in creatinine and worsening CKD  . Chantix [Varenicline] Nausea Only and Other (See Comments)    Dizzy and GI symptoms     Patient Measurements: Height: 5' (152.4 cm) Weight: 177 lb 0.5 oz (80.3 kg) IBW/kg (Calculated) : 45.5   Vital Signs: Temp: 98.1 F (36.7 C) (02/12 1639) Temp Source: Oral (02/12 1639) BP: 106/49 mmHg (02/12 1639) Pulse Rate: 75 (02/12 1639)  Labs:  Recent Labs  01/18/15 1525 01/19/15 0952 01/19/15 1726 01/20/15 0500 01/20/15 1144  HGB  --  10.6*  --  9.6* 11.6*  HCT  --  34.2*  --  29.7* 34.0*  PLT 49* 54*  --  63*  --   APTT 86*  --   --   --   --   LABPROT 24.5* 23.1* 18.9* 16.8*  --   INR 2.19* 2.02* 1.57* 1.35  --   CREATININE  --  2.24*  --  2.38* 0.60    Estimated Creatinine Clearance: 57.9 mL/min (by C-G formula based on Cr of 0.6).  Assessment: Patient is a 74 y.o F on coumadin for afib.  INR responded quickly after 2 x 5mg  doses then was reversed with Vit K for EGD.  Pt is s/p EGD today.  INR 1.35.  To restart Coumadin.    Goal of Therapy:  INR 2-3  Plan:  Coumadin 5mg  PO x 1 tonight. Will likely need lower Coumadin dose tomorrow based on patient's previous response. Continue daily INR.  [employer], Pharm.D., BCPS Clinical Pharmacist Pager [phone] 01/20/2015 5:14 PM

## 2015-01-20 NOTE — Op Note (Signed)
[facility] [place], [address]   ENDOSCOPY PROCEDURE REPORT  PATIENT: Maribella, Kuna  MR#: [id number] BIRTHDATE: [birth year] , 73  yrs. old GENDER: female ENDOSCOPIST: Acquanetta Sit, MD REFERRED BY: PROCEDURE DATE:  01/24/15 PROCEDURE:  EGD ASA CLASS:     3 INDICATIONS:  gastric dilatation rule out obstruction versus other gastric lesions MEDICATIONS: fentanyl 37.5 g IV, Versed 3 mg IV TOPICAL ANESTHETIC:  DESCRIPTION OF PROCEDURE: After the risks benefits and alternatives of the procedure were thoroughly explained, informed consent was obtained.  The Pentax Gastroscope [id number] endoscope was introduced through the mouth and advanced to the second portion of the duodenum , Without limitations.  The instrument was slowly withdrawn as the mucosa was fully examined.  Findings:  Esophagus: Normal  Stomach: Normal other than scattered NG suction marks causing these areas to have some gastritis  Duodenum: Normal  There is no evidence on this examination of obstruction or masses.    The scope was then withdrawn from the patient and the procedure completed.  COMPLICATIONS: There were no immediate complications.  ENDOSCOPIC IMPRESSION:normal EGD with the exception of NG suction trauma   RECOMMENDATIONS:I recommend leaving the NG tube out now. It was removed in endoscopy. I would keep her nothing by mouth today and see how she does, give her clear liquids tomorrow and advance her diet as tolerated. There is no evidence of obstruction. We will sign off. Call if if needed.   REPEAT EXAM:  eSignedAcquanetta Sit, MD Jan 24, 2015 2:21 PM    CC:  CPT CODES: ICD CODES:  The ICD and CPT codes recommended by this software are interpretations from the data that the clinical staff has captured with the software.  The verification of the translation of this report to the ICD and CPT codes and modifiers is the sole responsibility of  the health care institution and practicing physician where this report was generated.  [employer] will not be held responsible for the validity of the ICD and CPT codes included on this report.  AMA assumes no liability for data contained or not contained herein. CPT is a Designer, television/film set of the [employer].  PATIENT NAME:  Stephanieann, Popescu MR#: [id number]

## 2015-01-20 NOTE — Progress Notes (Signed)
Family Medicine Teaching Service Daily Progress Note Intern Pager: [phone]  Patient name: Kristi [name] Medical record number: [id number] Date of birth: 01/01/1941 Age: 74 y.o. Gender: female  Primary Care Provider: Alease Frame Marylynn Pearson, MD Consultants: CCM, Renal, Cardiology, GI Code Status: Full  Assessment and Plan: 74yo female with acute renal failure admitted for emergent HD and for symptomatic bradycardia. PMH includes hyperlipidemia, hyperparathyroidism, CAD, MI (2008), HTN, COPD, DM, Anemia, CKD, GERD, Arthritis, CHF, CVA, DVT, and PAH  # Acute on Chronic Kidney Disease, considered new ESRD - Followed by Renal - greatly appreciate recommendations - Received HD on 2/1, 2/2, 2/4, 2/6, 2/8, 2/10--Dialysis today (MWF Schedule) - Creatinine 3.56>>2.24>2.38>0.60, improved from 11.8 at admission.   - Potassium 2.6>3.5  # Atrial Fibrillation, New onset, questionable if paroxysmal - Controlled - On admission with symptomatic bradycardia (resolved s/p atropine x 1). Dx AFib evidenced on initial EKGs. ECHO with normal LV fxn. - Cardiology following - greatly appreciate recommendations - Continue Metoprolol 12.72m PO BID - Coumadin per pharm - chronic anticoagulation per Cards recs--Holding for Anticipated Endoscopy - Discontinue ASA on discharge  # Thrombocytopenia - Improved   - Platelet trend 64>49>54>63 - INR 2.42>2.02>1.57>1.35 - HIT panel 0.220 (normal) - Folate normal - Vitamin B12 >2000 - ANA negative - Peripheral Blood Smear- Polychromasia - Hold all heparin SQ and requested no heparin HD - Continue SCDs - Hematology consulted. Appreciate recommendations.  - Vitamin K 123mon 2/11  - DIC Panel: PT 24.5, INR 2.19, PTT 86, D Dimer >20, Fibrinogen 378  - Follow up UPEP, SPEP. If M spike is elevated consider bone marrow biopsy. - VQ scan showed no PE, Dopplers no sign of DVT   # Abd Pain / Distention / Nausea - Likely d/t to massive distended stomach w/ likely gastric outlet  obstruction - Diff dx includes duodenitis w/ stricture (known hx GERD) vs concern for obstructing malignancy vs. Electrolyte abnormality - Lipase 41, LFTs nml, Lactic acid 1.5 >> 1.2 - Identified on Abd X-ray 2/6, per radiology only partial obstruction - CT- diffuse gastric wall thickening, generally mild but more prominent in proximal stomach; mucosal thickening in region of the pylorus; could indicate gastritis or diffuse infiltrating neoplasm - Zofran PRN - NG Tube in place - Consult GI - (unassigned [facility]) - greatly appreciate recommendations for further work-up of partial gastric outlet obstruction. Concerned for malignancy and request consideration of EGD  - Most likely secondary to electrolyte abnormality from renal failure  - Continue NG tube  - Endoscopy today!!!  # Anemia- Hemoglobin 10.2 at admission - Hgb 11.6 - Continue to monitor  FEN/GI: Renal Diet 1200cc fluid restrict >>> NPO + chips 2/6 o/n with NGT PPx: SCDs  Disposition: Continue HD per Renal. Cardiology following for AFib and dCHF, starting anticoagulation. Additionally developed gastric outlet obstruction treating with decompression, ordered abd CT with oral contrast, consulted GI for further work-up and possible EGD. Social work for short term rehab for patient.  Subjective: No acute complaints overnight. Was told that she might not have long to live and is feeling tearful. Excited about getting NG tube out. Denies nausea. No further complaints today.  Objective: Temp:  [97.7 F (36.5 C)-99.3 F (37.4 C)] 98.5 F (36.9 C) (02/12 0449) Pulse Rate:  [76-83] 83 (02/12 0441) Resp:  [15-20] 15 (02/12 0441) BP: (117-133)/(42-53) 125/52 mmHg (02/12 0441) SpO2:  [90 %-100 %] 90 % (02/12 0441) Weight:  [174 lb 3.7 oz (79.03 kg)] 174 lb 3.7 oz (79.03 kg) (  02/11 2113) Physical Exam: General: 74yo female resting in HD, somewhat uncomfortable with nausea, NAD HEENT - NGT in place (dark gastric  contents) Cardiovascular: RRR, no murmurs Respiratory: CTAB. No wheezes or focal crackles. [place] in place. Abdomen: Soft, notably improved mild distended general abdomen with +hypoactive BS, non-tender on exam (improved), no rebound no guarding.  Extremities: No edema noted.  Laboratory:  Recent Labs Lab 01/17/15 0645 01/17/15 1219 01/18/15 0500 01/18/15 1525 01/19/15 0952  WBC 9.2  --  8.9  --  10.1  HGB 9.4*  --  9.7*  --  10.6*  HCT 29.0* 29.5* 30.1*  --  34.2*  PLT 43*  --  64* 49* 54*    Recent Labs Lab 01/13/15 1130  01/17/15 0645 01/18/15 0500 01/19/15 0952  NA  --   < > 141 145 141  K  --   < > 4.0 2.9* 3.5  CL  --   < > 103 104 101  CO2  --   < > 28 28 26   BUN  --   < > 25* 33* 12  CREATININE  --   < > 3.04* 3.56* 2.24*  CALCIUM  --   < > 9.0 9.3 8.9  PROT 6.1  --   --   --   --   BILITOT 0.9  --   --   --   --   ALKPHOS 73  --   --   --   --   ALT 7  --   --   --   --   AST 19  --   --   --   --   GLUCOSE  --   < > 77 95 146*  < > = values in this interval not displayed. Iron/TIBC/Ferritin/ %Sat    Component Value Date/Time   IRON 27* 01/10/2015 0741   TIBC 322 01/10/2015 0741   FERRITIN 37 01/10/2015 0741   IRONPCTSAT 8* 01/10/2015 0741   Urinalysis    Component Value Date/Time   COLORURINE YELLOW 01/10/2015 0116   APPEARANCEUR TURBID* 01/10/2015 0116   LABSPEC 1.025 01/10/2015 0116   PHURINE 7.0 01/10/2015 0116   GLUCOSEU NEGATIVE 01/10/2015 0116   HGBUR LARGE* 01/10/2015 0116   HGBUR trace-lysed 03/08/2010 1328   BILIRUBINUR NEGATIVE 01/10/2015 0116   KETONESUR NEGATIVE 01/10/2015 0116   PROTEINUR 100* 01/10/2015 0116   UROBILINOGEN 1.0 01/10/2015 0116   NITRITE NEGATIVE 01/10/2015 0116   LEUKOCYTESUR MODERATE* 01/10/2015 0116  - BNP 2273.8, unreliable due to CKD - Hepatitis B negative  Imaging/Diagnostic Tests: Dg Chest 1 View  01/19/2015   CLINICAL DATA:  Shortness of breath and weakness  EXAM: CHEST  1 VIEW  COMPARISON:  01/12/2015   FINDINGS: Dialysis catheter from right IJ approach is in stable position, tips in the right atrium. There is a new orogastric tube which enters the stomach, with side port likely at the lower margin of the image.  There is improved aeration since comparison. No edema, effusion, or pneumothorax.  Stable cardiomegaly.  IMPRESSION: 1. New nasogastric tube is in good position. 2. Interval clearing of previously noted opacities.   Electronically Signed   By: Monte Fantasia M.D.   On: 01/19/2015 13:54   Nm Pulmonary Perfusion  01/19/2015   CLINICAL DATA:  Short of breath. Dialysis patient. Positive D-dimer. History of deep venous thrombosis.  EXAM: NUCLEAR MEDICINE  - PERFUSION LUNG SCAN  TECHNIQUE: Perfusion images were obtained in multiple projections after intravenous injection of Tc-66m  MAA.  RADIOPHARMACEUTICALS:  3.0 mCi Tc-8mMAA.  COMPARISON:  Chest radiograph 01/12/2015, 01/19/2015  FINDINGS: There are no wedge-shaped peripheral perfusion defects to suggest acute pulmonary embolism.  IMPRESSION: No evidence of pulmonary embolism.   Electronically Signed   By: SSuzy BouchardM.D.   On: 01/19/2015 12:57   Renal [place]- no evidence of hydronephrosis  RLorna Few DO 01/20/2015, 7:22 AM PGY-1, [place] pager: [phone] text pages welcome

## 2015-01-20 NOTE — Progress Notes (Signed)
CRITICAL VALUE ALERT  Critical value received:  Potassium 2.6  Date of notification:  01-20-15  Time of notification: 0835  Critical value read back:yes  Nurse who received alert:  Ronny Bacon  MD notified (1st page): Dr. Joelyn Oms  Time of first page:  [phone]  MD notified (2nd page):  Time of second page:  Responding MD: Dr. Joelyn Oms  Time MD responded:  [phone]

## 2015-01-21 LAB — BASIC METABOLIC PANEL
ANION GAP: 10 (ref 5–15)
BUN: 6 mg/dL (ref 6–23)
CO2: 26 mmol/L (ref 19–32)
Calcium: 8.2 mg/dL — ABNORMAL LOW (ref 8.4–10.5)
Chloride: 101 mmol/L (ref 96–112)
Creatinine, Ser: 1.79 mg/dL — ABNORMAL HIGH (ref 0.50–1.10)
GFR calc non Af Amer: 27 mL/min — ABNORMAL LOW (ref >90)
GFR, EST AFRICAN AMERICAN: 31 mL/min — AB (ref >90)
Glucose, Bld: 137 mg/dL — ABNORMAL HIGH (ref 70–99)
POTASSIUM: 3.1 mmol/L — AB (ref 3.5–5.1)
Sodium: 137 mmol/L (ref 135–145)

## 2015-01-21 LAB — CBC
HCT: 32.3 % — ABNORMAL LOW (ref 36.0–46.0)
Hemoglobin: 9.9 g/dL — ABNORMAL LOW (ref 12.0–15.0)
MCH: 28.4 pg (ref 26.0–34.0)
MCHC: 30.7 g/dL (ref 30.0–36.0)
MCV: 92.8 fL (ref 78.0–100.0)
PLATELETS: 81 10*3/uL — AB (ref 150–400)
RBC: 3.48 MIL/uL — ABNORMAL LOW (ref 3.87–5.11)
RDW: 20 % — ABNORMAL HIGH (ref 11.5–15.5)
WBC: 8.5 10*3/uL (ref 4.0–10.5)

## 2015-01-21 LAB — GLUCOSE, CAPILLARY
GLUCOSE-CAPILLARY: 126 mg/dL — AB (ref 70–99)
GLUCOSE-CAPILLARY: 133 mg/dL — AB (ref 70–99)
Glucose-Capillary: 179 mg/dL — ABNORMAL HIGH (ref 70–99)
Glucose-Capillary: 157 mg/dL — ABNORMAL HIGH (ref 70–99)

## 2015-01-21 MED ORDER — WARFARIN SODIUM 5 MG PO TABS
5.0000 mg | ORAL_TABLET | Freq: Once | ORAL | Status: AC
  Administered 2015-01-21: 5 mg via ORAL
  Filled 2015-01-21: qty 1

## 2015-01-21 MED ORDER — POTASSIUM CHLORIDE 10 MEQ/100ML IV SOLN
10.0000 meq | INTRAVENOUS | Status: AC
  Administered 2015-01-21 (×4): 10 meq via INTRAVENOUS
  Filled 2015-01-21 (×4): qty 100

## 2015-01-21 MED ORDER — IPRATROPIUM-ALBUTEROL 0.5-2.5 (3) MG/3ML IN SOLN
3.0000 mL | Freq: Three times a day (TID) | RESPIRATORY_TRACT | Status: DC
  Administered 2015-01-21 – 2015-01-25 (×10): 3 mL via RESPIRATORY_TRACT
  Filled 2015-01-21 (×10): qty 3

## 2015-01-21 MED ORDER — SENNOSIDES-DOCUSATE SODIUM 8.6-50 MG PO TABS
1.0000 | ORAL_TABLET | Freq: Every day | ORAL | Status: DC
  Administered 2015-01-21 – 2015-01-24 (×4): 1 via ORAL
  Filled 2015-01-21 (×5): qty 1

## 2015-01-21 NOTE — Progress Notes (Signed)
Family Medicine Teaching Service Daily Progress Note Intern Pager: [phone]  Patient name: Kristi [name] Medical record number: [id number] Date of birth: 12/08/1941 Age: 74 y.o. Gender: female  Primary Care Provider: Alease Frame Marylynn Pearson, MD Consultants: CCM, Renal, Cardiology, GI Code Status: Full  Assessment and Plan: 74yo female with acute renal failure admitted for emergent HD and for symptomatic bradycardia. PMH includes hyperlipidemia, hyperparathyroidism, CAD, MI (2008), HTN, COPD, DM, Anemia, CKD, GERD, Arthritis, CHF, CVA, DVT, and PAH  # Acute on Chronic Kidney Disease, considered new ESRD - Followed by Renal - greatly appreciate recommendations - Received HD on 2/1, 2/2, 2/4, 2/6, 2/8, 2/10, 2/12 (MWF schedule) - Creatinine 3.56>>2.24>2.38>0.60>1.79, improved from 11.8 at admission.   - Potassium 3.1--potassium chloride 2mq IV x4  # Atrial Fibrillation, New onset, questionable if paroxysmal - Controlled - On admission with symptomatic bradycardia (resolved s/p atropine x 1). Dx AFib evidenced on initial EKGs. ECHO with normal LV fxn. - Cardiology following - greatly appreciate recommendations - Continue Metoprolol 580mIV BID--Transition to 2519mO BID as diet allows - Coumadin per pharm - chronic anticoagulation per Cards recs - Discontinue ASA on discharge  # Thrombocytopenia - Improved   - Platelet trend 64>49>54>63>81 - INR 2.42>2.02>1.57>1.35 - HIT panel 0.220 (normal) - Folate normal - Vitamin B12 >2000 - ANA negative - Peripheral Blood Smear- Polychromasia - Hold all heparin SQ and requested no heparin HD - Continue SCDs - Hematology consulted. Appreciate recommendations.  - Vitamin K 1mg63m 2/11  - DIC Panel: PT 24.5, INR 2.19, PTT 86, D Dimer >20, Fibrinogen 378  - Follow up UPEP, SPEP. If M spike is elevated consider bone marrow biopsy. - VQ scan showed no PE, Dopplers no sign of DVT   # Abd Pain / Distention / Nausea - Likely d/t to massive distended stomach  w/ likely gastric outlet obstruction - Diff dx includes duodenitis w/ stricture (known hx GERD) vs concern for obstructing malignancy vs. Electrolyte abnormality - Lipase 41, LFTs nml, Lactic acid 1.5 >> 1.2 - Identified on Abd X-ray 2/6, per radiology only partial obstruction - CT- diffuse gastric wall thickening, generally mild but more prominent in proximal stomach; mucosal thickening in region of the pylorus; could indicate gastritis or diffuse infiltrating neoplasm - Zofran PRN - NG Tube in place - Consult GI - (unassigned [facility]) - greatly appreciate recommendations for further work-up of partial gastric outlet obstruction. Concerned for malignancy and request consideration of EGD  - Most likely secondary to electrolyte abnormality from renal failure  - Endoscopy normal on 2/12  - NG tube discontinued 2/12. Advance diet as tolerated  FEN/GI: Clear Liquid Diet PPx: SCDs  Disposition: Continue HD per Renal. Cardiology following for AFib and dCHF, starting anticoagulation. Additionally developed gastric outlet obstruction treating with decompression, ordered abd CT with oral contrast, consulted GI for further work-up and possible EGD. Social work for short term rehab for patient.  Subjective: No acute complaints overnight. Feeling much better today and excited to advance her diet. Denies nausea. Complains of constipation. No further concerns today.  Objective: Temp:  [97.8 F (36.6 C)-98.7 F (37.1 C)] 98.7 F (37.1 C) (02/13 0612) Pulse Rate:  [75-94] 77 (02/13 0612) Resp:  [13-25] 18 (02/13 0612) BP: (100-146)/(14-67) 123/48 mmHg (02/13 0612) SpO2:  [92 %-98 %] 94 % (02/13 0748) Weight:  [177 lb 0.5 oz (80.3 kg)-178 lb 2.1 oz (80.8 kg)] 178 lb 2.1 oz (80.8 kg) (02/12 2119) Physical Exam: General: 73yo35yoale resting comfortably in no  apparent distress Cardiovascular: S1 and S2 noted. No murmurs/rubs/gallops. Regular rate and rhythm. Respiratory: CTAB. No wheezes or focal  crackles. [place] in place. Abdomen: Soft and nondistended, non-tender on exam, no rebound no guarding.  Extremities: No edema noted.  Laboratory:  Recent Labs Lab 01/18/15 0500 01/18/15 1525 01/19/15 0952 01/20/15 0500 01/20/15 1144  WBC 8.9  --  10.1 10.2  --   HGB 9.7*  --  10.6* 9.6* 11.6*  HCT 30.1*  --  34.2* 29.7* 34.0*  PLT 64* 49* 54* 63*  --     Recent Labs Lab 01/19/15 0952 01/20/15 0500 01/20/15 1144 01/21/15 0500  NA 141 136 138 137  K 3.5 2.6* 3.5 3.1*  CL 101 98 97 101  CO2 26 30  --  26  BUN 12 15 <3* 6  CREATININE 2.24* 2.38* 0.60 1.79*  CALCIUM 8.9 8.4  --  8.2*  GLUCOSE 146* 150* 115* 137*   Iron/TIBC/Ferritin/ %Sat    Component Value Date/Time   IRON 27* 01/10/2015 0741   TIBC 322 01/10/2015 0741   FERRITIN 37 01/10/2015 0741   IRONPCTSAT 8* 01/10/2015 0741   Urinalysis    Component Value Date/Time   COLORURINE YELLOW 01/10/2015 0116   APPEARANCEUR TURBID* 01/10/2015 0116   LABSPEC 1.025 01/10/2015 0116   PHURINE 7.0 01/10/2015 0116   GLUCOSEU NEGATIVE 01/10/2015 0116   HGBUR LARGE* 01/10/2015 0116   HGBUR trace-lysed 03/08/2010 1328   BILIRUBINUR NEGATIVE 01/10/2015 0116   KETONESUR NEGATIVE 01/10/2015 0116   PROTEINUR 100* 01/10/2015 0116   UROBILINOGEN 1.0 01/10/2015 0116   NITRITE NEGATIVE 01/10/2015 0116   LEUKOCYTESUR MODERATE* 01/10/2015 0116  - BNP 2273.8, unreliable due to CKD - Hepatitis B negative  Imaging/Diagnostic Tests: Dg Chest 1 View  01/19/2015   CLINICAL DATA:  Shortness of breath and weakness  EXAM: CHEST  1 VIEW  COMPARISON:  01/12/2015  FINDINGS: Dialysis catheter from right IJ approach is in stable position, tips in the right atrium. There is a new orogastric tube which enters the stomach, with side port likely at the lower margin of the image.  There is improved aeration since comparison. No edema, effusion, or pneumothorax.  Stable cardiomegaly.  IMPRESSION: 1. New nasogastric tube is in good position. 2. Interval  clearing of previously noted opacities.   Electronically Signed   By: Monte Fantasia M.D.   On: 01/19/2015 13:54   Nm Pulmonary Perfusion  01/19/2015   CLINICAL DATA:  Short of breath. Dialysis patient. Positive D-dimer. History of deep venous thrombosis.  EXAM: NUCLEAR MEDICINE  - PERFUSION LUNG SCAN  TECHNIQUE: Perfusion images were obtained in multiple projections after intravenous injection of Tc-56mMAA.  RADIOPHARMACEUTICALS:  3.0 mCi Tc-943mAA.  COMPARISON:  Chest radiograph 01/12/2015, 01/19/2015  FINDINGS: There are no wedge-shaped peripheral perfusion defects to suggest acute pulmonary embolism.  IMPRESSION: No evidence of pulmonary embolism.   Electronically Signed   By: StSuzy Bouchard.D.   On: 01/19/2015 12:57   Renal [place] no evidence of hydronephrosis  RaLorna FewDO 01/21/2015, 9:20 AM PGY-1, [place] pager: [phone] pages welcome

## 2015-01-21 NOTE — Progress Notes (Signed)
ANTICOAGULATION CONSULT NOTE - Follow Up Consult  Pharmacy Consult for Coumadin Indication: atrial fibrillation  Allergies  Allergen Reactions  . Losartan Other (See Comments)    Causes increase in creatinine and worsening CKD  . Chantix [Varenicline] Nausea Only and Other (See Comments)    Dizzy and GI symptoms     Patient Measurements: Height: 5' (152.4 cm) Weight: 178 lb 2.1 oz (80.8 kg) IBW/kg (Calculated) : 45.5   Vital Signs: Temp: 98.7 F (37.1 C) (02/13 0612) Temp Source: Oral (02/13 0612) BP: 123/48 mmHg (02/13 0612) Pulse Rate: 77 (02/13 0612)  Labs:  Recent Labs  01/18/15 1525  01/19/15 0952 01/19/15 1726 01/20/15 0500 01/20/15 1144 01/21/15 0500  HGB  --   < > 10.6*  --  9.6* 11.6*  --   HCT  --   --  34.2*  --  29.7* 34.0*  --   PLT 49*  --  54*  --  63*  --   --   APTT 86*  --   --   --   --   --   --   LABPROT 24.5*  --  23.1* 18.9* 16.8*  --   --   INR 2.19*  --  2.02* 1.57* 1.35  --   --   CREATININE  --   < > 2.24*  --  2.38* 0.60 1.79*  < > = values in this interval not displayed.  Estimated Creatinine Clearance: 25.9 mL/min (by C-G formula based on Cr of 1.79).   Medications:  Prescriptions prior to admission  Medication Sig Dispense Refill Last Dose  . acetaminophen (TYLENOL) 500 MG tablet Take 1,000 mg by mouth every 6 (six) hours as needed.   01/08/2015 at Unknown time  . acetaminophen-codeine (TYLENOL #3) 300-30 MG per tablet 1 tablet every 6 (six) hours as needed for moderate pain.    Past Week at Unknown time  . albuterol (PROVENTIL HFA) 108 (90 BASE) MCG/ACT inhaler Inhale 2 puffs into the lungs every 4 (four) hours as needed. For wheezing 8.5 g 2 Past Week at Unknown time  . allopurinol (ZYLOPRIM) 100 MG tablet TAKE 2 TABLETS BY MOUTH EVERY DAY (Patient taking differently: TAKE 3 TABLETS BY MOUTH EVERY DAY) 60 tablet 3 01/09/2015 at Unknown time  . amLODipine (NORVASC) 5 MG tablet Take 1 tablet (5 mg total) by mouth daily. 90 tablet 3  01/09/2015 at Unknown time  . aspirin 325 MG tablet Take 325 mg by mouth at bedtime.    01/08/2015 at Unknown time  . [facility] ALLERGY RELIEF 180 MG tablet TAKE 1 TABLET BY MOUTH EVERY DAY (Patient taking differently: TAKE 1 TABLET BY MOUTH as needed for allergies) 90 tablet 3 Past Month at Unknown time  . furosemide (LASIX) 80 MG tablet Take 1 tablet (80 mg total) by mouth daily. (Patient taking differently: Take 80-160 mg by mouth 2 (two) times daily. Takes 160mg  in am and 80 or 160mg depending in pm) 30 tablet 0 01/09/2015 at Unknown time  . glipiZIDE (GLUCOTROL XL) 5 MG 24 hr tablet Take 1 tablet (5 mg total) by mouth daily with breakfast. (Patient taking differently: Take 5 mg by mouth every evening. ) 30 tablet 11 01/08/2015 at Unknown time  . glucosamine-chondroitin 500-400 MG tablet Take 1 tablet by mouth 2 (two) times daily.     01/09/2015 at Unknown time  . HYDROcodone-acetaminophen (NORCO/VICODIN) 5-325 MG per tablet Take 1 tablet by mouth every 12 (twelve) hours as needed for moderate pain. 60 tablet  0 Past Week at Unknown time  . insulin glargine (LANTUS) 100 UNIT/ML injection Inject 0.13 mLs (13 Units total) into the skin every morning. If blood sugar is >140 add 1 unit. If blood sugar is <140 subtract 1 unit. 10 mL 2 01/09/2015 at Unknown time  . metoprolol succinate (TOPROL-XL) 25 MG 24 hr tablet Take 0.5 tablets (12.5 mg total) by mouth daily. 90 tablet 3 01/08/2015 at 2100  . mometasone-formoterol (DULERA) 100-5 MCG/ACT AERO Inhale 2 puffs into the lungs 2 (two) times daily. (Patient taking differently: Inhale 2 puffs into the lungs as needed for shortness of breath. ) 13 g 1 01/09/2015 at Unknown time  . nitroGLYCERIN (NITROSTAT) 0.4 MG SL tablet Place 0.4 mg under the tongue every 5 (five) minutes as needed. For chest pain     Call 911 if pain not relieved by 3rd tab   not used  . NON FORMULARY Place 2 L into the nose continuous. 2 liters of o2   01/09/2015 at Unknown time  . Omega-3 Fatty Acids (FISH  OIL) 1200 MG CAPS Take 2 capsules by mouth 2 (two) times daily.   01/09/2015 at Unknown time  . omeprazole (PRILOSEC) 20 MG capsule TAKE ONE CAPSULE BY MOUTH AT BEDTIME 90 capsule 3 01/08/2015 at Unknown time  . oxyCODONE-acetaminophen (PERCOCET/ROXICET) 5-325 MG per tablet Take 1 tablet by mouth 3 (three) times daily as needed for severe pain.   Past Week at Unknown time  . Polyethyl Glycol-Propyl Glycol (SYSTANE OP) Apply 1-2 drops to eye 4 (four) times daily as needed. For dry eyes   Past Week at Unknown time  . pravastatin (PRAVACHOL) 40 MG tablet Take 1 tablet (40 mg total) by mouth daily. (Patient taking differently: Take 40 mg by mouth every evening. ) 90 tablet 3 01/08/2015 at Unknown time  . Tiotropium Bromide Monohydrate (SPIRIVA RESPIMAT) 2.5 MCG/ACT AERS Inhale 2 puffs once daily (Patient taking differently: Inhale 2 puffs into the lungs as needed. Inhale 2 puffs once daily) 1 Inhaler 11 Past Week at Unknown time  . vitamin B-12 (CYANOCOBALAMIN) 1000 MCG tablet Take 1,000 mcg by mouth every morning.    01/09/2015 at Unknown time  . benzonatate (TESSALON) 100 MG capsule Take 1 capsule (100 mg total) by mouth 3 (three) times daily as needed for cough. 20 capsule 0 Taking  . doxycycline (VIBRA-TABS) 100 MG tablet Take 1 tablet (100 mg total) by mouth every 12 (twelve) hours. Starting tonight 7 tablet 0 Taking  . glucose blood (ACCU-CHEK SMARTVIEW) test strip 1 each by Other route 2 (two) times daily. Use as instructed 100 each 12   . Lancets (ACCU-CHEK SOFT TOUCH) lancets 1 each by Other route 2 (two) times daily. Use as instructed 100 each 12   . predniSONE (DELTASONE) 50 MG tablet Take 1 tablet (50 mg total) by mouth daily with breakfast. 1 tablet 0 Taking    Assessment: 74 yo F presents on 2/1 with weakness and SOB. Pharmacy dosing coumadin for Afib. Restarted on 2/12 after holding because of a elevated INR after two 5mg  doses. Vit K was then used for EGD. Pt is s/p unremarkable EGD. INR  continues to trend down to 1.35 after one 5mg  dose. Will plan on giving another dose of 5mg  due to Vit K administration.  Goal of Therapy:  INR 2-3 Monitor platelets by anticoagulation protocol: Yes   Plan:  Give coumadin 5mg  PO x 1 Monitor daily INR, CBC, s/s of bleed Will likely need lower Coumadin dose  once INR trending up. F/U need for bridge?  Reginia Naas 01/21/2015,2:02 PM

## 2015-01-22 DIAGNOSIS — I482 Chronic atrial fibrillation: Secondary | ICD-10-CM

## 2015-01-22 LAB — CBC
HCT: 27.3 % — ABNORMAL LOW (ref 36.0–46.0)
Hemoglobin: 8.6 g/dL — ABNORMAL LOW (ref 12.0–15.0)
MCH: 28.1 pg (ref 26.0–34.0)
MCHC: 31.5 g/dL (ref 30.0–36.0)
MCV: 89.2 fL (ref 78.0–100.0)
PLATELETS: 72 10*3/uL — AB (ref 150–400)
RBC: 3.06 MIL/uL — ABNORMAL LOW (ref 3.87–5.11)
RDW: 19.3 % — ABNORMAL HIGH (ref 11.5–15.5)
WBC: 5.6 10*3/uL (ref 4.0–10.5)

## 2015-01-22 LAB — BASIC METABOLIC PANEL
ANION GAP: 13 (ref 5–15)
BUN: 12 mg/dL (ref 6–23)
CALCIUM: 8.8 mg/dL (ref 8.4–10.5)
CO2: 18 mmol/L — AB (ref 19–32)
Chloride: 99 mmol/L (ref 96–112)
Creatinine, Ser: 2.25 mg/dL — ABNORMAL HIGH (ref 0.50–1.10)
GFR calc Af Amer: 24 mL/min — ABNORMAL LOW (ref >90)
GFR calc non Af Amer: 20 mL/min — ABNORMAL LOW (ref >90)
GLUCOSE: 103 mg/dL — AB (ref 70–99)
Potassium: 4.2 mmol/L (ref 3.5–5.1)
Sodium: 130 mmol/L — ABNORMAL LOW (ref 135–145)

## 2015-01-22 LAB — GLUCOSE, CAPILLARY
GLUCOSE-CAPILLARY: 111 mg/dL — AB (ref 70–99)
Glucose-Capillary: 112 mg/dL — ABNORMAL HIGH (ref 70–99)
Glucose-Capillary: 150 mg/dL — ABNORMAL HIGH (ref 70–99)
Glucose-Capillary: 127 mg/dL — ABNORMAL HIGH (ref 70–99)

## 2015-01-22 LAB — PROTIME-INR
INR: 2.23 — ABNORMAL HIGH (ref 0.00–1.49)
PROTHROMBIN TIME: 24.9 s — AB (ref 11.6–15.2)

## 2015-01-22 MED ORDER — METOPROLOL TARTRATE 25 MG PO TABS
25.0000 mg | ORAL_TABLET | Freq: Two times a day (BID) | ORAL | Status: DC
  Administered 2015-01-22 – 2015-01-25 (×5): 25 mg via ORAL
  Filled 2015-01-22 (×7): qty 1

## 2015-01-22 NOTE — Progress Notes (Signed)
Palliative Medicine Team consult received- noted patient improvement and possible change in goals. Will defer to FPTS to do primary palliative care through goals of care discussion and code status discussion given her current status-if there are difficulties or we can be of further assistance please call [phone] on 2/15 to confirm desire for full consultation or cancel the order in epic if no longer needed.  Lane Hacker, DO Palliative Medicine

## 2015-01-22 NOTE — Progress Notes (Signed)
Family Medicine Teaching Service Daily Progress Note Intern Pager: [phone]  Patient name: Kristi [name] Medical record number: [id number] Date of birth: 06-Jan-1941 Age: 74 y.o. Gender: female  Primary Care Provider: Alease Frame Marylynn Pearson, MD Consultants: CCM, Renal, Cardiology, GI Code Status: Full  Assessment and Plan: 74yo female with acute renal failure admitted for emergent HD and for symptomatic bradycardia. 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   - Needs outpatient dialysis center set-up  # Atrial Fibrillation, New onset, questionable if paroxysmal - Controlled - On admission with symptomatic bradycardia (resolved s/p atropine x 1). Dx AFib evidenced on initial EKGs. ECHO with normal LV fxn. - Cardiology following - greatly appreciate recommendations - Continue Metoprolol 59m PO BID as diet allows - Coumadin per pharm - chronic anticoagulation per Cards recs - Discontinue ASA on discharge  # Thrombocytopenia - Improved   - Platelet trend 64>49>54>63>81>72 - INR 2.42>2.02>1.57>1.35>2.23 - HIT panel 0.220 (normal) - Folate normal - Vitamin B12 >2000 - ANA negative - Peripheral Blood Smear- Polychromasia - Hold all heparin SQ and requested no heparin HD - Continue SCDs - Hematology consulted. Appreciate recommendations.  - Vitamin K 148mon 2/11  - DIC Panel: PT 24.5, INR 2.19, PTT 86, D Dimer >20, Fibrinogen 378  - Follow up UPEP, SPEP. If M spike is elevated consider bone marrow biopsy. - VQ scan showed no PE, Dopplers no sign of DVT   # Abd Pain / Distention / Nausea - Likely d/t to massive distended stomach w/ possible gastric  outlet obstruction - Diff dx includes duodenitis w/ stricture (known hx GERD) vs concern for obstructing malignancy vs. Electrolyte abnormality - Lipase 41, LFTs nml, Lactic acid 1.5 >> 1.2 - Identified on Abd X-ray 2/6, per radiology only partial obstruction - CT- diffuse gastric wall thickening, generally mild but more prominent in proximal stomach; mucosal thickening in region of the pylorus; could indicate gastritis or diffuse infiltrating neoplasm - Zofran PRN - NG Tube in place - Consult GI - (unassigned [facility]) - greatly appreciate recommendations for further work-up of partial gastric outlet obstruction.   - Most likely secondary to electrolyte abnormality from renal failure  - Endoscopy normal on 2/12  - NG tube discontinued 2/12. Advance diet as tolerated  FEN/GI: Clear Liquid Diet, ADAT PPx: SCDs  Disposition: Continue HD per Renal. Cardiology following for AFib and dCHF, starting anticoagulation.  Social work for short term rehab for patient.  Subjective: No acute complaints overnight. Feeling much better today and excited to advance her diet. Denies nausea.   Objective: Temp:  [98 F (36.7 C)-98.7 F (37.1 C)] 98 F (36.7 C) (02/14 0528) Pulse Rate:  [72-87] 72 (02/14 0528) Resp:  [18-21] 21 (02/14 0528) BP: (103-117)/(42-51) 117/51 mmHg (02/14 0528) SpO2:  [94 %-100 %] 100 % (02/14 0528) Weight:  [183 lb 10.3 oz (83.3 kg)] 183 lb 10.3 oz (83.3 kg) (02/13 2115) Physical Exam: General: [id number] resting comfortably in no apparent distress Cardiovascular: S1 and S2 noted. No murmurs/rubs/gallops. Regular rate and rhythm. Respiratory: CTAB. No wheezes or focal crackles. [place] in place. Abdomen: Soft and nondistended, non-tender on exam, no rebound no guarding.  Extremities: No edema noted.  Laboratory:  Recent  Labs Lab 01/19/15 0952 01/20/15 0500 01/20/15 1144 01/21/15 1425  WBC 10.1 10.2  --  8.5  HGB 10.6* 9.6* 11.6* 9.9*  HCT 34.2* 29.7* 34.0* 32.3*  PLT  54* 63*  --  81*    Recent Labs Lab 01/20/15 0500 01/20/15 1144 01/21/15 0500 01/22/15 0702  NA 136 138 137 130*  K 2.6* 3.5 3.1* 4.2  CL 98 97 101 99  CO2 30  --  26 18*  BUN 15 <3* 6 12  CREATININE 2.38* 0.60 1.79* 2.25*  CALCIUM 8.4  --  8.2* 8.8  GLUCOSE 150* 115* 137* 103*   Iron/TIBC/Ferritin/ %Sat    Component Value Date/Time   IRON 27* 01/10/2015 0741   TIBC 322 01/10/2015 0741   FERRITIN 37 01/10/2015 0741   IRONPCTSAT 8* 01/10/2015 0741   Urinalysis    Component Value Date/Time   COLORURINE YELLOW 01/10/2015 0116   APPEARANCEUR TURBID* 01/10/2015 0116   LABSPEC 1.025 01/10/2015 0116   PHURINE 7.0 01/10/2015 0116   GLUCOSEU NEGATIVE 01/10/2015 0116   HGBUR LARGE* 01/10/2015 0116   HGBUR trace-lysed 03/08/2010 1328   BILIRUBINUR NEGATIVE 01/10/2015 0116   KETONESUR NEGATIVE 01/10/2015 0116   PROTEINUR 100* 01/10/2015 0116   UROBILINOGEN 1.0 01/10/2015 0116   NITRITE NEGATIVE 01/10/2015 0116   LEUKOCYTESUR MODERATE* 01/10/2015 0116  - BNP 2273.8, unreliable due to CKD - Hepatitis B negative  Imaging/Diagnostic Tests: Dg Chest 1 View  01/19/2015   CLINICAL DATA:  Shortness of breath and weakness  EXAM: CHEST  1 VIEW  COMPARISON:  01/12/2015  FINDINGS: Dialysis catheter from right IJ approach is in stable position, tips in the right atrium. There is a new orogastric tube which enters the stomach, with side port likely at the lower margin of the image.  There is improved aeration since comparison. No edema, effusion, or pneumothorax.  Stable cardiomegaly.  IMPRESSION: 1. New nasogastric tube is in good position. 2. Interval clearing of previously noted opacities.   Electronically Signed   By: Monte Fantasia M.D.   On: 01/19/2015 13:54   Nm Pulmonary Perfusion  01/19/2015   CLINICAL DATA:  Short of breath. Dialysis patient. Positive D-dimer. History of deep venous thrombosis.  EXAM: NUCLEAR MEDICINE  - PERFUSION LUNG SCAN  TECHNIQUE: Perfusion images were  obtained in multiple projections after intravenous injection of Tc-61mMAA.  RADIOPHARMACEUTICALS:  3.0 mCi Tc-951mAA.  COMPARISON:  Chest radiograph 01/12/2015, 01/19/2015  FINDINGS: There are no wedge-shaped peripheral perfusion defects to suggest acute pulmonary embolism.  IMPRESSION: No evidence of pulmonary embolism.   Electronically Signed   By: StSuzy Bouchard.D.   On: 01/19/2015 12:57   Renal [place] no evidence of hydronephrosis  ElFrazier RichardsMD 01/22/2015, 8:55 AM PGY-2, [place] pager: [phone] pages welcome

## 2015-01-22 NOTE — Progress Notes (Signed)
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 sodium chloride   Intravenous Once  . arformoterol  15 mcg Nebulization BID  . budesonide  0.5 mg Nebulization BID  . calcium acetate  667 mg Oral TID WC  . darbepoetin (ARANESP) injection - DIALYSIS  100 mcg Intravenous Q Mon-HD  . doxercalciferol  2 mcg Intravenous Q T,Th,Sa-HD  . ferric gluconate (FERRLECIT/NULECIT) IV  125 mg Intravenous Q M,W,F-HD  . insulin aspart  0-15 Units Subcutaneous TID WC  . insulin aspart  0-5 Units Subcutaneous QHS  . ipratropium-albuterol  3 mL Nebulization TID  . metoprolol  5 mg Intravenous Q12H  . multivitamin  1 tablet Oral QHS  . pantoprazole (PROTONIX) IV  40 mg Intravenous Q24H  . senna-docusate  1 tablet Oral QHS  . sodium chloride  3 mL Intravenous Q12H  . Warfarin - Pharmacist Dosing Inpatient   Does not apply q1800   Continuous Infusions: . dextrose 5 % and 0.45% NaCl 75 mL/hr at 01/21/15 1217   PRN Meds:.sodium chloride, sodium chloride, acetaminophen, benzonatate, morphine injection, ondansetron (ZOFRAN) IV, sodium chloride  Current Labs: reviewed    Physical Exam:  Blood pressure 117/51, pulse 72, temperature 98 F (36.7 C), temperature source Oral, resp. rate 21, height 5' (1.524 m), weight 83.3 kg (183 lb 10.3 oz), SpO2 100 %. NAD IRIR, no rub Diminished in bases No LEE +B/T of L RC AVF Extensive aging brusing of distal LUE Nonfocal  A/P 1. New ESRD:  1. cont on MWF schedule 2. CLIP ongoing -- was awaiting resolution of GI issues 3. 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 Asked palliative care to meet with pt when looking frail, weak, worsening; she was unclear about long term HD needs; converation might have changed now that showing clear improvement 2. N/V, gastric distension 1. Resolving, much improved 2. GI following 3. Hyperkalemia: resolved; now hypokalemia needing 4K bath with HD (GI losses) 4. Vol O/L / Pulm HTN: stable volume status 1. Try 2L UF next HD, start working with weights now that PO intake improved 5. Anemia:  1. s/p 2u PRBC, appropriate increase 01/16/15 2. On ESA and IV Fe 6. Atrial Fibrillation on warfarin 7. 2HPTH: cont VDRA and PhosLo  Pearson Grippe MD 01/22/2015, 7:39 AM   Recent Labs Lab 01/16/15 0730  01/18/15 0500 01/19/15 0952 01/20/15 0500 01/20/15 1144 01/21/15 0500  NA 152*  < > 145 141 136 138 137  K 2.4*  < > 2.9* 3.5 2.6* 3.5 3.1*  CL 114*  < > 104 101 98 97 101  CO2 34*  < > 28 26 30   --  26  GLUCOSE 112*  < > 95 146* 150* 115* 137*  BUN <5*  < > 33* 12 15 <3* 6  CREATININE 0.41*  < > 3.56* 2.24* 2.38* 0.60 1.79*  CALCIUM 8.8  < > 9.3 8.9 8.4  --  8.2*  PHOS <1.0*  --  4.2  --   --   --   --   < > =  values in this interval not displayed.  Recent Labs Lab 01/18/15 0500  01/19/15 0952 01/20/15 0500 01/20/15 1144 01/21/15 1425  WBC 8.9  --  10.1 10.2  --  8.5  NEUTROABS 6.9  --   --   --   --   --   HGB 9.7*  --  10.6* 9.6* 11.6* 9.9*  HCT 30.1*  --  34.2* 29.7* 34.0* 32.3*  MCV 89.9  --  93.2 89.5  --  92.8  PLT 64*  < > 54* 63*  --  81*  < > = values in this interval not displayed.

## 2015-01-22 NOTE — Progress Notes (Signed)
ANTICOAGULATION CONSULT NOTE - Follow Up Consult  Pharmacy Consult for Coumadin Indication: atrial fibrillation  Allergies  Allergen Reactions  . Losartan Other (See Comments)    Causes increase in creatinine and worsening CKD  . Chantix [Varenicline] Nausea Only and Other (See Comments)    Dizzy and GI symptoms     Patient Measurements: Height: 5' (152.4 cm) Weight: 183 lb 10.3 oz (83.3 kg) IBW/kg (Calculated) : 45.5  Vital Signs: Temp: 98.4 F (36.9 C) (02/14 0903) Temp Source: Oral (02/14 0903) BP: 119/47 mmHg (02/14 0903) Pulse Rate: 70 (02/14 0903)  Labs:  Recent Labs  01/19/15 1726  01/20/15 0500 01/20/15 1144 01/21/15 0500 01/21/15 1425 01/22/15 0702  HGB  --   < > 9.6* 11.6*  --  9.9* 8.6*  HCT  --   < > 29.7* 34.0*  --  32.3* 27.3*  PLT  --   --  63*  --   --  81* 72*  LABPROT 18.9*  --  16.8*  --   --   --  24.9*  INR 1.57*  --  1.35  --   --   --  2.23*  CREATININE  --   < > 2.38* 0.60 1.79*  --  2.25*  < > = values in this interval not displayed.  Estimated Creatinine Clearance: 21 mL/min (by C-G formula based on Cr of 2.25).   Medications:  Prescriptions prior to admission  Medication Sig Dispense Refill Last Dose  . acetaminophen (TYLENOL) 500 MG tablet Take 1,000 mg by mouth every 6 (six) hours as needed.   01/08/2015 at Unknown time  . acetaminophen-codeine (TYLENOL #3) 300-30 MG per tablet 1 tablet every 6 (six) hours as needed for moderate pain.    Past Week at Unknown time  . albuterol (PROVENTIL HFA) 108 (90 BASE) MCG/ACT inhaler Inhale 2 puffs into the lungs every 4 (four) hours as needed. For wheezing 8.5 g 2 Past Week at Unknown time  . allopurinol (ZYLOPRIM) 100 MG tablet TAKE 2 TABLETS BY MOUTH EVERY DAY (Patient taking differently: TAKE 3 TABLETS BY MOUTH EVERY DAY) 60 tablet 3 01/09/2015 at Unknown time  . amLODipine (NORVASC) 5 MG tablet Take 1 tablet (5 mg total) by mouth daily. 90 tablet 3 01/09/2015 at Unknown time  . aspirin 325 MG  tablet Take 325 mg by mouth at bedtime.    01/08/2015 at Unknown time  . [facility] ALLERGY RELIEF 180 MG tablet TAKE 1 TABLET BY MOUTH EVERY DAY (Patient taking differently: TAKE 1 TABLET BY MOUTH as needed for allergies) 90 tablet 3 Past Month at Unknown time  . furosemide (LASIX) 80 MG tablet Take 1 tablet (80 mg total) by mouth daily. (Patient taking differently: Take 80-160 mg by mouth 2 (two) times daily. Takes 160mg  in am and 80 or 160mg depending in pm) 30 tablet 0 01/09/2015 at Unknown time  . glipiZIDE (GLUCOTROL XL) 5 MG 24 hr tablet Take 1 tablet (5 mg total) by mouth daily with breakfast. (Patient taking differently: Take 5 mg by mouth every evening. ) 30 tablet 11 01/08/2015 at Unknown time  . glucosamine-chondroitin 500-400 MG tablet Take 1 tablet by mouth 2 (two) times daily.     01/09/2015 at Unknown time  . HYDROcodone-acetaminophen (NORCO/VICODIN) 5-325 MG per tablet Take 1 tablet by mouth every 12 (twelve) hours as needed for moderate pain. 60 tablet 0 Past Week at Unknown time  . insulin glargine (LANTUS) 100 UNIT/ML injection Inject 0.13 mLs (13 Units total) into the  skin every morning. If blood sugar is >140 add 1 unit. If blood sugar is <140 subtract 1 unit. 10 mL 2 01/09/2015 at Unknown time  . metoprolol succinate (TOPROL-XL) 25 MG 24 hr tablet Take 0.5 tablets (12.5 mg total) by mouth daily. 90 tablet 3 01/08/2015 at 2100  . mometasone-formoterol (DULERA) 100-5 MCG/ACT AERO Inhale 2 puffs into the lungs 2 (two) times daily. (Patient taking differently: Inhale 2 puffs into the lungs as needed for shortness of breath. ) 13 g 1 01/09/2015 at Unknown time  . nitroGLYCERIN (NITROSTAT) 0.4 MG SL tablet Place 0.4 mg under the tongue every 5 (five) minutes as needed. For chest pain     Call 911 if pain not relieved by 3rd tab   not used  . NON FORMULARY Place 2 L into the nose continuous. 2 liters of o2   01/09/2015 at Unknown time  . Omega-3 Fatty Acids (FISH OIL) 1200 MG CAPS Take 2 capsules by mouth 2  (two) times daily.   01/09/2015 at Unknown time  . omeprazole (PRILOSEC) 20 MG capsule TAKE ONE CAPSULE BY MOUTH AT BEDTIME 90 capsule 3 01/08/2015 at Unknown time  . oxyCODONE-acetaminophen (PERCOCET/ROXICET) 5-325 MG per tablet Take 1 tablet by mouth 3 (three) times daily as needed for severe pain.   Past Week at Unknown time  . Polyethyl Glycol-Propyl Glycol (SYSTANE OP) Apply 1-2 drops to eye 4 (four) times daily as needed. For dry eyes   Past Week at Unknown time  . pravastatin (PRAVACHOL) 40 MG tablet Take 1 tablet (40 mg total) by mouth daily. (Patient taking differently: Take 40 mg by mouth every evening. ) 90 tablet 3 01/08/2015 at Unknown time  . Tiotropium Bromide Monohydrate (SPIRIVA RESPIMAT) 2.5 MCG/ACT AERS Inhale 2 puffs once daily (Patient taking differently: Inhale 2 puffs into the lungs as needed. Inhale 2 puffs once daily) 1 Inhaler 11 Past Week at Unknown time  . vitamin B-12 (CYANOCOBALAMIN) 1000 MCG tablet Take 1,000 mcg by mouth every morning.    01/09/2015 at Unknown time  . benzonatate (TESSALON) 100 MG capsule Take 1 capsule (100 mg total) by mouth 3 (three) times daily as needed for cough. 20 capsule 0 Taking  . doxycycline (VIBRA-TABS) 100 MG tablet Take 1 tablet (100 mg total) by mouth every 12 (twelve) hours. Starting tonight 7 tablet 0 Taking  . glucose blood (ACCU-CHEK SMARTVIEW) test strip 1 each by Other route 2 (two) times daily. Use as instructed 100 each 12   . Lancets (ACCU-CHEK SOFT TOUCH) lancets 1 each by Other route 2 (two) times daily. Use as instructed 100 each 12   . predniSONE (DELTASONE) 50 MG tablet Take 1 tablet (50 mg total) by mouth daily with breakfast. 1 tablet 0 Taking    Assessment: 74 yo F presents on 2/1 with weakness and SOB. Pharmacy dosing coumadin for Afib. Restarted on 2/12 after holding because of a elevated INR after two 5mg  doses. Vit K was then used for EGD. Pt is s/p unremarkable EGD. INR jumped from 1.35 to 2.23. Hgb down to 8.6, plts 72.  Pt seems to be very sensitive to coumadin. Will hold today's dose and plan on lowering dose on 2/15.  Goal of Therapy:  INR 2-3 Monitor platelets by anticoagulation protocol: Yes   Plan:  Hold coumadin today Monitor daily INR, CBC, s/s of bleed  Lexxi Koslow J 01/22/2015,10:24 AM

## 2015-01-22 NOTE — Discharge Instructions (Signed)

## 2015-01-23 ENCOUNTER — Encounter (HOSPITAL_COMMUNITY): Payer: Self-pay | Admitting: Gastroenterology

## 2015-01-23 LAB — UIFE/LIGHT CHAINS/TP QN, 24-HR UR
ALBUMIN, U: DETECTED
Alpha 1, Urine: DETECTED — AB
Alpha 2, Urine: DETECTED — AB
Beta, Urine: DETECTED — AB
Gamma Globulin, Urine: DETECTED — AB
Total Protein, Urine: 265 mg/dL — ABNORMAL HIGH (ref 5–24)

## 2015-01-23 LAB — CBC
HCT: 27.1 % — ABNORMAL LOW (ref 36.0–46.0)
Hemoglobin: 8.9 g/dL — ABNORMAL LOW (ref 12.0–15.0)
MCH: 28.8 pg (ref 26.0–34.0)
MCHC: 32.8 g/dL (ref 30.0–36.0)
MCV: 87.7 fL (ref 78.0–100.0)
Platelets: 100 10*3/uL — ABNORMAL LOW (ref 150–400)
RBC: 3.09 MIL/uL — ABNORMAL LOW (ref 3.87–5.11)
RDW: 19.3 % — ABNORMAL HIGH (ref 11.5–15.5)
WBC: 4.2 10*3/uL (ref 4.0–10.5)

## 2015-01-23 LAB — BASIC METABOLIC PANEL
Anion gap: 3 — ABNORMAL LOW (ref 5–15)
BUN: 18 mg/dL (ref 6–23)
CHLORIDE: 98 mmol/L (ref 96–112)
CO2: 29 mmol/L (ref 19–32)
CREATININE: 2.63 mg/dL — AB (ref 0.50–1.10)
Calcium: 8.8 mg/dL (ref 8.4–10.5)
GFR calc Af Amer: 19 mL/min — ABNORMAL LOW (ref >90)
GFR, EST NON AFRICAN AMERICAN: 17 mL/min — AB (ref >90)
Glucose, Bld: 96 mg/dL (ref 70–99)
Potassium: 3.8 mmol/L (ref 3.5–5.1)
SODIUM: 130 mmol/L — AB (ref 135–145)

## 2015-01-23 LAB — PROTEIN ELECTROPHORESIS, SERUM
ALBUMIN ELP: 51.6 % — AB (ref 55.8–66.1)
Alpha-1-Globulin: 10.4 % — ABNORMAL HIGH (ref 2.9–4.9)
Alpha-2-Globulin: 13.2 % — ABNORMAL HIGH (ref 7.1–11.8)
BETA 2: 6 % (ref 3.2–6.5)
BETA GLOBULIN: 6.4 % (ref 4.7–7.2)
Gamma Globulin: 12.4 % (ref 11.1–18.8)
M-Spike, %: 0.23 g/dL
Total Protein ELP: 5.8 g/dL — ABNORMAL LOW (ref 6.0–8.3)

## 2015-01-23 LAB — GLUCOSE, CAPILLARY
GLUCOSE-CAPILLARY: 87 mg/dL (ref 70–99)
GLUCOSE-CAPILLARY: 92 mg/dL (ref 70–99)
Glucose-Capillary: 94 mg/dL (ref 70–99)
Glucose-Capillary: 163 mg/dL — ABNORMAL HIGH (ref 70–99)

## 2015-01-23 LAB — PROTIME-INR
INR: 1.82 — ABNORMAL HIGH (ref 0.00–1.49)
PROTHROMBIN TIME: 21.2 s — AB (ref 11.6–15.2)

## 2015-01-23 MED ORDER — WARFARIN SODIUM 5 MG PO TABS
5.0000 mg | ORAL_TABLET | Freq: Once | ORAL | Status: AC
  Administered 2015-01-23: 5 mg via ORAL
  Filled 2015-01-23 (×2): qty 1

## 2015-01-23 MED ORDER — LIDOCAINE HCL (PF) 1 % IJ SOLN: 5.0000 mL | INTRAMUSCULAR | Status: DC | PRN

## 2015-01-23 MED ORDER — NEPRO/CARBSTEADY PO LIQD
237.0000 mL | ORAL | Status: DC | PRN
  Filled 2015-01-23: qty 237

## 2015-01-23 MED ORDER — DARBEPOETIN ALFA 100 MCG/0.5ML IJ SOSY
PREFILLED_SYRINGE | INTRAMUSCULAR | Status: AC
  Administered 2015-01-23: 100 ug via INTRAVENOUS
  Filled 2015-01-23: qty 0.5

## 2015-01-23 MED ORDER — SODIUM CHLORIDE 0.9 % IV SOLN: 100.0000 mL | INTRAVENOUS | Status: DC | PRN

## 2015-01-23 MED ORDER — ALTEPLASE 2 MG IJ SOLR
2.0000 mg | Freq: Once | INTRAMUSCULAR | Status: DC | PRN
  Filled 2015-01-23: qty 2

## 2015-01-23 MED ORDER — MORPHINE SULFATE 2 MG/ML IJ SOLN
INTRAMUSCULAR | Status: AC
  Administered 2015-01-23: 1 mg via INTRAVENOUS
  Filled 2015-01-23: qty 1

## 2015-01-23 MED ORDER — PENTAFLUOROPROP-TETRAFLUOROETH EX AERO: 1.0000 "application " | INHALATION_SPRAY | CUTANEOUS | Status: DC | PRN

## 2015-01-23 MED ORDER — LIDOCAINE-PRILOCAINE 2.5-2.5 % EX CREA
1.0000 "application " | TOPICAL_CREAM | CUTANEOUS | Status: DC | PRN
  Filled 2015-01-23: qty 5

## 2015-01-23 MED ORDER — ANTICOAGULANT SODIUM CITRATE 4% (200MG/5ML) IV SOLN
5.0000 mL | Status: DC | PRN
  Administered 2015-01-23: 5 mL via INTRAVENOUS_CENTRAL
  Filled 2015-01-23 (×3): qty 250

## 2015-01-23 NOTE — Progress Notes (Signed)
Admit: 01/09/2015 LOS: 48  73F new ESRD this admit-  2/2 DM/HTN admitted with volume overload, malaise, uremia  Subjective:  Feels much improved, appears better Not much UOP Was set up at [facility] TTS second but now going to [facility]- not sure if OP arrangements will need to change based on that  02/14 0701 - 02/15 0700 In: 540 [P.O.:540] Out: 300 [Urine:300]  Filed Weights   01/20/15 2119 01/21/15 2115 01/22/15 2101  Weight: 80.8 kg (178 lb 2.1 oz) 83.3 kg (183 lb 10.3 oz) 85.3 kg (188 lb 0.8 oz)    Scheduled Meds: . arformoterol  15 mcg Nebulization BID  . budesonide  0.5 mg Nebulization BID  . calcium acetate  667 mg Oral TID WC  . darbepoetin (ARANESP) injection - DIALYSIS  100 mcg Intravenous Q Mon-HD  . doxercalciferol  2 mcg Intravenous Q T,Th,Sa-HD  . ferric gluconate (FERRLECIT/NULECIT) IV  125 mg Intravenous Q M,W,F-HD  . insulin aspart  0-15 Units Subcutaneous TID WC  . insulin aspart  0-5 Units Subcutaneous QHS  . ipratropium-albuterol  3 mL Nebulization TID  . metoprolol tartrate  25 mg Oral BID  . multivitamin  1 tablet Oral QHS  . senna-docusate  1 tablet Oral QHS  . Warfarin - Pharmacist Dosing Inpatient   Does not apply q1800   Continuous Infusions: . dextrose 5 % and 0.45% NaCl 75 mL/hr at 01/21/15 1217   PRN Meds:.acetaminophen, benzonatate, morphine injection, ondansetron (ZOFRAN) IV  Current Labs: reviewed    Physical Exam:  Blood pressure 104/43, pulse 78, temperature 98.5 F (36.9 C), temperature source Oral, resp. rate 18, height 5' (1.524 m), weight 85.3 kg (188 lb 0.8 oz), SpO2 98 %. NAD IRIR, no rub Diminished in bases No LEE +B/T of L RC AVF Extensive aging brusing of distal LUE Nonfocal  A/P 1. New ESRD:  1. cont on MWF schedule, is due today- has not been done since Friday so will go ahead 2. CLIP ongoing -- did have spot TTS at [facility] but now going to [facility] which may change plan 3. Has PC- her left AVF failed after 2 uses, still  with extensive bruising- VVS wanted elective fistulogram in 2 weeks from 2/4 2. N/V, gastric distension 1. Resolving, much improved 2. GI following 3. Hyperkalemia: resolved; now normokalemic 4. Vol O/L / Pulm HTN: stable volume status 1. Try 2L UF next HD, start working with weights now that PO intake improved 5. Anemia:  1. s/p 2u PRBC, appropriate increase 01/16/15 2. On ESA and IV Fe 6. Atrial Fibrillation on warfarin 7. 2HPTH: cont VDRA and PhosLo  Sheriff Rodenberg A   01/23/2015, 9:54 AM   Recent Labs Lab 01/18/15 0500  01/20/15 0500 01/20/15 1144 01/21/15 0500 01/22/15 0702  NA 145  < > 136 138 137 130*  K 2.9*  < > 2.6* 3.5 3.1* 4.2  CL 104  < > 98 97 101 99  CO2 28  < > 30  --  26 18*  GLUCOSE 95  < > 150* 115* 137* 103*  BUN 33*  < > 15 <3* 6 12  CREATININE 3.56*  < > 2.38* 0.60 1.79* 2.25*  CALCIUM 9.3  < > 8.4  --  8.2* 8.8  PHOS 4.2  --   --   --   --   --   < > = values in this interval not displayed.  Recent Labs Lab 01/18/15 0500  01/20/15 0500 01/20/15 1144 01/21/15 1425 01/22/15  0702  WBC 8.9  < > 10.2  --  8.5 5.6  NEUTROABS 6.9  --   --   --   --   --   HGB 9.7*  < > 9.6* 11.6* 9.9* 8.6*  HCT 30.1*  < > 29.7* 34.0* 32.3* 27.3*  MCV 89.9  < > 89.5  --  92.8 89.2  PLT 64*  < > 63*  --  81* 72*  < > = values in this interval not displayed.

## 2015-01-23 NOTE — Progress Notes (Signed)
CARE MANAGEMENT NOTE 01/23/2015  Patient:  Kristi [name], Kristi [name]   Account Number:  [id number]  Date Initiated:  01/10/2015  Documentation initiated by:  [facility]  Subjective/Objective Assessment:   Admitted with renal failure     Action/Plan:   CM following for progression and d/c planning.   Anticipated DC Date:  01/24/2015   Anticipated DC Plan:  SKILLED NURSING FACILITY  In-house referral  Clinical Social Worker      DC Planning Services  CM consult      Choice offered to / List presented to:             Status of service:  Completed, signed off Medicare Important Message given?  YES (If response is "NO", the following Medicare IM given date fields will be blank) Date Medicare IM given:  01/20/2015 Medicare IM given by:  ROYAL,CHERYL Date Additional Medicare IM given:  01/23/2015 Additional Medicare IM given by:  Jonnie Finner  Discharge Disposition:  [place]  Per UR Regulation:  Reviewed for med. necessity/level of care/duration of stay  If discussed at [place] of Stay Meetings, dates discussed:    Comments:  01/23/2015 1740 Chart reviewed. Plan is SNF placement. Dialysis set up complete, scheduled tx at [facility]. Jonnie Finner RN CCM Case Mgmt phone [phone]   Contact:  Loyal Buba Daughter [phone]      Mercer,Janet Daughter   [phone]

## 2015-01-23 NOTE — Progress Notes (Addendum)
Utilization review complete. Jordyan Hardiman RN CCM Case Mgmt phone [phone] 

## 2015-01-23 NOTE — Progress Notes (Signed)
  ANTICOAGULATION CONSULT NOTE - Follow Up Consult  Pharmacy Consult for Coumadin Indication: atrial fibrillation  Allergies  Allergen Reactions  . Losartan Other (See Comments)    Causes increase in creatinine and worsening CKD  . Chantix [Varenicline] Nausea Only and Other (See Comments)    Dizzy and GI symptoms     Patient Measurements: Height: 5' (152.4 cm) Weight: 184 lb 8.4 oz (83.7 kg) IBW/kg (Calculated) : 45.5  Vital Signs: Temp: 98.2 F (36.8 C) (02/15 1333) Temp Source: Oral (02/15 1000) BP: 107/62 mmHg (02/15 1432) Pulse Rate: 75 (02/15 1432)  Labs:  Recent Labs  01/21/15 0500  01/21/15 1425 01/22/15 0702 01/23/15 0500 01/23/15 0930  HGB  --   < > 9.9* 8.6* 8.9*  --   HCT  --   --  32.3* 27.3* 27.1*  --   PLT  --   --  81* 72* 100*  --   LABPROT  --   --   --  24.9* 21.2*  --   INR  --   --   --  2.23* 1.82*  --   CREATININE 1.79*  --   --  2.25*  --  2.63*  < > = values in this interval not displayed.  Estimated Creatinine Clearance: 18 mL/min (by C-G formula based on Cr of 2.63).    Assessment: 74 yo F presents on 2/1 with weakness and SOB. Pharmacy dosing coumadin for Afib. Restarted on 2/12 after holding because of a elevated INR after two 5mg  doses. Vit K was then used for EGD. Pt is s/p unremarkable EGD. INR today is 1.82 and down from 2/14.  Platelets noted at 100 with trend up.  Goal of Therapy:  INR 2-3 Monitor platelets by anticoagulation protocol: Yes   Plan:  -Coumadin 5mg  po today -Daily PT/INR  Hildred Laser, Pharm D 01/23/2015 2:57 PM

## 2015-01-23 NOTE — Progress Notes (Signed)
Physical Therapy Treatment Patient Details Name: Kristi [name] MRN: [id number] DOB: March 18, 1941 Today's Date: 01/23/2015    History of Present Illness Pt is a 74 y.o. F with PMH as outlined below. She presented to [facility] ED 2/1 with generalized weakness and SOB. She has hx of chronic renal insufficiency (stage IV CKD) but has never required dialysis. She does however have a left wrist AV fistula which has never been accessed before. She states she has been feeling weak for quite some time now, for the past few weeks but has never seeked medical attention. On day of presentation, she felt much worse than before and due to worsening SOB over past 3 days, decided to come to ED for further evaluation.    PT Comments    Pt progressing towards physical therapy goals. Was able to tolerate gait training today and was very excited that she achieved ambulating 20 feet this session. Pt mainly limited by DOE and O2 sats dropped to 86% on 3L/min supplemental O2. Will continue to follow.   Follow Up Recommendations  SNF;Supervision/Assistance - 24 hour     Equipment Recommendations  None recommended by PT    Recommendations for Other Services       Precautions / Restrictions Precautions Precautions: Fall Restrictions Weight Bearing Restrictions: No    Mobility  Bed Mobility Overal bed mobility: Needs Assistance Bed Mobility: Supine to Sit     Supine to sit: Min assist     General bed mobility comments: Pt with difficulty transitioning to full sitting position and required min assist for trunk elevation. Once sitting upright was able to scoot to EOB.   Transfers Overall transfer level: Needs assistance Equipment used: Rolling walker (2 wheeled) Transfers: Sit to/from Stand Sit to Stand: Min assist         General transfer comment: Pt was able to power-up to full standing with min assist for initiation of transfer and balance.   Ambulation/Gait Ambulation/Gait assistance: Min  guard Ambulation Distance (Feet): 20 Feet Assistive device: Rolling walker (2 wheeled) Gait Pattern/deviations: Step-through pattern;Decreased stride length;Trunk flexed;Narrow base of support Gait velocity: Decreased Gait velocity interpretation: Below normal speed for age/gender General Gait Details: Pt was able to ambulate in room on 3L/min supplemental O2. Sats during this time dropped to 86% and pt was SOB.    Stairs            Wheelchair Mobility    Modified Rankin (Stroke Patients Only)       Balance Overall balance assessment: Needs assistance Sitting-balance support: Feet supported;No upper extremity supported Sitting balance-Leahy Scale: Fair     Standing balance support: Bilateral upper extremity supported Standing balance-Leahy Scale: Poor Standing balance comment: Pt requires UE support to maintain standing balance.                     Cognition Arousal/Alertness: Awake/alert Behavior During Therapy: Anxious Overall Cognitive Status: Within Functional Limits for tasks assessed                      Exercises      General Comments        Pertinent Vitals/Pain Pain Assessment: No/denies pain    Home Living                      Prior Function            PT Goals (current goals can now be found in the care plan section)  Acute Rehab PT Goals Patient Stated Goal: Return home after rehab PT Goal Formulation: With patient Time For Goal Achievement: 01/31/15 Potential to Achieve Goals: Good Progress towards PT goals: Progressing toward goals    Frequency  Min 2X/week    PT Plan Current plan remains appropriate    Co-evaluation             End of Session Equipment Utilized During Treatment: Gait belt;Oxygen Activity Tolerance: Patient tolerated treatment well Patient left: in bed;with call bell/phone within reach     Time: 1137-1159 PT Time Calculation (min) (ACUTE ONLY): 22 min  Charges:  $Therapeutic  Activity: 8-22 mins                    G Codes:      Rolinda Roan 2015-02-13, 12:26 PM  Rolinda Roan, PT, DPT Acute Rehabilitation Services Pager: [phone]

## 2015-01-23 NOTE — Progress Notes (Signed)
01/23/2015 12:24 PM Hemodialysis Outpatient Note;  This patient has been accepted at the [facility] on a Tuesday, Thursday and Saturday 2nd shift schedule. I have learned this AM that patient will probably be going to a SNF when discharged from this hospital so her outpatient status may change. I will update further when I have more information. Thank you. Gordy Savers

## 2015-01-23 NOTE — Procedures (Signed)
Patient was seen on dialysis and the procedure was supervised.  BFR 400  Via PC BP is  112/56.   Patient appears to be tolerating treatment well  Kristi [name] A 01/23/2015

## 2015-01-23 NOTE — Progress Notes (Signed)
Family Medicine Teaching Service Daily Progress Note Intern Pager: [phone]  Patient name: Kristi [name] Medical record number: [id number] Date of birth: 07/22/1941 Age: 74 y.o. Gender: female  Primary Care Provider: McKeag, Ian D, MD Consultants: CCM, Renal, Cardiology, GI Code Status: Full  Assessment and Plan: 74yo female with acute renal failure admitted for emergent HD and for symptomatic bradycardia. PMH includes hyperlipidemia, hyperparathyroidism, CAD, MI (2008), HTN, COPD, DM, Anemia, CKD, GERD, Arthritis, CHF, CVA, DVT, and PAH  # Acute on Chronic Kidney Disease, considered new ESRD - Followed by Renal - greatly appreciate recommendations - Received HD on 2/1, 2/2, 2/4, 2/6, 2/8, 2/10, 2/12, Dialysis today (MWF schedule) - Creatinine 2.25>2.63, improved from 11.8 at admission.   - Needs outpatient dialysis center set-up.   # Atrial Fibrillation, New onset, questionable if paroxysmal - Controlled - On admission with symptomatic bradycardia (resolved s/p atropine x 1). Dx AFib evidenced on initial EKGs. ECHO with normal LV fxn. - Cardiology following - greatly appreciate recommendations - Continue Metoprolol 25mg PO BID as diet allows - Coumadin per pharm - chronic anticoagulation per Cards recs - Discontinue ASA on discharge  # Thrombocytopenia - Improved   - Platelet trend 72>100!!!! - INR 2.23>2.63 - HIT panel 0.220 (normal) - Folate normal - Vitamin B12 >2000 - ANA negative - Peripheral Blood Smear- Polychromasia - Hold all heparin SQ and requested no heparin HD - Continue SCDs - Hematology consulted. Appreciate recommendations.  - Vitamin K 1mg on 2/11  - DIC Panel: PT 24.5, INR 2.19, PTT 86, D Dimer >20, Fibrinogen 378  - Follow up UPEP, SPEP. If M spike is elevated consider bone marrow biopsy. - VQ scan showed no PE, Dopplers no sign of DVT   # Abd Pain / Distention / Nausea - Likely d/t to massive distended stomach w/ possible gastric outlet obstruction -  Diff dx includes duodenitis w/ stricture (known hx GERD) vs concern for obstructing malignancy vs. Electrolyte abnormality - Lipase 41, LFTs nml, Lactic acid 1.5 >> 1.2 - Identified on Abd X-ray 2/6, per radiology only partial obstruction - CT- diffuse gastric wall thickening, generally mild but more prominent in proximal stomach; mucosal thickening in region of the pylorus; could indicate gastritis or diffuse infiltrating neoplasm - Zofran PRN - NG Tube in place - Consult GI - (unassigned [facility]) - greatly appreciate recommendations for further work-up of partial gastric outlet obstruction.   - Most likely secondary to electrolyte abnormality from renal failure  - Endoscopy normal on 2/12  - NG tube discontinued 2/12. Advance diet as tolerated, currently on soft diet  FEN/GI: Clear Liquid Diet, ADAT PPx: SCDs  Disposition: Continue HD per Renal. Cardiology following for AFib and dCHF, starting anticoagulation.  Social work for short term rehab for patient. Discharge once rehab facility and dialysis center are found.  Subjective: No acute complaints overnight. Continues to feel well today. Denies nausea. No further concerns today.  Objective: Temp:  [97.5 F (36.4 C)-98.7 F (37.1 C)] 98.5 F (36.9 C) (02/15 0432) Pulse Rate:  [70-78] 78 (02/15 0432) Resp:  [18-20] 18 (02/15 0432) BP: (104-126)/(42-47) 104/43 mmHg (02/15 0432) SpO2:  [96 %-100 %] 96 % (02/15 0432) Weight:  [188 lb 0.8 oz (85.3 kg)] 188 lb 0.8 oz (85.3 kg) (02/14 2101) Physical Exam: General: 74yo female resting comfortably in no apparent distress Cardiovascular: S1 and S2 noted. No murmurs/rubs/gallops. Regular rate and rhythm. Respiratory: CTAB. No wheezes or focal crackles. [place] in place. Abdomen: Soft and nondistended, non-tender on   exam, no rebound no guarding.  Extremities: No edema noted.  Laboratory:  Recent Labs Lab 01/20/15 0500 01/20/15 1144 01/21/15 1425 01/22/15 0702  WBC 10.2  --  8.5 5.6   HGB 9.6* 11.6* 9.9* 8.6*  HCT 29.7* 34.0* 32.3* 27.3*  PLT 63*  --  81* 72*    Recent Labs Lab 01/20/15 0500 01/20/15 1144 01/21/15 0500 01/22/15 0702  NA 136 138 137 130*  K 2.6* 3.5 3.1* 4.2  CL 98 97 101 99  CO2 30  --  26 18*  BUN 15 <3* 6 12  CREATININE 2.38* 0.60 1.79* 2.25*  CALCIUM 8.4  --  8.2* 8.8  GLUCOSE 150* 115* 137* 103*   Iron/TIBC/Ferritin/ %Sat    Component Value Date/Time   IRON 27* 01/10/2015 0741   TIBC 322 01/10/2015 0741   FERRITIN 37 01/10/2015 0741   IRONPCTSAT 8* 01/10/2015 0741   Urinalysis    Component Value Date/Time   COLORURINE YELLOW 01/10/2015 0116   APPEARANCEUR TURBID* 01/10/2015 0116   LABSPEC 1.025 01/10/2015 0116   PHURINE 7.0 01/10/2015 0116   GLUCOSEU NEGATIVE 01/10/2015 0116   HGBUR LARGE* 01/10/2015 0116   HGBUR trace-lysed 03/08/2010 1328   BILIRUBINUR NEGATIVE 01/10/2015 0116   KETONESUR NEGATIVE 01/10/2015 0116   PROTEINUR 100* 01/10/2015 0116   UROBILINOGEN 1.0 01/10/2015 0116   NITRITE NEGATIVE 01/10/2015 0116   LEUKOCYTESUR MODERATE* 01/10/2015 0116  - BNP 2273.8, unreliable due to CKD - Hepatitis B negative  Imaging/Diagnostic Tests: Dg Chest 1 View  01/19/2015   CLINICAL DATA:  Shortness of breath and weakness  EXAM: CHEST  1 VIEW  COMPARISON:  01/12/2015  FINDINGS: Dialysis catheter from right IJ approach is in stable position, tips in the right atrium. There is a new orogastric tube which enters the stomach, with side port likely at the lower margin of the image.  There is improved aeration since comparison. No edema, effusion, or pneumothorax.  Stable cardiomegaly.  IMPRESSION: 1. New nasogastric tube is in good position. 2. Interval clearing of previously noted opacities.   Electronically Signed   By: Jonathon  Watts M.D.   On: 01/19/2015 13:54   Nm Pulmonary Perfusion  01/19/2015   CLINICAL DATA:  Short of breath. Dialysis patient. Positive D-dimer. History of deep venous thrombosis.  EXAM: NUCLEAR MEDICINE   - PERFUSION LUNG SCAN  TECHNIQUE: Perfusion images were obtained in multiple projections after intravenous injection of Tc-99m MAA.  RADIOPHARMACEUTICALS:  3.0 mCi Tc-99m MAA.  COMPARISON:  Chest radiograph 01/12/2015, 01/19/2015  FINDINGS: There are no wedge-shaped peripheral perfusion defects to suggest acute pulmonary embolism.  IMPRESSION: No evidence of pulmonary embolism.   Electronically Signed   By: Stewart  Edmunds M.D.   On: 01/19/2015 12:57   Renal US - no evidence of hydronephrosis  [place] N , DO 01/23/2015, 7:32 AM PGY-1, [place] [facility] FPTS Intern pager: [phone], text pages welcome  

## 2015-01-24 DIAGNOSIS — D696 Thrombocytopenia, unspecified: Secondary | ICD-10-CM | POA: Insufficient documentation

## 2015-01-24 LAB — CBC
HEMATOCRIT: 28.3 % — AB (ref 36.0–46.0)
HEMOGLOBIN: 8.7 g/dL — AB (ref 12.0–15.0)
MCH: 28.2 pg (ref 26.0–34.0)
MCHC: 30.7 g/dL (ref 30.0–36.0)
MCV: 91.9 fL (ref 78.0–100.0)
Platelets: 62 10*3/uL — ABNORMAL LOW (ref 150–400)
RBC: 3.08 MIL/uL — AB (ref 3.87–5.11)
RDW: 19.6 % — ABNORMAL HIGH (ref 11.5–15.5)
WBC: 3 10*3/uL — ABNORMAL LOW (ref 4.0–10.5)

## 2015-01-24 LAB — GLUCOSE, CAPILLARY
GLUCOSE-CAPILLARY: 102 mg/dL — AB (ref 70–99)
GLUCOSE-CAPILLARY: 106 mg/dL — AB (ref 70–99)
Glucose-Capillary: 175 mg/dL — ABNORMAL HIGH (ref 70–99)

## 2015-01-24 LAB — BASIC METABOLIC PANEL
Anion gap: 13 (ref 5–15)
BUN: 7 mg/dL (ref 6–23)
CALCIUM: 8.4 mg/dL (ref 8.4–10.5)
CHLORIDE: 102 mmol/L (ref 96–112)
CO2: 21 mmol/L (ref 19–32)
CREATININE: 1.66 mg/dL — AB (ref 0.50–1.10)
GFR calc Af Amer: 34 mL/min — ABNORMAL LOW (ref >90)
GFR calc non Af Amer: 29 mL/min — ABNORMAL LOW (ref >90)
GLUCOSE: 151 mg/dL — AB (ref 70–99)
Potassium: 4 mmol/L (ref 3.5–5.1)
Sodium: 136 mmol/L (ref 135–145)

## 2015-01-24 LAB — PROTIME-INR
INR: 1.6 — ABNORMAL HIGH (ref 0.00–1.49)
Prothrombin Time: 19.2 seconds — ABNORMAL HIGH (ref 11.6–15.2)

## 2015-01-24 MED ORDER — WARFARIN SODIUM 5 MG PO TABS
5.0000 mg | ORAL_TABLET | Freq: Once | ORAL | Status: AC
  Administered 2015-01-24: 5 mg via ORAL
  Filled 2015-01-24: qty 1

## 2015-01-24 NOTE — Progress Notes (Signed)
Family Medicine Teaching Service Daily Progress Note Intern Pager: [phone]  Patient name: Kristi [name] Medical record number: [id number] Date of birth: 1941-05-06 Age: 74 y.o. Gender: female  Primary Care Provider: Alease Frame Marylynn Pearson, MD Consultants: CCM, Renal, Cardiology, GI Code Status: Full  Assessment and Plan: 74yo female with acute renal failure admitted for emergent HD and for symptomatic bradycardia. PMH includes hyperlipidemia, hyperparathyroidism, CAD, MI (2008), HTN, COPD, DM, Anemia, CKD, GERD, Arthritis, CHF, CVA, DVT, and PAH  # Acute on Chronic Kidney Disease, considered new ESRD - Followed by Renal - greatly appreciate recommendations - Received HD on 2/1, 2/2, 2/4, 2/6, 2/8, 2/10, 2/12, Dialysis schedule (MWF) - Creatinine 2.25>>2.63>>1.66, improved from 11.8 at admission.   - Outpatient dialysis center has been set-up (per SW)  # Atrial Fibrillation, New onset, questionable if paroxysmal - Controlled - On admission with symptomatic bradycardia (resolved s/p atropine x 1). Dx AFib evidenced on initial EKGs. ECHO with normal LV fxn. - Cardiology following - greatly appreciate recommendations - Continue Metoprolol 25mg  PO BID as diet allows - Coumadin per pharm - chronic anticoagulation per Cards recs - Discontinue ASA on discharge  # Thrombocytopenia - Platelet trend 72>>100>>62 - INR 2.23>>2.63>>1.60 - HIT panel 0.220 (normal) - Folate normal - Vitamin B12 >2000 - ANA negative - Peripheral Blood Smear- Polychromasia - Hold all heparin SQ and requested no heparin HD - Continue SCDs - Hematology consulted. Appreciate recommendations.  - Vitamin K 1mg  on 2/11  - DIC Panel: PT 24.5, INR 2.19, PTT 86, D Dimer >20, Fibrinogen 378  - UPEP - total protein 2747; alpha1/alpha2/beta/gamma DETECTED  - SPEP - total protein 5.8 (L), albumin 51.6 (L), alpha 1 10.4 (H), alpha 2 13.2 (H) - VQ scan showed no PE, Dopplers no sign of DVT   # Abd Pain / Distention / Nausea -  Likely d/t to massive distended stomach w/ possible gastric outlet obstruction - Diff dx includes duodenitis w/ stricture (known hx GERD) vs concern for obstructing malignancy vs. Electrolyte abnormality - Lipase 41, LFTs nml, Lactic acid 1.5 >> 1.2 - Identified on Abd X-ray 2/6, per radiology only partial obstruction - CT- diffuse gastric wall thickening, generally mild but more prominent in proximal stomach; mucosal thickening in region of the pylorus; could indicate gastritis or diffuse infiltrating neoplasm - Zofran PRN - Consult GI - (unassigned [facility]) - greatly appreciate recommendations for further work-up of partial gastric outlet obstruction.   - Most likely secondary to electrolyte abnormality from renal failure  - Endoscopy normal on 2/12  - NG tube discontinued 2/12. Advance diet as tolerated, currently on soft diet  FEN/GI: Clear Liquid Diet, ADAT PPx: SCDs  Disposition: Continue HD per Renal. Cardiology following for AFib and dCHF, starting anticoagulation.  Social work for short term rehab for patient. Discharge once rehab facility and dialysis center are found.  Subjective: No acute complaints overnight. Continues to feel well today. Denies nausea. No further concerns today. Looking forward to DC. (would like to be home by Sunday at latest).  Objective: Temp:  [97.4 F (36.3 C)-98.7 F (37.1 C)] 98.6 F (37 C) (02/16 0814) Pulse Rate:  [62-75] 66 (02/16 0814) Resp:  [16-20] 18 (02/16 0814) BP: (97-152)/(39-72) 97/39 mmHg (02/16 0814) SpO2:  [94 %-99 %] 97 % (02/16 0814) FiO2 (%):  [3 %] 3 % (02/15 1000) Weight:  [180 lb 12.4 oz (82 kg)-184 lb 8.4 oz (83.7 kg)] 180 lb 12.4 oz (82 kg) (02/15 2100) Physical Exam: General: 74yo female resting  comfortably in no apparent distress Cardiovascular: S1 and S2 noted. No murmurs/rubs/gallops. Regular rate and rhythm. Respiratory: CTAB. No wheezes or focal crackles. [place] in place. Abdomen: Soft and nondistended, non-tender on  exam, no rebound no guarding.  Extremities: No edema noted.  Laboratory:  Recent Labs Lab 01/22/15 0702 01/23/15 0500 01/24/15 0511  WBC 5.6 4.2 3.0*  HGB 8.6* 8.9* 8.7*  HCT 27.3* 27.1* 28.3*  PLT 72* 100* 62*    Recent Labs Lab 01/22/15 0702 01/23/15 0930 01/24/15 0511  NA 130* 130* 136  K 4.2 3.8 4.0  CL 99 98 102  CO2 18* 29 21  BUN 12 18 7   CREATININE 2.25* 2.63* 1.66*  CALCIUM 8.8 8.8 8.4  GLUCOSE 103* 96 151*   Iron/TIBC/Ferritin/ %Sat    Component Value Date/Time   IRON 27* 01/10/2015 0741   TIBC 322 01/10/2015 0741   FERRITIN 37 01/10/2015 0741   IRONPCTSAT 8* 01/10/2015 0741   Urinalysis    Component Value Date/Time   COLORURINE YELLOW 01/10/2015 0116   APPEARANCEUR TURBID* 01/10/2015 0116   LABSPEC 1.025 01/10/2015 0116   PHURINE 7.0 01/10/2015 0116   GLUCOSEU NEGATIVE 01/10/2015 0116   HGBUR LARGE* 01/10/2015 0116   HGBUR trace-lysed 03/08/2010 1328   BILIRUBINUR NEGATIVE 01/10/2015 0116   KETONESUR NEGATIVE 01/10/2015 0116   PROTEINUR 100* 01/10/2015 0116   UROBILINOGEN 1.0 01/10/2015 0116   NITRITE NEGATIVE 01/10/2015 0116   LEUKOCYTESUR MODERATE* 01/10/2015 0116  - BNP 2273.8, unreliable due to CKD - Hepatitis B negative  Imaging/Diagnostic Tests: No results found. Renal US - no evidence of hydronephrosis  Elberta Leatherwood, MD 01/24/2015, 9:40 AM PGY-1, [place] Intern pager: [phone], text pages welcome

## 2015-01-24 NOTE — Progress Notes (Signed)
Admit: 01/09/2015 LOS: 15  37F new ESRD this admit-  2/2 DM/HTN admitted with volume overload, malaise, uremia  Subjective:  Feels much improved, appears better- HD yesterday removed 2 liters tolerated well Not much UOP Was set up at [facility] TTS second but now going to [facility]- not sure if OP arrangements will need to change based on that  02/15 0701 - 02/16 0700 In: 720 [P.O.:720] Out: 2150 [Urine:150]  Filed Weights   01/22/15 2101 01/23/15 1333 01/23/15 2100  Weight: 85.3 kg (188 lb 0.8 oz) 83.7 kg (184 lb 8.4 oz) 82 kg (180 lb 12.4 oz)    Scheduled Meds: . arformoterol  15 mcg Nebulization BID  . budesonide  0.5 mg Nebulization BID  . calcium acetate  667 mg Oral TID WC  . darbepoetin (ARANESP) injection - DIALYSIS  100 mcg Intravenous Q Mon-HD  . doxercalciferol  2 mcg Intravenous Q T,Th,Sa-HD  . insulin aspart  0-15 Units Subcutaneous TID WC  . insulin aspart  0-5 Units Subcutaneous QHS  . ipratropium-albuterol  3 mL Nebulization TID  . metoprolol tartrate  25 mg Oral BID  . multivitamin  1 tablet Oral QHS  . senna-docusate  1 tablet Oral QHS  . Warfarin - Pharmacist Dosing Inpatient   Does not apply q1800   Continuous Infusions: . dextrose 5 % and 0.45% NaCl 75 mL/hr at 01/21/15 1217   PRN Meds:.acetaminophen, benzonatate, morphine injection, ondansetron (ZOFRAN) IV  Current Labs: reviewed    Physical Exam:  Blood pressure 97/39, pulse 66, temperature 98.6 F (37 C), temperature source Oral, resp. rate 18, height 5' (1.524 m), weight 82 kg (180 lb 12.4 oz), SpO2 97 %. NAD IRIR, no rub Diminished in bases No LEE +B/T of L RC AVF Extensive aging brusing of distal LUE Nonfocal  A/P 1. New ESRD:  1. Done Monday but has tentatively been set up for TTS as OP (unless changes based on disposition)-I think can wait til Thursday for next treatment whether it be here or as OP 2.  -- did have spot TTS at [facility] but now going to [facility] which may change plan 3. Has  PC- her left AVF failed after 2 uses, still with extensive bruising- VVS wanted elective fistulogram in 2 weeks from 2/4 2. N/V, gastric distension 1. Resolving, much improved 2. GI following 3. Hyperkalemia: resolved; now normokalemic 4. Vol O/L / Pulm HTN: stable volume status 1. Try 2L UF next HD, start working with weights now that PO intake improved 5. Anemia:  1. s/p 2u PRBC, appropriate increase 01/16/15 2. On ESA and IV Fe- low but stable 6. Atrial Fibrillation on warfarin 7. 2HPTH: cont VDRA and PhosLo  Deatrice Spanbauer A   01/24/2015, 10:07 AM   Recent Labs Lab 01/18/15 0500  01/22/15 0702 01/23/15 0930 01/24/15 0511  NA 145  < > 130* 130* 136  K 2.9*  < > 4.2 3.8 4.0  CL 104  < > 99 98 102  CO2 28  < > 18* 29 21  GLUCOSE 95  < > 103* 96 151*  BUN 33*  < > 12 18 7   CREATININE 3.56*  < > 2.25* 2.63* 1.66*  CALCIUM 9.3  < > 8.8 8.8 8.4  PHOS 4.2  --   --   --   --   < > = values in this interval not displayed.  Recent Labs Lab 01/18/15 0500  01/22/15 0702 01/23/15 0500 01/24/15 0511  WBC 8.9  < >  5.6 4.2 3.0*  NEUTROABS 6.9  --   --   --   --   HGB 9.7*  < > 8.6* 8.9* 8.7*  HCT 30.1*  < > 27.3* 27.1* 28.3*  MCV 89.9  < > 89.2 87.7 91.9  PLT 64*  < > 72* 100* 62*  < > = values in this interval not displayed.

## 2015-01-24 NOTE — Progress Notes (Signed)
Bp 97/39 paged Teaching service, pt to get metoprolol 25 mg po do they want to hold this am dose

## 2015-01-24 NOTE — Progress Notes (Signed)
Labs reviewed: Platelet count continued to improved up to 100. Her serum protein electrophoresis showed an M spike of 0.23 g/dL which is actually lower what it was in 2013. I doubt this is a sign of a progressive plasma cell disorder given her low M spike and normalizing platelet count. I suspect her platelet drop is related to acute illness which she is recovering from.  Please call with any questions regarding this very nice lady.  Results for TIAH, HECKEL (MRN [id number]) as of 01/24/2015 07:30  Ref. Range 01/29/2011 15:28 07/31/2011 08:53 02/14/2012 14:56 08/25/2012 10:33 01/19/2015 09:52  M-SPIKE, % Latest Units: g/dL 0.47 0.44 0.34 0.52 0.23

## 2015-01-24 NOTE — Progress Notes (Signed)
  ANTICOAGULATION CONSULT NOTE - Follow Up Consult  Pharmacy Consult for Coumadin Indication: atrial fibrillation  Allergies  Allergen Reactions  . Losartan Other (See Comments)    Causes increase in creatinine and worsening CKD  . Chantix [Varenicline] Nausea Only and Other (See Comments)    Dizzy and GI symptoms     Patient Measurements: Height: 5' (152.4 cm) Weight: 180 lb 12.4 oz (82 kg) IBW/kg (Calculated) : 45.5  Vital Signs: Temp: 98.6 F (37 C) (02/16 0814) Temp Source: Oral (02/16 0814) BP: 97/39 mmHg (02/16 0814) Pulse Rate: 66 (02/16 0814)  Labs:  Recent Labs  01/22/15 0702 01/23/15 0500 01/23/15 0930 01/24/15 0511  HGB 8.6* 8.9*  --  8.7*  HCT 27.3* 27.1*  --  28.3*  PLT 72* 100*  --  62*  LABPROT 24.9* 21.2*  --  19.2*  INR 2.23* 1.82*  --  1.60*  CREATININE 2.25*  --  2.63* 1.66*    Estimated Creatinine Clearance: 28.2 mL/min (by C-G formula based on Cr of 1.66).    Assessment: 74 yo F presents on 2/1 with weakness and SOB. Pharmacy dosing coumadin for Afib. Reversed with Vit K 1mg  IV 2/11 for EGD. EGD unremarkable. Restarted on 2/12 after holding because of a elevated INR after two 5mg  doses. INR continues downward trend to 1.6.  Platelets back down to 62 (low but relatively stable over past week). Hgb remains low but stable.  Noted plan to d/c to NH today.  Goal of Therapy:  INR 2-3 Monitor platelets by anticoagulation protocol: Yes   Plan:  -Coumadin 5mg  po again today -Daily PT/INR  Sherlon Handing, PharmD, BCPS Clinical pharmacist, pager [phone]  01/24/2015 10:49 AM

## 2015-01-25 LAB — GLUCOSE, CAPILLARY
GLUCOSE-CAPILLARY: 145 mg/dL — AB (ref 70–99)
GLUCOSE-CAPILLARY: 134 mg/dL — AB (ref 70–99)
GLUCOSE-CAPILLARY: 140 mg/dL — AB (ref 70–99)
GLUCOSE-CAPILLARY: 134 mg/dL — AB (ref 70–99)

## 2015-01-25 LAB — CBC
HEMATOCRIT: 29.4 % — AB (ref 36.0–46.0)
Hemoglobin: 9.1 g/dL — ABNORMAL LOW (ref 12.0–15.0)
MCH: 28.5 pg (ref 26.0–34.0)
MCHC: 31 g/dL (ref 30.0–36.0)
MCV: 92.2 fL (ref 78.0–100.0)
PLATELETS: 128 10*3/uL — AB (ref 150–400)
RBC: 3.19 MIL/uL — AB (ref 3.87–5.11)
RDW: 19.3 % — ABNORMAL HIGH (ref 11.5–15.5)
WBC: 3.5 10*3/uL — ABNORMAL LOW (ref 4.0–10.5)

## 2015-01-25 LAB — BASIC METABOLIC PANEL
Anion gap: 6 (ref 5–15)
BUN: 11 mg/dL (ref 6–23)
CHLORIDE: 101 mmol/L (ref 96–112)
CO2: 27 mmol/L (ref 19–32)
CREATININE: 2.23 mg/dL — AB (ref 0.50–1.10)
Calcium: 8.9 mg/dL (ref 8.4–10.5)
GFR calc non Af Amer: 21 mL/min — ABNORMAL LOW (ref >90)
GFR, EST AFRICAN AMERICAN: 24 mL/min — AB (ref >90)
Glucose, Bld: 147 mg/dL — ABNORMAL HIGH (ref 70–99)
POTASSIUM: 4.3 mmol/L (ref 3.5–5.1)
Sodium: 134 mmol/L — ABNORMAL LOW (ref 135–145)

## 2015-01-25 LAB — PROTIME-INR
INR: 1.47 (ref 0.00–1.49)
Prothrombin Time: 17.9 seconds — ABNORMAL HIGH (ref 11.6–15.2)

## 2015-01-25 MED ORDER — WARFARIN SODIUM 5 MG PO TABS: 5.0000 mg | ORAL_TABLET | Freq: Every day | ORAL | Status: DC

## 2015-01-25 MED ORDER — WARFARIN SODIUM 7.5 MG PO TABS
7.5000 mg | ORAL_TABLET | Freq: Once | ORAL | Status: DC
  Filled 2015-01-25: qty 1

## 2015-01-25 MED ORDER — ATORVASTATIN CALCIUM 40 MG PO TABS: 40.0000 mg | ORAL_TABLET | Freq: Every day | ORAL | Status: DC

## 2015-01-25 MED ORDER — WARFARIN SODIUM 5 MG PO TABS
5.0000 mg | ORAL_TABLET | Freq: Once | ORAL | Status: DC
  Filled 2015-01-25: qty 1

## 2015-01-25 NOTE — Progress Notes (Signed)
Pt left floor via stretcher by [facility]AR to [facility].

## 2015-01-25 NOTE — Progress Notes (Signed)
Admit: 01/09/2015 LOS: 62  62F new ESRD this admit-  2/2 DM/HTN admitted with volume overload, malaise, uremia  Subjective:  Feels much improved, up working with PT  Was set up at [facility] TTS second and is going to [facility] who says they can arrange transportation   02/16 0701 - 02/17 0700 In: 600 [P.O.:600] Out: 151 [Urine:150; Stool:1]  Filed Weights   01/23/15 1333 01/23/15 2100 01/24/15 2100  Weight: 83.7 kg (184 lb 8.4 oz) 82 kg (180 lb 12.4 oz) 82.4 kg (181 lb 10.5 oz)    Scheduled Meds: . arformoterol  15 mcg Nebulization BID  . budesonide  0.5 mg Nebulization BID  . calcium acetate  667 mg Oral TID WC  . darbepoetin (ARANESP) injection - DIALYSIS  100 mcg Intravenous Q Mon-HD  . doxercalciferol  2 mcg Intravenous Q T,Th,Sa-HD  . insulin aspart  0-15 Units Subcutaneous TID WC  . insulin aspart  0-5 Units Subcutaneous QHS  . ipratropium-albuterol  3 mL Nebulization TID  . metoprolol tartrate  25 mg Oral BID  . multivitamin  1 tablet Oral QHS  . senna-docusate  1 tablet Oral QHS  . Warfarin - Pharmacist Dosing Inpatient   Does not apply q1800   Continuous Infusions: . dextrose 5 % and 0.45% NaCl 75 mL/hr at 01/21/15 1217   PRN Meds:.acetaminophen, benzonatate, morphine injection, ondansetron (ZOFRAN) IV  Current Labs: reviewed    Physical Exam:  Blood pressure 126/51, pulse 63, temperature 98.2 F (36.8 C), temperature source Oral, resp. rate 16, height 5' (1.524 m), weight 82.4 kg (181 lb 10.5 oz), SpO2 98 %. NAD IRIR, no rub Diminished in bases No LEE +B/T of L RC AVF Extensive aging brusing of distal LUE Nonfocal  A/P 1. New ESRD:  1. Done Monday but has tentatively been set up for TTS as OP -I think can wait til Thursday for next treatment whether it be here or as OP at [facility] 2.  -- did have spot TTS at [facility] and is going to be able to keep that spot even going to SNF 3. Has PC- her left AVF failed after 2 uses, still with extensive  bruising- VVS wanted elective fistulogram in 2 weeks from 2/4 to eval 2. N/V, gastric distension 1. Resolving, much improved 2. GI following 3. Hyperkalemia: resolved; now normokalemic 4. Vol O/L / Pulm HTN: stable volume status 1.  start working with weights now that PO intake improved 5. Anemia:  1. s/p 2u PRBC, appropriate increase 01/16/15 2. On ESA and IV Fe- low but stable 6. Atrial Fibrillation on warfarin 7. 2HPTH: cont VDRA and PhosLo 8. Dispo- am Ok with discharge at any time- hopefully today   Deklen Popelka A   01/25/2015, 10:03 AM   Recent Labs Lab 01/23/15 0930 01/24/15 0511 01/25/15 0513  NA 130* 136 134*  K 3.8 4.0 4.3  CL 98 102 101  CO2 29 21 27   GLUCOSE 96 151* 147*  BUN 18 7 11   CREATININE 2.63* 1.66* 2.23*  CALCIUM 8.8 8.4 8.9    Recent Labs Lab 01/23/15 0500 01/24/15 0511 01/25/15 0513  WBC 4.2 3.0* 3.5*  HGB 8.9* 8.7* 9.1*  HCT 27.1* 28.3* 29.4*  MCV 87.7 91.9 92.2  PLT 100* 62* 128*

## 2015-01-25 NOTE — Consult Note (Signed)
Came to visit patient at bedside to discuss [place] [facility] services. Noted she is supposed to go to SNF at discharge. However, patient is currently working with physical therapy. Will have to come back at later time.  Marthenia Rolling, MSN- [facility] [phone]

## 2015-01-25 NOTE — Progress Notes (Signed)
Physical Therapy Treatment Patient Details Name: ERIK NESSEL MRN: [id number] DOB: 05/09/1941 Today's Date: 01/25/2015    History of Present Illness Pt is a 74 y.o. F with PMH as outlined below. She presented to [facility] ED 2/1 with generalized weakness and SOB. She has hx of chronic renal insufficiency (stage IV CKD) but has never required dialysis. She does however have a left wrist AV fistula which has never been accessed before. She states she has been feeling weak for quite some time now, for the past few weeks but has never seeked medical attention. On day of presentation, she felt much worse than before and due to worsening SOB over past 3 days, decided to come to ED for further evaluation.    PT Comments    Pt progressing towards physical therapy goals. Was able to progress gait training distance without report of SOB or pain. Chair follow utilized and pt took 1 seated rest break due to fatigue. Will continue to follow and progress as able per POC.   Follow Up Recommendations  SNF;Supervision/Assistance - 24 hour     Equipment Recommendations  None recommended by PT    Recommendations for Other Services       Precautions / Restrictions Precautions Precautions: Fall Restrictions Weight Bearing Restrictions: No    Mobility  Bed Mobility Overal bed mobility: Needs Assistance Bed Mobility: Supine to Sit;Sit to Supine     Supine to sit: Min guard Sit to supine: Min guard   General bed mobility comments: Pt was able to perform transfer to/from EOB with guarding for safety.  Transfers Overall transfer level: Needs assistance Equipment used: Rolling walker (2 wheeled) Transfers: Sit to/from Stand Sit to Stand: Min guard         General transfer comment: Pt was able to power-up to full standing with steadying assist only. Pt mostly able to correct balance independentlyq  Ambulation/Gait Ambulation/Gait assistance: Supervision Ambulation Distance (Feet): 125  Feet Assistive device: Rolling walker (2 wheeled) Gait Pattern/deviations: Step-through pattern;Decreased stride length;Trunk flexed Gait velocity: Decreased Gait velocity interpretation: Below normal speed for age/gender General Gait Details: Pt ambulated on 2L/min supplemental O2 and maintained O2 sats 90-92%. 1 seated rest break and chair follow utilized.    Stairs            Wheelchair Mobility    Modified Rankin (Stroke Patients Only)       Balance Overall balance assessment: Needs assistance Sitting-balance support: Feet supported;No upper extremity supported Sitting balance-Leahy Scale: Fair     Standing balance support: No upper extremity supported Standing balance-Leahy Scale: Fair                      Cognition Arousal/Alertness: Awake/alert Behavior During Therapy: WFL for tasks assessed/performed Overall Cognitive Status: Within Functional Limits for tasks assessed                      Exercises      General Comments        Pertinent Vitals/Pain Pain Assessment: No/denies pain    Home Living                      Prior Function            PT Goals (current goals can now be found in the care plan section) Acute Rehab PT Goals Patient Stated Goal: Return home after rehab PT Goal Formulation: With patient Time For Goal Achievement: 01/31/15 Potential to  Achieve Goals: Good Progress towards PT goals: Progressing toward goals    Frequency  Min 2X/week    PT Plan Current plan remains appropriate    Co-evaluation             End of Session Equipment Utilized During Treatment: Gait belt;Oxygen Activity Tolerance: Patient tolerated treatment well Patient left: in bed;with call bell/phone within reach     Time: 0942-1005 PT Time Calculation (min) (ACUTE ONLY): 23 min  Charges:  $Gait Training: 8-22 mins $Therapeutic Activity: 8-22 mins                    G Codes:      Rolinda Roan February 07, 2015, 1:34 PM   Rolinda Roan, PT, DPT Acute Rehabilitation Services Pager: [phone]

## 2015-01-25 NOTE — Progress Notes (Signed)
Called Report to [facility], Lovey, LPN.

## 2015-01-25 NOTE — Consult Note (Signed)
Came back to visit patient at bedside to offer [place] [facility] services and for her to be followed by [facility] Licensed CSW at [facility]. Consents obtained. Patient appreciative of visit. Made inpatient RNCM aware. Left [facility] Care Management packet at bedside with patient.  [facility] [phone]

## 2015-01-26 ENCOUNTER — Ambulatory Visit: Payer: Commercial Managed Care - HMO | Admitting: Interventional Cardiology

## 2015-01-30 ENCOUNTER — Encounter: Payer: Self-pay | Admitting: Interventional Cardiology

## 2015-02-13 ENCOUNTER — Telehealth: Payer: Self-pay | Admitting: Family Medicine

## 2015-02-13 ENCOUNTER — Ambulatory Visit: Payer: Commercial Managed Care - HMO | Admitting: Family Medicine

## 2015-02-13 NOTE — Telephone Encounter (Signed)
Vein and Vascular called and need a silverback done for the patient. jw

## 2015-02-13 NOTE — Telephone Encounter (Signed)
Silverback auth obtained [phone]

## 2015-02-14 ENCOUNTER — Other Ambulatory Visit: Payer: Self-pay | Admitting: *Deleted

## 2015-02-14 DIAGNOSIS — T82510A Breakdown (mechanical) of surgically created arteriovenous fistula, initial encounter: Secondary | ICD-10-CM

## 2015-02-14 DIAGNOSIS — Z01810 Encounter for preprocedural cardiovascular examination: Secondary | ICD-10-CM

## 2015-02-16 ENCOUNTER — Encounter: Payer: Self-pay | Admitting: Vascular Surgery

## 2015-02-16 ENCOUNTER — Telehealth: Payer: Self-pay | Admitting: *Deleted

## 2015-02-16 NOTE — Telephone Encounter (Signed)
Kristi [name] (home health nurse), calls requesting to speak with MD regarding the plan of care for this patient since last hospital admission. She also needs to know who she should be calling PT/INR results to (has one scheduled for Monday). Will forward to PCP

## 2015-02-17 ENCOUNTER — Ambulatory Visit (INDEPENDENT_AMBULATORY_CARE_PROVIDER_SITE_OTHER)
Admission: RE | Admit: 2015-02-17 | Discharge: 2015-02-17 | Disposition: A | Discharge status: Home / Self Care | Payer: Commercial Managed Care - HMO | Source: Ambulatory Visit | Attending: Vascular Surgery | Admitting: Vascular Surgery

## 2015-02-17 ENCOUNTER — Ambulatory Visit (INDEPENDENT_AMBULATORY_CARE_PROVIDER_SITE_OTHER): Payer: Commercial Managed Care - HMO | Admitting: Vascular Surgery

## 2015-02-17 ENCOUNTER — Ambulatory Visit (HOSPITAL_COMMUNITY)
Admission: RE | Admit: 2015-02-17 | Discharge: 2015-02-17 | Disposition: A | Discharge status: Home / Self Care | Payer: Commercial Managed Care - HMO | Source: Ambulatory Visit | Attending: Vascular Surgery | Admitting: Vascular Surgery

## 2015-02-17 ENCOUNTER — Other Ambulatory Visit: Payer: Self-pay

## 2015-02-17 ENCOUNTER — Encounter: Payer: Self-pay | Admitting: Vascular Surgery

## 2015-02-17 ENCOUNTER — Telehealth: Payer: Self-pay | Admitting: Family Medicine

## 2015-02-17 VITALS — BP 112/50 | HR 65 | Ht 60.0 in | Wt 166.2 lb

## 2015-02-17 DIAGNOSIS — E119 Type 2 diabetes mellitus without complications: Secondary | ICD-10-CM | POA: Insufficient documentation

## 2015-02-17 DIAGNOSIS — I509 Heart failure, unspecified: Secondary | ICD-10-CM | POA: Diagnosis not present

## 2015-02-17 DIAGNOSIS — T82510A Breakdown (mechanical) of surgically created arteriovenous fistula, initial encounter: Secondary | ICD-10-CM

## 2015-02-17 DIAGNOSIS — I120 Hypertensive chronic kidney disease with stage 5 chronic kidney disease or end stage renal disease: Secondary | ICD-10-CM | POA: Diagnosis not present

## 2015-02-17 DIAGNOSIS — Z794 Long term (current) use of insulin: Secondary | ICD-10-CM | POA: Insufficient documentation

## 2015-02-17 DIAGNOSIS — Z79899 Other long term (current) drug therapy: Secondary | ICD-10-CM | POA: Diagnosis not present

## 2015-02-17 DIAGNOSIS — N186 End stage renal disease: Secondary | ICD-10-CM

## 2015-02-17 DIAGNOSIS — Z7901 Long term (current) use of anticoagulants: Secondary | ICD-10-CM | POA: Diagnosis not present

## 2015-02-17 DIAGNOSIS — I252 Old myocardial infarction: Secondary | ICD-10-CM | POA: Diagnosis not present

## 2015-02-17 DIAGNOSIS — E785 Hyperlipidemia, unspecified: Secondary | ICD-10-CM | POA: Insufficient documentation

## 2015-02-17 DIAGNOSIS — Z01810 Encounter for preprocedural cardiovascular examination: Secondary | ICD-10-CM | POA: Diagnosis present

## 2015-02-17 DIAGNOSIS — K219 Gastro-esophageal reflux disease without esophagitis: Secondary | ICD-10-CM | POA: Insufficient documentation

## 2015-02-17 DIAGNOSIS — Z87891 Personal history of nicotine dependence: Secondary | ICD-10-CM | POA: Insufficient documentation

## 2015-02-17 DIAGNOSIS — Z992 Dependence on renal dialysis: Secondary | ICD-10-CM | POA: Diagnosis not present

## 2015-02-17 DIAGNOSIS — I272 Other secondary pulmonary hypertension: Secondary | ICD-10-CM | POA: Insufficient documentation

## 2015-02-17 DIAGNOSIS — I2510 Atherosclerotic heart disease of native coronary artery without angina pectoris: Secondary | ICD-10-CM | POA: Diagnosis not present

## 2015-02-17 NOTE — Progress Notes (Signed)
[facility] [place]  Patient name: Kristi [name] MRN: [id number] DOB: 1941/02/06 Sex: female  EASON FOR VISIT: Poorly functioning left radiocephalic AV fistula  HPI:  Kristi [name] is a 74 y.o. female who had a left radiocephalic fistula placed in April 2012. I saw her last in September 2013 which time it was working well. She did not begin dialysis until just recently and may have had some problems cannulating her fistula. This reason, recently she had a tunneled dialysis catheter placed which she uses for dialysis on Tuesdays Thursdays and Saturdays. He denies any recent uremic symptoms. Separately she denies nausea, vomiting, fatigue, anorexia, or palpitations.  She denies any pain associated with her fistula in the left forearm.   Past Medical History  Diagnosis Date  . Hyperlipidemia   . Hyperparathyroidism   . CAD (coronary artery disease)   . Myocardial infarction 2008  . Hypertension   . COPD (chronic obstructive pulmonary disease)   . Diabetes mellitus     diagnosed 2010 - takes oral meds and lantus insulin  . Anemia   . Chronic kidney disease     esrd - since 2010  . GERD (gastroesophageal reflux disease)     on omeprazole  . Arthritis     all over  . CHF (congestive heart failure) 08-2011  . CVA 02/19/2010  . CAD 02/19/2010  . DEEP VENOUS THROMBOPHLEBITIS, LEG, LEFT 03/08/2010    in 2009 s/p hospitalization- only 1 isolated dvt  . Pulmonary artery hypertension 10/31/2014   Family History  Problem Relation Age of Onset  . Cancer Mother     multiple myloma  . Dementia Father   . Pneumonia Father   . Anuerysm Father    SOCIAL HISTORY: History  Substance Use Topics  . Smoking status: Former Smoker -- 0.50 packs/day for 53 years    Types: Cigarettes    Start date: 12/09/1960    Quit date: 03/31/2014  . Smokeless tobacco: Never Used  . Alcohol Use: 0.0 oz/week    0 Standard drinks or equivalent per week   Allergies  Allergen  Reactions  . Losartan Other (See Comments)    Causes increase in creatinine and worsening CKD  . Chantix [Varenicline] Nausea Only and Other (See Comments)    Dizzy and GI symptoms    Current Outpatient Prescriptions  Medication Sig Dispense Refill  . acetaminophen (TYLENOL) 500 MG tablet Take 1,000 mg by mouth every 6 (six) hours as needed.    Marland Kitchen acetaminophen-codeine (TYLENOL #3) 300-30 MG per tablet 1 tablet every 6 (six) hours as needed for moderate pain.     Marland Kitchen albuterol (PROVENTIL HFA) 108 (90 BASE) MCG/ACT inhaler Inhale 2 puffs into the lungs every 4 (four) hours as needed. For wheezing 8.5 g 2  . allopurinol (ZYLOPRIM) 300 MG tablet Take 300 mg by mouth daily.  5  . amLODipine (NORVASC) 5 MG tablet Take 1 tablet (5 mg total) by mouth daily. 90 tablet 3  . atorvastatin (LIPITOR) 40 MG tablet Take 1 tablet (40 mg total) by mouth daily. 30 tablet 0  . [facility] ALLERGY RELIEF 180 MG tablet TAKE 1 TABLET BY MOUTH EVERY DAY (Patient taking differently: TAKE 1 TABLET BY MOUTH as needed for allergies) 90 tablet 3  . furosemide (LASIX) 80 MG tablet Take 1 tablet (80 mg total) by mouth daily. (Patient taking differently: Take 80-160 mg by mouth 2 (two) times daily. Takes 160mg  in am and 80 or 160mg depending in pm)  30 tablet 0  . glipiZIDE (GLUCOTROL XL) 5 MG 24 hr tablet Take 1 tablet (5 mg total) by mouth daily with breakfast. (Patient taking differently: Take 5 mg by mouth every evening. ) 30 tablet 11  . glucosamine-chondroitin 500-400 MG tablet Take 1 tablet by mouth 2 (two) times daily.      Marland Kitchen glucose blood (ACCU-CHEK SMARTVIEW) test strip 1 each by Other route 2 (two) times daily. Use as instructed 100 each 12  . HYDROcodone-acetaminophen (NORCO/VICODIN) 5-325 MG per tablet Take 1 tablet by mouth every 12 (twelve) hours as needed for moderate pain. 60 tablet 0  . insulin glargine (LANTUS) 100 UNIT/ML injection Inject 0.13 mLs (13 Units total) into the skin every morning. If blood sugar is >140 add  1 unit. If blood sugar is <140 subtract 1 unit. 10 mL 2  . Lancets (ACCU-CHEK SOFT TOUCH) lancets 1 each by Other route 2 (two) times daily. Use as instructed 100 each 12  . metoprolol succinate (TOPROL-XL) 25 MG 24 hr tablet Take 0.5 tablets (12.5 mg total) by mouth daily. 90 tablet 3  . mometasone-formoterol (DULERA) 100-5 MCG/ACT AERO Inhale 2 puffs into the lungs 2 (two) times daily. (Patient taking differently: Inhale 2 puffs into the lungs as needed for shortness of breath. ) 13 g 1  . nitroGLYCERIN (NITROSTAT) 0.4 MG SL tablet Place 0.4 mg under the tongue every 5 (five) minutes as needed. For chest pain     Call 911 if pain not relieved by 3rd tab    . NON FORMULARY Place 2 L into the nose continuous. 2 liters of o2    . Omega-3 Fatty Acids (FISH OIL) 1200 MG CAPS Take 2 capsules by mouth 2 (two) times daily.    Marland Kitchen omeprazole (PRILOSEC) 20 MG capsule TAKE ONE CAPSULE BY MOUTH AT BEDTIME 90 capsule 3  . oxyCODONE-acetaminophen (PERCOCET/ROXICET) 5-325 MG per tablet Take 1 tablet by mouth 3 (three) times daily as needed for severe pain.    Vladimir Faster Glycol-Propyl Glycol (SYSTANE OP) Apply 1-2 drops to eye 4 (four) times daily as needed. For dry eyes    . Tiotropium Bromide Monohydrate (SPIRIVA RESPIMAT) 2.5 MCG/ACT AERS Inhale 2 puffs once daily (Patient taking differently: Inhale 2 puffs into the lungs as needed. Inhale 2 puffs once daily) 1 Inhaler 11  . vitamin B-12 (CYANOCOBALAMIN) 1000 MCG tablet Take 1,000 mcg by mouth every morning.     . warfarin (COUMADIN) 5 MG tablet Take 1 tablet (5 mg total) by mouth daily. TAKE 7.5mg  (1.5 tablets) on day 1 (tonight). Then 1 tablet daily after that. 30 tablet 0  . allopurinol (ZYLOPRIM) 100 MG tablet TAKE 2 TABLETS BY MOUTH EVERY DAY (Patient not taking: Reported on 02/17/2015) 60 tablet 3   No current facility-administered medications for this visit.   REVIEW OF SYSTEMS: Valu.Nieves ] denotes positive finding; [  ] denotes negative finding    CARDIOVASCULAR:  [ ]  chest pain   [ ]  chest pressure   [ ]  palpitations   [ ]  orthopnea   [ ]  dyspnea on exertion   [ ]  claudication   [ ]  rest pain   [ ]  DVT   [ ]  phlebitis PULMONARY:   [ ]  productive cough   [ ]  asthma   [ ]  wheezing NEUROLOGIC:   Valu.Nieves ] weakness  [ ]  paresthesias  [ ]  aphasia  [ ]  amaurosis  [ ]  dizziness HEMATOLOGIC:   [ ]  bleeding problems   [ ]   clotting disorders MUSCULOSKELETAL:  [ ]  joint pain   [ ]  joint swelling [ ]  leg swelling GASTROINTESTINAL: [ ]   blood in stool  [ ]   hematemesis GENITOURINARY:  [ ]   dysuria  [ ]   hematuria PSYCHIATRIC:  [ ]  history of major depression INTEGUMENTARY:  [ ]  rashes  [ ]  ulcers CONSTITUTIONAL:  [ ]  fever   [ ]  chills  PHYSICAL EXAM: Filed Vitals:   02/17/15 0957  BP: 112/50  Pulse: 65  Height: 5' (1.524 m)  Weight: 166 lb 3.2 oz (75.388 kg)  SpO2: 97%   Body mass index is 32.46 kg/(m^2). GENERAL: The patient is a well-nourished female, in no acute distress. The vital signs are documented above. CARDIOVASCULAR: There is a regular rate and rhythm. Do not detect carotid bruits. She has palpable radial pulses. She has a good thrill in her proximal left radiocephalic fistula although the vein is more difficult to follow distally. The vein is tortuous and fairly mobile. PULMONARY: There is good air exchange bilaterally without wheezing or rales. ABDOMEN: Soft and non-tender with normal pitched bowel sounds.  MUSCULOSKELETAL: There are no major deformities or cyanosis. NEUROLOGIC: No focal weakness or paresthesias are detected. SKIN: There are no ulcers or rashes noted. PSYCHIATRIC: The patient has a normal affect.  DATA:  I have an acutely interpreted her duplex of her fistula which shows the diameters range from 0.64-0.70 cm. The forearm cephalic vein on the left in these into the basilic system. The upper arm cephalic vein is pretty small on the left. I've also reviewed her vein map on the right upper extremity which shows that  her forearm and upper arm cephalic vein on the right is fairly small. Basilic vein on the right looks reasonable in size.  MEDICAL ISSUES:  POORLY FUNCTIONING LEFT RADIOCEPHALIC AV FISTULA: The fistula is very tortuous and I think it will be difficult to cannulate. Therefore I would recommend placement of new access. Based on her vein mapping, it appears that the best option would be a left basilic vein transposition. I have explained the indications for placement of an AV fistula or AV graft. I've explained that if at all possible we will place an AV fistula.  I have reviewed the risks of placement of an AV fistula including but not limited to: failure of the fistula to mature, need for subsequent interventions, and thrombosis. In addition I have reviewed the potential complications of placement of an AV graft. These risks include, but are not limited to, graft thrombosis, graft infection, wound healing problems, bleeding, arm swelling, and steal syndrome. All the patient's questions were answered and they are agreeable to proceed with surgery. This has been scheduled for 03/03/2015.    [place] [facility] [place] Beeper: [phone]

## 2015-02-17 NOTE — Telephone Encounter (Signed)
addressed this w/ phone call to [facility]

## 2015-02-17 NOTE — Telephone Encounter (Signed)
[facility] called to inform the doctor that PT starts today, OT starts on Monday, Home health starts on Monday and will be 2 times a week for 3 weeks and skilled nursing will be 1 time a week for 4 weeks. jw

## 2015-02-17 NOTE — Telephone Encounter (Signed)
[employer]. Was able to discuss plan with her which she and I both agreed on.

## 2015-02-20 ENCOUNTER — Telehealth: Payer: Self-pay | Admitting: Family Medicine

## 2015-02-20 NOTE — Telephone Encounter (Signed)
Needs verbal order for pt from clinical staff or PCP / sr

## 2015-02-20 NOTE — Telephone Encounter (Signed)
Left message on voicemail for Kristi [name] to call back and let us know what the verbal orders were for?

## 2015-02-23 NOTE — Telephone Encounter (Signed)
Left another message on voicemail

## 2015-02-24 NOTE — Telephone Encounter (Signed)
Returning Asha's call, asking for her to call back and leave information on his secure vm, asking for OT, sees patient once weekly for four weeks.

## 2015-02-24 NOTE — Telephone Encounter (Signed)
Will await call back from [place].

## 2015-02-24 NOTE — Telephone Encounter (Signed)
Are you ok with me giving a verbal for this?

## 2015-02-25 NOTE — Telephone Encounter (Signed)
Yes! Sorry about the delay!

## 2015-02-27 NOTE — Telephone Encounter (Signed)
Left message on vm giving verbal authorization for OT.

## 2015-03-09 ENCOUNTER — Other Ambulatory Visit: Payer: Self-pay | Admitting: Family Medicine

## 2015-03-09 ENCOUNTER — Telehealth: Payer: Self-pay | Admitting: Family Medicine

## 2015-03-09 DIAGNOSIS — G8929 Other chronic pain: Secondary | ICD-10-CM

## 2015-03-09 DIAGNOSIS — M810 Age-related osteoporosis without current pathological fracture: Secondary | ICD-10-CM

## 2015-03-09 DIAGNOSIS — M549 Dorsalgia, unspecified: Principal | ICD-10-CM

## 2015-03-09 NOTE — Telephone Encounter (Signed)
Need rx for patient to receive a shower chair with back and arm rest.  Need to specifiy this on rx.  Present vender that ins co only reimburse for durable equiment is [employer].   Need rx to be faxed with demographic sheet attached .  Present info for fax will be called back to office.

## 2015-03-09 NOTE — Telephone Encounter (Signed)
I sent in a DME order for this. If this does not work/is not the correct order than I'm going to need some help or some paperwork to get this in correctly. But I've done what I can so far.  Thanks! -I

## 2015-03-13 ENCOUNTER — Encounter: Payer: Self-pay | Admitting: Family Medicine

## 2015-03-13 ENCOUNTER — Ambulatory Visit (INDEPENDENT_AMBULATORY_CARE_PROVIDER_SITE_OTHER): Payer: Commercial Managed Care - HMO | Admitting: Family Medicine

## 2015-03-13 VITALS — BP 107/62 | HR 94 | Temp 98.2°F | Ht 60.0 in | Wt 167.0 lb

## 2015-03-13 DIAGNOSIS — E119 Type 2 diabetes mellitus without complications: Secondary | ICD-10-CM

## 2015-03-13 DIAGNOSIS — Z794 Long term (current) use of insulin: Secondary | ICD-10-CM

## 2015-03-13 DIAGNOSIS — N186 End stage renal disease: Secondary | ICD-10-CM

## 2015-03-13 DIAGNOSIS — Z992 Dependence on renal dialysis: Secondary | ICD-10-CM

## 2015-03-13 NOTE — Assessment & Plan Note (Signed)
Patient here for pre-op visit for a fistula placement 2/2 her ESRD requiring HD. From our discussion patient paints a picture which she has been feeling well with some mild increase in fatigue over the past 2 months. Otherwise asymptomatic. Continues to be ambulatory. METS ~4-3 w/o complication. - I would place patient in the category of Low risk for major adverse cardiac events (MACE).

## 2015-03-13 NOTE — Progress Notes (Signed)
   HPI  Patient presents today for preop physical  Patient will be undergoing surgery for a fistula placement for HD on 4/15. She reports feeling well of late. Has some vague complaints of fatigue which has started since the onset of HD in February. Patient reports no recent symptoms of SOB, CP, DOE, orthopnea, HA, dizziness, or lightheadedness. Physical activity is limited 2/2 supplemental O2 requirement but it is estimated that patient can tolerate ~3-4 METS.   Patient has a hx significant for prior stent placement in 2008 which she reportedly tolerated well. She reports little/no complication from prior anesthesia required procedures.   Smoking status noted ROS: Per HPI  Objective: BP 107/62 mmHg  Pulse 94  Temp(Src) 98.2 F (36.8 C) (Oral)  Ht 5' (1.524 m)  Wt 167 lb (75.751 kg)  BMI 32.62 kg/m2 Gen: NAD, alert, cooperative with exam, able to ambulate unassisted for short distances HEENT: NCAT, EOMI CV: RRR, no murmur Resp: CTAB, no wheezes, non-labored Abd: SNTND, BS present, no guarding or organomegaly Ext: No edema, warm, dry flakey skin on legs bilat. Neuro: Alert and oriented, No gross deficits  Assessment and plan:  ESRD needing dialysis Patient here for pre-op visit for a fistula placement 2/2 her ESRD requiring HD. From our discussion patient paints a picture which she has been feeling well with some mild increase in fatigue over the past 2 months. Otherwise asymptomatic. Continues to be ambulatory. METS ~0-3 w/o complication. - I would place patient in the category of Low risk for major adverse cardiac events (MACE).    Elberta Leatherwood, MD,MS,  PGY1 03/13/2015 6:56 PM

## 2015-03-13 NOTE — Patient Instructions (Addendum)
It was a pleasure seeing you today in our clinic. Today we discussed your surgery. Here is the treatment plan we have discussed and agreed upon together:   - I believe you will tolerate this upcoming surgery well. I have placed you as a "low-risk" for this surgery. - Normally you surgeon will tell you what he would like for you to do about your diabetes medicines the day of surgery. If he/she has not, I would like for you to take only 8 units of your Lantus while you are unable to eat before surgery, and I recommend not taking your novolog that day either. Continue to check your blood glucose levels during this time incase your sugars become uncontrolled -- if they do, you can call our office to be connected to the overnight physician on staff for advice as to what to do about these blood sugars.

## 2015-03-15 ENCOUNTER — Ambulatory Visit (INDEPENDENT_AMBULATORY_CARE_PROVIDER_SITE_OTHER): Payer: Commercial Managed Care - HMO | Admitting: Interventional Cardiology

## 2015-03-15 ENCOUNTER — Encounter: Payer: Self-pay | Admitting: Interventional Cardiology

## 2015-03-15 ENCOUNTER — Telehealth: Payer: Self-pay

## 2015-03-15 ENCOUNTER — Telehealth: Payer: Self-pay | Admitting: Family Medicine

## 2015-03-15 VITALS — BP 130/64 | HR 85 | Ht 60.0 in | Wt 161.8 lb

## 2015-03-15 DIAGNOSIS — I482 Chronic atrial fibrillation, unspecified: Secondary | ICD-10-CM

## 2015-03-15 DIAGNOSIS — I2510 Atherosclerotic heart disease of native coronary artery without angina pectoris: Secondary | ICD-10-CM

## 2015-03-15 DIAGNOSIS — I10 Essential (primary) hypertension: Secondary | ICD-10-CM

## 2015-03-15 DIAGNOSIS — I252 Old myocardial infarction: Secondary | ICD-10-CM

## 2015-03-15 NOTE — Progress Notes (Signed)
Patient ID: Kristi [name], female   DOB: 08/26/1941, 74 y.o.   MRN: [id number]     Cardiology Office Note   Date:  03/15/2015   ID:  Kristi [name], Kristi [name] [birth year], MRN [id number]  PCP:  Elberta Leatherwood, MD    Chief Complaint  Patient presents with  . [place]     Wt Readings from Last 3 Encounters:  03/15/15 161 lb 12.8 oz (73.392 kg)  03/13/15 167 lb (75.751 kg)  02/17/15 166 lb 3.2 oz (75.388 kg)       History of Present Illness: Kristi [name] is a 74 y.o. female  who had an inferior MI in 06/2007. She has renal insufficiency and had a fistula placed but this as stopped working. She has started dialysis T, Th, Sa.  No chest pain or SHOB.  Little walking at this point; no SHOB with supplemental oxygen.  She denies: Nitroglycerin use, Orthopnea, Palpitations, Syncope.   Since dialysis started, she has lost 35-40 lbs.  She lost most of it while she was in the hospital and intubated.  She gets full easier. She had gastric outlet obstruction.   She had AFib in the hospital and is on Coumadin at this point.  She will be having a new fistula placed in a week.    Past Medical History  Diagnosis Date  . Hyperlipidemia   . Hyperparathyroidism   . CAD (coronary artery disease)   . Myocardial infarction 2008  . Hypertension   . COPD (chronic obstructive pulmonary disease)   . Diabetes mellitus     diagnosed 2010 - takes oral meds and lantus insulin  . Anemia   . Chronic kidney disease     esrd - since 2010  . GERD (gastroesophageal reflux disease)     on omeprazole  . Arthritis     all over  . CHF (congestive heart failure) 08-2011  . CVA 02/19/2010  . CAD 02/19/2010  . DEEP VENOUS THROMBOPHLEBITIS, LEG, LEFT 03/08/2010    in 2009 s/p hospitalization- only 1 isolated dvt  . Pulmonary artery hypertension 10/31/2014    Past Surgical History  Procedure Laterality Date  . Av fistula placement  04/05/11    Left Radiocephalic AVF  . Appendectomy  1969  . Tubal  ligation    . Revision of fisturl    . Coronary angioplasty with stent placement  06/23/2007    by Dr. Irish Lack is her cardiologist  . Ligation goretex fistula  10/29/2011    Procedure: LIGATION GORE-TEX FISTULA;  Surgeon: Elam Dutch, MD;  Location: [facility] OR;  Service: Vascular;  Laterality: Left;  Ligation of competing branches left forearm fistula  . Fistulogram N/A 02/17/2012    Procedure: FISTULOGRAM;  Surgeon: Angelia Mould, MD;  Location: [facility] CATH LAB;  Service: Cardiovascular;  Laterality: N/A;  . Insertion of dialysis catheter N/A 01/12/2015    Procedure: INSERTION OF DIALYSIS CATHETER;  Surgeon: Conrad [place], MD;  Location: [place];  Service: Vascular;  Laterality: N/A;  . Esophagogastroduodenoscopy N/A 01/20/2015    Procedure: ESOPHAGOGASTRODUODENOSCOPY (EGD);  Surgeon: Wonda Horner, MD;  Location: [facility] ENDOSCOPY;  Service: Endoscopy;  Laterality: N/A;     Current Outpatient Prescriptions  Medication Sig Dispense Refill  . acetaminophen (TYLENOL) 500 MG tablet Take 1,000 mg by mouth every 6 (six) hours as needed.    Marland Kitchen acetaminophen-codeine (TYLENOL #3) 300-30 MG per tablet 1 tablet every 6 (six) hours as needed for moderate pain.     Marland Kitchen  albuterol (PROVENTIL HFA) 108 (90 BASE) MCG/ACT inhaler Inhale 2 puffs into the lungs every 4 (four) hours as needed. For wheezing 8.5 g 2  . allopurinol (ZYLOPRIM) 300 MG tablet Take 300 mg by mouth daily.  5  . amLODipine (NORVASC) 5 MG tablet Take 1 tablet (5 mg total) by mouth daily. 90 tablet 3  . atorvastatin (LIPITOR) 40 MG tablet Take 1 tablet (40 mg total) by mouth daily. 30 tablet 0  . calcitRIOL (ROCALTROL) 0.25 MCG capsule Take 0.25 mcg by mouth 3 (three) times a week. Monday, Wednesday, Friday  5  . [employer] ALLERGY RELIEF 180 MG tablet TAKE 1 TABLET BY MOUTH EVERY DAY (Patient taking differently: TAKE 1 TABLET BY MOUTH as needed for allergies) 90 tablet 3  . furosemide (LASIX) 80 MG tablet Take 1 tablet (80 mg total) by mouth daily.  (Patient taking differently: Take 80-160 mg by mouth 2 (two) times daily. Takes 160mg  in am and 80 or 160mg depending in pm) 30 tablet 0  . glipiZIDE (GLUCOTROL XL) 5 MG 24 hr tablet Take 1 tablet (5 mg total) by mouth daily with breakfast. (Patient taking differently: Take 5 mg by mouth every evening. ) 30 tablet 11  . glucosamine-chondroitin 500-400 MG tablet Take 1 tablet by mouth 2 (two) times daily.      Marland Kitchen glucose blood (ACCU-CHEK SMARTVIEW) test strip 1 each by Other route 2 (two) times daily. Use as instructed 100 each 12  . HYDROcodone-acetaminophen (NORCO/VICODIN) 5-325 MG per tablet Take 1 tablet by mouth every 12 (twelve) hours as needed for moderate pain. 60 tablet 0  . insulin glargine (LANTUS) 100 UNIT/ML injection Inject 0.13 mLs (13 Units total) into the skin every morning. If blood sugar is >140 add 1 unit. If blood sugar is <140 subtract 1 unit. 10 mL 2  . Lancets (ACCU-CHEK SOFT TOUCH) lancets 1 each by Other route 2 (two) times daily. Use as instructed 100 each 12  . metoprolol succinate (TOPROL-XL) 25 MG 24 hr tablet Take 0.5 tablets (12.5 mg total) by mouth daily. 90 tablet 3  . mometasone-formoterol (DULERA) 100-5 MCG/ACT AERO Inhale 2 puffs into the lungs 2 (two) times daily. (Patient taking differently: Inhale 2 puffs into the lungs as needed for shortness of breath. ) 13 g 1  . nitroGLYCERIN (NITROSTAT) 0.4 MG SL tablet Place 0.4 mg under the tongue every 5 (five) minutes as needed. For chest pain     Call 911 if pain not relieved by 3rd tab    . NON FORMULARY Place 2 L into the nose continuous. 2 liters of o2    . Omega-3 Fatty Acids (FISH OIL) 1200 MG CAPS Take 2 capsules by mouth 2 (two) times daily.    Marland Kitchen omeprazole (PRILOSEC) 20 MG capsule TAKE ONE CAPSULE BY MOUTH AT BEDTIME 90 capsule 3  . oxyCODONE-acetaminophen (PERCOCET/ROXICET) 5-325 MG per tablet Take 1 tablet by mouth 3 (three) times daily as needed for severe pain.    . pravastatin (PRAVACHOL) 40 MG tablet Take 40  mg by mouth daily.    . Tiotropium Bromide Monohydrate (SPIRIVA RESPIMAT) 2.5 MCG/ACT AERS Inhale 2 puffs once daily (Patient taking differently: Inhale 2 puffs into the lungs as needed. Inhale 2 puffs once daily) 1 Inhaler 11  . vitamin B-12 (CYANOCOBALAMIN) 1000 MCG tablet Take 1,000 mcg by mouth every morning.     . warfarin (COUMADIN) 5 MG tablet Take 1 tablet (5 mg total) by mouth daily. TAKE 7.5mg  (1.5 tablets) on day 1 (  tonight). Then 1 tablet daily after that. 30 tablet 0  . Polyethyl Glycol-Propyl Glycol (SYSTANE OP) Apply 1-2 drops to eye 4 (four) times daily as needed. For dry eyes     No current facility-administered medications for this visit.    Allergies:   Losartan and Chantix    Social History:  The patient  reports that she quit smoking about a year ago. Her smoking use included Cigarettes. She started smoking about 54 years ago. She has a 26.5 pack-year smoking history. She has never used smokeless tobacco. She reports that she drinks alcohol. She reports that she does not use illicit drugs.   Family History:  The patient's family history includes Anuerysm in her father; Cancer in her mother; Dementia in her father; Pneumonia in her father.    ROS:  Please see the history of present illness.   Otherwise, review of systems are positive for fatigue.   All other systems are reviewed and negative.    PHYSICAL EXAM: VS:  BP 130/64 mmHg  Pulse 85  Ht 5' (1.524 m)  Wt 161 lb 12.8 oz (73.392 kg)  BMI 31.60 kg/m2  SpO2 99% , BMI Body mass index is 31.6 kg/(m^2). GEN: Well nourished, well developed, in no acute distress HEENT: normal Neck: no JVD, carotid bruits, or masses Cardiac: irregularly irregular; no murmurs, rubs, or gallops,no edema  Respiratory:  clear to auscultation bilaterally, normal work of breathing GI: soft, nontender, nondistended, + BS MS: no deformity or atrophy Skin: warm and dry, no rash Neuro:  Strength and sensation are intact Psych: euthymic mood,  full affect   EKG:   The ekg ordered in 2/16 demonstrates  A. Fib with controlled ventricular response   Recent Labs: 10/28/2014: Pro B Natriuretic peptide (BNP) 12267.0* 01/10/2015: B Natriuretic Peptide 2273.8* 01/11/2015: Magnesium 2.3 01/13/2015: ALT 7 01/14/2015: TSH 1.235 01/25/2015: BUN 11; Creatinine 2.23*; Hemoglobin 9.1*; Platelets 128*; Potassium 4.3; Sodium 134*   Lipid Panel    Component Value Date/Time   CHOL 157 09/30/2014 0827   CHOL 164 04/05/2013   TRIG 166* 09/30/2014 0827   TRIG 39 04/05/2013   HDL 32* 09/30/2014 0827   CHOLHDL 4.9 09/30/2014 0827   VLDL 33 09/30/2014 0827   LDLCALC 92 09/30/2014 0827   LDLCALC 98 04/05/2013     Other studies Reviewed: Additional studies/ records that were reviewed today with results demonstrating: hospital records showing AFib and Coumadin starting.   ASSESSMENT AND PLAN:  1. CAD/Old MI: No angina.  Stable from a CAD standpoint 2. AFib:  No palpitations.  Controlled rate.  Tolerating Coumadin.  WIll have our Coumadin clinic manage the anticoagulation.  INR most likely checked at the dialysis in the [facility] area.  Upon further investigation, it appears that her INR has not been followed since leaving the hospital. She had already left our office by the time we discovered this. We will have her get INRs checked at dialysis and adjust her Coumadin levels based on that. There may be a delay in getting some of the results but it is exceedingly inconvenient for her to come in to our office along with dialysis for Coumadin checks. 3. HTN: Blood pressure controlled. 4. Obesity: She has lost a lot of weight since I have last seen her.  Initially, she was diagnosed with gastric outlet obstruction but she is not having any GI issues at this time.   Current medicines are reviewed at length with the patient today.  The patient concerns regarding her  medicines were addressed.  The following changes have been made:  No change  Labs/ tests  ordered today include:  No orders of the defined types were placed in this encounter.    Recommend 150 minutes/week of aerobic exercise Low fat, low carb, high fiber diet recommended  Disposition:   FU in 6 months.   Teresita Madura., MD  03/15/2015 11:46 AM    [facility] [place], [place], [place]  [address] Phone: [phone]; Fax: [phone]

## 2015-03-15 NOTE — Patient Instructions (Addendum)
Your physician recommends that you continue on your current medications as directed. Please refer to the Current Medication list given to you today.  Your physician wants you to follow-up in: 6 months with Dr. Varanasi.  You will receive a reminder letter in the mail two months in advance. If you don't receive a letter, please call our office to schedule the follow-up appointment.  

## 2015-03-15 NOTE — Telephone Encounter (Signed)
OT with [facility] Needs Rx for shower chair-must have arm rests and back rests [facility] care--- Phone number [phone] Fax number [phone]

## 2015-03-15 NOTE — Telephone Encounter (Signed)
Pt coumadin to be checked at dialysis and dosed by [address] coumadin clinic.  Below message sent to [facility]:  Pt taking 5 mg daily since 2/17 IP stay.  Pt goes to [facility] Tues/Thurs/Sat for dialysis.  Pt will have blood drawn tomorrow 4/7 during her visit and results will be faxed to Coumadin clinic for dosing.   Spoke with charge nurse Berdine Addison, Asked that they call our office with critical results, she agreed.

## 2015-03-17 ENCOUNTER — Ambulatory Visit (INDEPENDENT_AMBULATORY_CARE_PROVIDER_SITE_OTHER): Payer: Commercial Managed Care - HMO | Admitting: Cardiovascular Disease

## 2015-03-17 DIAGNOSIS — I482 Chronic atrial fibrillation, unspecified: Secondary | ICD-10-CM

## 2015-03-17 DIAGNOSIS — Z5181 Encounter for therapeutic drug level monitoring: Secondary | ICD-10-CM

## 2015-03-17 LAB — PROTIME-INR: INR: 1.2 — AB (ref 0.9–1.1)

## 2015-03-17 MED ORDER — WARFARIN SODIUM 5 MG PO TABS: ORAL_TABLET | ORAL | Status: DC

## 2015-03-17 NOTE — Patient Instructions (Signed)

## 2015-03-20 ENCOUNTER — Other Ambulatory Visit: Payer: Self-pay | Admitting: Family Medicine

## 2015-03-20 DIAGNOSIS — M549 Dorsalgia, unspecified: Principal | ICD-10-CM

## 2015-03-20 DIAGNOSIS — G8929 Other chronic pain: Secondary | ICD-10-CM

## 2015-03-20 NOTE — Telephone Encounter (Signed)
Sent prescription in.

## 2015-03-22 ENCOUNTER — Telehealth: Payer: Self-pay | Admitting: Family Medicine

## 2015-03-22 NOTE — Telephone Encounter (Signed)
Kristi [name] called and is requesting verbal orders to extend OT on the patient 1 visit a week for 3 weeks. He is also checking the status of the RX for shower chair [name] has not received this yet. jw Needs Rx for shower chair-must have arm rests and back rests [facility] care--- Phone number [phone] Fax number [phone]

## 2015-03-23 ENCOUNTER — Encounter (HOSPITAL_COMMUNITY): Payer: Self-pay | Admitting: *Deleted

## 2015-03-23 MED ORDER — DEXTROSE 5 % IV SOLN
1.5000 g | INTRAVENOUS | Status: AC
  Administered 2015-03-24: 1.5 g via INTRAVENOUS
  Filled 2015-03-23: qty 1.5

## 2015-03-23 MED ORDER — SODIUM CHLORIDE 0.9 % IV SOLN: INTRAVENOUS | Status: DC

## 2015-03-23 MED ORDER — CHLORHEXIDINE GLUCONATE CLOTH 2 % EX PADS: 6.0000 | MEDICATED_PAD | Freq: Once | CUTANEOUS | Status: DC

## 2015-03-24 ENCOUNTER — Ambulatory Visit (HOSPITAL_COMMUNITY): Payer: Commercial Managed Care - HMO | Admitting: Certified Registered Nurse Anesthetist

## 2015-03-24 ENCOUNTER — Encounter (HOSPITAL_COMMUNITY): Payer: Self-pay | Admitting: *Deleted

## 2015-03-24 ENCOUNTER — Encounter (HOSPITAL_COMMUNITY)
Admission: RE | Disposition: A | Discharge status: Home / Self Care | Payer: Self-pay | Source: Ambulatory Visit | Attending: Vascular Surgery

## 2015-03-24 ENCOUNTER — Other Ambulatory Visit: Payer: Commercial Managed Care - HMO | Admitting: *Deleted

## 2015-03-24 ENCOUNTER — Ambulatory Visit (HOSPITAL_COMMUNITY)
Admission: RE | Admit: 2015-03-24 | Discharge: 2015-03-24 | Disposition: A | Discharge status: Home / Self Care | Payer: Commercial Managed Care - HMO | Source: Ambulatory Visit | Attending: Vascular Surgery | Admitting: Vascular Surgery

## 2015-03-24 DIAGNOSIS — Y832 Surgical operation with anastomosis, bypass or graft as the cause of abnormal reaction of the patient, or of later complication, without mention of misadventure at the time of the procedure: Secondary | ICD-10-CM | POA: Insufficient documentation

## 2015-03-24 DIAGNOSIS — T82590A Other mechanical complication of surgically created arteriovenous fistula, initial encounter: Secondary | ICD-10-CM | POA: Diagnosis present

## 2015-03-24 DIAGNOSIS — I2510 Atherosclerotic heart disease of native coronary artery without angina pectoris: Secondary | ICD-10-CM | POA: Insufficient documentation

## 2015-03-24 DIAGNOSIS — I272 Other secondary pulmonary hypertension: Secondary | ICD-10-CM | POA: Insufficient documentation

## 2015-03-24 DIAGNOSIS — N186 End stage renal disease: Secondary | ICD-10-CM

## 2015-03-24 DIAGNOSIS — Y929 Unspecified place or not applicable: Secondary | ICD-10-CM | POA: Insufficient documentation

## 2015-03-24 DIAGNOSIS — I120 Hypertensive chronic kidney disease with stage 5 chronic kidney disease or end stage renal disease: Secondary | ICD-10-CM | POA: Diagnosis not present

## 2015-03-24 DIAGNOSIS — Z4931 Encounter for adequacy testing for hemodialysis: Secondary | ICD-10-CM

## 2015-03-24 DIAGNOSIS — Z7901 Long term (current) use of anticoagulants: Secondary | ICD-10-CM | POA: Diagnosis not present

## 2015-03-24 DIAGNOSIS — E785 Hyperlipidemia, unspecified: Secondary | ICD-10-CM | POA: Insufficient documentation

## 2015-03-24 DIAGNOSIS — Z7951 Long term (current) use of inhaled steroids: Secondary | ICD-10-CM | POA: Insufficient documentation

## 2015-03-24 DIAGNOSIS — N185 Chronic kidney disease, stage 5: Secondary | ICD-10-CM | POA: Diagnosis not present

## 2015-03-24 DIAGNOSIS — Z79891 Long term (current) use of opiate analgesic: Secondary | ICD-10-CM | POA: Insufficient documentation

## 2015-03-24 DIAGNOSIS — K219 Gastro-esophageal reflux disease without esophagitis: Secondary | ICD-10-CM | POA: Insufficient documentation

## 2015-03-24 DIAGNOSIS — Z794 Long term (current) use of insulin: Secondary | ICD-10-CM | POA: Diagnosis not present

## 2015-03-24 DIAGNOSIS — T82898A Other specified complication of vascular prosthetic devices, implants and grafts, initial encounter: Secondary | ICD-10-CM | POA: Diagnosis not present

## 2015-03-24 DIAGNOSIS — I252 Old myocardial infarction: Secondary | ICD-10-CM | POA: Diagnosis not present

## 2015-03-24 DIAGNOSIS — Z8673 Personal history of transient ischemic attack (TIA), and cerebral infarction without residual deficits: Secondary | ICD-10-CM | POA: Diagnosis not present

## 2015-03-24 DIAGNOSIS — M1990 Unspecified osteoarthritis, unspecified site: Secondary | ICD-10-CM | POA: Insufficient documentation

## 2015-03-24 DIAGNOSIS — E119 Type 2 diabetes mellitus without complications: Secondary | ICD-10-CM | POA: Insufficient documentation

## 2015-03-24 DIAGNOSIS — Z87891 Personal history of nicotine dependence: Secondary | ICD-10-CM | POA: Diagnosis not present

## 2015-03-24 DIAGNOSIS — I509 Heart failure, unspecified: Secondary | ICD-10-CM | POA: Diagnosis not present

## 2015-03-24 DIAGNOSIS — Z86718 Personal history of other venous thrombosis and embolism: Secondary | ICD-10-CM | POA: Insufficient documentation

## 2015-03-24 DIAGNOSIS — Z79899 Other long term (current) drug therapy: Secondary | ICD-10-CM | POA: Insufficient documentation

## 2015-03-24 DIAGNOSIS — E213 Hyperparathyroidism, unspecified: Secondary | ICD-10-CM | POA: Diagnosis not present

## 2015-03-24 DIAGNOSIS — J449 Chronic obstructive pulmonary disease, unspecified: Secondary | ICD-10-CM | POA: Diagnosis not present

## 2015-03-24 HISTORY — PX: LIGATION OF ARTERIOVENOUS  FISTULA: SHX5948

## 2015-03-24 HISTORY — DX: Xerosis cutis: L85.3

## 2015-03-24 HISTORY — PX: BASCILIC VEIN TRANSPOSITION: SHX5742

## 2015-03-24 HISTORY — DX: Reserved for inherently not codable concepts without codable children: IMO0001

## 2015-03-24 HISTORY — DX: Depression, unspecified: F32.A

## 2015-03-24 HISTORY — DX: Major depressive disorder, single episode, unspecified: F32.9

## 2015-03-24 LAB — GLUCOSE, CAPILLARY
GLUCOSE-CAPILLARY: 113 mg/dL — AB (ref 70–99)
Glucose-Capillary: 147 mg/dL — ABNORMAL HIGH (ref 70–99)

## 2015-03-24 LAB — POCT I-STAT 4, (NA,K, GLUC, HGB,HCT)
Glucose, Bld: 126 mg/dL — ABNORMAL HIGH (ref 70–99)
HCT: 38 % (ref 36.0–46.0)
HEMOGLOBIN: 12.9 g/dL (ref 12.0–15.0)
Potassium: 3.2 mmol/L — ABNORMAL LOW (ref 3.5–5.1)
Sodium: 137 mmol/L (ref 135–145)

## 2015-03-24 LAB — APTT: APTT: 27 s (ref 24–37)

## 2015-03-24 LAB — PROTIME-INR
INR: 1.02 (ref 0.00–1.49)
Prothrombin Time: 13.5 seconds (ref 11.6–15.2)

## 2015-03-24 SURGERY — TRANSPOSITION, VEIN, BASILIC
Anesthesia: General | Site: Arm Upper | Laterality: Left

## 2015-03-24 MED ORDER — LIDOCAINE HCL (CARDIAC) 20 MG/ML IV SOLN
INTRAVENOUS | Status: DC | PRN
  Administered 2015-03-24: 100 mg via INTRAVENOUS

## 2015-03-24 MED ORDER — PROPOFOL 10 MG/ML IV BOLUS
INTRAVENOUS | Status: DC | PRN
  Administered 2015-03-24: 150 mg via INTRAVENOUS

## 2015-03-24 MED ORDER — HEPARIN SODIUM (PORCINE) 5000 UNIT/ML IJ SOLN
INTRAMUSCULAR | Status: DC | PRN
  Administered 2015-03-24: 500 mL

## 2015-03-24 MED ORDER — OXYCODONE-ACETAMINOPHEN 5-325 MG PO TABS: 1.0000 | ORAL_TABLET | Freq: Every day | ORAL | Status: DC | PRN

## 2015-03-24 MED ORDER — HYDROMORPHONE HCL 1 MG/ML IJ SOLN
0.2500 mg | INTRAMUSCULAR | Status: DC | PRN
  Administered 2015-03-24 (×2): 0.5 mg via INTRAVENOUS

## 2015-03-24 MED ORDER — PHENYLEPHRINE HCL 10 MG/ML IJ SOLN
INTRAMUSCULAR | Status: DC | PRN
  Administered 2015-03-24 (×3): 80 ug via INTRAVENOUS

## 2015-03-24 MED ORDER — HYDROMORPHONE HCL 1 MG/ML IJ SOLN
INTRAMUSCULAR | Status: AC
  Administered 2015-03-24: 0.5 mg via INTRAVENOUS
  Filled 2015-03-24: qty 1

## 2015-03-24 MED ORDER — PHENYLEPHRINE HCL 10 MG/ML IJ SOLN
10.0000 mg | INTRAVENOUS | Status: DC | PRN
  Administered 2015-03-24: 20 ug/min via INTRAVENOUS

## 2015-03-24 MED ORDER — SODIUM CHLORIDE 0.9 % IV SOLN
INTRAVENOUS | Status: DC | PRN
  Administered 2015-03-24: 09:00:00 via INTRAVENOUS

## 2015-03-24 MED ORDER — SODIUM CHLORIDE 0.9 % IJ SOLN
INTRAMUSCULAR | Status: AC
  Filled 2015-03-24: qty 10

## 2015-03-24 MED ORDER — OXYCODONE-ACETAMINOPHEN 5-325 MG PO TABS
ORAL_TABLET | ORAL | Status: AC
  Administered 2015-03-24: 1
  Filled 2015-03-24: qty 1

## 2015-03-24 MED ORDER — LIDOCAINE HCL (PF) 1 % IJ SOLN
INTRAMUSCULAR | Status: AC
  Filled 2015-03-24: qty 30

## 2015-03-24 MED ORDER — 0.9 % SODIUM CHLORIDE (POUR BTL) OPTIME
TOPICAL | Status: DC | PRN
  Administered 2015-03-24: 1000 mL

## 2015-03-24 MED ORDER — FENTANYL CITRATE (PF) 250 MCG/5ML IJ SOLN
INTRAMUSCULAR | Status: AC
  Filled 2015-03-24: qty 5

## 2015-03-24 MED ORDER — FENTANYL CITRATE (PF) 100 MCG/2ML IJ SOLN
INTRAMUSCULAR | Status: DC | PRN
  Administered 2015-03-24: 150 ug via INTRAVENOUS

## 2015-03-24 MED ORDER — PROPOFOL 10 MG/ML IV BOLUS
INTRAVENOUS | Status: AC
  Filled 2015-03-24: qty 20

## 2015-03-24 MED ORDER — ONDANSETRON HCL 4 MG/2ML IJ SOLN
INTRAMUSCULAR | Status: DC | PRN
  Administered 2015-03-24: 4 mg via INTRAVENOUS

## 2015-03-24 MED ORDER — PROTAMINE SULFATE 10 MG/ML IV SOLN
INTRAVENOUS | Status: DC | PRN
  Administered 2015-03-24: 5 mg via INTRAVENOUS
  Administered 2015-03-24: 20 mg via INTRAVENOUS
  Administered 2015-03-24: 15 mg via INTRAVENOUS

## 2015-03-24 MED ORDER — PROTAMINE SULFATE 10 MG/ML IV SOLN
INTRAVENOUS | Status: AC
  Filled 2015-03-24: qty 5

## 2015-03-24 MED ORDER — HEPARIN SODIUM (PORCINE) 1000 UNIT/ML IJ SOLN
INTRAMUSCULAR | Status: AC
  Filled 2015-03-24: qty 1

## 2015-03-24 MED ORDER — ONDANSETRON HCL 4 MG/2ML IJ SOLN: 4.0000 mg | Freq: Once | INTRAMUSCULAR | Status: DC | PRN

## 2015-03-24 MED ORDER — EPHEDRINE SULFATE 50 MG/ML IJ SOLN
INTRAMUSCULAR | Status: AC
  Filled 2015-03-24: qty 1

## 2015-03-24 MED ORDER — EPHEDRINE SULFATE 50 MG/ML IJ SOLN
INTRAMUSCULAR | Status: DC | PRN
  Administered 2015-03-24: 10 mg via INTRAVENOUS
  Administered 2015-03-24: 15 mg via INTRAVENOUS
  Administered 2015-03-24: 10 mg via INTRAVENOUS

## 2015-03-24 MED ORDER — MIDAZOLAM HCL 2 MG/2ML IJ SOLN
INTRAMUSCULAR | Status: AC
  Filled 2015-03-24: qty 2

## 2015-03-24 MED ORDER — HEPARIN SODIUM (PORCINE) 1000 UNIT/ML IJ SOLN
INTRAMUSCULAR | Status: DC | PRN
  Administered 2015-03-24: 6000 [IU] via INTRAVENOUS

## 2015-03-24 MED ORDER — PHENYLEPHRINE 40 MCG/ML (10ML) SYRINGE FOR IV PUSH (FOR BLOOD PRESSURE SUPPORT)
PREFILLED_SYRINGE | INTRAVENOUS | Status: AC
  Filled 2015-03-24: qty 10

## 2015-03-24 MED ORDER — SODIUM CHLORIDE 0.9 % IV SOLN
INTRAVENOUS | Status: DC
  Administered 2015-03-24: 08:00:00 via INTRAVENOUS

## 2015-03-24 MED ORDER — MEPERIDINE HCL 25 MG/ML IJ SOLN: 6.2500 mg | INTRAMUSCULAR | Status: DC | PRN

## 2015-03-24 SURGICAL SUPPLY — 39 items
CANISTER SUCTION 2500CC (MISCELLANEOUS) ×4 IMPLANT
CANNULA VESSEL 3MM 2 BLNT TIP (CANNULA) ×4 IMPLANT
CLIP TI MEDIUM 24 (CLIP) ×4 IMPLANT
CLIP TI WIDE RED SMALL 24 (CLIP) ×4 IMPLANT
COVER PROBE W GEL 5X96 (DRAPES) IMPLANT
DECANTER SPIKE VIAL GLASS SM (MISCELLANEOUS) IMPLANT
DRAIN PENROSE 1/2X12 LTX STRL (WOUND CARE) IMPLANT
ELECT REM PT RETURN 9FT ADLT (ELECTROSURGICAL) ×4
ELECTRODE REM PT RTRN 9FT ADLT (ELECTROSURGICAL) ×2 IMPLANT
GLOVE BIO SURGEON STRL SZ 6.5 (GLOVE) ×3 IMPLANT
GLOVE BIO SURGEON STRL SZ7.5 (GLOVE) ×4 IMPLANT
GLOVE BIO SURGEONS STRL SZ 6.5 (GLOVE) ×1
GLOVE BIOGEL PI IND STRL 6.5 (GLOVE) ×4 IMPLANT
GLOVE BIOGEL PI IND STRL 7.0 (GLOVE) ×4 IMPLANT
GLOVE BIOGEL PI IND STRL 8 (GLOVE) ×2 IMPLANT
GLOVE BIOGEL PI INDICATOR 6.5 (GLOVE) ×4
GLOVE BIOGEL PI INDICATOR 7.0 (GLOVE) ×4
GLOVE BIOGEL PI INDICATOR 8 (GLOVE) ×2
GLOVE ECLIPSE 6.5 STRL STRAW (GLOVE) ×8 IMPLANT
GOWN STRL REUS W/ TWL LRG LVL3 (GOWN DISPOSABLE) ×6 IMPLANT
GOWN STRL REUS W/ TWL XL LVL3 (GOWN DISPOSABLE) ×2 IMPLANT
GOWN STRL REUS W/TWL LRG LVL3 (GOWN DISPOSABLE) ×6
GOWN STRL REUS W/TWL XL LVL3 (GOWN DISPOSABLE) ×2
KIT BASIN OR (CUSTOM PROCEDURE TRAY) ×4 IMPLANT
KIT ROOM TURNOVER OR (KITS) ×4 IMPLANT
LIQUID BAND (GAUZE/BANDAGES/DRESSINGS) ×12 IMPLANT
NS IRRIG 1000ML POUR BTL (IV SOLUTION) ×4 IMPLANT
PACK CV ACCESS (CUSTOM PROCEDURE TRAY) ×4 IMPLANT
PAD ARMBOARD 7.5X6 YLW CONV (MISCELLANEOUS) ×8 IMPLANT
SPONGE SURGIFOAM ABS GEL 100 (HEMOSTASIS) IMPLANT
SUT PROLENE 6 0 BV (SUTURE) ×4 IMPLANT
SUT SILK 2 0 SH (SUTURE) ×4 IMPLANT
SUT SILK 3 0 (SUTURE) ×2
SUT SILK 3-0 18XBRD TIE 12 (SUTURE) ×2 IMPLANT
SUT VIC AB 3-0 SH 27 (SUTURE) ×4
SUT VIC AB 3-0 SH 27X BRD (SUTURE) ×4 IMPLANT
SUT VICRYL 4-0 PS2 18IN ABS (SUTURE) ×8 IMPLANT
UNDERPAD 30X30 INCONTINENT (UNDERPADS AND DIAPERS) ×4 IMPLANT
WATER STERILE IRR 1000ML POUR (IV SOLUTION) ×4 IMPLANT

## 2015-03-24 NOTE — Progress Notes (Signed)
Care of pt assumed by MA Kayshaun Polanco RN 

## 2015-03-24 NOTE — H&P (Signed)
Vascular and Vein Specialist of [facility]  Patient name: Kristi [name]: [id number] DOB: [birth year]Sex: female  EASON FOR VISIT: Poorly functioning left radiocephalic AV fistula  HPI:  Kristi [name] is a 75 y.o. female who had a left radiocephalic fistula placed in April 2012. I saw her last in September 2013 which time it was working well. She did not begin dialysis until just recently and may have had some problems cannulating her fistula. This reason, recently she had a tunneled dialysis catheter placed which she uses for dialysis on Tuesdays Thursdays and Saturdays. He denies any recent uremic symptoms. Separately she denies nausea, vomiting, fatigue, anorexia, or palpitations.  She denies any pain associated with her fistula in the left forearm.  Past Medical History  Diagnosis Date  . Hyperlipidemia   . Hyperparathyroidism   . CAD (coronary artery disease)   . Myocardial infarction 2008  . Hypertension   . COPD (chronic obstructive pulmonary disease)   . Diabetes mellitus     diagnosed 2010 - takes oral meds and lantus insulin  . Anemia   . Chronic kidney disease     esrd - since 2010  . GERD (gastroesophageal reflux disease)     on omeprazole  . Arthritis     all over  . CHF (congestive heart failure) 08-2011  . CVA 02/19/2010  . CAD 02/19/2010  . DEEP VENOUS THROMBOPHLEBITIS, LEG, LEFT 03/08/2010    in 2009 s/p hospitalization- only 1 isolated dvt  . Pulmonary artery hypertension 10/31/2014   Family History  Problem Relation Age of Onset  . Cancer Mother     multiple myloma  . Dementia Father   . Pneumonia Father   . Anuerysm Father    SOCIAL HISTORY: History  Substance Use Topics  . Smoking status: Former Smoker -- 0.50 packs/day for 53 years    Types: Cigarettes    Start date: 12/09/1960    Quit date: 03/31/2014  .  Smokeless tobacco: Never Used  . Alcohol Use: 0.0 oz/week    0 Standard drinks or equivalent per week   Allergies  Allergen Reactions  . Losartan Other (See Comments)    Causes increase in creatinine and worsening CKD  . Chantix [Varenicline] Nausea Only and Other (See Comments)    Dizzy and GI symptoms    Current Outpatient Prescriptions  Medication Sig Dispense Refill  . acetaminophen (TYLENOL) 500 MG tablet Take 1,000 mg by mouth every 6 (six) hours as needed.    Marland Kitchen acetaminophen-codeine (TYLENOL #3) 300-30 MG per tablet 1 tablet every 6 (six) hours as needed for moderate pain.     Marland Kitchen albuterol (PROVENTIL HFA) 108 (90 BASE) MCG/ACT inhaler Inhale 2 puffs into the lungs every 4 (four) hours as needed. For wheezing 8.5 g 2  . allopurinol (ZYLOPRIM) 300 MG tablet Take 300 mg by mouth daily.  5  . amLODipine (NORVASC) 5 MG tablet Take 1 tablet (5 mg total) by mouth daily. 90 tablet 3  . atorvastatin (LIPITOR) 40 MG tablet Take 1 tablet (40 mg total) by mouth daily. 30 tablet 0  . [facility] ALLERGY RELIEF 180 MG tablet TAKE 1 TABLET BY MOUTH EVERY DAY (Patient taking differently: TAKE 1 TABLET BY MOUTH as needed for allergies) 90 tablet 3  . furosemide (LASIX) 80 MG tablet Take 1 tablet (80 mg total) by mouth daily. (Patient taking differently: Take 80-160 mg by mouth 2 (two) times daily. Takes 160mg  in am and 80 or 160mg depending in pm) 30 tablet 0  .  glipiZIDE (GLUCOTROL XL) 5 MG 24 hr tablet Take 1 tablet (5 mg total) by mouth daily with breakfast. (Patient taking differently: Take 5 mg by mouth every evening. ) 30 tablet 11  . glucosamine-chondroitin 500-400 MG tablet Take 1 tablet by mouth 2 (two) times daily.     Marland Kitchen glucose blood (ACCU-CHEK SMARTVIEW) test strip 1 each by Other route 2 (two) times daily. Use as instructed 100 each 12  . HYDROcodone-acetaminophen (NORCO/VICODIN) 5-325 MG per tablet Take 1 tablet  by mouth every 12 (twelve) hours as needed for moderate pain. 60 tablet 0  . insulin glargine (LANTUS) 100 UNIT/ML injection Inject 0.13 mLs (13 Units total) into the skin every morning. If blood sugar is >140 add 1 unit. If blood sugar is <140 subtract 1 unit. 10 mL 2  . Lancets (ACCU-CHEK SOFT TOUCH) lancets 1 each by Other route 2 (two) times daily. Use as instructed 100 each 12  . metoprolol succinate (TOPROL-XL) 25 MG 24 hr tablet Take 0.5 tablets (12.5 mg total) by mouth daily. 90 tablet 3  . mometasone-formoterol (DULERA) 100-5 MCG/ACT AERO Inhale 2 puffs into the lungs 2 (two) times daily. (Patient taking differently: Inhale 2 puffs into the lungs as needed for shortness of breath. ) 13 g 1  . nitroGLYCERIN (NITROSTAT) 0.4 MG SL tablet Place 0.4 mg under the tongue every 5 (five) minutes as needed. For chest pain Call 911 if pain not relieved by 3rd tab    . NON FORMULARY Place 2 L into the nose continuous. 2 liters of o2    . Omega-3 Fatty Acids (FISH OIL) 1200 MG CAPS Take 2 capsules by mouth 2 (two) times daily.    Marland Kitchen omeprazole (PRILOSEC) 20 MG capsule TAKE ONE CAPSULE BY MOUTH AT BEDTIME 90 capsule 3  . oxyCODONE-acetaminophen (PERCOCET/ROXICET) 5-325 MG per tablet Take 1 tablet by mouth 3 (three) times daily as needed for severe pain.    Vladimir Faster Glycol-Propyl Glycol (SYSTANE OP) Apply 1-2 drops to eye 4 (four) times daily as needed. For dry eyes    . Tiotropium Bromide Monohydrate (SPIRIVA RESPIMAT) 2.5 MCG/ACT AERS Inhale 2 puffs once daily (Patient taking differently: Inhale 2 puffs into the lungs as needed. Inhale 2 puffs once daily) 1 Inhaler 11  . vitamin B-12 (CYANOCOBALAMIN) 1000 MCG tablet Take 1,000 mcg by mouth every morning.     . warfarin (COUMADIN) 5 MG tablet Take 1 tablet (5 mg total) by mouth daily. TAKE 7.5mg  (1.5 tablets) on day 1 (tonight). Then 1 tablet daily after that. 30 tablet 0  .  allopurinol (ZYLOPRIM) 100 MG tablet TAKE 2 TABLETS BY MOUTH EVERY DAY (Patient not taking: Reported on 02/17/2015) 60 tablet 3   No current facility-administered medications for this visit.   REVIEW OF SYSTEMS: Valu.Nieves ] denotes positive finding; [ ]  denotes negative finding  CARDIOVASCULAR: [ ]  chest pain [ ]  chest pressure [ ]  palpitations [ ]  orthopnea  [ ]  dyspnea on exertion [ ]  claudication [ ]  rest pain [ ]  DVT [ ]  phlebitis PULMONARY: [ ]  productive cough [ ]  asthma [ ]  wheezing NEUROLOGIC: Valu.Nieves ] weakness [ ]  paresthesias [ ]  aphasia [ ]  amaurosis [ ]  dizziness HEMATOLOGIC: [ ]  bleeding problems [ ]  clotting disorders MUSCULOSKELETAL: [ ]  joint pain [ ]  joint swelling [ ]  leg swelling GASTROINTESTINAL: [ ]  blood in stool [ ]  hematemesis GENITOURINARY: [ ]  dysuria [ ]  hematuria PSYCHIATRIC: [ ]  history of major depression INTEGUMENTARY: [ ]  rashes [ ]   ulcers CONSTITUTIONAL: [ ]  fever [ ]  chills  PHYSICAL EXAM: Filed Vitals:   02/17/15 0957  BP: 112/50  Pulse: 65  Height: 5' (1.524 m)  Weight: 166 lb 3.2 oz (75.388 kg)  SpO2: 97%   Body mass index is 32.46 kg/(m^2). GENERAL: The patient is a well-nourished female, in no acute distress. The vital signs are documented above. CARDIOVASCULAR: There is a regular rate and rhythm. Do not detect carotid bruits. She has palpable radial pulses. She has a good thrill in her proximal left radiocephalic fistula although the vein is more difficult to follow distally. The vein is tortuous and fairly mobile. PULMONARY: There is good air exchange bilaterally without wheezing or rales. ABDOMEN: Soft and non-tender with normal pitched bowel sounds.  MUSCULOSKELETAL: There are no major deformities or cyanosis. NEUROLOGIC: No focal weakness or paresthesias are detected. SKIN: There are no ulcers or rashes noted. PSYCHIATRIC: The patient has a normal affect.  DATA:  I  have an acutely interpreted her duplex of her fistula which shows the diameters range from 0.64-0.70 cm. The forearm cephalic vein on the left in these into the basilic system. The upper arm cephalic vein is pretty small on the left. I've also reviewed her vein map on the right upper extremity which shows that her forearm and upper arm cephalic vein on the right is fairly small. Basilic vein on the right looks reasonable in size.  MEDICAL ISSUES:  POORLY FUNCTIONING LEFT RADIOCEPHALIC AV FISTULA: The fistula is very tortuous and I think it will be difficult to cannulate. Therefore I would recommend placement of new access. Based on her vein mapping, it appears that the best option would be a left basilic vein transposition. I have explained the indications for placement of an AV fistula or AV graft. I've explained that if at all possible we will place an AV fistula. I have reviewed the risks of placement of an AV fistula including but not limited to: failure of the fistula to mature, need for subsequent interventions, and thrombosis. In addition I have reviewed the potential complications of placement of an AV graft. These risks include, but are not limited to, graft thrombosis, graft infection, wound healing problems, bleeding, arm swelling, and steal syndrome. All the patient's questions were answered and they are agreeable to proceed with surgery. This has been scheduled for 03/03/2015.   [place] [facility] [place]: [phone]

## 2015-03-24 NOTE — Progress Notes (Signed)
Dialysis access record faxed to [facility]

## 2015-03-24 NOTE — Progress Notes (Signed)
Dr Conrad Crest here to see pt. OK to dc to home and pt will use home O2.

## 2015-03-24 NOTE — Op Note (Signed)
    NAME: Kristi [name]   MRN: [id number] DOB: Nov 15, 1941    DATE OF OPERATION: 03/24/2015  PREOP DIAGNOSIS: stage V chronic kidney disease  POSTOP DIAGNOSIS: same  PROCEDURE:  1. Left basilic vein transposition.  2. Ligation of left radial cephalic AV fistula.  SURGEON: Judeth Cornfield. Scot Dock, MD, FACS  ASSIST: Silva Bandy, [facility]  ANESTHESIA: Gen.   EBL: minimal  INDICATIONS: [name] is a 74 y.o. female who has a left radiocephalic fistula that was not usable. She presents for new access.  FINDINGS: 5 mm basilic vein  TECHNIQUE: The patient was taken to the operating room and received a general anesthetic. The left upper extremity was prepped and draped in usual sterile fashion. Using 2 incisions along the medial aspect of the left upper arm the basilic vein was harvested from the antecubital level to the axilla. Veins were divided between clips and 3-0 silk ties. Through the distal incision, the brachial artery was dissected free beneath the fascia. The vein was distended and marked to prevent twisting and then a curved tunnel created between the 2 incisions. The vein was brought to the tunnel and the patient was heparinized. The brachial artery was clamped proximal and distally and longitudinal arteriotomy was made. The vein was sewn end-to-side to the artery using continuous 6-0 Prolene suture. At the completion was an excellent thrill in the fistula and a palpable radial pulse. There was a fistula which was nonfunctional the wrist and a small incision over this area where the fistula was ligated with a 2-0 silk tie. This wound was closed with a 40 subcutaneous stitch. Hemostasis was obtained in the wounds in each of the wounds was closed with 2 deep layers of 3-0 Vicryl and the skin closed with 4-0 Vicryl. Dermabond was applied. The patient tolerated the procedure well and was transferred to the recovery room in stable condition. All needle and sponge counts were  correct.  Deitra Mayo, MD, FACS Vascular and Vein Specialists of [facility]  DATE OF DICTATION:   03/24/2015

## 2015-03-24 NOTE — Anesthesia Preprocedure Evaluation (Addendum)
Anesthesia Evaluation  Patient identified by MRN, date of birth, ID band  Reviewed: Allergy & Precautions, NPO status , Patient's Chart, lab work & pertinent test results  Airway Mallampati: I  TM Distance: >3 FB Neck ROM: Full    Dental  (+) Teeth Intact, Dental Advisory Given   Pulmonary COPDformer smoker,    Pulmonary exam normal       Cardiovascular hypertension, Pt. on medications + CAD, + Past MI and + Cardiac Stents     Neuro/Psych    GI/Hepatic GERD-  Medicated and Controlled,  Endo/Other  diabetes, Type 2, Insulin Dependent  Renal/GU CRFRenal disease     Musculoskeletal   Abdominal   Peds  Hematology   Anesthesia Other Findings   Reproductive/Obstetrics                           Anesthesia Physical Anesthesia Plan  ASA: III  Anesthesia Plan: General   Post-op Pain Management:    Induction: Intravenous  Airway Management Planned: LMA  Additional Equipment:   Intra-op Plan:   Post-operative Plan: Extubation in OR  Informed Consent: I have reviewed the patients History and Physical, chart, labs and discussed the procedure including the risks, benefits and alternatives for the proposed anesthesia with the patient or authorized representative who has indicated his/her understanding and acceptance.     Plan Discussed with: CRNA and Surgeon  Anesthesia Plan Comments:         Anesthesia Quick Evaluation

## 2015-03-24 NOTE — Anesthesia Procedure Notes (Signed)
Procedure Name: LMA Insertion Date/Time: 03/24/2015 9:32 AM Performed by: Trixie Deis A Pre-anesthesia Checklist: Patient identified, Emergency Drugs available, Patient being monitored, Suction available and Timeout performed Patient Re-evaluated:Patient Re-evaluated prior to inductionOxygen Delivery Method: Circle system utilized Preoxygenation: Pre-oxygenation with 100% oxygen Intubation Type: IV induction Ventilation: Mask ventilation without difficulty LMA: LMA inserted LMA Size: 4.0 Number of attempts: 1 Placement Confirmation: positive ETCO2 and breath sounds checked- equal and bilateral Tube secured with: Tape Dental Injury: Teeth and Oropharynx as per pre-operative assessment

## 2015-03-24 NOTE — Progress Notes (Signed)
When pt falls asleep, O2sat 86-88%. When awakened, comes up quickly to 94-96%, strong cough. Says she wears O2 at home on a daily basis for several hours. Dr Conrad Panguitch updated---said he will be over to see pt.

## 2015-03-24 NOTE — Transfer of Care (Signed)
Immediate Anesthesia Transfer of Care Note  Patient: Kristi [name]  Procedure(s) Performed: Procedure(s): LEFT ARM BASILIC VEIN TRANSPOSITION (Left) LIGATION OF left arm radio-cephalic ARTERIOVENOUS  FISTULA (Left)  Patient Location: PACU  Anesthesia Type:General  Level of Consciousness: awake, alert , oriented and patient cooperative  Airway & Oxygen Therapy: Patient Spontanous Breathing and Patient connected to nasal cannula oxygen  Post-op Assessment: Report given to RN, Post -op Vital signs reviewed and stable and Patient moving all extremities  Post vital signs: Reviewed and stable  Last Vitals:  Filed Vitals:   03/24/15 0725  BP: 128/69  Pulse: 77  Temp: 36.6 C  Resp: 18    Complications: No apparent anesthesia complications

## 2015-03-24 NOTE — Progress Notes (Signed)
Report given to teresa rn as caregiver 

## 2015-03-24 NOTE — Anesthesia Postprocedure Evaluation (Signed)
Anesthesia Post Note  Patient: Kristi [name]  Procedure(s) Performed: Procedure(s) (LRB): LEFT ARM BASILIC VEIN TRANSPOSITION (Left) LIGATION OF left arm radio-cephalic ARTERIOVENOUS  FISTULA (Left)  Anesthesia type: general  Patient location: PACU  Post pain: Pain level controlled  Post assessment: Patient's Cardiovascular Status Stable  Last Vitals:  Filed Vitals:   03/24/15 1357  BP: 106/49  Pulse: 66  Temp: 36.7 C  Resp:     Post vital signs: Reviewed and stable  Level of consciousness: awake  Complications: No apparent anesthesia complications

## 2015-03-24 NOTE — Telephone Encounter (Signed)
Will forward to PCP. Also DME order for shower chair placed on 4/11 was never received, could you please write rx for this on a script and give to me so I can personally fax to [facility]. Thanks!

## 2015-03-24 NOTE — Progress Notes (Signed)
Dr Conrad Pinon here to see pt ok for pt to go home w/o portable o2

## 2015-03-27 ENCOUNTER — Encounter (HOSPITAL_COMMUNITY): Payer: Self-pay | Admitting: Vascular Surgery

## 2015-03-28 ENCOUNTER — Telehealth: Payer: Self-pay | Admitting: Vascular Surgery

## 2015-03-28 NOTE — Telephone Encounter (Signed)
Left message for patient, dpm

## 2015-03-28 NOTE — Telephone Encounter (Signed)
-----   Message from Mena Goes, RN sent at 03/24/2015  3:23 PM EDT ----- Regarding: Schedule   ----- Message -----    From: Alvia Grove, PA-C    Sent: 03/24/2015  11:30 AM      To: Vvs Charge Pool  S/p left basilic vein transposition and ligation of left radial-cephalic AVF 02/17/20  F/u with Dr. Scot Dock in 6 weeks with duplex.   Thanks Maudie Mercury

## 2015-03-30 LAB — PROTIME-INR: INR: 1.5 — AB (ref 0.9–1.1)

## 2015-04-03 ENCOUNTER — Ambulatory Visit (INDEPENDENT_AMBULATORY_CARE_PROVIDER_SITE_OTHER): Payer: Commercial Managed Care - HMO | Admitting: Internal Medicine

## 2015-04-03 DIAGNOSIS — I482 Chronic atrial fibrillation, unspecified: Secondary | ICD-10-CM

## 2015-04-03 DIAGNOSIS — Z5181 Encounter for therapeutic drug level monitoring: Secondary | ICD-10-CM

## 2015-04-04 LAB — PROTIME-INR: INR: 1.8 — AB (ref 0.9–1.1)

## 2015-04-04 NOTE — Telephone Encounter (Signed)
I called "[facility]" and left a voicemail giving the verbal orders. Also, faxed a signed prescription into [facility] using the fax number provided.

## 2015-04-06 ENCOUNTER — Ambulatory Visit (INDEPENDENT_AMBULATORY_CARE_PROVIDER_SITE_OTHER): Payer: Commercial Managed Care - HMO | Admitting: Cardiology

## 2015-04-06 DIAGNOSIS — I482 Chronic atrial fibrillation, unspecified: Secondary | ICD-10-CM

## 2015-04-06 DIAGNOSIS — Z5181 Encounter for therapeutic drug level monitoring: Secondary | ICD-10-CM

## 2015-04-13 LAB — PROTIME-INR: INR: 2.1 — AB (ref 0.9–1.1)

## 2015-04-14 ENCOUNTER — Ambulatory Visit (INDEPENDENT_AMBULATORY_CARE_PROVIDER_SITE_OTHER): Payer: Commercial Managed Care - HMO | Admitting: Pharmacist

## 2015-04-14 DIAGNOSIS — I482 Chronic atrial fibrillation, unspecified: Secondary | ICD-10-CM

## 2015-04-14 DIAGNOSIS — Z5181 Encounter for therapeutic drug level monitoring: Secondary | ICD-10-CM

## 2015-04-26 ENCOUNTER — Telehealth: Payer: Self-pay | Admitting: Family Medicine

## 2015-04-26 NOTE — Telephone Encounter (Signed)
Mrs. Huizenga is calling because she needs an authorization to continue to see Dr. Kathlee Nations, a Vein and Vascular Specialist located at [address], [place], [place] [address]; Phone: [phone]. Please contact the patient once it is ready. Thank you, Fonda Kinder, ASA

## 2015-04-26 NOTE — Telephone Encounter (Signed)
[employer] auth obtained #[id number]

## 2015-04-27 LAB — PROTIME-INR: INR: 1.5 — AB (ref <=1.1)

## 2015-05-01 ENCOUNTER — Ambulatory Visit (INDEPENDENT_AMBULATORY_CARE_PROVIDER_SITE_OTHER): Payer: Commercial Managed Care - HMO | Admitting: Interventional Cardiology

## 2015-05-01 DIAGNOSIS — Z5181 Encounter for therapeutic drug level monitoring: Secondary | ICD-10-CM

## 2015-05-01 DIAGNOSIS — I482 Chronic atrial fibrillation, unspecified: Secondary | ICD-10-CM

## 2015-05-05 ENCOUNTER — Encounter: Payer: Self-pay | Admitting: Vascular Surgery

## 2015-05-10 ENCOUNTER — Encounter: Payer: Self-pay | Admitting: Vascular Surgery

## 2015-05-10 ENCOUNTER — Ambulatory Visit (INDEPENDENT_AMBULATORY_CARE_PROVIDER_SITE_OTHER): Payer: Self-pay | Admitting: Vascular Surgery

## 2015-05-10 ENCOUNTER — Ambulatory Visit (HOSPITAL_COMMUNITY)
Admission: RE | Admit: 2015-05-10 | Discharge: 2015-05-10 | Disposition: A | Discharge status: Home / Self Care | Payer: Commercial Managed Care - HMO | Source: Ambulatory Visit | Attending: Vascular Surgery | Admitting: Vascular Surgery

## 2015-05-10 VITALS — BP 133/73 | HR 86 | Temp 98.2°F | Resp 20 | Ht 60.0 in | Wt 160.0 lb

## 2015-05-10 DIAGNOSIS — Z4931 Encounter for adequacy testing for hemodialysis: Secondary | ICD-10-CM

## 2015-05-10 DIAGNOSIS — N186 End stage renal disease: Secondary | ICD-10-CM | POA: Diagnosis not present

## 2015-05-10 NOTE — Progress Notes (Signed)
Patient name: Kristi [name] MRN: [id number] DOB: 10-Oct-1941 Sex: female  REASON FOR VISIT: Follow up  HPI: Kristi [name] is a 74 y.o. female who underwent a left basilic vein transposition and ligation of a left radiocephalic fistula on 23/55/7322. She comes in for a 6 week follow up visit. She has no specific complaints. She denies pain or paresthesias in the left arm. She currently uses her catheter for dialysis.  Current Outpatient Prescriptions  Medication Sig Dispense Refill  . acetaminophen (TYLENOL) 500 MG tablet Take 1,000 mg by mouth every 6 (six) hours as needed.    Marland Kitchen acetaminophen-codeine (TYLENOL #3) 300-30 MG per tablet 1 tablet every 6 (six) hours as needed (for mild pain).     Marland Kitchen albuterol (PROVENTIL HFA;VENTOLIN HFA) 108 (90 BASE) MCG/ACT inhaler Inhale into the lungs every 4 (four) hours as needed for wheezing.    Marland Kitchen allopurinol (ZYLOPRIM) 300 MG tablet Take 300 mg by mouth daily.  5  . amLODipine (NORVASC) 5 MG tablet Take 1 tablet (5 mg total) by mouth daily. 90 tablet 3  . calcitRIOL (ROCALTROL) 0.25 MCG capsule Take 0.25 mcg by mouth 3 (three) times a week. Monday, Wednesday, Friday  5  . CINNAMON PO Take 1,000 mg by mouth daily.    . ferrous sulfate 325 (65 FE) MG tablet Take 325 mg by mouth daily.    . fexofenadine (ALLEGRA) 180 MG tablet Take 180 mg by mouth daily as needed for allergies or rhinitis.    . furosemide (LASIX) 80 MG tablet Take 80 mg by mouth 3 (three) times daily as needed for fluid.    Marland Kitchen glipiZIDE (GLUCOTROL XL) 5 MG 24 hr tablet Take 5 mg by mouth every evening.    Marland Kitchen glucosamine-chondroitin 500-400 MG tablet Take 1 tablet by mouth 2 (two) times daily.      Marland Kitchen glucose blood (ACCU-CHEK SMARTVIEW) test strip 1 each by Other route 2 (two) times daily. Use as instructed 100 each 12  . HYDROcodone-acetaminophen (NORCO/VICODIN) 5-325 MG per tablet Take 1 tablet by mouth daily as needed for moderate pain.     Marland Kitchen insulin glargine (LANTUS) 100 UNIT/ML injection  Inject 0.13 mLs (13 Units total) into the skin every morning. If blood sugar is >140 add 1 unit. If blood sugar is <140 subtract 1 unit. 10 mL 2  . Lancets (ACCU-CHEK SOFT TOUCH) lancets 1 each by Other route 2 (two) times daily. Use as instructed 100 each 12  . metoprolol succinate (TOPROL-XL) 25 MG 24 hr tablet Take 0.5 tablets (12.5 mg total) by mouth daily. 90 tablet 3  . mometasone-formoterol (DULERA) 100-5 MCG/ACT AERO Inhale 2 puffs into the lungs 2 (two) times daily as needed for shortness of breath.    . nitroGLYCERIN (NITROSTAT) 0.4 MG SL tablet Place 0.4 mg under the tongue every 5 (five) minutes as needed. For chest pain     Call 911 if pain not relieved by 3rd tab    . Omega-3 Fatty Acids (FISH OIL) 1200 MG CAPS Take 2 capsules by mouth 2 (two) times daily.    Marland Kitchen omeprazole (PRILOSEC) 20 MG capsule TAKE ONE CAPSULE BY MOUTH AT BEDTIME 90 capsule 3  . oxyCODONE-acetaminophen (PERCOCET/ROXICET) 5-325 MG per tablet Take 1 tablet by mouth daily as needed for severe pain. 30 tablet 0  . Polyethyl Glycol-Propyl Glycol (SYSTANE OP) Apply 1-2 drops to eye 4 (four) times daily as needed. For dry eyes    . pravastatin (PRAVACHOL) 40 MG tablet Take  40 mg by mouth daily.    . Tiotropium Bromide Monohydrate 2.5 MCG/ACT AERS Inhale 2 puffs into the lungs daily as needed (wheezing).    . vitamin B-12 (CYANOCOBALAMIN) 1000 MCG tablet Take 1,000 mcg by mouth every morning.     . warfarin (COUMADIN) 5 MG tablet Take 5 mg by mouth daily.     No current facility-administered medications for this visit.   REVIEW OF SYSTEMS: Valu.Nieves ] denotes positive finding; [  ] denotes negative finding  CARDIOVASCULAR:  [ ]  chest pain   [ ]  dyspnea on exertion    CONSTITUTIONAL:  [ ]  fever   [ ]  chills  PHYSICAL EXAM: Filed Vitals:   05/10/15 1523  BP: 133/73  Pulse: 86  Temp: 98.2 F (36.8 C)  TempSrc: Oral  Resp: 20  Height: 5' (1.524 m)  Weight: 160 lb (72.576 kg)  SpO2: 95%   GENERAL: The patient is a  well-nourished female, in no acute distress. The vital signs are documented above. CARDIOVASCULAR: There is a regular rate and rhythm. PULMONARY: There is good air exchange bilaterally without wheezing or rales. Her left upper arm fistula has an excellent bruit and thrill.  I have independently interpreted her duplex of her fistula today which shows that the diameters of her fistula ranged from 0.69-0.98 cm.  MEDICAL ISSUES:  STAGE V CHRONIC KIDNEY DISEASE: Her basilic vein transposition appears to be maturing adequately. It should be ready for dialysis on 06/23/2015. Until then she will use her catheter.  I will see her back as needed.  Deitra Mayo [facility] [place]: [phone]

## 2015-05-11 LAB — PROTIME-INR: INR: 1.6 — AB (ref 0.9–1.1)

## 2015-05-12 ENCOUNTER — Other Ambulatory Visit: Payer: Self-pay | Admitting: Interventional Cardiology

## 2015-05-12 ENCOUNTER — Ambulatory Visit (INDEPENDENT_AMBULATORY_CARE_PROVIDER_SITE_OTHER): Payer: Commercial Managed Care - HMO | Admitting: Cardiology

## 2015-05-12 DIAGNOSIS — I482 Chronic atrial fibrillation, unspecified: Secondary | ICD-10-CM

## 2015-05-12 DIAGNOSIS — Z5181 Encounter for therapeutic drug level monitoring: Secondary | ICD-10-CM

## 2015-05-24 ENCOUNTER — Ambulatory Visit (INDEPENDENT_AMBULATORY_CARE_PROVIDER_SITE_OTHER): Payer: Commercial Managed Care - HMO | Admitting: Family Medicine

## 2015-05-24 VITALS — BP 114/51 | HR 75 | Temp 97.9°F | Ht 60.0 in | Wt 163.0 lb

## 2015-05-24 DIAGNOSIS — N186 End stage renal disease: Secondary | ICD-10-CM | POA: Diagnosis not present

## 2015-05-24 DIAGNOSIS — Z794 Long term (current) use of insulin: Secondary | ICD-10-CM

## 2015-05-24 DIAGNOSIS — E119 Type 2 diabetes mellitus without complications: Secondary | ICD-10-CM | POA: Diagnosis not present

## 2015-05-24 DIAGNOSIS — E11311 Type 2 diabetes mellitus with unspecified diabetic retinopathy with macular edema: Secondary | ICD-10-CM

## 2015-05-24 DIAGNOSIS — Z992 Dependence on renal dialysis: Secondary | ICD-10-CM

## 2015-05-24 LAB — POCT GLYCOSYLATED HEMOGLOBIN (HGB A1C): Hemoglobin A1C: 6.1

## 2015-05-24 MED ORDER — ACCU-CHEK AVIVA DEVI: Status: AC

## 2015-05-24 MED ORDER — ACCU-CHEK SOFTCLIX LANCET DEV MISC: Status: DC

## 2015-05-24 MED ORDER — INSULIN GLARGINE 100 UNIT/ML ~~LOC~~ SOLN: 13.0000 [IU] | SUBCUTANEOUS | Status: DC

## 2015-05-24 MED ORDER — GLUCOSE BLOOD VI STRP: ORAL_STRIP | Status: DC

## 2015-05-24 MED ORDER — ACCU-CHEK SOFTCLIX LANCET DEV KIT: 1.0000 | PACK | Freq: Three times a day (TID) | Status: DC

## 2015-05-24 NOTE — Assessment & Plan Note (Signed)
Ordered new DM equipment due to patient being on [employer].  - Lantus refilled

## 2015-05-24 NOTE — Assessment & Plan Note (Signed)
Tolerating HD well. Encouraged her to stay active. There is no need to "push it" if she feels dizzy, lightheaded, or weak--but if she is concerned about the naps required after HD then trying to sleep during HD or keeping herself moving after may help w/ that fatigue. The fatigue may improve w/ time.

## 2015-05-24 NOTE — Patient Instructions (Signed)
It was a pleasure seeing you today in our clinic. Today we discussed your hemodialysis and diabetes. Here is the treatment plan we have discussed and agreed upon together:   - Continue taking your medications as prescribed. - Continue to stay as active as possible even on days you have dialysis! But don't push yourself too hard. Your body is still getting use to this dialysis process. - I have changed over your diabetes kits and supplies. If you have any issues please call our office!

## 2015-05-24 NOTE — Progress Notes (Signed)
   HPI  CC: diabetes medication  Patient has been doing well. Feeling things are going "really well" with her hemodialysis. She is on a T/Th/Sat schedule. Reports feeling really "worn out" after each session. Has to take a 2-3hr nap after each session. Is unable to sleep during HD. Is no longer requiring constant O2 supplementation. Says that she does occasionally require it at home but it is needed less often.  Asking to go over DM supplies. She received a letter from [facility] stating she needed to switch DM supply brands.  ROS: endorsed new area of numbness on Lt arm after surgery. Denies fever, chills, HA, vision change, dysphagia, ST, CP, n/v/d  Review of Systems   See HPI for ROS. All other systems reviewed and are negative.  Past medical history and social history reviewed and updated in the EMR as appropriate.  Objective: BP 114/51 mmHg  Pulse 75  Temp(Src) 97.9 F (36.6 C) (Oral)  Ht 5' (1.524 m)  Wt 163 lb (73.936 kg)  BMI 31.83 kg/m2 Gen: NAD, alert, cooperative, and pleasant. HEENT: NCAT, EOMI, PERRL CV: RRR, no murmur. Bruit present on Lt arm Resp: CTAB, non-labored Abd: SNTND, BS present, no guarding or organomegaly Ext: No edema, warm Neuro: Alert and oriented, Speech clear, No gross deficits  Assessment and plan:  ESRD needing dialysis Tolerating HD well. Encouraged her to stay active. There is no need to "push it" if she feels dizzy, lightheaded, or weak--but if she is concerned about the naps required after HD then trying to sleep during HD or keeping herself moving after may help w/ that fatigue. The fatigue may improve w/ time.   Insulin dependent type 2 diabetes mellitus, controlled Ordered new DM equipment due to patient being on [employer].  - Lantus refilled    Orders Placed This Encounter  Procedures  . POCT HgB A1C    Meds ordered this encounter  Medications  . glucose blood (ACCU-CHEK AVIVA) test strip    Sig: Use as  instructed    Dispense:  100 each    Refill:  12  . Lancet Devices (ACCU-CHEK SOFTCLIX) lancets    Sig: Use as instructed    Dispense:  100 each    Refill:  12  . Lancets Misc. (ACCU-CHEK SOFTCLIX LANCET DEV) KIT    Sig: 1 strip by Does not apply route 3 (three) times daily.    Dispense:  1 kit    Refill:  0  . Blood Glucose Monitoring Suppl (ACCU-CHEK AVIVA) device    Sig: Use as instructed    Dispense:  1 each    Refill:  0  . insulin glargine (LANTUS) 100 UNIT/ML injection    Sig: Inject 0.13 mLs (13 Units total) into the skin every morning. If blood sugar is >140 add 1 unit. If blood sugar is <140 subtract 1 unit.    Dispense:  10 mL    Refill:  2     Elberta Leatherwood, MD,MS,  PGY1 05/24/2015 7:19 PM

## 2015-05-25 LAB — PROTIME-INR: INR: 3.4 — AB (ref 0.9–1.1)

## 2015-05-26 ENCOUNTER — Encounter: Payer: Self-pay | Admitting: Family Medicine

## 2015-05-29 ENCOUNTER — Ambulatory Visit (INDEPENDENT_AMBULATORY_CARE_PROVIDER_SITE_OTHER): Payer: Commercial Managed Care - HMO | Admitting: Cardiovascular Disease

## 2015-05-29 DIAGNOSIS — I482 Chronic atrial fibrillation, unspecified: Secondary | ICD-10-CM

## 2015-05-29 DIAGNOSIS — Z5181 Encounter for therapeutic drug level monitoring: Secondary | ICD-10-CM

## 2015-06-08 LAB — PROTIME-INR: INR: 1.8 — AB (ref 0.9–1.1)

## 2015-06-09 ENCOUNTER — Ambulatory Visit (INDEPENDENT_AMBULATORY_CARE_PROVIDER_SITE_OTHER): Payer: Commercial Managed Care - HMO | Admitting: Cardiology

## 2015-06-09 ENCOUNTER — Encounter (INDEPENDENT_AMBULATORY_CARE_PROVIDER_SITE_OTHER): Payer: Commercial Managed Care - HMO | Admitting: Ophthalmology

## 2015-06-09 DIAGNOSIS — H35033 Hypertensive retinopathy, bilateral: Secondary | ICD-10-CM

## 2015-06-09 DIAGNOSIS — I10 Essential (primary) hypertension: Secondary | ICD-10-CM

## 2015-06-09 DIAGNOSIS — E11331 Type 2 diabetes mellitus with moderate nonproliferative diabetic retinopathy with macular edema: Secondary | ICD-10-CM | POA: Diagnosis not present

## 2015-06-09 DIAGNOSIS — H43813 Vitreous degeneration, bilateral: Secondary | ICD-10-CM

## 2015-06-09 DIAGNOSIS — Z5181 Encounter for therapeutic drug level monitoring: Secondary | ICD-10-CM

## 2015-06-09 DIAGNOSIS — I482 Chronic atrial fibrillation, unspecified: Secondary | ICD-10-CM

## 2015-06-09 DIAGNOSIS — E11311 Type 2 diabetes mellitus with unspecified diabetic retinopathy with macular edema: Secondary | ICD-10-CM | POA: Diagnosis not present

## 2015-06-13 ENCOUNTER — Other Ambulatory Visit: Payer: Self-pay | Admitting: *Deleted

## 2015-06-13 MED ORDER — OMEPRAZOLE 20 MG PO CPDR: 20.0000 mg | DELAYED_RELEASE_CAPSULE | Freq: Every day | ORAL | Status: DC

## 2015-06-16 ENCOUNTER — Other Ambulatory Visit (INDEPENDENT_AMBULATORY_CARE_PROVIDER_SITE_OTHER): Payer: Commercial Managed Care - HMO | Admitting: Ophthalmology

## 2015-06-16 DIAGNOSIS — E11331 Type 2 diabetes mellitus with moderate nonproliferative diabetic retinopathy with macular edema: Secondary | ICD-10-CM

## 2015-06-16 DIAGNOSIS — E11311 Type 2 diabetes mellitus with unspecified diabetic retinopathy with macular edema: Secondary | ICD-10-CM

## 2015-06-23 ENCOUNTER — Ambulatory Visit (INDEPENDENT_AMBULATORY_CARE_PROVIDER_SITE_OTHER): Payer: Commercial Managed Care - HMO | Admitting: Cardiology

## 2015-06-23 DIAGNOSIS — Z5181 Encounter for therapeutic drug level monitoring: Secondary | ICD-10-CM

## 2015-06-23 DIAGNOSIS — I482 Chronic atrial fibrillation, unspecified: Secondary | ICD-10-CM

## 2015-06-23 LAB — PROTIME-INR: INR: 3.3 — AB (ref 0.9–1.1)

## 2015-07-04 ENCOUNTER — Other Ambulatory Visit: Payer: Self-pay | Admitting: *Deleted

## 2015-07-04 DIAGNOSIS — M79642 Pain in left hand: Secondary | ICD-10-CM

## 2015-07-04 DIAGNOSIS — T82510A Breakdown (mechanical) of surgically created arteriovenous fistula, initial encounter: Secondary | ICD-10-CM

## 2015-07-06 LAB — PROTIME-INR: INR: 3.2 — AB (ref 0.9–1.1)

## 2015-07-07 ENCOUNTER — Ambulatory Visit (INDEPENDENT_AMBULATORY_CARE_PROVIDER_SITE_OTHER): Payer: Commercial Managed Care - HMO | Admitting: Internal Medicine

## 2015-07-07 DIAGNOSIS — Z5181 Encounter for therapeutic drug level monitoring: Secondary | ICD-10-CM

## 2015-07-07 DIAGNOSIS — I482 Chronic atrial fibrillation, unspecified: Secondary | ICD-10-CM

## 2015-07-13 ENCOUNTER — Encounter: Payer: Self-pay | Admitting: Vascular Surgery

## 2015-07-13 LAB — PROTIME-INR: INR: 2.7 — AB (ref 0.9–1.1)

## 2015-07-14 ENCOUNTER — Ambulatory Visit (HOSPITAL_COMMUNITY)
Admission: RE | Admit: 2015-07-14 | Discharge: 2015-07-14 | Disposition: A | Discharge status: Home / Self Care | Payer: Commercial Managed Care - HMO | Source: Ambulatory Visit | Attending: Vascular Surgery | Admitting: Vascular Surgery

## 2015-07-14 ENCOUNTER — Encounter: Payer: Self-pay | Admitting: Vascular Surgery

## 2015-07-14 ENCOUNTER — Ambulatory Visit (INDEPENDENT_AMBULATORY_CARE_PROVIDER_SITE_OTHER): Payer: Self-pay | Admitting: Vascular Surgery

## 2015-07-14 VITALS — BP 114/85 | HR 81 | Temp 98.0°F | Resp 18 | Ht 60.0 in | Wt 168.5 lb

## 2015-07-14 DIAGNOSIS — Y832 Surgical operation with anastomosis, bypass or graft as the cause of abnormal reaction of the patient, or of later complication, without mention of misadventure at the time of the procedure: Secondary | ICD-10-CM | POA: Diagnosis not present

## 2015-07-14 DIAGNOSIS — T82510A Breakdown (mechanical) of surgically created arteriovenous fistula, initial encounter: Secondary | ICD-10-CM | POA: Diagnosis not present

## 2015-07-14 DIAGNOSIS — M79642 Pain in left hand: Secondary | ICD-10-CM

## 2015-07-14 DIAGNOSIS — Z992 Dependence on renal dialysis: Principal | ICD-10-CM

## 2015-07-14 DIAGNOSIS — N186 End stage renal disease: Secondary | ICD-10-CM

## 2015-07-14 NOTE — Progress Notes (Signed)
    Postoperative Access Visit   History of Present Illness  Kristi [name] is a 74 y.o. year old female who presents for postoperative follow-up for: L BVT, L RC AVF ligation (Date: 03/24/15) by Dr. Scot Dock.  The patient's wounds are healed.  The patient notes steal symptoms: persistent left finger numbness which worsened on HD.  The patient is still able to complete their activities of daily living.     For VQI Use Only  PRE-ADM LIVING: Home  AMB STATUS: Ambulatory   Physical Examination Filed Vitals:   07/14/15 1245  BP: 114/85  Pulse: 81  Temp: 98 F (36.7 C)  Resp: 18    LUE: Incision is healed, skin feels warm proximally with cool hand, hand grip is 4-5/5, sensation in digits is decreased, +palpable thrill, bruit can be auscultated , echymosis distally from infilitration  L arm steal study (07/14/2015): 22 mm Hg to 90 mm Hg thumb pressure augmentation   Medical Decision Making  Kristi [name] is a 74 y.o. year old female who presents s/p L BVT complicated with steal syndrome .  I discussed the options with the patient: observation vs ligation vs banding/plication of the fistula.  I don't recommend DRIL in this situation.  Thank you for allowing [place] to participate in this patient's care.  Adele Barthel, MD [facility] Office: [phone] Pager: [phone]  07/14/2015, 12:57 PM

## 2015-07-17 ENCOUNTER — Ambulatory Visit (INDEPENDENT_AMBULATORY_CARE_PROVIDER_SITE_OTHER): Payer: Commercial Managed Care - HMO | Admitting: Cardiology

## 2015-07-17 DIAGNOSIS — I482 Chronic atrial fibrillation, unspecified: Secondary | ICD-10-CM

## 2015-07-17 DIAGNOSIS — Z5181 Encounter for therapeutic drug level monitoring: Secondary | ICD-10-CM

## 2015-07-20 LAB — PROTIME-INR: INR: 3.8 — AB (ref 0.9–1.1)

## 2015-07-21 ENCOUNTER — Ambulatory Visit (INDEPENDENT_AMBULATORY_CARE_PROVIDER_SITE_OTHER): Payer: Commercial Managed Care - HMO | Admitting: Cardiology

## 2015-07-21 DIAGNOSIS — Z5181 Encounter for therapeutic drug level monitoring: Secondary | ICD-10-CM

## 2015-07-21 DIAGNOSIS — I482 Chronic atrial fibrillation, unspecified: Secondary | ICD-10-CM

## 2015-07-24 ENCOUNTER — Telehealth: Payer: Self-pay | Admitting: Family Medicine

## 2015-07-24 NOTE — Telephone Encounter (Signed)
Need a referral faxed over to [facility] for patient.  Have an appt on 8/24 on Wed.  Will be seeing Dr. Nelva Bush.  Please inform when referral have been completed.

## 2015-07-24 NOTE — Telephone Encounter (Signed)
[employer] referral currently expired, new referral entered via [employer]. Referral currently suspended ID# [id number].

## 2015-07-26 ENCOUNTER — Other Ambulatory Visit: Payer: Self-pay | Admitting: *Deleted

## 2015-07-27 LAB — PROTIME-INR: INR: 5 — AB (ref 0.9–1.1)

## 2015-07-28 ENCOUNTER — Ambulatory Visit (INDEPENDENT_AMBULATORY_CARE_PROVIDER_SITE_OTHER): Payer: Commercial Managed Care - HMO | Admitting: Cardiovascular Disease

## 2015-07-28 DIAGNOSIS — Z5181 Encounter for therapeutic drug level monitoring: Secondary | ICD-10-CM

## 2015-07-28 DIAGNOSIS — I482 Chronic atrial fibrillation, unspecified: Secondary | ICD-10-CM

## 2015-07-31 ENCOUNTER — Ambulatory Visit (INDEPENDENT_AMBULATORY_CARE_PROVIDER_SITE_OTHER): Payer: Commercial Managed Care - HMO | Admitting: Pharmacist

## 2015-07-31 DIAGNOSIS — Z5181 Encounter for therapeutic drug level monitoring: Secondary | ICD-10-CM

## 2015-07-31 DIAGNOSIS — I482 Chronic atrial fibrillation, unspecified: Secondary | ICD-10-CM

## 2015-07-31 LAB — POCT INR: INR: 1.9

## 2015-08-01 ENCOUNTER — Telehealth: Payer: Self-pay | Admitting: Pharmacist

## 2015-08-01 NOTE — Telephone Encounter (Signed)
Pt aware of recommendations.  Will start holding Coumadin on 8/28.  Will recheck labs at HD on 9/8

## 2015-08-01 NOTE — Telephone Encounter (Signed)
-----   Message from Jettie Booze, MD sent at 08/01/2015  1:57 PM EDT ----- OK not to bridge.   JV ----- Message -----    From: Aris Georgia, [facility]    Sent: 07/31/2015  12:45 PM      To: Jettie Booze, MD  Can you take a look at this patient?  She is scheduled to have a fistula redo next Friday.  She has a history of CVA in her chart but pt doesn't recall much about it- she just says they told her she may have had a small issue many years ago (sounded like TIA).  She has undergone 2 other fistula procedures with no Lovenox bridge earlier this year.  Are you okay if we do no bridge her this time or would you prefer to be bridged?  Thanks!

## 2015-08-02 ENCOUNTER — Ambulatory Visit: Payer: Commercial Managed Care - HMO | Admitting: Family Medicine

## 2015-08-09 ENCOUNTER — Other Ambulatory Visit: Payer: Self-pay | Admitting: Interventional Cardiology

## 2015-08-09 ENCOUNTER — Encounter: Payer: Self-pay | Admitting: Family Medicine

## 2015-08-09 ENCOUNTER — Ambulatory Visit (INDEPENDENT_AMBULATORY_CARE_PROVIDER_SITE_OTHER): Payer: Commercial Managed Care - HMO | Admitting: Family Medicine

## 2015-08-09 VITALS — BP 124/46 | HR 89 | Temp 98.1°F | Ht 60.0 in | Wt 169.1 lb

## 2015-08-09 DIAGNOSIS — M549 Dorsalgia, unspecified: Secondary | ICD-10-CM | POA: Diagnosis not present

## 2015-08-09 DIAGNOSIS — M545 Low back pain, unspecified: Secondary | ICD-10-CM

## 2015-08-09 DIAGNOSIS — G8929 Other chronic pain: Secondary | ICD-10-CM

## 2015-08-09 DIAGNOSIS — R1010 Upper abdominal pain, unspecified: Secondary | ICD-10-CM

## 2015-08-09 DIAGNOSIS — R109 Unspecified abdominal pain: Secondary | ICD-10-CM

## 2015-08-09 MED ORDER — OXYCODONE-ACETAMINOPHEN 5-325 MG PO TABS: 1.0000 | ORAL_TABLET | Freq: Every day | ORAL | Status: DC | PRN

## 2015-08-09 NOTE — Patient Instructions (Signed)
It was a pleasure seeing you today in our clinic. Today we discussed your back pain. Here is the treatment plan we have discussed and agreed upon together:   - We have ordered a Urinalysis to be done. If it appears to be suggestive of a UTI we may have to prescribe you some antibiotics. I will call you about this if that is the case. - Come back and see me if your back pain does not improve or worsens over the next 2 weeks.

## 2015-08-10 ENCOUNTER — Encounter (HOSPITAL_COMMUNITY): Payer: Self-pay | Admitting: *Deleted

## 2015-08-10 NOTE — Progress Notes (Signed)
Pt has a history of SOB with exertion but denies chest pain. Pt is under the care of Dr. Irish Lack, cardiology. Pt made aware to stop NSAID's, otc vitamins and herbal medications such as Glucosamine, Fish Oil and Cinnamon. Pt stated that her last dose of Coumadin was Saturday, August 05, 2015. Pt chart forwarded to [place], [place], anesthesia, to review cardiac history. Pt verbalized understanding of all pre-op instructions,

## 2015-08-10 NOTE — Progress Notes (Signed)
Anesthesia Chart Review:  Pt is 74 year old female scheduled for plication of L basilic vein transposition fistula on 08/11/2015 with Dr. Scot Dock.   Pt is a same day work up.   Cardiologist is Dr. Irish Lack.   PMH includes: CAD (RCA stent after STEMI 06/2007), CVA (2011), pulmonary hypertension, CHF, atrial fibrillation, DM, hyperlipidemia, HTN, anemia, hyperparathyroidism, COPD, DVT, ESRD on dialysis. Former smoker. BMI 33. S/p L basilic vein transposition 03/24/15. S/p insertion of dialysis catheter 01/12/15. S/p ligation of AV fistula 10/29/11.   Hospitalized 2/1-2/17/16 for acute hypoxemic respiratory failure due to volume overload from ESRD/CKD, hyperkalemia, and acidosis.  Complicated by newly developed atrial fibrillation.   Medications include: albuterol, calcitriol, iron, lasix, lantus, metoprolol, prilosec, pravastatin, tiotropium, coumadin. Last coumadin dose 08/05/15.   I-stat 4, PT/PTT to be obtained DOS.   1 view CXR 01/19/15:  1. New nasogastric tube is in good position. 2. Interval clearing of previously noted opacities.  EKG 01/13/2015: Atrial fibrillation. HR 88. Possible Inferior infarct, age undetermined. Non-specific ST-t changes  Echo 01/11/2015: - Left ventricle: The cavity size was normal. Wall thickness wasincreased in a pattern of mild LVH. Systolic function was normal.The estimated ejection fraction was in the range of 55% to 60%.Although no diagnostic regional wall motion abnormality wasidentified, this possibility cannot be completely excluded on thebasis of this study. Features are consistent with a pseudonormalleft ventricular filling pattern, with concomitant abnormalrelaxation and increased filling pressure (grade 2 diastolicdysfunction). - Ventricular septum: D-shaped interventricular septum suggestingRV pressure/volume overload. - Mitral valve: Mildly calcified annulus. There was no significantregurgitation. - Left atrium: The atrium was mildly dilated. -  Right ventricle: The cavity size was moderately to severely dilated. Systolic function was mildly reduced. - Right atrium: The atrium was moderately to severely dilated. - Tricuspid valve: There was mild-moderate regurgitation. PeakRV-RA gradient (S): 80 mm Hg. - Systemic veins: IVC was not visualized. -Impressions: Normal LV size with mild LV hypertrophy. EF 55-60%. Moderatediastolic dysfunction. D-shaped interventricular septumsuggestive of RV pressure/volume overload. Moderate to severelydilated RV with mildly decreased systolic function. Mild LAE,moderate to severe RAE. Mild to moderate TR. Severe pulmonaryhypertension. IVC not visualized.  Cardiac cath 07/14/2007: 1. Moderate atherosclerosis of the OM1 of about 40-50% 2. No renal artery stenosis  If no changes and labs are acceptable DOS, I anticipate pt can proceed with surgery as scheduled.   Willeen Cass, FNP-BC [facility]/Anesthesiology Phone: [phone] 08/10/2015 12:18 PM

## 2015-08-11 ENCOUNTER — Encounter (HOSPITAL_COMMUNITY)
Admission: RE | Disposition: A | Discharge status: Home / Self Care | Payer: Self-pay | Source: Ambulatory Visit | Attending: Vascular Surgery

## 2015-08-11 ENCOUNTER — Ambulatory Visit (HOSPITAL_COMMUNITY)
Admission: RE | Admit: 2015-08-11 | Discharge: 2015-08-11 | Disposition: A | Discharge status: Home / Self Care | Payer: Commercial Managed Care - HMO | Source: Ambulatory Visit | Attending: Vascular Surgery | Admitting: Vascular Surgery

## 2015-08-11 ENCOUNTER — Ambulatory Visit (HOSPITAL_COMMUNITY): Payer: Commercial Managed Care - HMO | Admitting: Emergency Medicine

## 2015-08-11 ENCOUNTER — Encounter (HOSPITAL_COMMUNITY): Payer: Self-pay | Admitting: *Deleted

## 2015-08-11 DIAGNOSIS — I252 Old myocardial infarction: Secondary | ICD-10-CM | POA: Diagnosis not present

## 2015-08-11 DIAGNOSIS — Z794 Long term (current) use of insulin: Secondary | ICD-10-CM | POA: Insufficient documentation

## 2015-08-11 DIAGNOSIS — I120 Hypertensive chronic kidney disease with stage 5 chronic kidney disease or end stage renal disease: Secondary | ICD-10-CM | POA: Insufficient documentation

## 2015-08-11 DIAGNOSIS — E119 Type 2 diabetes mellitus without complications: Secondary | ICD-10-CM | POA: Diagnosis not present

## 2015-08-11 DIAGNOSIS — Y832 Surgical operation with anastomosis, bypass or graft as the cause of abnormal reaction of the patient, or of later complication, without mention of misadventure at the time of the procedure: Secondary | ICD-10-CM | POA: Diagnosis not present

## 2015-08-11 DIAGNOSIS — Z87891 Personal history of nicotine dependence: Secondary | ICD-10-CM | POA: Diagnosis not present

## 2015-08-11 DIAGNOSIS — T82898A Other specified complication of vascular prosthetic devices, implants and grafts, initial encounter: Secondary | ICD-10-CM | POA: Diagnosis not present

## 2015-08-11 DIAGNOSIS — I2510 Atherosclerotic heart disease of native coronary artery without angina pectoris: Secondary | ICD-10-CM | POA: Diagnosis not present

## 2015-08-11 DIAGNOSIS — Z992 Dependence on renal dialysis: Secondary | ICD-10-CM | POA: Diagnosis not present

## 2015-08-11 DIAGNOSIS — J449 Chronic obstructive pulmonary disease, unspecified: Secondary | ICD-10-CM | POA: Insufficient documentation

## 2015-08-11 DIAGNOSIS — N186 End stage renal disease: Secondary | ICD-10-CM | POA: Insufficient documentation

## 2015-08-11 HISTORY — PX: REVISON OF ARTERIOVENOUS FISTULA: SHX6074

## 2015-08-11 HISTORY — DX: Headache: R51

## 2015-08-11 HISTORY — DX: Cardiac arrhythmia, unspecified: I49.9

## 2015-08-11 HISTORY — DX: Headache, unspecified: R51.9

## 2015-08-11 LAB — POCT I-STAT 4, (NA,K, GLUC, HGB,HCT)
GLUCOSE: 126 mg/dL — AB (ref 65–99)
HEMATOCRIT: 37 % (ref 36.0–46.0)
HEMOGLOBIN: 12.6 g/dL (ref 12.0–15.0)
Potassium: 3.5 mmol/L (ref 3.5–5.1)
SODIUM: 136 mmol/L (ref 135–145)

## 2015-08-11 LAB — PROTIME-INR
INR: 1.11 (ref 0.00–1.49)
Prothrombin Time: 14.5 seconds (ref 11.6–15.2)

## 2015-08-11 LAB — GLUCOSE, CAPILLARY: Glucose-Capillary: 92 mg/dL (ref 65–99)

## 2015-08-11 LAB — APTT: aPTT: 28 seconds (ref 24–37)

## 2015-08-11 SURGERY — REVISON OF ARTERIOVENOUS FISTULA
Anesthesia: General | Site: Arm Upper | Laterality: Left

## 2015-08-11 MED ORDER — ONDANSETRON HCL 4 MG/2ML IJ SOLN
INTRAMUSCULAR | Status: AC
  Filled 2015-08-11: qty 2

## 2015-08-11 MED ORDER — SODIUM CHLORIDE 0.9 % IR SOLN
Status: DC | PRN
  Administered 2015-08-11: 1000 mL

## 2015-08-11 MED ORDER — PROMETHAZINE HCL 25 MG/ML IJ SOLN
6.2500 mg | INTRAMUSCULAR | Status: DC | PRN
Start: 2015-08-11 — End: 2015-08-11

## 2015-08-11 MED ORDER — CHLORHEXIDINE GLUCONATE CLOTH 2 % EX PADS: 6.0000 | MEDICATED_PAD | Freq: Once | CUTANEOUS | Status: DC

## 2015-08-11 MED ORDER — DEXTROSE 5 % IV SOLN
1.5000 g | INTRAVENOUS | Status: AC
  Administered 2015-08-11: 1.5 g via INTRAVENOUS

## 2015-08-11 MED ORDER — LIDOCAINE HCL (PF) 1 % IJ SOLN
INTRAMUSCULAR | Status: AC
  Filled 2015-08-11: qty 30

## 2015-08-11 MED ORDER — PROPOFOL 10 MG/ML IV BOLUS
INTRAVENOUS | Status: DC | PRN
  Administered 2015-08-11: 160 mg via INTRAVENOUS

## 2015-08-11 MED ORDER — SODIUM CHLORIDE 0.9 % IV SOLN
INTRAVENOUS | Status: DC
  Administered 2015-08-11 (×2): via INTRAVENOUS

## 2015-08-11 MED ORDER — FENTANYL CITRATE (PF) 250 MCG/5ML IJ SOLN
INTRAMUSCULAR | Status: AC
  Filled 2015-08-11: qty 5

## 2015-08-11 MED ORDER — MIDAZOLAM HCL 2 MG/2ML IJ SOLN
INTRAMUSCULAR | Status: AC
  Filled 2015-08-11: qty 4

## 2015-08-11 MED ORDER — OXYCODONE-ACETAMINOPHEN 5-325 MG PO TABS: 1.0000 | ORAL_TABLET | ORAL | Status: DC | PRN

## 2015-08-11 MED ORDER — THROMBIN 20000 UNITS EX SOLR
CUTANEOUS | Status: AC
  Filled 2015-08-11: qty 20000

## 2015-08-11 MED ORDER — LIDOCAINE HCL (CARDIAC) 20 MG/ML IV SOLN
INTRAVENOUS | Status: AC
  Filled 2015-08-11: qty 5

## 2015-08-11 MED ORDER — PROTAMINE SULFATE 10 MG/ML IV SOLN
INTRAVENOUS | Status: DC | PRN
  Administered 2015-08-11 (×3): 10 mg via INTRAVENOUS

## 2015-08-11 MED ORDER — LIDOCAINE HCL (CARDIAC) 20 MG/ML IV SOLN
INTRAVENOUS | Status: DC | PRN
  Administered 2015-08-11: 60 mg via INTRAVENOUS

## 2015-08-11 MED ORDER — PROTAMINE SULFATE 10 MG/ML IV SOLN
INTRAVENOUS | Status: AC
  Filled 2015-08-11: qty 5

## 2015-08-11 MED ORDER — PHENYLEPHRINE 40 MCG/ML (10ML) SYRINGE FOR IV PUSH (FOR BLOOD PRESSURE SUPPORT)
PREFILLED_SYRINGE | INTRAVENOUS | Status: AC
  Filled 2015-08-11: qty 10

## 2015-08-11 MED ORDER — DEXTROSE 5 % IV SOLN
INTRAVENOUS | Status: AC
  Filled 2015-08-11: qty 1.5

## 2015-08-11 MED ORDER — PROPOFOL 10 MG/ML IV BOLUS
INTRAVENOUS | Status: AC
  Filled 2015-08-11: qty 20

## 2015-08-11 MED ORDER — HEPARIN SODIUM (PORCINE) 1000 UNIT/ML IJ SOLN
INTRAMUSCULAR | Status: DC | PRN
  Administered 2015-08-11: 6000 [IU] via INTRAVENOUS

## 2015-08-11 MED ORDER — HEPARIN SODIUM (PORCINE) 1000 UNIT/ML IJ SOLN
INTRAMUSCULAR | Status: AC
  Filled 2015-08-11: qty 1

## 2015-08-11 MED ORDER — ONDANSETRON HCL 4 MG/2ML IJ SOLN
INTRAMUSCULAR | Status: DC | PRN
  Administered 2015-08-11: 4 mg via INTRAVENOUS

## 2015-08-11 MED ORDER — SODIUM CHLORIDE 0.9 % IR SOLN
Status: DC | PRN
  Administered 2015-08-11: 10:00:00

## 2015-08-11 MED ORDER — DEXTROSE 5 % IV SOLN
INTRAVENOUS | Status: DC | PRN
  Administered 2015-08-11: 10:00:00 via INTRAVENOUS

## 2015-08-11 MED ORDER — MIDAZOLAM HCL 5 MG/5ML IJ SOLN
INTRAMUSCULAR | Status: DC | PRN
  Administered 2015-08-11 (×2): 0.5 mg via INTRAVENOUS

## 2015-08-11 MED ORDER — HYDROMORPHONE HCL 1 MG/ML IJ SOLN: 0.2500 mg | INTRAMUSCULAR | Status: DC | PRN

## 2015-08-11 MED ORDER — PHENYLEPHRINE HCL 10 MG/ML IJ SOLN
10.0000 mg | INTRAVENOUS | Status: DC | PRN
  Administered 2015-08-11: 40 ug/min via INTRAVENOUS

## 2015-08-11 MED ORDER — LIDOCAINE HCL (PF) 1 % IJ SOLN
INTRAMUSCULAR | Status: DC | PRN
  Administered 2015-08-11: 4 mL via INTRADERMAL

## 2015-08-11 MED ORDER — FENTANYL CITRATE (PF) 100 MCG/2ML IJ SOLN
INTRAMUSCULAR | Status: DC | PRN
  Administered 2015-08-11 (×3): 25 ug via INTRAVENOUS

## 2015-08-11 MED ORDER — PHENYLEPHRINE HCL 10 MG/ML IJ SOLN
INTRAMUSCULAR | Status: DC | PRN
  Administered 2015-08-11 (×2): 80 ug via INTRAVENOUS

## 2015-08-11 SURGICAL SUPPLY — 30 items
CANISTER SUCTION 2500CC (MISCELLANEOUS) ×3 IMPLANT
CANNULA VESSEL 3MM 2 BLNT TIP (CANNULA) IMPLANT
CLIP TI MEDIUM 6 (CLIP) ×3 IMPLANT
CLIP TI WIDE RED SMALL 6 (CLIP) ×3 IMPLANT
COVER PROBE W GEL 5X96 (DRAPES) ×3 IMPLANT
ELECT REM PT RETURN 9FT ADLT (ELECTROSURGICAL) ×3
ELECTRODE REM PT RTRN 9FT ADLT (ELECTROSURGICAL) ×1 IMPLANT
GLOVE BIO SURGEON STRL SZ7.5 (GLOVE) ×3 IMPLANT
GLOVE BIOGEL PI IND STRL 6.5 (GLOVE) ×2 IMPLANT
GLOVE BIOGEL PI IND STRL 8 (GLOVE) ×1 IMPLANT
GLOVE BIOGEL PI INDICATOR 6.5 (GLOVE) ×4
GLOVE BIOGEL PI INDICATOR 8 (GLOVE) ×2
GLOVE ECLIPSE 6.5 STRL STRAW (GLOVE) ×3 IMPLANT
GLOVE SURG SS PI 6.0 STRL IVOR (GLOVE) ×3 IMPLANT
GOWN STRL REUS W/ TWL LRG LVL3 (GOWN DISPOSABLE) ×3 IMPLANT
GOWN STRL REUS W/TWL LRG LVL3 (GOWN DISPOSABLE) ×6
GRAFT GORETEX 6X10 (Vascular Products) ×3 IMPLANT
KIT BASIN OR (CUSTOM PROCEDURE TRAY) ×3 IMPLANT
KIT ROOM TURNOVER OR (KITS) ×3 IMPLANT
LIQUID BAND (GAUZE/BANDAGES/DRESSINGS) ×3 IMPLANT
NS IRRIG 1000ML POUR BTL (IV SOLUTION) ×3 IMPLANT
PACK CV ACCESS (CUSTOM PROCEDURE TRAY) ×3 IMPLANT
PAD ARMBOARD 7.5X6 YLW CONV (MISCELLANEOUS) ×6 IMPLANT
SPONGE SURGIFOAM ABS GEL 100 (HEMOSTASIS) IMPLANT
SUT PROLENE 6 0 BV (SUTURE) ×9 IMPLANT
SUT VIC AB 3-0 SH 27 (SUTURE) ×2
SUT VIC AB 3-0 SH 27X BRD (SUTURE) ×1 IMPLANT
SUT VICRYL 4-0 PS2 18IN ABS (SUTURE) ×3 IMPLANT
UNDERPAD 30X30 INCONTINENT (UNDERPADS AND DIAPERS) ×3 IMPLANT
WATER STERILE IRR 1000ML POUR (IV SOLUTION) ×3 IMPLANT

## 2015-08-11 NOTE — Anesthesia Procedure Notes (Signed)
Procedure Name: LMA Insertion Date/Time: 08/11/2015 9:58 AM Performed by: Williemae Area B Pre-anesthesia Checklist: Patient identified, Emergency Drugs available, Suction available and Patient being monitored Patient Re-evaluated:Patient Re-evaluated prior to inductionOxygen Delivery Method: Circle system utilized Preoxygenation: Pre-oxygenation with 100% oxygen Intubation Type: IV induction Ventilation: Mask ventilation without difficulty LMA: LMA inserted LMA Size: 4.0 Tube type: Oral Number of attempts: 1 Placement Confirmation: positive ETCO2 and breath sounds checked- equal and bilateral Tube secured with: Tape (taped across cheeks) Dental Injury: Teeth and Oropharynx as per pre-operative assessment

## 2015-08-11 NOTE — Interval H&P Note (Signed)
History and Physical Interval Note:  08/11/2015 9:22 AM  Kristi [name]  has presented today for surgery, with the diagnosis of Chronic kidney disease  The various methods of treatment have been discussed with the patient and family. After consideration of risks, benefits and other options for treatment, the patient has consented to  Procedure(s): PLICATION OF BASILIC VEIN TRANSPOSITION FISTULA (Left) as a surgical intervention .  The patient's history has been reviewed, patient examined, no change in status, stable for surgery.  I have reviewed the patient's chart and labs.  Questions were answered to the patient's satisfaction.     Deitra Mayo

## 2015-08-11 NOTE — Progress Notes (Signed)
PT WITH DISCHARGE BP 83/38. CONFIRMED WITH PATIENT THIS IS HER NORMAL BASELINE SINCE SHE BEGAN DIALYSIS TREATMENTS.

## 2015-08-11 NOTE — H&P (View-Only) (Signed)
    Postoperative Access Visit   History of Present Illness  Kristi [name] is a 74 y.o. year old female who presents for postoperative follow-up for: L BVT, L RC AVF ligation (Date: 03/24/15) by Dr. Dickson.  The patient's wounds are healed.  The patient notes steal symptoms: persistent left finger numbness which worsened on HD.  The patient is still able to complete their activities of daily living.     For VQI Use Only  PRE-ADM LIVING: Home  AMB STATUS: Ambulatory   Physical Examination Filed Vitals:   07/14/15 1245  BP: 114/85  Pulse: 81  Temp: 98 F (36.7 C)  Resp: 18    LUE: Incision is healed, skin feels warm proximally with cool hand, hand grip is 4-5/5, sensation in digits is decreased, +palpable thrill, bruit can be auscultated , echymosis distally from infilitration  L arm steal study (07/14/2015): 22 mm Hg to 90 mm Hg thumb pressure augmentation   Medical Decision Making  Kristi [name] is a 74 y.o. year old female who presents s/p L BVT complicated with steal syndrome .  I discussed the options with the patient: observation vs ligation vs banding/plication of the fistula.  I don't recommend DRIL in this situation.  Thank you for allowing us to participate in this patient's care.  Anyeli Hockenbury, MD [facility] Office: [phone] Pager: [phone]  07/14/2015, 12:57 PM    

## 2015-08-11 NOTE — Op Note (Signed)
    NAME: Kristi [name]   MRN: [id number] DOB: 1940-12-15    DATE OF OPERATION: 08/11/2015  PREOP DIAGNOSIS: Steal syndrome left upper extremity  POSTOP DIAGNOSIS: Steal syndrome left upper extremity  PROCEDURE: Banding of left basilic vein transposition  SURGEON: Judeth Cornfield. Scot Dock, MD, FACS  ASSIST: Izetta Dakin RNFA  ANESTHESIA: Gen.   EBL: minimal  INDICATIONS: [name] is a 74 y.o. female who developed steal syndrome after a left basilic vein transposition. She was not felt to be good candidate for a DRIL procedure and she was set up for plication or banding of her fistula.  FINDINGS: at the completion the radial pulse was palpable but diminished. There was improved Doppler signals. It was still a good thrill in the fistula.  TECHNIQUE: The patient was taken to the operating room and received a general anesthetic. The left upper extremity was prepped and draped in usual sterile fashion. Incision was made over the fistula just above the antecubital level. Here the fistula was dissected free down to where it was anastomosed to the brachial artery. It was aneurysmal here. I elected to band this using a 6 mm Gore-Tex graft. A short segment approximately 2 cm in length was cut and this was opened longitudinally. This was then sewn around the proximal aspect of the fistula with running 60 proline suture thus narrowing the fistula. At the completion was a diminished but palpable left radial pulse. There was still a good thrill in the fistula. Of note I tried the band in multiple positions and tried to select the area where there seemed to be  Preserved flow in the fistula and also improved signal at the wrist. Hemostasis was obtained in the wound and the wound was closed with the therapy of Vicryl and the skin closed with 4-0 Vicryl. Liquid band was applied. The patient tolerated the procedure well and transferred to the recovery room in stable condition. All needle and sponge counts  were correct.  Deitra Mayo, MD, FACS [facility]  DATE OF DICTATION:   08/11/2015

## 2015-08-11 NOTE — Anesthesia Preprocedure Evaluation (Addendum)
Anesthesia Evaluation  Patient identified by MRN, date of birth, ID band Patient awake    Reviewed: Allergy & Precautions, NPO status , Patient's Chart, lab work & pertinent test results  History of Anesthesia Complications Negative for: history of anesthetic complications  Airway Mallampati: II  TM Distance: >3 FB Neck ROM: Full    Dental  (+) Teeth Intact, Dental Advisory Given, Missing   Pulmonary COPDformer smoker,  Use of home O2 now.  Quit cigs a while back.   Pulmonary exam normal       Cardiovascular hypertension, Pt. on home beta blockers and Pt. on medications + CAD, + Past MI, + Peripheral Vascular Disease and +CHF Normal cardiovascular exam+ dysrhythmias  Echo 01/11/2015: - Left ventricle: The cavity size was normal. Wall thickness wasincreased in a pattern of mild LVH. Systolic function was normal.The estimated ejection fraction was in the range of 55% to 60%.Although no diagnostic regional wall motion abnormality wasidentified, this possibility cannot be completely excluded on thebasis of this study. Features are consistent with a pseudonormalleft ventricular filling pattern, with concomitant abnormalrelaxation and increased filling pressure (grade 2 diastolicdysfunction). - Ventricular septum: D-shaped interventricular septum suggestingRV pressure/volume overload. - Mitral valve: Mildly calcified annulus. There was no significantregurgitation. - Left atrium: The atrium was mildly dilated. - Right ventricle: The cavity size was moderately to severely dilated. Systolic function was mildly reduced. - Right atrium: The atrium was moderately to severely dilated. - Tricuspid valve: There was mild-moderate regurgitation. PeakRV-RA gradient (S): 80 mm Hg. - Systemic veins: IVC was not visualized. -Impressions: Normal LV size with mild LV hypertrophy. EF 55-60%. Moderatediastolic dysfunction. D-shaped interventricular  septumsuggestive of RV pressure/volume overload. Moderate to severelydilated RV with mildly decreased systolic function. Mild LAE,moderate to severe RAE. Mild to moderate TR. Severe pulmonaryhypertension. IVC not visualized.    Neuro/Psych  Headaches, Depression CVA    GI/Hepatic Neg liver ROS, GERD-  Medicated,Takes sporadic rx for reflux   Endo/Other  diabetes, Well Controlled, Type 2, Insulin Dependent  Renal/GU ESRF and DialysisRenal diseaseT-Th-Sat via diatek cath     Musculoskeletal   Abdominal   Peds  Hematology   Anesthesia Other Findings Missing some teeth in back  Reproductive/Obstetrics                          Anesthesia Physical Anesthesia Plan  ASA: III  Anesthesia Plan: General   Post-op Pain Management:    Induction: Intravenous  Airway Management Planned: LMA  Additional Equipment:   Intra-op Plan:   Post-operative Plan: Extubation in OR  Informed Consent: I have reviewed the patients History and Physical, chart, labs and discussed the procedure including the risks, benefits and alternatives for the proposed anesthesia with the patient or authorized representative who has indicated his/her understanding and acceptance.   Dental advisory given  Plan Discussed with: CRNA, Anesthesiologist and Surgeon  Anesthesia Plan Comments:         Anesthesia Quick Evaluation

## 2015-08-11 NOTE — Anesthesia Postprocedure Evaluation (Signed)
Anesthesia Post Note  Patient: Kristi [name]  Procedure(s) Performed: Procedure(s) (LRB): PLICATION OF BASILIC VEIN TRANSPOSITION FISTULA (Left)  Anesthesia type: General  Patient location: PACU  Post pain: Pain level controlled  Post assessment: Post-op Vital signs reviewed  Last Vitals: BP 83/38 mmHg  Pulse 63  Temp(Src) 36.2 C (Oral)  Resp 16  Ht 5' (1.524 m)  Wt 169 lb (76.658 kg)  BMI 33.01 kg/m2  SpO2 94%  Post vital signs: Reviewed  Level of consciousness: sedated  Complications: No apparent anesthesia complications

## 2015-08-11 NOTE — Transfer of Care (Signed)
Immediate Anesthesia Transfer of Care Note  Patient: PELAGIA [name]  Procedure(s) Performed: Procedure(s): PLICATION OF BASILIC VEIN TRANSPOSITION FISTULA (Left)  Patient Location: PACU  Anesthesia Type:General  Level of Consciousness: awake, alert , oriented and patient cooperative  Airway & Oxygen Therapy: Patient Spontanous Breathing and Patient connected to nasal cannula oxygen  Post-op Assessment: Report given to RN, Post -op Vital signs reviewed and stable and Patient moving all extremities  Post vital signs: Reviewed  Last Vitals:  Filed Vitals:   08/11/15 0801  BP: 104/43  Pulse: 81  Temp: 36.3 C  Resp: 20    Complications: No apparent anesthesia complications

## 2015-08-12 NOTE — Assessment & Plan Note (Addendum)
Back pain is c/w muscular spasm of the perispinal. Cannot r/o UTI vs. Compression frxr but these are less likely w/ PE findings. Patient is oliguric/anuric making UA collection very difficult.  - refilled pain medication, asked to f/u in a few weeks - if pain persists or worsens >> imaging may be warranted 2/2 to risks of compression frxr (ESRD and osteoporosis)  Will also reassess Arm pain after surgery

## 2015-08-12 NOTE — Progress Notes (Signed)
   HPI  CC: back/arm pain Patient here w/ Lt arm and back pain, and generalized malaise. Has been feeling this way for the past few day. Dialysis is going well and does not seem to have an association w/ this. Pain localized to Lt distal humerus and low back. No radiation. No trauma. No reduced functionality. No signs of infection. Patient hold that her surgery scheduled for Friday helps solve the arm pain. Denies leg weakness or incontinence.   Review of Systems   See HPI for ROS. All other systems reviewed and are negative.  Past medical history and social history reviewed and updated in the EMR as appropriate.  Objective: BP 124/46 mmHg  Pulse 89  Temp(Src) 98.1 F (36.7 C) (Oral)  Ht 5' (1.524 m)  Wt 169 lb 1.6 oz (76.703 kg)  BMI 33.02 kg/m2 Gen: NAD, alert, cooperative, and pleasant. HEENT: NCAT, EOMI, PERRL CV: RRR, no murmur, Lt arm bruit Resp: CTAB Abd: SNTND, BS present, no guarding or organomegaly Ext: No edema, warm, FROM, strength/sensation intact. No bony deformities, no skin lesions, erythema, or ecchymosis. TTP of perispinal muscles ~L4/5. No tenderness of spinous process. Neg straight leg raise. No CVA/suprapubic tenderness. Pulses present and cap refill present throughout.  Neuro: Alert and oriented, Speech clear, No gross deficits  Assessment and plan:  Chronic back pain Back pain is c/w muscular spasm of the perispinal. Cannot r/o UTI vs. Compression frxr but these are less likely w/ PE findings. Patient is oliguric/anuric making UA collection very difficult.  - refilled pain medication, asked to f/u in a few weeks - if pain persists or worsens >> imaging may be warranted 2/2 to risks of compression frxr (ESRD and osteoporosis)  Will also reassess Arm pain after surgery     Orders Placed This Encounter  Procedures  . Ambulatory referral to Orthopedic Surgery    Referral Priority:  Routine    Referral Type:  Surgical    Referral Reason:  Specialty  Services Required    Referred to Provider:  Suella Broad, MD    Requested Specialty:  Orthopedic Surgery    Number of Visits Requested:  1    Meds ordered this encounter  Medications  . oxyCODONE-acetaminophen (PERCOCET/ROXICET) 5-325 MG per tablet    Sig: Take 1 tablet by mouth daily as needed for severe pain.    Dispense:  60 tablet    Refill:  0     Elberta Leatherwood, MD,MS,  PGY2 08/12/2015 2:44 PM

## 2015-08-15 ENCOUNTER — Encounter (HOSPITAL_COMMUNITY): Payer: Self-pay | Admitting: Vascular Surgery

## 2015-08-16 ENCOUNTER — Telehealth: Payer: Self-pay | Admitting: Vascular Surgery

## 2015-08-16 ENCOUNTER — Ambulatory Visit: Payer: Commercial Managed Care - HMO | Admitting: Vascular Surgery

## 2015-08-16 NOTE — Telephone Encounter (Signed)
Unable to reach- no VM- mailed letter, dpm

## 2015-08-16 NOTE — Telephone Encounter (Signed)
-----   Message from Mena Goes, RN sent at 08/11/2015  4:22 PM EDT ----- Regarding: Schedule   ----- Message -----    From: Angelia Mould, MD    Sent: 08/11/2015  11:34 AM      To: [facility]vs Charge Pool Subject: charge and f/u                                 PROCEDURE: Banding of left basilic vein transposition  SURGEON: Judeth Cornfield. Scot Dock, MD, FACS  ASSIST: Izetta Dakin RNFA  She will need a follow up visit in approximately 2 weeks. Thank you. CD

## 2015-08-17 LAB — PROTIME-INR: INR: 1.4 — AB (ref 0.9–1.1)

## 2015-08-21 ENCOUNTER — Ambulatory Visit (INDEPENDENT_AMBULATORY_CARE_PROVIDER_SITE_OTHER): Payer: Commercial Managed Care - HMO | Admitting: Cardiovascular Disease

## 2015-08-21 DIAGNOSIS — Z5181 Encounter for therapeutic drug level monitoring: Secondary | ICD-10-CM

## 2015-08-21 DIAGNOSIS — I482 Chronic atrial fibrillation, unspecified: Secondary | ICD-10-CM

## 2015-08-28 ENCOUNTER — Encounter: Payer: Self-pay | Admitting: Vascular Surgery

## 2015-08-29 LAB — PROTIME-INR: INR: 3.8 — AB (ref 0.9–1.1)

## 2015-08-30 ENCOUNTER — Ambulatory Visit (INDEPENDENT_AMBULATORY_CARE_PROVIDER_SITE_OTHER): Payer: Commercial Managed Care - HMO | Admitting: Cardiology

## 2015-08-30 ENCOUNTER — Ambulatory Visit (INDEPENDENT_AMBULATORY_CARE_PROVIDER_SITE_OTHER): Payer: Commercial Managed Care - HMO | Admitting: Vascular Surgery

## 2015-08-30 ENCOUNTER — Encounter: Payer: Self-pay | Admitting: Vascular Surgery

## 2015-08-30 VITALS — BP 105/47 | HR 69 | Temp 98.1°F | Resp 16 | Ht 60.0 in | Wt 171.0 lb

## 2015-08-30 DIAGNOSIS — I482 Chronic atrial fibrillation, unspecified: Secondary | ICD-10-CM

## 2015-08-30 DIAGNOSIS — N186 End stage renal disease: Secondary | ICD-10-CM

## 2015-08-30 DIAGNOSIS — Z5181 Encounter for therapeutic drug level monitoring: Secondary | ICD-10-CM

## 2015-08-30 NOTE — Progress Notes (Signed)
Patient name: Kristi [name] MRN: [id number] DOB: 04-22-1941 Sex: female  REASON FOR VISIT: Follow up  HPI: Kristi [name] is a 74 y.o. 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 I used a PTFE band. This was performed on 08/11/15. She comes in for a 2 week follow up visit. She states that the symptoms in her left hand have resolved. They will begin trying her fistula again in 2 weeks. She has no specific complaints.  Current Outpatient Prescriptions  Medication Sig Dispense Refill  . ACCU-CHEK SOFTCLIX LANCETS lancets     . acetaminophen (TYLENOL) 500 MG tablet Take 1,000 mg by mouth every 6 (six) hours as needed.    Marland Kitchen albuterol (PROVENTIL HFA;VENTOLIN HFA) 108 (90 BASE) MCG/ACT inhaler Inhale into the lungs every 4 (four) hours as needed for wheezing.    Marland Kitchen allopurinol (ZYLOPRIM) 300 MG tablet Take 300 mg by mouth daily.  5  . Blood Glucose Monitoring Suppl (ACCU-CHEK AVIVA PLUS) W/DEVICE KIT     . Blood Glucose Monitoring Suppl (ACCU-CHEK AVIVA) device Use as instructed 1 each 0  . calcitRIOL (ROCALTROL) 0.25 MCG capsule Take 0.25 mcg by mouth 3 (three) times a week. Monday, Wednesday, Friday  5  . calcium acetate (PHOSLO) 667 MG capsule Take 667 mg by mouth 3 (three) times daily with meals.    Marland Kitchen CINNAMON PO Take 1,000 mg by mouth daily.    . ferrous sulfate 325 (65 FE) MG tablet Take 325 mg by mouth daily.    . fexofenadine (ALLEGRA) 180 MG tablet Take 180 mg by mouth daily as needed for allergies or rhinitis.    . furosemide (LASIX) 80 MG tablet Take 80 mg by mouth 3 (three) times daily as needed for fluid.    Marland Kitchen glipiZIDE (GLUCOTROL XL) 5 MG 24 hr tablet Take 5 mg by mouth every evening.    Marland Kitchen glucosamine-chondroitin 500-400 MG tablet Take 1 tablet by mouth 2 (two) times daily.      Marland Kitchen glucose blood (ACCU-CHEK AVIVA) test strip Use as instructed 100 each 12  . insulin glargine (LANTUS) 100 UNIT/ML injection Inject 0.13  mLs (13 Units total) into the skin every morning. If blood sugar is >140 add 1 unit. If blood sugar is <140 subtract 1 unit. 10 mL 2  . Lancet Devices (ACCU-CHEK SOFTCLIX) lancets Use as instructed 100 each 12  . Lancets Misc. (ACCU-CHEK SOFTCLIX LANCET DEV) KIT 1 strip by Does not apply route 3 (three) times daily. 1 kit 0  . metoprolol succinate (TOPROL-XL) 25 MG 24 hr tablet Take 0.5 tablets (12.5 mg total) by mouth daily. (Patient taking differently: Take 12.5 mg by mouth daily. Tuesday and Thursday ONLY) 90 tablet 3  . nitroGLYCERIN (NITROSTAT) 0.4 MG SL tablet Place 0.4 mg under the tongue every 5 (five) minutes as needed. For chest pain     Call 911 if pain not relieved by 3rd tab    . Omega-3 Fatty Acids (FISH OIL) 1200 MG CAPS Take 2 capsules by mouth 2 (two) times daily.    Marland Kitchen omeprazole (PRILOSEC) 20 MG capsule Take 1 capsule (20 mg total) by mouth at bedtime. 90 capsule 3  . oxyCODONE-acetaminophen (PERCOCET/ROXICET) 5-325 MG per tablet Take 1 tablet by mouth daily as needed for severe pain. 60 tablet 0  . Polyethyl Glycol-Propyl Glycol (SYSTANE OP) Apply 1-2 drops to eye 4 (four) times daily as needed. For dry eyes    .  pravastatin (PRAVACHOL) 40 MG tablet Take 40 mg by mouth daily.    . vitamin B-12 (CYANOCOBALAMIN) 1000 MCG tablet Take 1,000 mcg by mouth every morning.     . warfarin (COUMADIN) 5 MG tablet TAKE AS DIRECTED BY COUMADIN CLINIC 35 tablet 2  . mometasone-formoterol (DULERA) 100-5 MCG/ACT AERO Inhale 2 puffs into the lungs 2 (two) times daily as needed for shortness of breath.    . oxyCODONE-acetaminophen (ROXICET) 5-325 MG per tablet Take 1-2 tablets by mouth every 4 (four) hours as needed for severe pain. (Patient not taking: Reported on 08/30/2015) 30 tablet 0  . Tiotropium Bromide Monohydrate 2.5 MCG/ACT AERS Inhale 2 puffs into the lungs daily as needed (wheezing).     No current facility-administered medications for this visit.   REVIEW OF SYSTEMS: Valu.Nieves ] denotes  positive finding; [  ] denotes negative finding  CARDIOVASCULAR:  [ ]  chest pain   [ ]  dyspnea on exertion    CONSTITUTIONAL:  [ ]  fever   [ ]  chills  PHYSICAL EXAM: Filed Vitals:   08/30/15 1004  BP: 105/47  Pulse: 69  Temp: 98.1 F (36.7 C)  TempSrc: Oral  Resp: 16  Height: 5' (1.524 m)  Weight: 171 lb (77.565 kg)  SpO2: 99%   GENERAL: The patient is a well-nourished female, in no acute distress. The vital signs are documented above. CARDIOVASCULAR: There is a regular rate and rhythm. PULMONARY: There is good air exchange bilaterally without wheezing or rales. Her left hand is warm and well-perfused. She still has a good thrill in her left upper arm fistula.  MEDICAL ISSUES: STEAL SYNDROME LEFT UPPER EXTREMITY: The patient is done well status post banding of her left basilic vein transposition. I think they can begin using her fistula in 2 weeks. She will call if he having problems.  Deitra Mayo [facility]: [phone]

## 2015-09-01 ENCOUNTER — Encounter: Payer: Self-pay | Admitting: Family Medicine

## 2015-09-01 ENCOUNTER — Ambulatory Visit (INDEPENDENT_AMBULATORY_CARE_PROVIDER_SITE_OTHER): Payer: Commercial Managed Care - HMO | Admitting: Cardiology

## 2015-09-01 ENCOUNTER — Ambulatory Visit (INDEPENDENT_AMBULATORY_CARE_PROVIDER_SITE_OTHER): Payer: Commercial Managed Care - HMO | Admitting: Family Medicine

## 2015-09-01 VITALS — BP 86/38 | HR 72 | Temp 97.9°F | Ht 60.0 in | Wt 170.5 lb

## 2015-09-01 DIAGNOSIS — N189 Chronic kidney disease, unspecified: Secondary | ICD-10-CM

## 2015-09-01 DIAGNOSIS — M549 Dorsalgia, unspecified: Secondary | ICD-10-CM | POA: Diagnosis not present

## 2015-09-01 DIAGNOSIS — G8929 Other chronic pain: Secondary | ICD-10-CM | POA: Diagnosis not present

## 2015-09-01 DIAGNOSIS — E1122 Type 2 diabetes mellitus with diabetic chronic kidney disease: Secondary | ICD-10-CM

## 2015-09-01 DIAGNOSIS — I482 Chronic atrial fibrillation, unspecified: Secondary | ICD-10-CM

## 2015-09-01 DIAGNOSIS — Z5181 Encounter for therapeutic drug level monitoring: Secondary | ICD-10-CM

## 2015-09-01 LAB — POCT GLYCOSYLATED HEMOGLOBIN (HGB A1C): HEMOGLOBIN A1C: 5.9

## 2015-09-01 LAB — POCT INR: INR: 4.96

## 2015-09-01 MED ORDER — PREDNISONE 10 MG PO TABS: 10.0000 mg | ORAL_TABLET | Freq: Four times a day (QID) | ORAL | Status: DC

## 2015-09-01 NOTE — Progress Notes (Signed)
   HPI  CC: back pain Patient is here w/ some complaints of back pain. Pain is unchanged from prior exam. Still located to the lumbar spine. She denies any new injury. She states that her pain medication is not helping as much as before. No new numbness, weakness, paresthesias. She reports taking 2 of her oxycodone this AM which she has not dine in the past. She is looking for some other medication that might help control the pain better -- she asks about oxycontin. She says that she has an appt w/ ortho the first week of October and just needs enough to get to this appt.  ROS: denies weakness, newnumbness, falls, injury, weight loss, bowel or bladder incontinence, or saddle anesthesia.   Objective: BP 86/38 mmHg  Pulse 72  Temp(Src) 97.9 F (36.6 C) (Oral)  Ht 5' (1.524 m)  Wt 170 lb 8 oz (77.338 kg)  BMI 33.30 kg/m2 Gen: NAD, alert, cooperative, and pleasant. HEENT: NCAT, EOMI, Pupils pinpoint and minimally reactive to light. Ext: No edema, warm, FROM, strength/sensation intact. No bony deformities, no skin lesions, erythema, or ecchymosis. TTP of perispinal muscles ~L4/5. No tenderness of spinous process. Neg straight leg raise. Unable to perform stork test 2/2 baseline weakness. No CVA/suprapubic tenderness. Pulses present and cap refill present throughout.  Neuro: Alert and oriented, Speech clear, No gross deficits. DTRs symmetric   Assessment and plan:  Chronic back pain Patient's back pain continues to bother her. There have been no changes in this pain since the last visit (beginning of this month). PE is the same. Normally I would order XRs of her spine but patient has appt w/ ortho in ~2 wks and will likely have imaging there. Also, I do NOT feel comfortable providing her w/ anymore narcotics. Pupils are pinpoint today. - Prednisone 10mg  x21 tabs >> sheet provided to aid patient in proper dosing for steroid dose pack.   - DM2 has been well controlled recently, but we discussed  the changes this drug may cause.  Curb-sided Dr. Ree Kida during this visit.    Orders Placed This Encounter  Procedures  . POCT glycosylated hemoglobin (Hb A1C)    Meds ordered this encounter  Medications  . predniSONE (DELTASONE) 10 MG tablet    Sig: Take 1 tablet (10 mg total) by mouth taper from 4 doses each day to 1 dose and stop.    Dispense:  21 tablet    Refill:  0     Elberta Leatherwood, MD,MS,  PGY2 09/01/2015 8:58 PM

## 2015-09-01 NOTE — Patient Instructions (Signed)
It was a pleasure seeing you today in our clinic. Today we discussed your pain. Here is the treatment plan we have discussed and agreed upon together:   - I have prescribed you a Prednisone dose pack. I have provided you a print out of how I'd like you to take this medicine. My hope is that this will decrease any inflammation in your back and thus reduce the pain.

## 2015-09-01 NOTE — Assessment & Plan Note (Signed)
Patient's back pain continues to bother her. There have been no changes in this pain since the last visit (beginning of this month). PE is the same. Normally I would order XRs of her spine but patient has appt w/ ortho in ~2 wks and will likely have imaging there. Also, I do NOT feel comfortable providing her w/ anymore narcotics. Pupils are pinpoint today. - Prednisone 10mg  x21 tabs >> sheet provided to aid patient in proper dosing for steroid dose pack.   - DM2 has been well controlled recently, but we discussed the changes this drug may cause.  Curb-sided Dr. Ree Kida during this visit.

## 2015-09-04 ENCOUNTER — Encounter: Payer: Self-pay | Admitting: Family Medicine

## 2015-09-04 ENCOUNTER — Telehealth: Payer: Self-pay | Admitting: Interventional Cardiology

## 2015-09-04 NOTE — Telephone Encounter (Signed)
Spoke with Dr. Irish Lack and he states that pt does not require antibiotics prior to dental work and that pt does not need to hold Coumadin prior to teeth cleaning.  Dr. Irish Lack said in regards to an extraction, holding Coumadin would be based on bleeding risk.

## 2015-09-04 NOTE — Telephone Encounter (Signed)
New message     1. What dental office are you calling from? Dr. Kandis Mannan   2. What is your office phone and fax number? [phone]  3. What type of procedure is the patient having performed? Cleaning / pt in the chair now   4. What date is procedure scheduled? 9.26.2016   5. What is your question (ex. Antibiotics prior to procedure, holding medication-we need to know how long dentist wants pt to hold med)?  Does she need pre-med or does she need to stop coumadin

## 2015-09-05 LAB — PROTIME-INR
INR: 2.7 — AB (ref 0.9–1.1)
INR: 2.7 — AB (ref <=1.1)

## 2015-09-07 ENCOUNTER — Encounter: Payer: Self-pay | Admitting: Cardiology

## 2015-09-07 ENCOUNTER — Ambulatory Visit (INDEPENDENT_AMBULATORY_CARE_PROVIDER_SITE_OTHER): Payer: Commercial Managed Care - HMO | Admitting: Cardiology

## 2015-09-07 DIAGNOSIS — I482 Chronic atrial fibrillation, unspecified: Secondary | ICD-10-CM

## 2015-09-07 DIAGNOSIS — Z5181 Encounter for therapeutic drug level monitoring: Secondary | ICD-10-CM

## 2015-09-07 NOTE — Progress Notes (Signed)
This encounter was created in error - please disregard.

## 2015-09-08 ENCOUNTER — Other Ambulatory Visit: Payer: Self-pay | Admitting: Family Medicine

## 2015-09-15 ENCOUNTER — Other Ambulatory Visit: Payer: Self-pay | Admitting: Family Medicine

## 2015-09-19 LAB — PROTIME-INR: INR: 1.2 — AB (ref 0.9–1.1)

## 2015-09-22 ENCOUNTER — Ambulatory Visit (INDEPENDENT_AMBULATORY_CARE_PROVIDER_SITE_OTHER): Payer: Commercial Managed Care - HMO | Admitting: Cardiology

## 2015-09-22 DIAGNOSIS — Z5181 Encounter for therapeutic drug level monitoring: Secondary | ICD-10-CM

## 2015-09-22 DIAGNOSIS — I482 Chronic atrial fibrillation, unspecified: Secondary | ICD-10-CM

## 2015-09-27 ENCOUNTER — Ambulatory Visit (INDEPENDENT_AMBULATORY_CARE_PROVIDER_SITE_OTHER): Payer: Commercial Managed Care - HMO | Admitting: *Deleted

## 2015-09-27 ENCOUNTER — Other Ambulatory Visit: Payer: Self-pay | Admitting: Nephrology

## 2015-09-27 ENCOUNTER — Ambulatory Visit (HOSPITAL_COMMUNITY): Payer: Commercial Managed Care - HMO | Attending: Cardiology

## 2015-09-27 ENCOUNTER — Ambulatory Visit (HOSPITAL_COMMUNITY): Payer: Commercial Managed Care - HMO

## 2015-09-27 ENCOUNTER — Other Ambulatory Visit: Payer: Self-pay

## 2015-09-27 DIAGNOSIS — I10 Essential (primary) hypertension: Secondary | ICD-10-CM | POA: Insufficient documentation

## 2015-09-27 DIAGNOSIS — I482 Chronic atrial fibrillation, unspecified: Secondary | ICD-10-CM

## 2015-09-27 DIAGNOSIS — I272 Other secondary pulmonary hypertension: Secondary | ICD-10-CM | POA: Diagnosis not present

## 2015-09-27 DIAGNOSIS — I953 Hypotension of hemodialysis: Secondary | ICD-10-CM

## 2015-09-27 DIAGNOSIS — I959 Hypotension, unspecified: Secondary | ICD-10-CM | POA: Insufficient documentation

## 2015-09-27 DIAGNOSIS — E119 Type 2 diabetes mellitus without complications: Secondary | ICD-10-CM | POA: Diagnosis not present

## 2015-09-27 DIAGNOSIS — I517 Cardiomegaly: Secondary | ICD-10-CM | POA: Diagnosis not present

## 2015-09-27 DIAGNOSIS — Z5181 Encounter for therapeutic drug level monitoring: Secondary | ICD-10-CM | POA: Diagnosis not present

## 2015-09-27 LAB — POCT INR: INR: 4.1

## 2015-10-03 ENCOUNTER — Telehealth: Payer: Self-pay | Admitting: Family Medicine

## 2015-10-03 NOTE — Telephone Encounter (Signed)
Spoke with patient's daughter regarding patient's low blood sugar. Advise her that patient should be seen in clinic for DM management.  Offered an appointment with Dr. Valentina Lucks; appointment scheduled for Friday at 10:45 AM.  Advised daughter on what steps to take if blood sugar is low and if patient is unresponsive to always call 911.  Daughter stated understanding.  Will forward to PCP.  Derl Barrow, RN'

## 2015-10-03 NOTE — Telephone Encounter (Signed)
Pt daughter, Pamala Hurry, is calling to report that this pt had trouble waking up this morning. 911 was called and her blood sugar was at 34. She states that the EMS Team helped her to return to normal and advised her to keep her dialysis appointment. Be that as it may, Pamala Hurry would like to know what further steps to take regarding this matter. She states that anytime is OK to reach her at the number provided. Thank you, Fonda Kinder, ASA

## 2015-10-04 ENCOUNTER — Ambulatory Visit (INDEPENDENT_AMBULATORY_CARE_PROVIDER_SITE_OTHER): Payer: Commercial Managed Care - HMO | Admitting: Interventional Cardiology

## 2015-10-04 ENCOUNTER — Encounter: Payer: Self-pay | Admitting: Interventional Cardiology

## 2015-10-04 VITALS — BP 104/50 | HR 77 | Ht 60.0 in | Wt 170.4 lb

## 2015-10-04 DIAGNOSIS — I2510 Atherosclerotic heart disease of native coronary artery without angina pectoris: Secondary | ICD-10-CM

## 2015-10-04 DIAGNOSIS — E119 Type 2 diabetes mellitus without complications: Secondary | ICD-10-CM

## 2015-10-04 DIAGNOSIS — Z992 Dependence on renal dialysis: Secondary | ICD-10-CM

## 2015-10-04 DIAGNOSIS — N186 End stage renal disease: Secondary | ICD-10-CM | POA: Diagnosis not present

## 2015-10-04 DIAGNOSIS — I482 Chronic atrial fibrillation, unspecified: Secondary | ICD-10-CM

## 2015-10-04 DIAGNOSIS — Z794 Long term (current) use of insulin: Secondary | ICD-10-CM

## 2015-10-04 NOTE — Progress Notes (Signed)
Patient ID: Kristi [name], female   DOB: 1941-08-10, 74 y.o.   MRN: [id number]     Cardiology Office Note   Date:  10/04/2015   ID:  Ilene, Witcher [birth year], MRN [id number]  PCP:  Georges Lynch, MD    No chief complaint on file. f/u CAD   Wt Readings from Last 3 Encounters:  10/04/15 170 lb 6.4 oz (77.293 kg)  09/01/15 170 lb 8 oz (77.338 kg)  08/30/15 171 lb (77.565 kg)       History of Present Illness: Kristi [name] is a 74 y.o. female  who had an inferior MI in 06/2007. She has renal insufficiency and started dialysis T, Th, Sa. No chest pain or SHOB. Little walking at this point; no SHOB with supplemental oxygen. She denies: Nitroglycerin use, Orthopnea, Palpitations, Syncope.   She has lost significant weight since starting dialysis.  She is not eating all that carefully.    She had severe hypoglycemia yesterday and was treated by EMS, she was treated at home and not taken to the ER.  Today, she is feeling better.   She lives alone but her daughters keep a close check on her.     Past Medical History  Diagnosis Date  . Hyperlipidemia   . Hyperparathyroidism   . CAD (coronary artery disease)   . Myocardial infarction 2008  . Hypertension   . COPD (chronic obstructive pulmonary disease)   . Diabetes mellitus     diagnosed 2010 - takes oral meds and lantus insulin  . Anemia   . Chronic kidney disease     esrd - since 2010  . GERD (gastroesophageal reflux disease)     on omeprazole  . Arthritis     all over  . CHF (congestive heart failure) 08-2011  . CVA 02/19/2010  . CAD 02/19/2010  . DEEP VENOUS THROMBOPHLEBITIS, LEG, LEFT 03/08/2010    in 2009 s/p hospitalization- only 1 isolated dvt  . Pulmonary artery hypertension 10/31/2014  . Shortness of breath dyspnea     with exertion  . Depression     years ago  . Dry skin   . Headache   . Dysrhythmia     atrial fibrillation    Past Surgical History  Procedure Laterality Date  . Av fistula placement   04/05/11    Left Radiocephalic AVF  . Appendectomy  1969  . Tubal ligation    . Revision of fisturl    . Coronary angioplasty with stent placement  06/23/2007    by Dr. Irish Lack is her cardiologist  . Ligation goretex fistula  10/29/2011    Procedure: LIGATION GORE-TEX FISTULA;  Surgeon: Elam Dutch, MD;  Location: [facility] OR;  Service: Vascular;  Laterality: Left;  Ligation of competing branches left forearm fistula  . Fistulogram N/A 02/17/2012    Procedure: FISTULOGRAM;  Surgeon: Angelia Mould, MD;  Location: [facility] CATH LAB;  Service: Cardiovascular;  Laterality: N/A;  . Insertion of dialysis catheter N/A 01/12/2015    Procedure: INSERTION OF DIALYSIS CATHETER;  Surgeon: Conrad Crossville, MD;  Location: [place];  Service: Vascular;  Laterality: N/A;  . Esophagogastroduodenoscopy N/A 01/20/2015    Procedure: ESOPHAGOGASTRODUODENOSCOPY (EGD);  Surgeon: Wonda Horner, MD;  Location: [facility] ENDOSCOPY;  Service: Endoscopy;  Laterality: N/A;  . Eye surgery Bilateral     cataract surgery with lens implants  . Tonsillectomy    . Bascilic vein transposition Left 03/24/2015    Procedure: LEFT ARM BASILIC VEIN TRANSPOSITION;  Surgeon: Angelia Mould, MD;  Location: [facility] OR;  Service: Vascular;  Laterality: Left;  . Ligation of arteriovenous  fistula Left 03/24/2015    Procedure: LIGATION OF left arm radio-cephalic ARTERIOVENOUS  FISTULA;  Surgeon: Angelia Mould, MD;  Location: [place];  Service: Vascular;  Laterality: Left;  . Revison of arteriovenous fistula Left 12/15/6158    Procedure: PLICATION OF BASILIC VEIN TRANSPOSITION FISTULA;  Surgeon: Angelia Mould, MD;  Location: [place];  Service: Vascular;  Laterality: Left;     Current Outpatient Prescriptions  Medication Sig Dispense Refill  . ACCU-CHEK SOFTCLIX LANCETS lancets     . acetaminophen (TYLENOL) 500 MG tablet Take 1,000 mg by mouth every 6 (six) hours as needed.    Marland Kitchen albuterol (PROVENTIL HFA;VENTOLIN HFA) 108 (90 BASE) MCG/ACT  inhaler Inhale into the lungs every 4 (four) hours as needed for wheezing.    Marland Kitchen allopurinol (ZYLOPRIM) 300 MG tablet Take 300 mg by mouth daily.  5  . Blood Glucose Monitoring Suppl (ACCU-CHEK AVIVA PLUS) W/DEVICE KIT     . Blood Glucose Monitoring Suppl (ACCU-CHEK AVIVA) device Use as instructed 1 each 0  . calcitRIOL (ROCALTROL) 0.25 MCG capsule Take 0.25 mcg by mouth 3 (three) times a week. Monday, Wednesday, Friday  5  . calcium acetate (PHOSLO) 667 MG capsule Take 667 mg by mouth 3 (three) times daily with meals.    Marland Kitchen CINNAMON PO Take 1,000 mg by mouth daily.    . ferrous sulfate 325 (65 FE) MG tablet Take 325 mg by mouth daily.    . fexofenadine (ALLEGRA) 180 MG tablet Take 180 mg by mouth daily as needed for allergies or rhinitis.    . furosemide (LASIX) 80 MG tablet Take 80 mg by mouth 3 (three) times daily as needed for fluid.    Marland Kitchen glipiZIDE (GLUCOTROL XL) 5 MG 24 hr tablet TAKE 1 TABLET (5 MG TOTAL) BY MOUTH DAILY WITH BREAKFAST. 90 tablet 11  . glucosamine-chondroitin 500-400 MG tablet Take 1 tablet by mouth 2 (two) times daily.      Marland Kitchen glucose blood (ACCU-CHEK AVIVA) test strip Use as instructed 100 each 12  . insulin glargine (LANTUS) 100 UNIT/ML injection Inject 0.13 mLs (13 Units total) into the skin every morning. If blood sugar is >140 add 1 unit. If blood sugar is <140 subtract 1 unit. 10 mL 2  . Lancet Devices (ACCU-CHEK SOFTCLIX) lancets Use as instructed 100 each 12  . Lancets Misc. (ACCU-CHEK SOFTCLIX LANCET DEV) KIT 1 strip by Does not apply route 3 (three) times daily. 1 kit 0  . metoprolol succinate (TOPROL-XL) 25 MG 24 hr tablet Take 12.5 mg by mouth 2 (two) times a week.    . mometasone-formoterol (DULERA) 100-5 MCG/ACT AERO Inhale 2 puffs into the lungs 2 (two) times daily as needed for shortness of breath.    . nitroGLYCERIN (NITROSTAT) 0.4 MG SL tablet Place 0.4 mg under the tongue every 5 (five) minutes as needed. For chest pain     Call 911 if pain not relieved by 3rd  tab    . Omega-3 Fatty Acids (FISH OIL) 1200 MG CAPS Take 2 capsules by mouth 2 (two) times daily.    Marland Kitchen omeprazole (PRILOSEC) 20 MG capsule Take 1 capsule (20 mg total) by mouth at bedtime. 90 capsule 3  . oxyCODONE-acetaminophen (ROXICET) 5-325 MG per tablet Take 1-2 tablets by mouth every 4 (four) hours as needed for severe pain. 30 tablet 0  . Polyethyl Glycol-Propyl Glycol (  SYSTANE OP) Apply 1-2 drops to eye 4 (four) times daily as needed. For dry eyes    . pravastatin (PRAVACHOL) 40 MG tablet TAKE 1 TABLET BY MOUTH EVERY DAY 90 tablet 3  . Tiotropium Bromide Monohydrate 2.5 MCG/ACT AERS Inhale 2 puffs into the lungs daily as needed (wheezing).    . vitamin B-12 (CYANOCOBALAMIN) 1000 MCG tablet Take 1,000 mcg by mouth every morning.     . warfarin (COUMADIN) 5 MG tablet TAKE AS DIRECTED BY COUMADIN CLINIC 35 tablet 2   No current facility-administered medications for this visit.    Allergies:   Losartan and Chantix    Social History:  The patient  reports that she quit smoking about 18 months ago. Her smoking use included Cigarettes. She started smoking about 54 years ago. She has a 26.5 pack-year smoking history. She has never used smokeless tobacco. She reports that she drinks alcohol. She reports that she does not use illicit drugs.   Family History:  The patient's family history includes Anuerysm in her father; Cancer in her mother; Dementia in her father; Pneumonia in her father.    ROS:  Please see the history of present illness.   Otherwise, review of systems are positive for recent hypoglycemia.   All other systems are reviewed and negative.    PHYSICAL EXAM: VS:  BP 104/50 mmHg  Pulse 77  Ht 5' (1.524 m)  Wt 170 lb 6.4 oz (77.293 kg)  BMI 33.28 kg/m2  SpO2 96% , BMI Body mass index is 33.28 kg/(m^2). GEN: Well nourished, well developed, in no acute distress HEENT: normal Neck: no JVD, carotid bruits, or masses Cardiac: premature beats, RRR; soft systolic  Murmurs, no  rubs, or gallops,no edema  Respiratory:  clear to auscultation bilaterally, normal work of breathing GI: soft, nontender, nondistended, + BS MS: no deformity or atrophy Skin: warm and dry, no rash Neuro:  Strength and sensation are intact Psych: euthymic mood, full affect     Labs: 10/28/2014: Pro B Natriuretic peptide (BNP) 12267.0* 01/10/2015: B Natriuretic Peptide 2273.8* 01/11/2015: Magnesium 2.3 01/13/2015: ALT 7 01/14/2015: TSH 1.235 01/25/2015: BUN 11; Creatinine, Ser 2.23*; Platelets 128* 08/11/2015: Hemoglobin 12.6; Potassium 3.5; Sodium 136   Lipid Panel    Component Value Date/Time   CHOL 157 09/30/2014 0827   CHOL 164 04/05/2013   TRIG 166* 09/30/2014 0827   TRIG 39 04/05/2013   HDL 32* 09/30/2014 0827   CHOLHDL 4.9 09/30/2014 0827   VLDL 33 09/30/2014 0827   LDLCALC 92 09/30/2014 0827   LDLCALC 98 04/05/2013     Other studies Reviewed: Additional studies/ records that were reviewed today with results demonstrating: RCA stent placed in 2007 at the time of inferior STEMI.   ASSESSMENT AND PLAN:  1. CAD/Old MI: Continue aggressive secondary prevention. No angina. 2. AFib: Sounds like she is in NSR.  Warfarin for stroke prevention.  3. DM: Managed by PMD.  Recent hypoglemia due to falling asleep before eating her usual dinner.  4. ESRD: Dialysis 3x/week.   Current medicines are reviewed at length with the patient today.  The patient concerns regarding her medicines were addressed.  The following changes have been made:  No change  Labs/ tests ordered today include:  No orders of the defined types were placed in this encounter.    Recommend 150 minutes/week of aerobic exercise Low fat, low carb, high fiber diet recommended  Disposition:   FU in 6 months   Signed, Jettie Booze., MD  10/04/2015 12:02  PM    [facility] [place], [place], [place]  [address] Phone: [phone]; Fax: [phone]

## 2015-10-04 NOTE — Patient Instructions (Signed)
Medication Instructions:  Same-no changes  Labwork: None  Testing/Procedures: None  Follow-Up: Your physician wants you to follow-up in: 9 months. You will receive a reminder letter in the mail two months in advance. If you don't receive a letter, please call our office to schedule the follow-up appointment.     If you need a refill on your cardiac medications before your next appointment, please call your pharmacy.   

## 2015-10-05 LAB — PROTIME-INR: INR: 3.4 — AB (ref 0.9–1.1)

## 2015-10-06 ENCOUNTER — Ambulatory Visit (INDEPENDENT_AMBULATORY_CARE_PROVIDER_SITE_OTHER): Payer: Commercial Managed Care - HMO | Admitting: Pharmacist

## 2015-10-06 ENCOUNTER — Encounter: Payer: Self-pay | Admitting: Pharmacist

## 2015-10-06 VITALS — BP 104/42 | HR 65 | Wt 169.0 lb

## 2015-10-06 DIAGNOSIS — E119 Type 2 diabetes mellitus without complications: Secondary | ICD-10-CM

## 2015-10-06 DIAGNOSIS — Z87891 Personal history of nicotine dependence: Secondary | ICD-10-CM | POA: Diagnosis not present

## 2015-10-06 DIAGNOSIS — J449 Chronic obstructive pulmonary disease, unspecified: Secondary | ICD-10-CM

## 2015-10-06 DIAGNOSIS — M79605 Pain in left leg: Secondary | ICD-10-CM

## 2015-10-06 DIAGNOSIS — Z794 Long term (current) use of insulin: Secondary | ICD-10-CM

## 2015-10-06 MED ORDER — TIOTROPIUM BROMIDE MONOHYDRATE 2.5 MCG/ACT IN AERS: 2.0000 | INHALATION_SPRAY | Freq: Every day | RESPIRATORY_TRACT | Status: AC

## 2015-10-06 NOTE — Assessment & Plan Note (Signed)
Leg Pain New- severe, uncontrolled.  Patient describes severe pain with little pressure.  Currently taking up to 3g of acetaminophen daily (4-6 500 mg tablets).  Out of Oxycodone- Acetaminophen and unable to take.  The combination of these agents should be addressed for concerns about liver toxicity.  Leg pain assessed by Dr. Lindell Noe who recommended further evaluation with PCP.

## 2015-10-06 NOTE — Assessment & Plan Note (Addendum)
COPD moderately controlled with rare use of bronchodilators.  She describes symptoms consistent with MMRC score of 2.  Patient advised to start taking Tiotropium everyday.  If symptoms do not improve, she will need to restart daily use of Dulera as well.

## 2015-10-06 NOTE — Patient Instructions (Signed)
It was great to see you today!   Take your Glipizide XR 5 mg once daily with lunch.  You can stop taking your Lantus everyday.  If you see a number above 200, give yourself 5 units of Lantus.  Start taking your Tiotropium 2.5 mcg 2 puffs everyday.  Stop taking the Glucosamine-Chondroitin.  Follow up with Dr. Alease Frame in one week.

## 2015-10-06 NOTE — Assessment & Plan Note (Signed)
Diabetes currently well-controlled.   Patient reports hypoglycemic events and is able to verbalize appropriate hypoglycemia management plan.  Patient reports adherence with medication. Control is supraoptimal due to weight loss, controlled diet and excessive Lantus dose.  Stopped basal insulin Lantus (insulin glargine) for daily use due to recent episode of low reading requiring EMS and most recent A1c. .  Instructed patient to check blood sugars daily.  If blood sugar is greater than 200, she should give herself 5 units of Lantus. Patient was taking Glipizide XR at bedtime, possibly contributing to her low glucose readings.  Counseled patient to take Glipizide XR 5 mg once daily with lunch.  She will continue to check blood sugars daily and will call the clinic if numbers remain consistently high or low.  Next A1C anticipated in 5-6 months.

## 2015-10-06 NOTE — Progress Notes (Signed)
S:    Patient arrives in good spirits, walking slowly with a can.  Presents for diabetes follow up after severely low blood sugars.  Patient reports adherence with medications. Current diabetes medications include Lantus and Glipizide.  Patient reports hypoglycemic events, including being awoke by EMS and neighbor in a state of confusion.  Blood sugar reading that morning was 34.  Patient denies nocturia.  Patient denies neuropathy. Patient denies visual changes. Patient reports self foot exams.   Patient described an intense leg pain on her shins.  Seen by MD.   Patient reports lightheadedness in the morning when she first gets up.  Denies symptoms throughout the day. Patient endorses occassional use of COPD inhalers.  She reports shortness of breath with moderate activities.  She denies having to catch her breath walking by herself, but does report stopping when walking with friends her age.    O:  Lab Results  Component Value Date   HGBA1C 5.9 09/01/2015   Home fasting CBG: Typically between 70-100, some excursions up to 160.  Several notable lows below 50.  In-Clinic BP: 104/42  A/P: Diabetes currently well-controlled.   Patient reports hypoglycemic events and is able to verbalize appropriate hypoglycemia management plan.  Patient reports adherence with medication. Control is supraoptimal due to weight loss, controlled diet and excessive Lantus dose.  Stopped basal insulin Lantus (insulin glargine) for daily use due to recent episode of low reading requiring EMS and most recent A1c. .  Instructed patient to check blood sugars daily.  If blood sugar is greater than 200, she should give herself 5 units of Lantus. Patient was taking Glipizide XR at bedtime, possibly contributing to her low glucose readings.  Counseled patient to take Glipizide XR 5 mg once daily with lunch.  She will continue to check blood sugars daily and will call the clinic if numbers remain consistently high or low.   Next A1C anticipated in 5-6 months.    COPD moderately controlled with rare use of bronchodilators.  She describes symptoms consistent with MMRC score of 2.  Patient advised to start taking Tiotropium everyday.  If symptoms do not improve, she will need to restart daily use of Dulera as well.  Osteoarthritis poorly controlled.  Patient has been taking Glucosamine-Chondroitin for 2 years with no benefit.  Advised patient to stop taking this medication.  Leg Pain New- severe, uncontrolled.  Patient describes severe pain with little pressure.  Currently taking up to 3g of acetaminophen daily (4-6 500 mg tablets).  Out of Oxycodone- Acetaminophen and unable to take.  The combination of these agents should be addressed for concerns about liver toxicity.  Leg pain assessed by Dr. Lindell Noe who recommended further evaluation with PCP.  Written patient instructions provided.  Total time in face to face counseling 30 minutes.   Follow up with Dr. Alease Frame in 7-10 days.   Patient seen with Viann Fish, PharmD candidate.

## 2015-10-06 NOTE — Assessment & Plan Note (Addendum)
History of Tobacco Abuse.  Quit Smoking for > 6 months. Encouraged continued abstinence.

## 2015-10-09 ENCOUNTER — Ambulatory Visit (INDEPENDENT_AMBULATORY_CARE_PROVIDER_SITE_OTHER): Payer: Commercial Managed Care - HMO | Admitting: Cardiology

## 2015-10-09 DIAGNOSIS — Z5181 Encounter for therapeutic drug level monitoring: Secondary | ICD-10-CM

## 2015-10-09 DIAGNOSIS — I482 Chronic atrial fibrillation, unspecified: Secondary | ICD-10-CM

## 2015-10-09 NOTE — Progress Notes (Signed)
Patient ID: Kristi [name], female   DOB: 12-22-1940, 74 y.o.   MRN: [id number] Reviewed: Agree with Dr. Graylin Shiver documentation and management.

## 2015-10-16 ENCOUNTER — Ambulatory Visit (INDEPENDENT_AMBULATORY_CARE_PROVIDER_SITE_OTHER): Payer: Commercial Managed Care - HMO | Admitting: Family Medicine

## 2015-10-16 ENCOUNTER — Encounter: Payer: Self-pay | Admitting: Family Medicine

## 2015-10-16 VITALS — BP 118/54 | HR 91 | Temp 98.6°F | Ht 60.0 in | Wt 176.0 lb

## 2015-10-16 DIAGNOSIS — Z992 Dependence on renal dialysis: Secondary | ICD-10-CM

## 2015-10-16 DIAGNOSIS — E1122 Type 2 diabetes mellitus with diabetic chronic kidney disease: Secondary | ICD-10-CM

## 2015-10-16 DIAGNOSIS — N186 End stage renal disease: Secondary | ICD-10-CM | POA: Diagnosis not present

## 2015-10-16 NOTE — Assessment & Plan Note (Addendum)
Recent hypoglycemia likely related to increased half life of insulin with CKD - improved safety off insulin

## 2015-10-16 NOTE — Progress Notes (Signed)
   Subjective:   Kristi [name] is a 74 y.o. female with a history of diabetes, ESRD, COPD, CHF here for DM f/u   Per patient and pharm note, insulin stopped last week after severe hypoglycemia to 68 with unresponsiveness and EMS call. Also move evening glipizide to lunch to avoid overnight lows. Patient reports doing well with this. Has been waking up at 3am to check sugar and getting worried about 80s so she drinks juice to bring it up so it doesn't drop more before breakfast.   Review of Systems:  Per HPI. All other systems reviewed and are negative.   PMH, PSH, Medications, Allergies, and FmHx reviewed and updated in EMR.  Social History: former smoker  Objective:  BP 118/54 mmHg  Pulse 91  Temp(Src) 98.6 F (37 C) (Oral)  Ht 5' (1.524 m)  Wt 176 lb (79.833 kg)  BMI 34.37 kg/m2  Gen:  74 y.o. female in NAD HEENT: NCAT, MMM, EOMI, PERRL, anicteric sclerae CV: RRR, no MRG, no JVD Resp: Non-labored, CTAB, no wheezes noted Abd: Soft, NTND, BS present, no guarding or organomegaly Ext: WWP, no edema MSK: Full ROM, strength intact Neuro: Alert and oriented, speech normal      Chemistry      Component Value Date/Time   NA 136 08/11/2015 0745   NA 141 08/25/2012 1033   K 3.5 08/11/2015 0745   K 4.3 08/25/2012 1033   CL 101 01/25/2015 0513   CL 108* 08/25/2012 1033   CO2 27 01/25/2015 0513   CO2 19* 08/25/2012 1033   BUN 11 01/25/2015 0513   BUN 68.0* 08/25/2012 1033   CREATININE 2.23* 01/25/2015 0513   CREATININE 2.61* 11/09/2014 0908   CREATININE 2.8* 05/10/2014   CREATININE 2.9* 08/25/2012 1033   GLU 169 05/10/2014      Component Value Date/Time   CALCIUM 8.9 01/25/2015 0513   CALCIUM 9.5 08/25/2012 1033   CALCIUM 9.9 03/29/2010 0000   ALKPHOS 73 01/13/2015 1130   ALKPHOS 86 08/25/2012 1033   AST 19 01/13/2015 1130   AST 13 08/25/2012 1033   ALT 7 01/13/2015 1130   ALT 11 08/25/2012 1033   BILITOT 0.9 01/13/2015 1130   BILITOT 0.40 08/25/2012 1033       Lab Results  Component Value Date   WBC 3.5* 01/25/2015   HGB 12.6 08/11/2015   HCT 37.0 08/11/2015   MCV 92.2 01/25/2015   PLT 128* 01/25/2015   Lab Results  Component Value Date   TSH 1.235 01/14/2015   Lab Results  Component Value Date   HGBA1C 5.9 09/01/2015   Assessment & Plan:     Kristi [name] is a 74 y.o. female here for diabetes  Type 2 diabetes mellitus with renal manifestations, controlled ([place]) Last A1c 5.9, insulin stopped for severe hypoglycemia to 34, CBGs 80-250 since then, mostly 110-170. - continue glipizide daily with lunch - eye exam scheduled later this week - foot exam at dialysis last week - f/u w/ pcp in 1 month  ESRD needing dialysis Recent hypoglycemia likely related to increased half life of insulin with CKD - improved safety off insulin        Beverlyn Roux, MD, MPH [facility] PGY-3 10/18/2015 1:57 PM

## 2015-10-16 NOTE — Assessment & Plan Note (Signed)
Last A1c 5.9, insulin stopped for severe hypoglycemia to 34, CBGs 80-250 since then, mostly 110-170. - continue glipizide daily with lunch - eye exam scheduled later this week - foot exam at dialysis last week - f/u w/ pcp in 1 month

## 2015-10-16 NOTE — Patient Instructions (Signed)
I am very pleased with your blood sugars since stopping insulin. Please continue taking your glipizide with lunch and checking blood sugars first thing in the morning and after meals. Please follow-up with Dr. Alease Frame next month and call us if you have any problems before then. For your overnight and early morning blood sugars the 80s are not too low and you should feel safe not waking up at 3am now that you are off insulin.

## 2015-10-18 ENCOUNTER — Ambulatory Visit (INDEPENDENT_AMBULATORY_CARE_PROVIDER_SITE_OTHER): Payer: Commercial Managed Care - HMO | Admitting: Ophthalmology

## 2015-10-18 DIAGNOSIS — E113391 Type 2 diabetes mellitus with moderate nonproliferative diabetic retinopathy without macular edema, right eye: Secondary | ICD-10-CM

## 2015-10-18 DIAGNOSIS — I10 Essential (primary) hypertension: Secondary | ICD-10-CM | POA: Diagnosis not present

## 2015-10-18 DIAGNOSIS — H43813 Vitreous degeneration, bilateral: Secondary | ICD-10-CM

## 2015-10-18 DIAGNOSIS — H35033 Hypertensive retinopathy, bilateral: Secondary | ICD-10-CM

## 2015-10-18 DIAGNOSIS — E11319 Type 2 diabetes mellitus with unspecified diabetic retinopathy without macular edema: Secondary | ICD-10-CM | POA: Diagnosis not present

## 2015-10-18 DIAGNOSIS — E113592 Type 2 diabetes mellitus with proliferative diabetic retinopathy without macular edema, left eye: Secondary | ICD-10-CM | POA: Diagnosis not present

## 2015-10-24 LAB — PROTIME-INR: INR: 4.6 — AB (ref 0.9–1.1)

## 2015-10-26 ENCOUNTER — Ambulatory Visit (INDEPENDENT_AMBULATORY_CARE_PROVIDER_SITE_OTHER): Payer: Commercial Managed Care - HMO | Admitting: Cardiovascular Disease

## 2015-10-26 DIAGNOSIS — I482 Chronic atrial fibrillation, unspecified: Secondary | ICD-10-CM

## 2015-10-26 DIAGNOSIS — Z5181 Encounter for therapeutic drug level monitoring: Secondary | ICD-10-CM

## 2015-10-31 LAB — PROTIME-INR: INR: 1.9 — AB (ref 0.9–1.1)

## 2015-11-01 ENCOUNTER — Ambulatory Visit (INDEPENDENT_AMBULATORY_CARE_PROVIDER_SITE_OTHER): Payer: Commercial Managed Care - HMO | Admitting: Cardiology

## 2015-11-01 DIAGNOSIS — I482 Chronic atrial fibrillation, unspecified: Secondary | ICD-10-CM

## 2015-11-01 DIAGNOSIS — Z5181 Encounter for therapeutic drug level monitoring: Secondary | ICD-10-CM

## 2015-11-07 LAB — PROTIME-INR: INR: 2.9 — AB (ref 0.9–1.1)

## 2015-11-10 ENCOUNTER — Other Ambulatory Visit: Payer: Self-pay | Admitting: Interventional Cardiology

## 2015-11-10 ENCOUNTER — Ambulatory Visit (INDEPENDENT_AMBULATORY_CARE_PROVIDER_SITE_OTHER): Payer: Commercial Managed Care - HMO | Admitting: Cardiovascular Disease

## 2015-11-10 DIAGNOSIS — I482 Chronic atrial fibrillation, unspecified: Secondary | ICD-10-CM

## 2015-11-10 DIAGNOSIS — Z5181 Encounter for therapeutic drug level monitoring: Secondary | ICD-10-CM

## 2015-11-20 ENCOUNTER — Ambulatory Visit (INDEPENDENT_AMBULATORY_CARE_PROVIDER_SITE_OTHER): Payer: Commercial Managed Care - HMO | Admitting: Family Medicine

## 2015-11-20 ENCOUNTER — Encounter: Payer: Self-pay | Admitting: Family Medicine

## 2015-11-20 VITALS — BP 127/46 | HR 79 | Temp 97.7°F | Ht 60.0 in | Wt 171.1 lb

## 2015-11-20 DIAGNOSIS — E1122 Type 2 diabetes mellitus with diabetic chronic kidney disease: Secondary | ICD-10-CM

## 2015-11-20 DIAGNOSIS — E11319 Type 2 diabetes mellitus with unspecified diabetic retinopathy without macular edema: Secondary | ICD-10-CM | POA: Diagnosis not present

## 2015-11-20 DIAGNOSIS — N186 End stage renal disease: Secondary | ICD-10-CM | POA: Diagnosis not present

## 2015-11-20 DIAGNOSIS — M79605 Pain in left leg: Secondary | ICD-10-CM

## 2015-11-20 DIAGNOSIS — M79604 Pain in right leg: Secondary | ICD-10-CM

## 2015-11-20 DIAGNOSIS — R600 Localized edema: Secondary | ICD-10-CM

## 2015-11-20 DIAGNOSIS — Z992 Dependence on renal dialysis: Secondary | ICD-10-CM | POA: Diagnosis not present

## 2015-11-20 LAB — POCT INR: INR: 3.2

## 2015-11-20 LAB — POCT GLYCOSYLATED HEMOGLOBIN (HGB A1C): HEMOGLOBIN A1C: 5.4

## 2015-11-20 MED ORDER — INSULIN GLARGINE 100 UNIT/ML ~~LOC~~ SOLN: 5.0000 [IU] | SUBCUTANEOUS | Status: DC

## 2015-11-20 NOTE — Progress Notes (Signed)
   HPI  CC: patient is here for a DM followup Recently had a hypoglycemic event. Was asked to stop Lantus due to this event. She states that since that time her CBGs have been creeping up >> she ended up taking 70units of Lantus on 12/10 b/c her CBG was >544 (meter wouldn't read any higher). The following day her CBGs were in the 200s. No current symptoms other than excessive thirst (is oliguric from ESRD). Is worried that her DM is getting out of control w/o the Lantus. Otherwise, has good medication compliance.  Leg pain: Has extreme leg tenderness. Started about 4 weeks ago. Left started first but now Bilateral. L>R. Tender to the touch. Sometimes red in color. No increase in pain w/ walking. Also c/o back pain. Seeing ortho for spinal injections. No weakness/numbness. No falls.   ESRD: HD 3x a week. Going well. Getting more and more use to the process. Has some concern about her fistula but no other issues.  ROS: denies HA, lightheadedness, SOB, CP, n/v/d, abdominal pain, numbness, weakness, falls.  Previous smoker  Objective: BP 127/46 mmHg  Pulse 79  Temp(Src) 97.7 F (36.5 C) (Oral)  Ht 5' (1.524 m)  Wt 171 lb 1.6 oz (77.61 kg)  BMI 33.42 kg/m2 Gen: NAD, alert, cooperative, and pleasant. HEENT: NCAT, EOMI, MMM Ext: +1 edema bilaterally, warm, FROM, strength/sensation intact. No bony deformities. Mild erythema of bilat LEs. Ecchymosis noted at fistula site. TTP of anterior lower leg compartment. Pulses present and cap refill sluggish at LEs.  Neuro: Alert and oriented, Speech clear, No gross deficits. DTRs symmetric   Assessment and plan:  Diabetic retinopathy associated with type 2 diabetes mellitus Worsening; patient's A1c was only 5.4 however she was recently taken off her Lantus. Since that time her home CBGs have been significantly elevated and patient is concerned. Recent hypoglycemic episode likely 2/2 reduced clearance from ESRD. Patient is not having a good response to  glipizide. - I have placed patient back on Lantus. At a reduced dose of 3units/day  x2 days followed by 5 units/day after that.  - she is to contact me w/ an update on her CBGs in 1 week. - an A1c of 5.4 is likely too low for a 74yo patient w/ ESRD. I have informed her that I am comfortable w/ her sugars in the higher 100s/low 200s, as long as we are avoiding ANY episodes of hypoglycemia.   Lower extremity edema Patient w/ bilateral leg pain to palpation and +1 edema. She is currently on warfarin w/ an INR slightly above the target range. Etiology is currently unknown but the DDx includes DVT, phlebitis, cellulitis, and acutely advancing vascular insufficiency.  - Obtaining INR today: 3.2 - Ordered venous doppler - Will monitor.  ESRD needing dialysis Stable; receiving HD on T/Th/Sat. Sessions are going well. - no new changes.    Orders Placed This Encounter  Procedures  . POCT A1C  . POCT INR    Meds ordered this encounter  Medications  . insulin glargine (LANTUS) 100 UNIT/ML injection    Sig: Inject 0.05 mLs (5 Units total) into the skin every morning. If blood sugar is >140 add 1 unit. If blood sugar is <140 subtract 1 unit.    Dispense:  10 mL    Refill:  2     Elberta Leatherwood, MD,MS,  PGY2 11/21/2015 12:00 PM

## 2015-11-20 NOTE — Patient Instructions (Addendum)
It was a pleasure seeing you today in our clinic. Today we discussed your diabetes and leg pain. Here is the treatment plan we have discussed and agreed upon together:   - I have ordered a lower extremity Doppler of both your legs to assess for any clots that could be causing her pain. - I have ordered to have another INR obtained. - Stopped taking the glipizide. - I would like to have you restart taking Lantus daily. For the next 2 days begin with 3 units every morning. Then, beginning on 12/15 start taking 5 units every morning. - Call me in one week, 11/27/15, to inform me of how your blood glucose levels have been over the past week. - If you experience any episodes of hypoglycemia, confusion lightheadedness headache drowsiness sweats, then call our office immediately or report to the nearest urgent care/emergency department. - I would like to have you follow-up with me in 3-4 weeks.

## 2015-11-21 LAB — PROTIME-INR: INR: 2.2 — AB (ref 0.9–1.1)

## 2015-11-21 NOTE — Assessment & Plan Note (Signed)
Patient w/ bilateral leg pain to palpation and +1 edema. She is currently on warfarin w/ an INR slightly above the target range. Etiology is currently unknown but the DDx includes DVT, phlebitis, cellulitis, and acutely advancing vascular insufficiency.  - Obtaining INR today: 3.2 - Ordered venous doppler - Will monitor.

## 2015-11-21 NOTE — Assessment & Plan Note (Signed)
Stable; receiving HD on T/Th/Sat. Sessions are going well. - no new changes.

## 2015-11-21 NOTE — Assessment & Plan Note (Signed)
Worsening; patient's A1c was only 5.4 however she was recently taken off her Lantus. Since that time her home CBGs have been significantly elevated and patient is concerned. Recent hypoglycemic episode likely 2/2 reduced clearance from ESRD. Patient is not having a good response to glipizide. - I have placed patient back on Lantus. At a reduced dose of 3units/day  x2 days followed by 5 units/day after that.  - she is to contact me w/ an update on her CBGs in 1 week. - an A1c of 5.4 is likely too low for a 74yo patient w/ ESRD. I have informed her that I am comfortable w/ her sugars in the higher 100s/low 200s, as long as we are avoiding ANY episodes of hypoglycemia.

## 2015-11-22 ENCOUNTER — Telehealth: Payer: Self-pay | Admitting: Family Medicine

## 2015-11-22 ENCOUNTER — Ambulatory Visit (HOSPITAL_COMMUNITY)
Admission: RE | Admit: 2015-11-22 | Discharge: 2015-11-22 | Disposition: A | Discharge status: Home / Self Care | Payer: Commercial Managed Care - HMO | Source: Ambulatory Visit | Attending: Family Medicine | Admitting: Family Medicine

## 2015-11-22 DIAGNOSIS — M79604 Pain in right leg: Secondary | ICD-10-CM | POA: Diagnosis not present

## 2015-11-22 DIAGNOSIS — M79605 Pain in left leg: Secondary | ICD-10-CM | POA: Insufficient documentation

## 2015-11-22 DIAGNOSIS — M79609 Pain in unspecified limb: Secondary | ICD-10-CM | POA: Diagnosis present

## 2015-11-22 NOTE — Progress Notes (Signed)
*  PRELIMINARY RESULTS* Vascular Ultrasound Lower extremity venous duplex has been completed.  Preliminary findings: No evidence of DVT or baker's cyst.   Called results to Dr. Alease Frame.   Landry Mellow, RDMS, RVT  11/22/2015, 9:28 AM

## 2015-11-22 NOTE — Telephone Encounter (Signed)
Received call from vascular lab -- patient's LE dopplers were negative for DVT. This is not surprising due to her promising INRs however it does eliminate a cause for her bilateral LE pain and swelling. With this news I will continue to look further in a possible etiology for this pain.  Elberta Leatherwood, MD,MS,  PGY2 11/22/2015 9:31 AM

## 2015-11-23 ENCOUNTER — Ambulatory Visit (INDEPENDENT_AMBULATORY_CARE_PROVIDER_SITE_OTHER): Payer: Commercial Managed Care - HMO | Admitting: Cardiovascular Disease

## 2015-11-23 DIAGNOSIS — I482 Chronic atrial fibrillation, unspecified: Secondary | ICD-10-CM

## 2015-11-23 DIAGNOSIS — Z5181 Encounter for therapeutic drug level monitoring: Secondary | ICD-10-CM

## 2015-12-12 ENCOUNTER — Ambulatory Visit (INDEPENDENT_AMBULATORY_CARE_PROVIDER_SITE_OTHER): Payer: Commercial Managed Care - HMO | Admitting: Internal Medicine

## 2015-12-12 DIAGNOSIS — I482 Chronic atrial fibrillation, unspecified: Secondary | ICD-10-CM

## 2015-12-12 DIAGNOSIS — Z5181 Encounter for therapeutic drug level monitoring: Secondary | ICD-10-CM

## 2015-12-12 LAB — POCT INR: INR: 2.39

## 2015-12-18 ENCOUNTER — Ambulatory Visit: Payer: Commercial Managed Care - HMO | Admitting: Family Medicine

## 2015-12-22 ENCOUNTER — Encounter: Payer: Self-pay | Admitting: Family Medicine

## 2015-12-22 ENCOUNTER — Ambulatory Visit (INDEPENDENT_AMBULATORY_CARE_PROVIDER_SITE_OTHER): Payer: Commercial Managed Care - HMO | Admitting: Family Medicine

## 2015-12-22 ENCOUNTER — Ambulatory Visit
Admission: RE | Admit: 2015-12-22 | Discharge: 2015-12-22 | Disposition: A | Discharge status: Home / Self Care | Payer: Commercial Managed Care - HMO | Source: Ambulatory Visit | Attending: Family Medicine | Admitting: Family Medicine

## 2015-12-22 VITALS — BP 105/41 | HR 60 | Temp 97.8°F | Ht 60.0 in | Wt 161.3 lb

## 2015-12-22 DIAGNOSIS — M25532 Pain in left wrist: Secondary | ICD-10-CM

## 2015-12-22 DIAGNOSIS — M25562 Pain in left knee: Secondary | ICD-10-CM | POA: Insufficient documentation

## 2015-12-22 DIAGNOSIS — M79662 Pain in left lower leg: Secondary | ICD-10-CM

## 2015-12-22 DIAGNOSIS — M25539 Pain in unspecified wrist: Secondary | ICD-10-CM | POA: Insufficient documentation

## 2015-12-22 NOTE — Assessment & Plan Note (Signed)
Patient continues to have lower left leg pain. Pain is currently of unknown etiology. There is palpable swelling of the soft tissues in this region. Venous Doppler was obtained last month and was negative for DVT. Patient has had therapeutic INR levels. - We'll obtain x-rays of this region today, in hopes to rule out any fracture/bony abnormality. - If x-rays are negative I will broaden our search for diagnosis. Differential outside of the above listed include muscle soreness versus bony contusion versus smoldering neoplasm versus compartment syndrome versus medial head gastrocnemius rupture. All of these are significantly unlikely however we will need to move forward to help resolve patient's significant discomfort.

## 2015-12-22 NOTE — Patient Instructions (Signed)
It was a pleasure seeing you today in our clinic. Today we discussed your leg and wrist pain. Here is the treatment plan we have discussed and agreed upon together:   - I would like for you to get x-rays done of your left arm and leg today. These orders have artery been sent in and should be done at [facility] on [address]. - I would like to see her back in our office in one week to go over the results and to further manage these issues.

## 2015-12-22 NOTE — Assessment & Plan Note (Signed)
Patient presents after experiencing a fall last week in which she experienced significant pain thereafter and her left wrist. On exam patient is acutely tender over the distal radius. Range of motion reduced with flexion and extension. Ecchymosis noted over the distal ulna. - We'll obtain x-rays today. - Advised to allow pain to be her guide and rest as necessary.

## 2015-12-22 NOTE — Progress Notes (Signed)
HPI  CC: Left wrist and leg pain. Fell 1 week ago yesterday. Fell backward. Called EMS to pick her up. Was told she didn't have any fracture (but no XR done). Left wrist is SEVERELY painful to palpation. No weakness/numbness. Ecchymosis present, and swelling.  Left leg swelling and pain. Painful to palpation. No pain w/ ROM or flexion/extension. Denies any weakness in this leg. No history of trauma. Pain is located to the medial aspect of the middle third of the lower leg. No rashes or lesions. Patient has had this complaint in the past. Workup for DVT last month was negative.  ROS: Patient denies any fevers, chills, nausea, vomiting, diarrhea, weakness, paresthesias, numbness, decreased range of motion, dizziness.  Past medical history and social history reviewed and updated in the EMR as appropriate.  Objective: BP 105/41 mmHg  Pulse 60  Temp(Src) 97.8 F (36.6 C) (Oral)  Ht 5' (1.524 m)  Wt 161 lb 4.8 oz (73.165 kg)  BMI 31.50 kg/m2 Gen: NAD, alert, cooperative, and pleasant. HEENT: NCAT, EOMI, MMM Ext: +1 edema bilaterally, warm, FROM, strength/sensation intact. No bony deformities. Mild erythema of bilat LEs. TTP of medial lower Left leg. No evidence of rashes. Pulses present and cap refill sluggish at LEs. Left upper extremity with significant tenderness to palpation along the distal radius. Ecchymosis noted over the distal ulna without any tenderness at that location. Range of motion of flexion and extension reduced at the left wrist when compared to right. Pulses intact, strength intact, sensation intact. Neuro: Alert and oriented, Speech clear, No gross deficits. DTRs symmetric   Assessment and plan:  Wrist pain, acute Patient presents after experiencing a fall last week in which she experienced significant pain thereafter and her left wrist. On exam patient is acutely tender over the distal radius. Range of motion reduced with flexion and extension. Ecchymosis noted over the  distal ulna. - We'll obtain x-rays today. - Advised to allow pain to be her guide and rest as necessary.  Pain of left lower leg Patient continues to have lower left leg pain. Pain is currently of unknown etiology. There is palpable swelling of the soft tissues in this region. Venous Doppler was obtained last month and was negative for DVT. Patient has had therapeutic INR levels. - We'll obtain x-rays of this region today, in hopes to rule out any fracture/bony abnormality. - If x-rays are negative I will broaden our search for diagnosis. Differential outside of the above listed include muscle soreness versus bony contusion versus smoldering neoplasm versus compartment syndrome versus medial head gastrocnemius rupture. All of these are significantly unlikely however we will need to move forward to help resolve patient's significant discomfort.     Orders Placed This Encounter  Procedures  . DG Wrist Complete Left    Standing Status: Future     Number of Occurrences: 1     Standing Expiration Date: 02/18/2017    Order Specific Question:  Reason for Exam (SYMPTOM  OR DIAGNOSIS REQUIRED)    Answer:  S/p fall/radial pain    Order Specific Question:  Preferred imaging location?    Answer:  [facility]  . DG Tibia/Fibula Left    Standing Status: Future     Number of Occurrences: 1     Standing Expiration Date: 02/18/2017    Order Specific Question:  Reason for Exam (SYMPTOM  OR DIAGNOSIS REQUIRED)    Answer:  S/p fall/radial pain    Order Specific Question:  Preferred imaging location?  Answer:  [facility]     Elberta Leatherwood, MD,MS,  PGY2 12/22/2015 7:49 PM

## 2015-12-24 ENCOUNTER — Emergency Department (HOSPITAL_COMMUNITY): Payer: Commercial Managed Care - HMO

## 2015-12-24 ENCOUNTER — Inpatient Hospital Stay (HOSPITAL_COMMUNITY): Payer: Commercial Managed Care - HMO

## 2015-12-24 ENCOUNTER — Encounter (HOSPITAL_COMMUNITY): Payer: Self-pay | Admitting: *Deleted

## 2015-12-24 ENCOUNTER — Other Ambulatory Visit: Payer: Self-pay

## 2015-12-24 ENCOUNTER — Inpatient Hospital Stay (HOSPITAL_COMMUNITY)
Admission: EM | Admit: 2015-12-24 | Discharge: 2015-12-28 | DRG: 064 | Disposition: A | Discharge status: Skilled Nursing Facility | Payer: Commercial Managed Care - HMO | Attending: Family Medicine | Admitting: Family Medicine

## 2015-12-24 DIAGNOSIS — I272 Other secondary pulmonary hypertension: Secondary | ICD-10-CM | POA: Diagnosis present

## 2015-12-24 DIAGNOSIS — Z955 Presence of coronary angioplasty implant and graft: Secondary | ICD-10-CM

## 2015-12-24 DIAGNOSIS — Z86718 Personal history of other venous thrombosis and embolism: Secondary | ICD-10-CM | POA: Diagnosis not present

## 2015-12-24 DIAGNOSIS — E213 Hyperparathyroidism, unspecified: Secondary | ICD-10-CM | POA: Diagnosis present

## 2015-12-24 DIAGNOSIS — R55 Syncope and collapse: Secondary | ICD-10-CM | POA: Diagnosis present

## 2015-12-24 DIAGNOSIS — I252 Old myocardial infarction: Secondary | ICD-10-CM

## 2015-12-24 DIAGNOSIS — K219 Gastro-esophageal reflux disease without esophagitis: Secondary | ICD-10-CM | POA: Diagnosis present

## 2015-12-24 DIAGNOSIS — M109 Gout, unspecified: Secondary | ICD-10-CM | POA: Diagnosis present

## 2015-12-24 DIAGNOSIS — I63413 Cerebral infarction due to embolism of bilateral middle cerebral arteries: Secondary | ICD-10-CM | POA: Insufficient documentation

## 2015-12-24 DIAGNOSIS — Z79899 Other long term (current) drug therapy: Secondary | ICD-10-CM | POA: Diagnosis not present

## 2015-12-24 DIAGNOSIS — R791 Abnormal coagulation profile: Secondary | ICD-10-CM | POA: Diagnosis present

## 2015-12-24 DIAGNOSIS — I69354 Hemiplegia and hemiparesis following cerebral infarction affecting left non-dominant side: Secondary | ICD-10-CM | POA: Insufficient documentation

## 2015-12-24 DIAGNOSIS — D539 Nutritional anemia, unspecified: Secondary | ICD-10-CM | POA: Insufficient documentation

## 2015-12-24 DIAGNOSIS — Z8672 Personal history of thrombophlebitis: Secondary | ICD-10-CM | POA: Diagnosis not present

## 2015-12-24 DIAGNOSIS — E1122 Type 2 diabetes mellitus with diabetic chronic kidney disease: Secondary | ICD-10-CM | POA: Diagnosis present

## 2015-12-24 DIAGNOSIS — Z9181 History of falling: Secondary | ICD-10-CM

## 2015-12-24 DIAGNOSIS — I5022 Chronic systolic (congestive) heart failure: Secondary | ICD-10-CM | POA: Insufficient documentation

## 2015-12-24 DIAGNOSIS — W19XXXA Unspecified fall, initial encounter: Secondary | ICD-10-CM | POA: Insufficient documentation

## 2015-12-24 DIAGNOSIS — M25552 Pain in left hip: Secondary | ICD-10-CM

## 2015-12-24 DIAGNOSIS — Z888 Allergy status to other drugs, medicaments and biological substances status: Secondary | ICD-10-CM | POA: Diagnosis not present

## 2015-12-24 DIAGNOSIS — J449 Chronic obstructive pulmonary disease, unspecified: Secondary | ICD-10-CM | POA: Diagnosis present

## 2015-12-24 DIAGNOSIS — E113599 Type 2 diabetes mellitus with proliferative diabetic retinopathy without macular edema, unspecified eye: Secondary | ICD-10-CM | POA: Diagnosis present

## 2015-12-24 DIAGNOSIS — M25559 Pain in unspecified hip: Secondary | ICD-10-CM | POA: Diagnosis not present

## 2015-12-24 DIAGNOSIS — M810 Age-related osteoporosis without current pathological fracture: Secondary | ICD-10-CM | POA: Diagnosis present

## 2015-12-24 DIAGNOSIS — Z794 Long term (current) use of insulin: Secondary | ICD-10-CM

## 2015-12-24 DIAGNOSIS — N186 End stage renal disease: Secondary | ICD-10-CM | POA: Diagnosis present

## 2015-12-24 DIAGNOSIS — E669 Obesity, unspecified: Secondary | ICD-10-CM | POA: Diagnosis present

## 2015-12-24 DIAGNOSIS — N2581 Secondary hyperparathyroidism of renal origin: Secondary | ICD-10-CM | POA: Diagnosis present

## 2015-12-24 DIAGNOSIS — Z992 Dependence on renal dialysis: Secondary | ICD-10-CM | POA: Diagnosis not present

## 2015-12-24 DIAGNOSIS — E11649 Type 2 diabetes mellitus with hypoglycemia without coma: Secondary | ICD-10-CM | POA: Diagnosis not present

## 2015-12-24 DIAGNOSIS — E785 Hyperlipidemia, unspecified: Secondary | ICD-10-CM | POA: Diagnosis present

## 2015-12-24 DIAGNOSIS — I2510 Atherosclerotic heart disease of native coronary artery without angina pectoris: Secondary | ICD-10-CM | POA: Diagnosis present

## 2015-12-24 DIAGNOSIS — I6320 Cerebral infarction due to unspecified occlusion or stenosis of unspecified precerebral arteries: Secondary | ICD-10-CM | POA: Diagnosis not present

## 2015-12-24 DIAGNOSIS — I6330 Cerebral infarction due to thrombosis of unspecified cerebral artery: Secondary | ICD-10-CM | POA: Diagnosis not present

## 2015-12-24 DIAGNOSIS — W1830XA Fall on same level, unspecified, initial encounter: Secondary | ICD-10-CM | POA: Diagnosis present

## 2015-12-24 DIAGNOSIS — Z7901 Long term (current) use of anticoagulants: Secondary | ICD-10-CM | POA: Diagnosis not present

## 2015-12-24 DIAGNOSIS — Z87891 Personal history of nicotine dependence: Secondary | ICD-10-CM | POA: Diagnosis not present

## 2015-12-24 DIAGNOSIS — R634 Abnormal weight loss: Secondary | ICD-10-CM | POA: Insufficient documentation

## 2015-12-24 DIAGNOSIS — G8194 Hemiplegia, unspecified affecting left nondominant side: Secondary | ICD-10-CM | POA: Diagnosis present

## 2015-12-24 DIAGNOSIS — R630 Anorexia: Secondary | ICD-10-CM | POA: Insufficient documentation

## 2015-12-24 DIAGNOSIS — W19XXXD Unspecified fall, subsequent encounter: Secondary | ICD-10-CM | POA: Diagnosis not present

## 2015-12-24 DIAGNOSIS — I951 Orthostatic hypotension: Secondary | ICD-10-CM | POA: Insufficient documentation

## 2015-12-24 DIAGNOSIS — I480 Paroxysmal atrial fibrillation: Secondary | ICD-10-CM | POA: Insufficient documentation

## 2015-12-24 DIAGNOSIS — Z8673 Personal history of transient ischemic attack (TIA), and cerebral infarction without residual deficits: Secondary | ICD-10-CM | POA: Insufficient documentation

## 2015-12-24 DIAGNOSIS — D649 Anemia, unspecified: Secondary | ICD-10-CM | POA: Diagnosis present

## 2015-12-24 DIAGNOSIS — Z6831 Body mass index (BMI) 31.0-31.9, adult: Secondary | ICD-10-CM

## 2015-12-24 DIAGNOSIS — I639 Cerebral infarction, unspecified: Secondary | ICD-10-CM | POA: Diagnosis not present

## 2015-12-24 DIAGNOSIS — I2721 Secondary pulmonary arterial hypertension: Secondary | ICD-10-CM | POA: Diagnosis present

## 2015-12-24 DIAGNOSIS — I10 Essential (primary) hypertension: Secondary | ICD-10-CM | POA: Diagnosis not present

## 2015-12-24 DIAGNOSIS — K5900 Constipation, unspecified: Secondary | ICD-10-CM | POA: Insufficient documentation

## 2015-12-24 DIAGNOSIS — I6340 Cerebral infarction due to embolism of unspecified cerebral artery: Secondary | ICD-10-CM | POA: Diagnosis present

## 2015-12-24 DIAGNOSIS — I6789 Other cerebrovascular disease: Secondary | ICD-10-CM | POA: Diagnosis not present

## 2015-12-24 DIAGNOSIS — R54 Age-related physical debility: Secondary | ICD-10-CM | POA: Diagnosis present

## 2015-12-24 DIAGNOSIS — F329 Major depressive disorder, single episode, unspecified: Secondary | ICD-10-CM | POA: Diagnosis present

## 2015-12-24 DIAGNOSIS — I120 Hypertensive chronic kidney disease with stage 5 chronic kidney disease or end stage renal disease: Secondary | ICD-10-CM | POA: Diagnosis present

## 2015-12-24 LAB — CBC WITH DIFFERENTIAL/PLATELET
Basophils Absolute: 0.1 10*3/uL (ref 0.0–0.1)
Basophils Relative: 1 %
EOS ABS: 0.1 10*3/uL (ref 0.0–0.7)
Eosinophils Relative: 1 %
HEMATOCRIT: 37.8 % (ref 36.0–46.0)
HEMOGLOBIN: 12.4 g/dL (ref 12.0–15.0)
LYMPHS ABS: 1.6 10*3/uL (ref 0.7–4.0)
Lymphocytes Relative: 17 %
MCH: 34.3 pg — AB (ref 26.0–34.0)
MCHC: 32.8 g/dL (ref 30.0–36.0)
MCV: 104.4 fL — ABNORMAL HIGH (ref 78.0–100.0)
MONO ABS: 0.7 10*3/uL (ref 0.1–1.0)
MONOS PCT: 8 %
NEUTROS PCT: 73 %
Neutro Abs: 6.9 10*3/uL (ref 1.7–7.7)
Platelets: 189 10*3/uL (ref 150–400)
RBC: 3.62 MIL/uL — ABNORMAL LOW (ref 3.87–5.11)
RDW: 15.1 % (ref 11.5–15.5)
WBC: 9.3 10*3/uL (ref 4.0–10.5)

## 2015-12-24 LAB — I-STAT CHEM 8, ED
BUN: 21 mg/dL — ABNORMAL HIGH (ref 6–20)
CHLORIDE: 95 mmol/L — AB (ref 101–111)
Calcium, Ion: 1.23 mmol/L (ref 1.13–1.30)
Creatinine, Ser: 5.2 mg/dL — ABNORMAL HIGH (ref 0.44–1.00)
Glucose, Bld: 94 mg/dL (ref 65–99)
HEMATOCRIT: 39 % (ref 36.0–46.0)
HEMOGLOBIN: 13.3 g/dL (ref 12.0–15.0)
POTASSIUM: 4.7 mmol/L (ref 3.5–5.1)
Sodium: 135 mmol/L (ref 135–145)
TCO2: 30 mmol/L (ref 0–100)

## 2015-12-24 LAB — COMPREHENSIVE METABOLIC PANEL
ALT: 7 U/L — ABNORMAL LOW (ref 14–54)
ANION GAP: 14 (ref 5–15)
AST: 29 U/L (ref 15–41)
Albumin: 2.4 g/dL — ABNORMAL LOW (ref 3.5–5.0)
Alkaline Phosphatase: 104 U/L (ref 38–126)
BUN: 14 mg/dL (ref 6–20)
CHLORIDE: 94 mmol/L — AB (ref 101–111)
CO2: 28 mmol/L (ref 22–32)
Calcium: 10.5 mg/dL — ABNORMAL HIGH (ref 8.9–10.3)
Creatinine, Ser: 5.37 mg/dL — ABNORMAL HIGH (ref 0.44–1.00)
GFR calc non Af Amer: 7 mL/min — ABNORMAL LOW (ref >60)
GFR, EST AFRICAN AMERICAN: 8 mL/min — AB (ref >60)
Glucose, Bld: 100 mg/dL — ABNORMAL HIGH (ref 65–99)
POTASSIUM: 4.6 mmol/L (ref 3.5–5.1)
SODIUM: 136 mmol/L (ref 135–145)
Total Bilirubin: 0.9 mg/dL (ref 0.3–1.2)
Total Protein: 5.7 g/dL — ABNORMAL LOW (ref 6.5–8.1)

## 2015-12-24 LAB — CG4 I-STAT (LACTIC ACID): Lactic Acid, Venous: 1.08 mmol/L (ref 0.5–2.0)

## 2015-12-24 LAB — I-STAT TROPONIN, ED: TROPONIN I, POC: 0.04 ng/mL (ref 0.00–0.08)

## 2015-12-24 LAB — GLUCOSE, CAPILLARY
GLUCOSE-CAPILLARY: 83 mg/dL (ref 65–99)
Glucose-Capillary: 131 mg/dL — ABNORMAL HIGH (ref 65–99)

## 2015-12-24 LAB — CBG MONITORING, ED: GLUCOSE-CAPILLARY: 73 mg/dL (ref 65–99)

## 2015-12-24 LAB — I-STAT CG4 LACTIC ACID, ED: LACTIC ACID, VENOUS: 1.4 mmol/L (ref 0.5–2.0)

## 2015-12-24 LAB — PROTIME-INR
INR: 5.84 — AB (ref 0.00–1.49)
Prothrombin Time: >50 seconds — ABNORMAL HIGH (ref 11.6–15.2)

## 2015-12-24 LAB — TSH: TSH: 0.918 u[IU]/mL (ref 0.350–4.500)

## 2015-12-24 LAB — TYPE AND SCREEN
ABO/RH(D): O POS
Antibody Screen: NEGATIVE

## 2015-12-24 LAB — CK: CK TOTAL: 175 U/L (ref 38–234)

## 2015-12-24 MED ORDER — MOMETASONE FURO-FORMOTEROL FUM 100-5 MCG/ACT IN AERO
2.0000 | INHALATION_SPRAY | Freq: Two times a day (BID) | RESPIRATORY_TRACT | Status: DC
  Administered 2015-12-24 – 2015-12-28 (×6): 2 via RESPIRATORY_TRACT
  Filled 2015-12-24: qty 8.8

## 2015-12-24 MED ORDER — ALLOPURINOL 300 MG PO TABS
300.0000 mg | ORAL_TABLET | Freq: Every day | ORAL | Status: DC
  Administered 2015-12-25 – 2015-12-28 (×4): 300 mg via ORAL
  Filled 2015-12-24 (×4): qty 1

## 2015-12-24 MED ORDER — CALCIUM ACETATE (PHOS BINDER) 667 MG PO CAPS
1334.0000 mg | ORAL_CAPSULE | Freq: Three times a day (TID) | ORAL | Status: DC
  Administered 2015-12-25: 1334 mg via ORAL
  Filled 2015-12-24: qty 2

## 2015-12-24 MED ORDER — PRAVASTATIN SODIUM 40 MG PO TABS: 40.0000 mg | ORAL_TABLET | Freq: Every day | ORAL | Status: DC

## 2015-12-24 MED ORDER — SODIUM CHLORIDE 0.9 % IJ SOLN
3.0000 mL | Freq: Two times a day (BID) | INTRAMUSCULAR | Status: DC
  Administered 2015-12-24 – 2015-12-28 (×8): 3 mL via INTRAVENOUS

## 2015-12-24 MED ORDER — SODIUM CHLORIDE 0.9 % IV BOLUS (SEPSIS)
500.0000 mL | Freq: Once | INTRAVENOUS | Status: AC
  Administered 2015-12-24: 500 mL via INTRAVENOUS

## 2015-12-24 MED ORDER — HYDROCODONE-ACETAMINOPHEN 5-325 MG PO TABS
1.0000 | ORAL_TABLET | Freq: Four times a day (QID) | ORAL | Status: DC | PRN
  Administered 2015-12-24 – 2015-12-28 (×4): 1 via ORAL
  Filled 2015-12-24 (×4): qty 1

## 2015-12-24 MED ORDER — ATORVASTATIN CALCIUM 40 MG PO TABS
40.0000 mg | ORAL_TABLET | Freq: Every day | ORAL | Status: DC
  Administered 2015-12-24 – 2015-12-26 (×3): 40 mg via ORAL
  Filled 2015-12-24 (×3): qty 1

## 2015-12-24 MED ORDER — ALBUTEROL SULFATE (2.5 MG/3ML) 0.083% IN NEBU: 2.5000 mg | INHALATION_SOLUTION | RESPIRATORY_TRACT | Status: DC | PRN

## 2015-12-24 MED ORDER — ASPIRIN EC 325 MG PO TBEC
325.0000 mg | DELAYED_RELEASE_TABLET | Freq: Once | ORAL | Status: AC
  Administered 2015-12-24: 325 mg via ORAL
  Filled 2015-12-24: qty 1

## 2015-12-24 NOTE — ED Notes (Signed)
Pt states she has a hx of vertigo. She reached up to turn her porch light off, became dizzy and fell, landing on her elbow. No LOC.  She laid on the floor until 9 am this morning when daughter came in and found her.  Pt is on blood thinners and is a dialysis pt (dialysis T-H-S). Skin tear noted to L elbow, bil leg pain and sacral pain.  VS 102/20, hr 78, ekg, 98% with multiple pvcs.  CBG 98.  AO x 4 per ems.

## 2015-12-24 NOTE — Consult Note (Addendum)
Stroke Consult Consulting Physician: Dr Nori Riis  Chief Complaint: CVA  HPI: Kristi [name] is an 75 y.o. female with hx of ESRD on HD, CAD, CHF, A fib, DM, HLD admitted with multiple syncopal episodes over the past month. On admission she was found to have LUE weakness so a MRI brain was ordered. Patient reports her left arm has been weak for around 1-2 weeks. She has a history of A fib for which she is on coumadin. INR on admission was 5.64  MRI brain imaging reviewed, shows small right basal ganglia infarct, question of right M1 stenosis and occlusion/slow flow of left vertebral artery.   Date last known well: unclear Time last known well: unclear tPA Given: no, unclear LSW Modified Rankin: Rankin Score=1  Past Medical History  Diagnosis Date  . Hyperlipidemia   . Hyperparathyroidism   . CAD (coronary artery disease)   . Myocardial infarction ([place]) 2008  . Hypertension   . COPD (chronic obstructive pulmonary disease) ([place])   . Diabetes mellitus     diagnosed 2010 - takes oral meds and lantus insulin  . Anemia   . GERD (gastroesophageal reflux disease)     on omeprazole  . Arthritis     all over  . CHF (congestive heart failure) ([place]) 08-2011  . CVA 02/19/2010  . CAD 02/19/2010  . DEEP VENOUS THROMBOPHLEBITIS, LEG, LEFT 03/08/2010    in 2009 s/p hospitalization- only 1 isolated dvt  . Pulmonary artery hypertension ([place]) 10/31/2014  . Shortness of breath dyspnea     with exertion  . Depression     years ago  . Dry skin   . Headache   . Dysrhythmia     atrial fibrillation  . Chronic kidney disease     esrd - since 2010 - DIALYIS     Past Surgical History  Procedure Laterality Date  . Av fistula placement  04/05/11    Left Radiocephalic AVF  . Appendectomy  1969  . Tubal ligation    . Revision of fisturl    . Coronary angioplasty with stent placement  06/23/2007    by Dr. Irish Lack is her cardiologist  . Ligation goretex fistula  10/29/2011    Procedure: LIGATION  GORE-TEX FISTULA;  Surgeon: Elam Dutch, MD;  Location: [facility] OR;  Service: Vascular;  Laterality: Left;  Ligation of competing branches left forearm fistula  . Fistulogram N/A 02/17/2012    Procedure: FISTULOGRAM;  Surgeon: Angelia Mould, MD;  Location: [facility] CATH LAB;  Service: Cardiovascular;  Laterality: N/A;  . Insertion of dialysis catheter N/A 01/12/2015    Procedure: INSERTION OF DIALYSIS CATHETER;  Surgeon: Conrad Clare, MD;  Location: [place];  Service: Vascular;  Laterality: N/A;  . Esophagogastroduodenoscopy N/A 01/20/2015    Procedure: ESOPHAGOGASTRODUODENOSCOPY (EGD);  Surgeon: Wonda Horner, MD;  Location: [facility] ENDOSCOPY;  Service: Endoscopy;  Laterality: N/A;  . Eye surgery Bilateral     cataract surgery with lens implants  . Tonsillectomy    . Bascilic vein transposition Left 03/24/2015    Procedure: LEFT ARM BASILIC VEIN TRANSPOSITION;  Surgeon: Angelia Mould, MD;  Location: [place];  Service: Vascular;  Laterality: Left;  . Ligation of arteriovenous  fistula Left 03/24/2015    Procedure: LIGATION OF left arm radio-cephalic ARTERIOVENOUS  FISTULA;  Surgeon: Angelia Mould, MD;  Location: [place];  Service: Vascular;  Laterality: Left;  . Revison of arteriovenous fistula Left 03/11/3535    Procedure: PLICATION OF BASILIC VEIN TRANSPOSITION FISTULA;  Surgeon: Angelia Mould, MD;  Location: [facility] OR;  Service: Vascular;  Laterality: Left;    Family History  Problem Relation Age of Onset  . Cancer Mother     multiple myloma  . Dementia Father   . Pneumonia Father   . Anuerysm Father    Social History:  reports that she quit smoking about 20 months ago. Her smoking use included Cigarettes. She started smoking about 55 years ago. She has a 26.5 pack-year smoking history. She has never used smokeless tobacco. She reports that she drinks alcohol. She reports that she does not use illicit drugs.  Allergies:  Allergies  Allergen Reactions  . Losartan Other (See  Comments)    Causes increase in creatinine and worsening CKD  . Chantix [Varenicline] Nausea Only and Other (See Comments)    Dizzy and GI symptoms     Medications Prior to Admission  Medication Sig Dispense Refill  . acetaminophen (TYLENOL) 500 MG tablet Take 1,000 mg by mouth every 6 (six) hours as needed for moderate pain.     Marland Kitchen albuterol (PROVENTIL HFA;VENTOLIN HFA) 108 (90 BASE) MCG/ACT inhaler Inhale into the lungs every 4 (four) hours as needed for wheezing.    Marland Kitchen allopurinol (ZYLOPRIM) 300 MG tablet Take 300 mg by mouth daily.  5  . calcium acetate (PHOSLO) 667 MG capsule Take 1,334 mg by mouth 3 (three) times daily with meals.     Marland Kitchen CINNAMON PO Take 1,000 mg by mouth daily.    . ferrous sulfate 325 (65 FE) MG tablet Take 325 mg by mouth daily.    . fexofenadine (ALLEGRA) 180 MG tablet Take 180 mg by mouth daily as needed for allergies or rhinitis.    . furosemide (LASIX) 80 MG tablet Take 80-240 mg by mouth daily. Takes 3 tabs on Sun, Mon, Wed and Fri  Takes 1 tab all other days    . insulin glargine (LANTUS) 100 UNIT/ML injection Inject 0.05 mLs (5 Units total) into the skin every morning. If blood sugar is >140 add 1 unit. If blood sugar is <140 subtract 1 unit. 10 mL 2  . metoprolol succinate (TOPROL-XL) 25 MG 24 hr tablet Take 12.5 mg by mouth 2 (two) times a week. Takes on tues and thurs    . midodrine (PROAMATINE) 10 MG tablet Take 10 mg by mouth 2 (two) times daily.     . mometasone-formoterol (DULERA) 100-5 MCG/ACT AERO Inhale 2 puffs into the lungs 2 (two) times daily as needed for shortness of breath.    . nitroGLYCERIN (NITROSTAT) 0.4 MG SL tablet Place 0.4 mg under the tongue every 5 (five) minutes as needed. For chest pain     Call 911 if pain not relieved by 3rd tab    . omeprazole (PRILOSEC) 20 MG capsule Take 1 capsule (20 mg total) by mouth at bedtime. (Patient taking differently: Take 20 mg by mouth daily as needed (only takes when eating late in the night). ) 90  capsule 3  . Polyethyl Glycol-Propyl Glycol (SYSTANE OP) Apply 1-2 drops to eye 4 (four) times daily as needed. For dry eyes    . pravastatin (PRAVACHOL) 40 MG tablet TAKE 1 TABLET BY MOUTH EVERY DAY 90 tablet 3  . vitamin B-12 (CYANOCOBALAMIN) 1000 MCG tablet Take 1,000 mcg by mouth every morning.     . warfarin (COUMADIN) 5 MG tablet TAKE AS DIRECTED BY COUMADIN CLINIC (Patient taking differently: TAKES 2.5MG ON TUES ONLY, TAKES 5MG ALL OTHER DAYS) 35 tablet  3  . Blood Glucose Monitoring Suppl (ACCU-CHEK AVIVA) device Use as instructed 1 each 0  . glucose blood (ACCU-CHEK AVIVA) test strip Use as instructed 100 each 12  . Lancet Devices (ACCU-CHEK SOFTCLIX) lancets Use as instructed 100 each 12  . Lancets Misc. (ACCU-CHEK SOFTCLIX LANCET DEV) KIT 1 strip by Does not apply route 3 (three) times daily. 1 kit 0  . Omega-3 Fatty Acids (FISH OIL) 1200 MG CAPS Take 2 capsules by mouth 2 (two) times daily.    Marland Kitchen oxyCODONE-acetaminophen (ROXICET) 5-325 MG per tablet Take 1-2 tablets by mouth every 4 (four) hours as needed for severe pain. (Patient not taking: Reported on 10/06/2015) 30 tablet 0  . Tiotropium Bromide Monohydrate 2.5 MCG/ACT AERS Inhale 2 puffs into the lungs daily. 1 Inhaler 11    ROS: Out of a complete 14 system review, the patient complains of only the following symptoms, and all other reviewed systems are negative.   Physical Examination: Filed Vitals:   12/24/15 2039 12/24/15 2051  BP: 92/36 100/40  Pulse: 80   Temp: 98.1 F (36.7 C)   Resp: 18    Physical Exam  Constitutional: He appears well-developed and well-nourished.  Psych: Affect appropriate to situation Eyes: No scleral injection HENT: No OP obstrucion Head: Normocephalic.  Cardiovascular: irregular  Respiratory: Effort normal and breath sounds normal.  GI: Soft. Bowel sounds are normal. No distension. There is no tenderness.  Skin: WDI   Neurologic Examination: Mental Status: Alert, oriented,  thought content appropriate.  Speech fluent without evidence of aphasia.  Able to follow 3 step commands without difficulty. Cranial Nerves: II: funduscopic exam wnl bilaterally, visual fields grossly normal, pupils equal, round, reactive to light and accommodation III,IV, VI: ptosis not present, extra-ocular motions intact bilaterally V,VII: flattening of left NLF, facial light touch sensation normal bilaterally VIII: hearing normal bilaterally IX,X: gag reflex present XI: trapezius strength/neck flexion strength normal bilaterally XII: tongue strength normal  Motor: left pronator drift noted Right : Upper extremity    Left:     Upper extremity 5/5 deltoid       4-/5 deltoid 5/5 biceps      4-/5 biceps  5/5 triceps      4/5 triceps 5/5 hand grip      4/5 hand grip  Lower extremity     Lower extremity Refuses due to back pain Tone and bulk:normal tone throughout; no atrophy noted Sensory: Pinprick and light touch intact throughout, bilaterally Deep Tendon Reflexes: 1+ and symmetric throughout Plantars: Right: downgoing   Left: downgoing Cerebellar: Impaired FTN on the left due to weakness Gait: deferred  Laboratory Studies:   Basic Metabolic Panel:  Recent Labs Lab 12/24/15 1300 12/24/15 1307  NA 136 135  K 4.6 4.7  CL 94* 95*  CO2 28  --   GLUCOSE 100* 94  BUN 14 21*  CREATININE 5.37* 5.20*  CALCIUM 10.5*  --     Liver Function Tests:  Recent Labs Lab 12/24/15 1300  AST 29  ALT 7*  ALKPHOS 104  BILITOT 0.9  PROT 5.7*  ALBUMIN 2.4*   No results for input(s): LIPASE, AMYLASE in the last 168 hours. No results for input(s): AMMONIA in the last 168 hours.  CBC:  Recent Labs Lab 12/24/15 1135 12/24/15 1307  WBC 9.3  --   NEUTROABS 6.9  --   HGB 12.4 13.3  HCT 37.8 39.0  MCV 104.4*  --   PLT 189  --     Cardiac Enzymes:  Recent Labs Lab 12/24/15 1300  CKTOTAL 175    BNP: Invalid input(s): POCBNP  CBG:  Recent Labs Lab 12/24/15 1029  12/24/15 1718 12/24/15 2113  GLUCAP 73 33 131*    Microbiology: Results for orders placed or performed during the hospital encounter of 01/09/15  MRSA PCR Screening     Status: None   Collection Time: 01/09/15 10:49 PM  Result Value Ref Range Status   MRSA by PCR NEGATIVE NEGATIVE Final    Comment:        The GeneXpert MRSA Assay (FDA approved for NASAL specimens only), is one component of a comprehensive MRSA colonization surveillance program. It is not intended to diagnose MRSA infection nor to guide or monitor treatment for MRSA infections.   Urine culture     Status: None   Collection Time: 01/10/15  1:16 AM  Result Value Ref Range Status   Specimen Description URINE, CATHETERIZED  Final   Special Requests NONE  Final   Colony Count   Final    >=100,000 COLONIES/ML Performed at [facility]   Final    ENTEROBACTER CLOACAE Performed at [facility]    Report Status 01/12/2015 FINAL  Final   Organism ID, Bacteria ENTEROBACTER CLOACAE  Final      Susceptibility   Enterobacter cloacae - MIC*    CEFAZOLIN >=64 RESISTANT Resistant     CEFTRIAXONE >=64 RESISTANT Resistant     CIPROFLOXACIN <=0.25 SENSITIVE Sensitive     GENTAMICIN <=1 SENSITIVE Sensitive     LEVOFLOXACIN <=0.12 SENSITIVE Sensitive     NITROFURANTOIN 32 SENSITIVE Sensitive     TOBRAMYCIN <=1 SENSITIVE Sensitive     TRIMETH/SULFA <=20 SENSITIVE Sensitive     PIP/TAZO >=128 RESISTANT Resistant     * ENTEROBACTER CLOACAE  Clostridium Difficile by PCR     Status: None   Collection Time: 01/11/15 11:35 AM  Result Value Ref Range Status   Toxigenic C Difficile by pcr NEGATIVE NEGATIVE Final    Coagulation Studies:  Recent Labs  12/24/15 1204  LABPROT >50.0*  INR 5.84*    Urinalysis: No results for input(s): COLORURINE, LABSPEC, PHURINE, GLUCOSEU, HGBUR, BILIRUBINUR, KETONESUR, PROTEINUR, UROBILINOGEN, NITRITE, LEUKOCYTESUR in the last 168 hours.  Invalid input(s):  APPERANCEUR  Lipid Panel:     Component Value Date/Time   CHOL 157 09/30/2014 0827   CHOL 164 04/05/2013   TRIG 166* 09/30/2014 0827   TRIG 39 04/05/2013   HDL 32* 09/30/2014 0827   CHOLHDL 4.9 09/30/2014 0827   VLDL 33 09/30/2014 0827   LDLCALC 92 09/30/2014 0827   LDLCALC 98 04/05/2013    HgbA1C:  Lab Results  Component Value Date   HGBA1C 5.4 11/20/2015    Urine Drug Screen:  No results found for: LABOPIA, COCAINSCRNUR, LABBENZ, AMPHETMU, THCU, LABBARB  Alcohol Level: No results for input(s): ETH in the last 168 hours.  Other results:  Imaging: Dg Chest 2 View  12/24/2015  CLINICAL DATA:  Vertigo.  Fall.  Chronic renal failure on dialysis. EXAM: CHEST  2 VIEW COMPARISON:  01/19/2015 FINDINGS: Mild cardiomegaly is stable. Pulmonary hyperinflation and coarsening of interstitial lung markings is stable. No evidence of acute pulmonary infiltrate or edema. No evidence of pneumothorax or pleural effusion. Right jugular central venous dialysis catheter remains in appropriate position. IMPRESSION: Stable mild cardiomegaly.  Suspect COPD.  No active lung disease. Electronically Signed   By: Earle Gell M.D.   On: 12/24/2015 11:49   Dg Wrist Complete Left  12/24/2015  CLINICAL DATA:  Became dizzy and fell landing on her elbow, was on floor until 0900 this morning when she was found by her daughter, history dialysis EXAM: LEFT WRIST - COMPLETE 3+ VIEW COMPARISON:  None FINDINGS: Diffuse soft tissue swelling. Surgical clips at the radial aspect of distal forearm question related to dialysis access procedure. Extensive small vessel vascular calcifications. Degenerative changes first CMC joint. No definite acute fracture, dislocation or bone destruction. IMPRESSION: Osseous demineralization with degenerative changes at first [facility] joint. No definite acute bony abnormalities. Electronically Signed   By: Lavonia Dana M.D.   On: 12/24/2015 11:45   Ct Head Wo Contrast  12/24/2015  CLINICAL DATA:  Pt  states she became dizzy and fell, was on floor until 9 am. Hit side of head, also c/o neck stiffness Pt on blood thinners Hx of vertigo, dialysis, CVA, CHF, HTN, DM EXAM: CT HEAD WITHOUT CONTRAST CT CERVICAL SPINE WITHOUT CONTRAST TECHNIQUE: Multidetector CT imaging of the head and cervical spine was performed following the standard protocol without intravenous contrast. Multiplanar CT image reconstructions of the cervical spine were also generated. COMPARISON:  CT head 01/16/2010. FINDINGS: CT HEAD FINDINGS No evidence for acute infarction, hemorrhage, mass lesion, hydrocephalus, or extra-axial fluid. Generalized atrophy. Remote LEFT parietal infarct. Generalized hypoattenuation of white matter representing small vessel disease. No skull fracture is evident. There is moderate vascular calcification. No sinus or mastoid air fluid level. BILATERAL cataract extraction. CT CERVICAL SPINE FINDINGS There is no visible cervical spine fracture, traumatic subluxation, prevertebral soft tissue swelling, or intraspinal hematoma. Advanced disc space narrowing of a chronic nature at C3-4, C5-6, and C6-7. No neck masses. No lung apex lesion. Atherosclerosis. Dual lumen dialysis catheter from a RIGHT IJ approach. No worrisome osseous lesions. IMPRESSION: Atrophy and small vessel disease.  Remote LEFT parietal infarct. No skull fracture or intracranial hemorrhage. Advanced cervical spondylosis.  Extensive atherosclerosis. No cervical spine fracture or traumatic subluxation. Electronically Signed   By: Staci Righter M.D.   On: 12/24/2015 11:17   Ct Cervical Spine Wo Contrast  12/24/2015  CLINICAL DATA:  Pt states she became dizzy and fell, was on floor until 9 am. Hit side of head, also c/o neck stiffness Pt on blood thinners Hx of vertigo, dialysis, CVA, CHF, HTN, DM EXAM: CT HEAD WITHOUT CONTRAST CT CERVICAL SPINE WITHOUT CONTRAST TECHNIQUE: Multidetector CT imaging of the head and cervical spine was performed following the  standard protocol without intravenous contrast. Multiplanar CT image reconstructions of the cervical spine were also generated. COMPARISON:  CT head 01/16/2010. FINDINGS: CT HEAD FINDINGS No evidence for acute infarction, hemorrhage, mass lesion, hydrocephalus, or extra-axial fluid. Generalized atrophy. Remote LEFT parietal infarct. Generalized hypoattenuation of white matter representing small vessel disease. No skull fracture is evident. There is moderate vascular calcification. No sinus or mastoid air fluid level. BILATERAL cataract extraction. CT CERVICAL SPINE FINDINGS There is no visible cervical spine fracture, traumatic subluxation, prevertebral soft tissue swelling, or intraspinal hematoma. Advanced disc space narrowing of a chronic nature at C3-4, C5-6, and C6-7. No neck masses. No lung apex lesion. Atherosclerosis. Dual lumen dialysis catheter from a RIGHT IJ approach. No worrisome osseous lesions. IMPRESSION: Atrophy and small vessel disease.  Remote LEFT parietal infarct. No skull fracture or intracranial hemorrhage. Advanced cervical spondylosis.  Extensive atherosclerosis. No cervical spine fracture or traumatic subluxation. Electronically Signed   By: Staci Righter M.D.   On: 12/24/2015 11:17   Mri Brain Without Contrast  12/24/2015  ADDENDUM REPORT: 12/24/2015 22:07  ADDENDUM: Possibly stenotic distal RIGHT M1 segment and, possible slow flow or occlusion LEFT vertebral artery ; findings would be better characterized on MRA of the head. Electronically Signed   By: Elon Alas M.D.   On: 12/24/2015 22:07  12/24/2015  CLINICAL DATA:  Multiple syncopal episodes. New RIGHT hand weakness. History of diabetes, hypertension, hyperlipidemia, stroke, chronic renal disease. EXAM: MRI HEAD WITHOUT CONTRAST TECHNIQUE: Multiplanar, multiecho pulse sequences of the brain and surrounding structures were obtained without intravenous contrast. COMPARISON:  CT head December 24, 2015 at 1044 hours FINDINGS:  Subcentimeter focus of reduced diffusion RIGHT corona radiata/basal ganglia with low ADC value. Linear subcentimeter focus of reduced diffusion LEFT occipital lobe, favoring T2 shine through. Small nonspecific micro hemorrhage LEFT parietal occipital lobe associated with encephalomalacia. Small focus of encephalomalacia LEFT frontal lobe. Moderate ventriculomegaly on the basis of global parenchymal brain volume loss. Patchy to confluent supratentorial and pontine white matter FLAIR T2 hyperintensities. Old bilateral basal ganglia and bilateral thalamus lacunar infarcts. Old small RIGHT cerebellar infarcts. No midline shift, mass effect or mass lesions. No abnormal extra-axial fluid collections. No discernible LEFT vertebral artery flow void. Suspected stenosis distal RIGHT M1 segment, though partial volume Ing female result in this appearance. Status post bilateral ocular lens implants. Visualized paranasal sinuses and mastoid air cells are well aerated. No abnormal sellar expansion. No cerebellar tonsillar ectopia. No suspicious calvarial bone marrow signal. IMPRESSION: Subcentimeter acute ischemia RIGHT corona radiata/ basal ganglia. LEFT parietal occipital encephalomalacia suggests watershed infarct or old trauma. Small area LEFT frontal lobe encephalomalacia. Moderate chronic small vessel ischemic disease. Old bilateral basal ganglia and thalamus lacunar infarcts. Old small RIGHT cerebellar infarcts. Nonvisualized LEFT vertebral artery flow void, this could represent RIGHT dominance or, slow flow/occlusion. Electronically Signed: By: Elon Alas M.D. On: 12/24/2015 20:58   Dg Hip Unilat With Pelvis 2-3 Views Left  12/24/2015  CLINICAL DATA:  Vertigo.  Fall. EXAM: DG HIP (WITH OR WITHOUT PELVIS) 2-3V LEFT COMPARISON:  None. FINDINGS: Osteopenia. No acute fracture. No dislocation. Prominent vascular calcifications. Mild degenerative change in the hip joints. IMPRESSION: No acute bony pathology.  Electronically Signed   By: Marybelle Killings M.D.   On: 12/24/2015 11:47    Assessment: 75 y.o. female hx of A fib (on coumadin), CHF, DM, HLD, ESRD on HD presenting with recurrent syncopal episodes and LUE weakness. MRI shows acute right basal ganglia infarct and question of left parietal occipital infarct. On coumadin with INR of 5.64 on admission.    Plan: 1. HgbA1c, fasting lipid panel 2.  MRA  of the brain without contrast 3. PT consult, OT consult, Speech consult 4. Echocardiogram 5. Carotid dopplers 6. Prophylactic therapy-restart coumadin with INR normalizes 7. Risk factor modification 8. Telemetry monitoring 9. Frequent neuro checks 10. NPO until RN stroke swallow screen   Jim Like, DO [facility] [phone]  If 7pm- 7am, please page neurology on call as listed in [place]. 12/24/2015, 10:30 PM

## 2015-12-24 NOTE — Progress Notes (Signed)
New pt admission from ED. Pt brought to the floor in stable condition. Vitals taken. Unable to assess orthostatic vitals due to pt unable to stand at this time. Initial Assessment done. All immediate pertinent needs to patient addressed. Patient Guide given to patient. Important safety instructions relating to hospitalization reviewed with patient. Patient verbalized understanding. Will continue to monitor pt.

## 2015-12-24 NOTE — ED Notes (Addendum)
Attempted report x1. 

## 2015-12-24 NOTE — Progress Notes (Signed)
Call Pager [phone] for any questions or notifications regarding this patient  FMTS Attending Admission Note: Dorcas Mcmurray MD Attending pager:[phone] I  have seen and examined this patient, reviewed their chart. I have discussed this patient with the resident. I agree with the resident's findings, assessment and care plan.. Please see complete H and P for details. Additional comments include: 1. Several falls in last few weeks, most notably last night when she fell and could not get up. Was at her home and found by her daughter this AM.  Has symptoms of weakness prior to fall. No spinning or chest pain. Only other new thing going on is loss of appetite in the same time frame, last 3-4 weeks. Also some constipation. Has maybe lost 10 pounds, not sure exactly. Will begin workup syncope /pre-syncope. Unclear at this time if we need to further work up change in appetite and constipation or if that will be an outpatient event; not sure it relates to most pressing issue which is fall after pre-syncope. 2. Left hip pain--still pretty painful. Xrays are negative but I will get CT scan to rule out occult fracture. Left wrist sore but x ray negative and my exam non-focal. 3. ESRD: she reports her dry weight as 75 kg but says at last dialysis they d/c her at 73.5 kg so potentially that might be a contributor to her presyncopal event.  I discussed our plan for workup with her and her daughter in the ED and answered their questions.Her PCP is Dr. Alease Frame and she has an existing appointment with him this coming Friday.

## 2015-12-24 NOTE — Progress Notes (Signed)
ANTICOAGULATION CONSULT NOTE - Initial Consult  Pharmacy Consult for coumadin Indication: atrial fibrillation  Allergies  Allergen Reactions  . Losartan Other (See Comments)    Causes increase in creatinine and worsening CKD  . Chantix [Varenicline] Nausea Only and Other (See Comments)    Dizzy and GI symptoms     Patient Measurements: Height: 5' (152.4 cm) Weight: 162 lb 4.1 oz (73.6 kg) IBW/kg (Calculated) : 45.5  Vital Signs: Temp: 98.2 F (36.8 C) (01/15 1652) Temp Source: Oral (01/15 1652) BP: 126/52 mmHg (01/15 1652) Pulse Rate: 78 (01/15 1652)  Labs:  Recent Labs  12/24/15 1135 12/24/15 1204 12/24/15 1300 12/24/15 1307  HGB 12.4  --   --  13.3  HCT 37.8  --   --  39.0  PLT 189  --   --   --   LABPROT  --  >50.0*  --   --   INR  --  5.84*  --   --   CREATININE  --   --  5.37* 5.20*  CKTOTAL  --   --  175  --     Estimated Creatinine Clearance: 8.4 mL/min (by C-G formula based on Cr of 5.2).   Medical History: Past Medical History  Diagnosis Date  . Hyperlipidemia   . Hyperparathyroidism   . CAD (coronary artery disease)   . Myocardial infarction ([place]) 2008  . Hypertension   . COPD (chronic obstructive pulmonary disease) ([place])   . Diabetes mellitus     diagnosed 2010 - takes oral meds and lantus insulin  . Anemia   . GERD (gastroesophageal reflux disease)     on omeprazole  . Arthritis     all over  . CHF (congestive heart failure) ([place]) 08-2011  . CVA 02/19/2010  . CAD 02/19/2010  . DEEP VENOUS THROMBOPHLEBITIS, LEG, LEFT 03/08/2010    in 2009 s/p hospitalization- only 1 isolated dvt  . Pulmonary artery hypertension ([place]) 10/31/2014  . Shortness of breath dyspnea     with exertion  . Depression     years ago  . Dry skin   . Headache   . Dysrhythmia     atrial fibrillation  . 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  acetaminophen (TYLENOL) 500 MG tablet Take 1,000 mg by mouth every 6 (six) hours as needed for moderate pain.    12/23/2015 at Unknown time  . albuterol (PROVENTIL HFA;VENTOLIN HFA) 108 (90 BASE) MCG/ACT inhaler Inhale into the lungs every 4 (four) hours as needed for wheezing.   Past Week at Unknown time  . allopurinol (ZYLOPRIM) 300 MG tablet Take 300 mg by mouth daily.  5 12/24/2015 at Unknown time  . calcium acetate (PHOSLO) 667 MG capsule Take 1,334 mg by mouth 3 (three) times daily with meals.    Past Week at Unknown time  . CINNAMON PO Take 1,000 mg by mouth daily.   12/24/2015 at Unknown time  . ferrous sulfate 325 (65 FE) MG tablet Take 325 mg by mouth daily.   12/24/2015 at Unknown time  . fexofenadine (ALLEGRA) 180 MG tablet Take 180 mg by mouth daily as needed for allergies or rhinitis.   12/23/2015 at Unknown time  . furosemide (LASIX) 80 MG tablet Take 80-240 mg by mouth daily. Takes 3 tabs on Sun, Mon, Wed and Fri  Takes 1 tab all other  days   12/24/2015 at Unknown time  . insulin glargine (LANTUS) 100 UNIT/ML injection Inject 0.05 mLs (5 Units total) into the skin every morning. If blood sugar is >140 add 1 unit. If blood sugar is <140 subtract 1 unit. 10 mL 2 12/24/2015 at Unknown time  . metoprolol succinate (TOPROL-XL) 25 MG 24 hr tablet Take 12.5 mg by mouth 2 (two) times a week. Takes on tues and thurs   Past Week at Unknown time  . midodrine (PROAMATINE) 10 MG tablet Take 10 mg by mouth 2 (two) times daily.    Past Week at Unknown time  . mometasone-formoterol (DULERA) 100-5 MCG/ACT AERO Inhale 2 puffs into the lungs 2 (two) times daily as needed for shortness of breath.   Past Week  . nitroGLYCERIN (NITROSTAT) 0.4 MG SL tablet Place 0.4 mg under the tongue every 5 (five) minutes as needed. For chest pain     Call 911 if pain not relieved by 3rd tab   not used  . omeprazole (PRILOSEC) 20 MG capsule Take 1 capsule (20 mg total) by mouth at bedtime. (Patient taking differently: Take 20 mg  by mouth daily as needed (only takes when eating late in the night). ) 90 capsule 3 Past Week at Unknown time  . Polyethyl Glycol-Propyl Glycol (SYSTANE OP) Apply 1-2 drops to eye 4 (four) times daily as needed. For dry eyes   Past Week at Unknown time  . pravastatin (PRAVACHOL) 40 MG tablet TAKE 1 TABLET BY MOUTH EVERY DAY 90 tablet 3 Past Week at Unknown time  . vitamin B-12 (CYANOCOBALAMIN) 1000 MCG tablet Take 1,000 mcg by mouth every morning.    Past Week at Unknown time  . warfarin (COUMADIN) 5 MG tablet TAKE AS DIRECTED BY COUMADIN CLINIC (Patient taking differently: TAKES 2.5MG ON TUES ONLY, TAKES 5MG ALL OTHER DAYS) 35 tablet 3 Past Week at Unknown time  . Blood Glucose Monitoring Suppl (ACCU-CHEK AVIVA) device Use as instructed 1 each 0 Taking  . glucose blood (ACCU-CHEK AVIVA) test strip Use as instructed 100 each 12 Taking  . Lancet Devices (ACCU-CHEK SOFTCLIX) lancets Use as instructed 100 each 12 Taking  . Lancets Misc. (ACCU-CHEK SOFTCLIX LANCET DEV) KIT 1 strip by Does not apply route 3 (three) times daily. 1 kit 0 Taking  . Omega-3 Fatty Acids (FISH OIL) 1200 MG CAPS Take 2 capsules by mouth 2 (two) times daily.   Taking  . oxyCODONE-acetaminophen (ROXICET) 5-325 MG per tablet Take 1-2 tablets by mouth every 4 (four) hours as needed for severe pain. (Patient not taking: Reported on 10/06/2015) 30 tablet 0 Not Taking  . Tiotropium Bromide Monohydrate 2.5 MCG/ACT AERS Inhale 2 puffs into the lungs daily. 1 Inhaler 11 Taking   Scheduled:  . [START ON 12/25/2015] allopurinol  300 mg Oral Daily  . [START ON 12/25/2015] calcium acetate  1,334 mg Oral TID WC  . mometasone-formoterol  2 puff Inhalation BID  . pravastatin  40 mg Oral Daily  . sodium chloride  3 mL Intravenous Q12H    Assessment: 75 yo female s/p fall. She is noted on coumadin for afib and pharmay has been consulted to dose. -INR= 5.84 -Patient reports a dose of 61m/day except 2.539mon Tu  Home coumadin dose per clinic  notes: 68m82may except 2.68mg72m TuTh. INR was 2.39 at the last clinic visit  Goal of Therapy:  INR 2-3 Monitor platelets by anticoagulation protocol: Yes   Plan:  -Hold coumadin -Daily PT/INR  AndrHildred Laser  Pharm D 12/24/2015 8:31 PM

## 2015-12-24 NOTE — Progress Notes (Signed)
FMTS paged with MRI result from today. Will continue to monitor. Ronnette Hila, RN

## 2015-12-24 NOTE — H&P (Signed)
[facility] Admission History and Physical Service Pager: [phone]  Patient name: Kristi [name] Medical record number: [id number] Date of birth: 06/04/1941 Age: 75 y.o. Gender: female  Primary Care Provider: Georges Lynch, MD Consultants: None Code Status: Full  Chief Complaint: Syncope  Assessment and Plan: Kristi [name] is a 75 y.o. female presenting with multiple syncopal episodes over the past month. PMH is significant for ESRD on dialysis, CAD s/p stent, CHF, DM, COPD, HLD, osteoporosis, Gout, PAH, Afib.   Syncope: Multiple possible etiologies.  History most consistent with orthostatic hypotension (BP 90-100s/40-50s in ED, episode occur with position changes, and reports taking several different pain medications recently due to MSK pains (One Rx was 75 yrs old). DDx includes: Cardiac arrhythmias (known A. Fib and CAD s/p stent and she reports not taking her beta blocker or diuretics for several weeks due to low blood pressures during dialysis); Hypoglycemia (multiple recent hypoglycemic readings and confusion about DM medications); CVA (Left grip weakness, Hx of CVA); Vertigo (reports spinning sensation). Initial Trop neg. CT Head negative. EKG: 1st degree heart block. ECHO 09/2015: Mild LVH, EF 60%, Mod PAH.  - Admit to telemetry; Up with assistance  - Repeat EKG in am - Orthostatics: Ordered. S/p 500cc bolus in ED - Carotid US: Ordered - MRI Brain: Ordered - Consult Cards in AM - PT/OT consults  MSK Pain: Pain likely related to multiple falls. History of osteoporosis and gout.  X-rays of left wrist and left tibia/fibula were Negative for fracture.  Multiple ecchymosis without swelling.  CT head and neck Negative. Left leg pain with no obvious swelling or ecchymosis. Recent x-ray and lower extremity Dopplers negative.  History of DVT in left leg.  - CT Hip: Ordered  - Uric acid: ordered - Norco: 5/325 q6hrs prn - Continue home: Allopurinol 363m qd -  Consider additional evaluation of left leg pain if not improving with rest  Cardiac: HFpEF (09/2015: Mild LVH, EF 60%, Mod PAH), CAD s/p inferior MI and stent in 06/2007, Afib on Warfarin, HTN, HLD, Hx of DVT.  - Holding home: Lasix and Toprol XL. Pt reports wasn't taking these last few weeks - Warfarin per pharm; Holding while INR elevated - Continue Home: Pravastatin [id number]  ESRD: dialysis T, Th, Sa - Continue home phoslo - Consult Renal in AM  DM with retinopathy: Multiple recent hypoglycemic episodes. A1c 5.4 11/2015 - Discontinue Glipizide and Lantus - CBGs qAC  COPD:  - Continue home Dulera BID, albuterol every 4 prn  Social: Lives alone.  Daughters call daily and have a camera in the house.  - PT/OT recs  FEN/GI: Heart healthy; SLIV s/p 500 cc bolus in ED Prophylaxis: Warfarin per pharm for Afib  Disposition: Discharge home versus SNF pending evaluation of syncopal episodes and PT, OT recs  History of Present Illness:  Kristi EICHENBERGERs a [age].o. female presenting with multiple syncopal episodes in the past several weeks.  She reports episodes occur with positional changes, mostly going from sitting to standing.  She experiences room spinning sensations prior to losing consciousness.  Has fallen multiple times, and reports bilateral wrist pain as well as left hip pain and left lower leg pain. She denies any chest pain, shortness of breath, lightheadedness or vision changes prior to syncopal episodes.  She reports history of stroke with no residual symptoms.  History of MI and coronary artery disease with stent placement.  She reports she has not taken her blood pressure or  diuretic medication for several weeks due to noticing low blood pressures during dialysis.  She also reports taking multiple different pain medications due to her muscle skeletal pains related to her falls.  She lives alone but has family check in her the phone calls and Camera in the house.  Additionally, she  reports multiple low blood sugars.  She continues to take Lantus, but said glipizide was discontinued.  Denies history of seizures.   Review Of Systems: Per HPI with the following additions:  Otherwise the remainder of the systems were negative.  Patient Active Problem List   Diagnosis Date Noted  . Syncope 12/24/2015  . Hip pain   . ESRD on dialysis ([place])   . Fall   . Near syncope   . Loss of weight   . Appetite loss   . Constipation   . Wrist pain, acute 12/22/2015  . Pain of left lower leg 12/22/2015  . Encounter for therapeutic drug monitoring 03/17/2015  . Thrombocytopenia ([place])   . Gastric outlet obstruction   . ESRD needing dialysis ([place])   . Atrial fibrillation ([place]) 01/13/2015  . Proliferative diabetic retinopathy ([place]) 12/15/2014  . Pulmonary artery hypertension ([place]) 10/31/2014  . Chronic kidney disease (CKD), stage IV (severe) ([place])   . Acute on chronic diastolic HF (heart failure) ([place])   . Essential hypertension, benign   . Chronic right shoulder pain 09/16/2014  . Coronary atherosclerosis of native coronary artery 04/06/2014  . Old myocardial infarction 04/06/2014  . Gout 04/01/2014  . Chronic back pain 07/21/2013  . CHF (congestive heart failure) ([place]) 11/01/2012  . Diabetic retinopathy associated with type 2 diabetes mellitus ([place]) 09/29/2012  . Osteoporosis 09/11/2012  . Lower extremity edema 09/20/2011  . Obesity 09/20/2011  . History of tobacco abuse 01/11/2011  . MONOCLONAL GAMMOPATHY 07/02/2010  . GERD 03/26/2010  . HYPERLIPIDEMIA 02/19/2010  . COPD (chronic obstructive pulmonary disease) ([place]) 02/19/2010    Past Medical History: Past Medical History  Diagnosis Date  . Hyperlipidemia   . Hyperparathyroidism   . CAD (coronary artery disease)   . Myocardial infarction ([place]) 2008  . Hypertension   . COPD (chronic obstructive pulmonary disease) ([place])   . Diabetes mellitus     diagnosed 2010 - takes oral meds and lantus insulin  . Anemia   .  GERD (gastroesophageal reflux disease)     on omeprazole  . Arthritis     all over  . CHF (congestive heart failure) ([place]) 08-2011  . CVA 02/19/2010  . CAD 02/19/2010  . DEEP VENOUS THROMBOPHLEBITIS, LEG, LEFT 03/08/2010    in 2009 s/p hospitalization- only 1 isolated dvt  . Pulmonary artery hypertension ([place]) 10/31/2014  . Shortness of breath dyspnea     with exertion  . Depression     years ago  . Dry skin   . Headache   . Dysrhythmia     atrial fibrillation  . Chronic kidney disease     esrd - since 2010 - DIALYIS    Past Surgical History: Past Surgical History  Procedure Laterality Date  . Av fistula placement  04/05/11    Left Radiocephalic AVF  . Appendectomy  1969  . Tubal ligation    . Revision of fisturl    . Coronary angioplasty with stent placement  06/23/2007    by Dr. Irish Lack is her cardiologist  . Ligation goretex fistula  10/29/2011    Procedure: LIGATION GORE-TEX FISTULA;  Surgeon: Elam Dutch, MD;  Location: [facility];  Service: Vascular;  Laterality: Left;  Ligation of competing branches left forearm fistula  . Fistulogram N/A 02/17/2012    Procedure: FISTULOGRAM;  Surgeon: Angelia Mould, MD;  [facility] C[facility] LAB;  Service: Cardiovascular;  Laterality: N/A;  . Insertion of dialysis catheter N/A 01/12/2015    Procedure: INSERTION OF DIALYSIS CATHETER;  Surgeon: Conrad Old Ripley, MD;  Location: [facility];  Service: Vascular;  Laterality: N/A;  . Esophagogastroduodenoscopy N/A 01/20/2015    Procedure: ESOPHAGOGASTRODUODENOSCOPY (EGD);  Surgeon: Wonda Horner, MD;  Location[facility] ENDOSCOPY;  Service: Endoscopy;  Laterality: N/A;  . Eye surgery Bilateral     cataract surgery with lens implants  . Tonsillectomy    . Bascilic vein transposition Left 03/24/2015    Procedure: LEFT ARM BASILIC VEIN TRANSPOSITION;  Surgeon: Angelia Mould, MD;  Location: [facility];  Service: Vascular;  Laterality: Left;  . Ligation of arteriovenous  fistula Left 03/24/2015    Procedure:  LIGATION OF left arm radio-cephalic ARTERIOVENOUS  FISTULA;  Surgeon: Angelia Mould, MD;  Location: [facility];  Service: Vascular;  Laterality: Left;  . Revison of arteriovenous fistula Left 07/16/5026    Procedure: PLICATION OF BASILIC VEIN TRANSPOSITION FISTULA;  Surgeon: Angelia Mould, MD;  Location: [facility];  Service: Vascular;  Laterality: Left;   Social History: Social History  Substance Use Topics  . Smoking status: Former Smoker -- 0.50 packs/day for 53 years    Types: Cigarettes    Start date: 12/09/1960    Quit date: 03/31/2014  . Smokeless tobacco: Never Used  . Alcohol Use: 0.0 oz/week    0 Standard drinks or equivalent per week     Comment: occasional   Please also refer to relevant sections of EMR.  Family History: Family History  Problem Relation Age of Onset  . Cancer Mother     multiple myloma  . Dementia Father   . Pneumonia Father   . Anuerysm Father     Allergies and Medications: Allergies  Allergen Reactions  . Losartan Other (See Comments)    Causes increase in creatinine and worsening CKD  . Chantix [Varenicline] Nausea Only and Other (See Comments)    Dizzy and GI symptoms    No current facility-administered medications on file prior to encounter.   Current Outpatient Prescriptions on File Prior to Encounter  Medication Sig Dispense Refill  . acetaminophen (TYLENOL) 500 MG tablet Take 1,000 mg by mouth every 6 (six) hours as needed for moderate pain.     Marland Kitchen albuterol (PROVENTIL HFA;VENTOLIN HFA) 108 (90 BASE) MCG/ACT inhaler Inhale into the lungs every 4 (four) hours as needed for wheezing.    Marland Kitchen allopurinol (ZYLOPRIM) 300 MG tablet Take 300 mg by mouth daily.  5  . calcium acetate (PHOSLO) 667 MG capsule Take 1,334 mg by mouth 3 (three) times daily with meals.     Marland Kitchen CINNAMON PO Take 1,000 mg by mouth daily.    . ferrous sulfate 325 (65 FE) MG tablet Take 325 mg by mouth daily.    . fexofenadine (ALLEGRA) 180 MG tablet Take 180 mg by mouth  daily as needed for allergies or rhinitis.    . furosemide (LASIX) 80 MG tablet Take 80-240 mg by mouth daily. Takes 3 tabs on Sun, Mon, Wed and Fri  Takes 1 tab all other days    . insulin glargine (LANTUS) 100 UNIT/ML injection Inject 0.05 mLs (5 Units total) into the skin every morning. If blood sugar is >140 add 1 unit.  If blood sugar is <140 subtract 1 unit. 10 mL 2  . metoprolol succinate (TOPROL-XL) 25 MG 24 hr tablet Take 12.5 mg by mouth 2 (two) times a week. Takes on tues and thurs    . midodrine (PROAMATINE) 10 MG tablet Take 10 mg by mouth 2 (two) times daily.     . mometasone-formoterol (DULERA) 100-5 MCG/ACT AERO Inhale 2 puffs into the lungs 2 (two) times daily as needed for shortness of breath.    . nitroGLYCERIN (NITROSTAT) 0.4 MG SL tablet Place 0.4 mg under the tongue every 5 (five) minutes as needed. For chest pain     Call 911 if pain not relieved by 3rd tab    . omeprazole (PRILOSEC) 20 MG capsule Take 1 capsule (20 mg total) by mouth at bedtime. (Patient taking differently: Take 20 mg by mouth daily as needed (only takes when eating late in the night). ) 90 capsule 3  . Polyethyl Glycol-Propyl Glycol (SYSTANE OP) Apply 1-2 drops to eye 4 (four) times daily as needed. For dry eyes    . pravastatin (PRAVACHOL) 40 MG tablet TAKE 1 TABLET BY MOUTH EVERY DAY 90 tablet 3  . vitamin B-12 (CYANOCOBALAMIN) 1000 MCG tablet Take 1,000 mcg by mouth every morning.     . warfarin (COUMADIN) 5 MG tablet TAKE AS DIRECTED BY COUMADIN CLINIC (Patient taking differently: TAKES 2.5MG ON TUES ONLY, TAKES 5MG ALL OTHER DAYS) 35 tablet 3  . Blood Glucose Monitoring Suppl (ACCU-CHEK AVIVA) device Use as instructed 1 each 0  . glucose blood (ACCU-CHEK AVIVA) test strip Use as instructed 100 each 12  . Lancet Devices (ACCU-CHEK SOFTCLIX) lancets Use as instructed 100 each 12  . Lancets Misc. (ACCU-CHEK SOFTCLIX LANCET DEV) KIT 1 strip by Does not apply route 3 (three) times daily. 1 kit 0  . Omega-3  Fatty Acids (FISH OIL) 1200 MG CAPS Take 2 capsules by mouth 2 (two) times daily.    Marland Kitchen oxyCODONE-acetaminophen (ROXICET) 5-325 MG per tablet Take 1-2 tablets by mouth every 4 (four) hours as needed for severe pain. (Patient not taking: Reported on 10/06/2015) 30 tablet 0  . Tiotropium Bromide Monohydrate 2.5 MCG/ACT AERS Inhale 2 puffs into the lungs daily. 1 Inhaler 11    Objective: BP 126/52 mmHg  Pulse 78  Temp(Src) 98.2 F (36.8 C) (Oral)  Resp 14  Ht 5' (1.524 m)  Wt 162 lb 4.1 oz (73.6 kg)  BMI 31.69 kg/m2  SpO2 99% Exam: General: NAD: Frail Eyes: PERRLA; EOMI ENTM: MMM Neck: supple, thyroid nonpalpable Cardiovascular: RRR no m/r/g; radial and DP pulses 2+ b/l Respiratory: CTAB; Normal resp effort Abdomen: SNTND MSK: Tenderness to palpation of right wrist, left wrist, left hip, left knee and leg.  No hip pain with internal or external rotation of the legs bilaterally Skin: Multiple ecchymosis on right wrist, left wrist, left hip Neuro: A&Ox3; CN 3-12 intact; Gross sensation intact; Strength 4/5 in left grip otherwise intact Psych: Normal mood and affect  Labs and Imaging: CBC BMET   Recent Labs Lab 12/24/15 1135 12/24/15 1307  WBC 9.3  --   HGB 12.4 13.3  HCT 37.8 39.0  PLT 189  --     Recent Labs Lab 12/24/15 1300 12/24/15 1307  NA 136 135  K 4.6 4.7  CL 94* 95*  CO2 28  --   BUN 14 21*  CREATININE 5.37* 5.20*  GLUCOSE 100* 94  CALCIUM 10.5*  --      Lactic acid: 1.08 CK:  175 INR: 5.85  Dg Chest 2 View  12/24/2015  CLINICAL DATA:  Vertigo.  Fall.  Chronic renal failure on dialysis. EXAM: CHEST  2 VIEW COMPARISON:  01/19/2015 FINDINGS: Mild cardiomegaly is stable. Pulmonary hyperinflation and coarsening of interstitial lung markings is stable. No evidence of acute pulmonary infiltrate or edema. No evidence of pneumothorax or pleural effusion. Right jugular central venous dialysis catheter remains in appropriate position. IMPRESSION: Stable mild  cardiomegaly.  Suspect COPD.  No active lung disease. Electronically Signed   By: Earle Gell M.D.   On: 12/24/2015 11:49   Dg Wrist Complete Left  12/24/2015  CLINICAL DATA:  Became dizzy and fell landing on her elbow, was on floor until 0900 this morning when she was found by her daughter, history dialysis EXAM: LEFT WRIST - COMPLETE 3+ VIEW COMPARISON:  None FINDINGS: Diffuse soft tissue swelling. Surgical clips at the radial aspect of distal forearm question related to dialysis access procedure. Extensive small vessel vascular calcifications. Degenerative changes first CMC joint. No definite acute fracture, dislocation or bone destruction. IMPRESSION: Osseous demineralization with degenerative changes at first [facility] joint. No definite acute bony abnormalities. Electronically Signed   By: Lavonia Dana M.D.   On: 12/24/2015 11:45   Ct Head Wo Contrast  12/24/2015  CLINICAL DATA:  Pt states she became dizzy and fell, was on floor until 9 am. Hit side of head, also c/o neck stiffness Pt on blood thinners Hx of vertigo, dialysis, CVA, CHF, HTN, DM EXAM: CT HEAD WITHOUT CONTRAST CT CERVICAL SPINE WITHOUT CONTRAST TECHNIQUE: Multidetector CT imaging of the head and cervical spine was performed following the standard protocol without intravenous contrast. Multiplanar CT image reconstructions of the cervical spine were also generated. COMPARISON:  CT head 01/16/2010. FINDINGS: CT HEAD FINDINGS No evidence for acute infarction, hemorrhage, mass lesion, hydrocephalus, or extra-axial fluid. Generalized atrophy. Remote LEFT parietal infarct. Generalized hypoattenuation of white matter representing small vessel disease. No skull fracture is evident. There is moderate vascular calcification. No sinus or mastoid air fluid level. BILATERAL cataract extraction. CT CERVICAL SPINE FINDINGS There is no visible cervical spine fracture, traumatic subluxation, prevertebral soft tissue swelling, or intraspinal hematoma. Advanced disc  space narrowing of a chronic nature at C3-4, C5-6, and C6-7. No neck masses. No lung apex lesion. Atherosclerosis. Dual lumen dialysis catheter from a RIGHT IJ approach. No worrisome osseous lesions. IMPRESSION: Atrophy and small vessel disease.  Remote LEFT parietal infarct. No skull fracture or intracranial hemorrhage. Advanced cervical spondylosis.  Extensive atherosclerosis. No cervical spine fracture or traumatic subluxation. Electronically Signed   By: Staci Righter M.D.   On: 12/24/2015 11:17   Ct Cervical Spine Wo Contrast  12/24/2015  CLINICAL DATA:  Pt states she became dizzy and fell, was on floor until 9 am. Hit side of head, also c/o neck stiffness Pt on blood thinners Hx of vertigo, dialysis, CVA, CHF, HTN, DM EXAM: CT HEAD WITHOUT CONTRAST CT CERVICAL SPINE WITHOUT CONTRAST TECHNIQUE: Multidetector CT imaging of the head and cervical spine was performed following the standard protocol without intravenous contrast. Multiplanar CT image reconstructions of the cervical spine were also generated. COMPARISON:  CT head 01/16/2010. FINDINGS: CT HEAD FINDINGS No evidence for acute infarction, hemorrhage, mass lesion, hydrocephalus, or extra-axial fluid. Generalized atrophy. Remote LEFT parietal infarct. Generalized hypoattenuation of white matter representing small vessel disease. No skull fracture is evident. There is moderate vascular calcification. No sinus or mastoid air fluid level. BILATERAL cataract extraction. CT CERVICAL SPINE FINDINGS There is no  visible cervical spine fracture, traumatic subluxation, prevertebral soft tissue swelling, or intraspinal hematoma. Advanced disc space narrowing of a chronic nature at C3-4, C5-6, and C6-7. No neck masses. No lung apex lesion. Atherosclerosis. Dual lumen dialysis catheter from a RIGHT IJ approach. No worrisome osseous lesions. IMPRESSION: Atrophy and small vessel disease.  Remote LEFT parietal infarct. No skull fracture or intracranial hemorrhage.  Advanced cervical spondylosis.  Extensive atherosclerosis. No cervical spine fracture or traumatic subluxation. Electronically Signed   By: Staci Righter M.D.   On: 12/24/2015 11:17   Dg Hip Unilat With Pelvis 2-3 Views Left  12/24/2015  CLINICAL DATA:  Vertigo.  Fall. EXAM: DG HIP (WITH OR WITHOUT PELVIS) 2-3V LEFT COMPARISON:  None. FINDINGS: Osteopenia. No acute fracture. No dislocation. Prominent vascular calcifications. Mild degenerative change in the hip joints. IMPRESSION: No acute bony pathology. Electronically Signed   By: Marybelle Killings M.D.   On: 12/24/2015 11:47   Olam Idler, MD 12/24/2015, 8:09 PM PGY-3, [place] [facility] pager: [phone], text pages welcome

## 2015-12-24 NOTE — ED Provider Notes (Signed)
CSN: [id number]     Arrival date & time 12/24/15  1012 History   First MD Initiated Contact with Patient 12/24/15 1021     Chief Complaint  Patient presents with  . Fall    blood thinners     (Consider location/radiation/quality/duration/timing/severity/associated sxs/prior Treatment) Patient is a 75 y.o. female presenting with fall. The history is provided by the patient (The patient states that she fell Saturday morning when she became dizzy and was unable to get up and had paramedics help her up. She also had a fall on Thursday and then she fell again yesterday evening. She was dizzy once again fell and was unable to get ).  Fall This is a new problem. The current episode started 12 to 24 hours ago. The problem occurs constantly. The problem has not changed since onset.Pertinent negatives include no chest pain, no abdominal pain and no headaches. Nothing aggravates the symptoms. Nothing relieves the symptoms.    Past Medical History  Diagnosis Date  . Hyperlipidemia   . Hyperparathyroidism   . CAD (coronary artery disease)   . Myocardial infarction ([place]) 2008  . Hypertension   . COPD (chronic obstructive pulmonary disease) ([place])   . Diabetes mellitus     diagnosed 2010 - takes oral meds and lantus insulin  . Anemia   . GERD (gastroesophageal reflux disease)     on omeprazole  . Arthritis     all over  . CHF (congestive heart failure) ([place]) 08-2011  . CVA 02/19/2010  . CAD 02/19/2010  . DEEP VENOUS THROMBOPHLEBITIS, LEG, LEFT 03/08/2010    in 2009 s/p hospitalization- only 1 isolated dvt  . Pulmonary artery hypertension ([place]) 10/31/2014  . Shortness of breath dyspnea     with exertion  . Depression     years ago  . Dry skin   . Headache   . Dysrhythmia     atrial fibrillation  . Chronic kidney disease     esrd - since 2010 - DIALYIS    Past Surgical History  Procedure Laterality Date  . Av fistula placement  04/05/11    Left Radiocephalic AVF  . Appendectomy  1969   . Tubal ligation    . Revision of fisturl    . Coronary angioplasty with stent placement  06/23/2007    by Dr. Irish Lack is her cardiologist  . Ligation goretex fistula  10/29/2011    Procedure: LIGATION GORE-TEX FISTULA;  Surgeon: Elam Dutch, MD;  Location: [facility] OR;  Service: Vascular;  Laterality: Left;  Ligation of competing branches left forearm fistula  . Fistulogram N/A 02/17/2012    Procedure: FISTULOGRAM;  Surgeon: Angelia Mould, MD;  [facility] C[facility] LAB;  Service: Cardiovascular;  Laterality: N/A;  . Insertion of dialysis catheter N/A 01/12/2015    Procedure: INSERTION OF DIALYSIS CATHETER;  Surgeon: Conrad Benjamin, MD;  Location: [place];  Service: Vascular;  Laterality: N/A;  . Esophagogastroduodenoscopy N/A 01/20/2015    Procedure: ESOPHAGOGASTRODUODENOSCOPY (EGD);  Surgeon: Wonda Horner, MD;  Location: [facility] ENDOSCOPY;  Service: Endoscopy;  Laterality: N/A;  . Eye surgery Bilateral     cataract surgery with lens implants  . Tonsillectomy    . Bascilic vein transposition Left 03/24/2015    Procedure: LEFT ARM BASILIC VEIN TRANSPOSITION;  Surgeon: Angelia Mould, MD;  Location: [place];  Service: Vascular;  Laterality: Left;  . Ligation of arteriovenous  fistula Left 03/24/2015    Procedure: LIGATION OF left arm radio-cephalic ARTERIOVENOUS  FISTULA;  Surgeon: Angelia Mould, MD;  Location: [place];  Service: Vascular;  Laterality: Left;  . Revison of arteriovenous fistula Left 02/07/5944    Procedure: PLICATION OF BASILIC VEIN TRANSPOSITION FISTULA;  Surgeon: Angelia Mould, MD;  Location: [facility] OR;  Service: Vascular;  Laterality: Left;   Family History  Problem Relation Age of Onset  . Cancer Mother     multiple myloma  . Dementia Father   . Pneumonia Father   . Anuerysm Father    Social History  Substance Use Topics  . Smoking status: Former Smoker -- 0.50 packs/day for 53 years    Types: Cigarettes    Start date: 12/09/1960    Quit date: 03/31/2014  .  Smokeless tobacco: Never Used  . Alcohol Use: 0.0 oz/week    0 Standard drinks or equivalent per week     Comment: occasional   OB History    No data available     Review of Systems  Constitutional: Negative for appetite change and fatigue.  HENT: Negative for congestion, ear discharge and sinus pressure.   Eyes: Negative for discharge.  Respiratory: Negative for cough.   Cardiovascular: Negative for chest pain.  Gastrointestinal: Negative for abdominal pain and diarrhea.  Genitourinary: Negative for frequency and hematuria.  Musculoskeletal: Negative for back pain.  Skin: Negative for rash.  Neurological: Positive for dizziness. Negative for seizures and headaches.  Psychiatric/Behavioral: Negative for hallucinations.      Allergies  Losartan and Chantix  Home Medications   Prior to Admission medications   Medication Sig Start Date End Date Taking? Authorizing Provider  ACCU-CHEK SOFTCLIX LANCETS lancets  05/24/15   Historical Provider, MD  acetaminophen (TYLENOL) 500 MG tablet Take 1,000 mg by mouth every 6 (six) hours as needed.    Historical Provider, MD  albuterol (PROVENTIL HFA;VENTOLIN HFA) 108 (90 BASE) MCG/ACT inhaler Inhale into the lungs every 4 (four) hours as needed for wheezing.    Historical Provider, MD  allopurinol (ZYLOPRIM) 300 MG tablet Take 300 mg by mouth daily. 12/29/14   Historical Provider, MD  Blood Glucose Monitoring Suppl (ACCU-CHEK AVIVA PLUS) W/DEVICE KIT  05/24/15   Historical Provider, MD  Blood Glucose Monitoring Suppl (ACCU-CHEK AVIVA) device Use as instructed 05/24/15 05/23/16  Elberta Leatherwood, MD  calcium acetate (PHOSLO) 667 MG capsule Take 667 mg by mouth 3 (three) times daily with meals.    Historical Provider, MD  CINNAMON PO Take 1,000 mg by mouth daily.    Historical Provider, MD  ferrous sulfate 325 (65 FE) MG tablet Take 325 mg by mouth daily.    Historical Provider, MD  fexofenadine (ALLEGRA) 180 MG tablet Take 180 mg by mouth daily as  needed for allergies or rhinitis.    Historical Provider, MD  furosemide (LASIX) 80 MG tablet Take 80 mg by mouth 3 (three) times daily as needed for fluid.    Historical Provider, MD  glucosamine-chondroitin 500-400 MG tablet Take 1 tablet by mouth 2 (two) times daily.      Historical Provider, MD  glucose blood (ACCU-CHEK AVIVA) test strip Use as instructed 05/24/15   Elberta Leatherwood, MD  insulin glargine (LANTUS) 100 UNIT/ML injection Inject 0.05 mLs (5 Units total) into the skin every morning. If blood sugar is >140 add 1 unit. If blood sugar is <140 subtract 1 unit. 11/20/15   Elberta Leatherwood, MD  Lancet Devices [facility]) lancets Use as instructed 05/24/15   Elberta Leatherwood, MD  Lancets [facility]isc. [facility]  LANCET DEV) KIT 1 strip by Does not apply route 3 (three) times daily. 05/24/15   Elberta Leatherwood, MD  metoprolol succinate (TOPROL-XL) 25 MG 24 hr tablet Take 12.5 mg by mouth 2 (two) times a week. 07/19/15   Historical Provider, MD  midodrine (PROAMATINE) 10 MG tablet Take 10 mg by mouth 2 (two) times daily.     Historical Provider, MD  mometasone-formoterol (DULERA) 100-5 MCG/ACT AERO Inhale 2 puffs into the lungs 2 (two) times daily as needed for shortness of breath.    Historical Provider, MD  nitroGLYCERIN (NITROSTAT) 0.4 MG SL tablet Place 0.4 mg under the tongue every 5 (five) minutes as needed. For chest pain     Call 911 if pain not relieved by 3rd tab    Historical Provider, MD  Omega-3 Fatty Acids (FISH OIL) 1200 MG CAPS Take 2 capsules by mouth 2 (two) times daily.    Historical Provider, MD  omeprazole (PRILOSEC) 20 MG capsule Take 1 capsule (20 mg total) by mouth at bedtime. 06/13/15   Elberta Leatherwood, MD  oxyCODONE-acetaminophen (ROXICET) 5-325 MG per tablet Take 1-2 tablets by mouth every 4 (four) hours as needed for severe pain. Patient not taking: Reported on 10/06/2015 08/11/15   Angelia Mould, MD  Polyethyl Glycol-Propyl Glycol (SYSTANE OP) Apply 1-2 drops to eye 4 (four)  times daily as needed. For dry eyes    Historical Provider, MD  pravastatin (PRAVACHOL) 40 MG tablet TAKE 1 TABLET BY MOUTH EVERY DAY 09/08/15   Elberta Leatherwood, MD  Tiotropium Bromide Monohydrate 2.5 MCG/ACT AERS Inhale 2 puffs into the lungs daily. 10/06/15   Zenia Resides, MD  vitamin B-12 (CYANOCOBALAMIN) 1000 MCG tablet Take 1,000 mcg by mouth every morning.     Historical Provider, MD  warfarin (COUMADIN) 5 MG tablet TAKE AS DIRECTED BY COUMADIN CLINIC 11/10/15   Jettie Booze, MD   BP 100/47 mmHg  Pulse 77  Resp 14  SpO2 92% Physical Exam  Constitutional: She is oriented to person, place, and time. She appears well-developed.  HENT:  Head: Normocephalic.  Eyes: Conjunctivae and EOM are normal. No scleral icterus.  Neck: Neck supple. No thyromegaly present.  Cardiovascular: Normal rate and regular rhythm.  Exam reveals no gallop and no friction rub.   No murmur heard. Pulmonary/Chest: No stridor. She has no wheezes. She has no rales. She exhibits no tenderness.  Abdominal: She exhibits no distension. There is no tenderness. There is no rebound.  Musculoskeletal: Normal range of motion. She exhibits no edema.  Patient has tenderness to left hip. But she has full range of motion  Lymphadenopathy:    She has no cervical adenopathy.  Neurological: She is oriented to person, place, and time. She exhibits normal muscle tone. Coordination normal.  Skin: No rash noted. No erythema.  Psychiatric: She has a normal mood and affect. Her behavior is normal.    ED Course  Procedures (including critical care time) Labs Review Labs Reviewed  CBC WITH DIFFERENTIAL/PLATELET - Abnormal; Notable for the following:    RBC 3.62 (*)    MCV 104.4 (*)    MCH 34.3 (*)    All other components within normal limits  PROTIME-INR - Abnormal; Notable for the following:    Prothrombin Time >50.0 (*)    INR 5.84 (*)    All other components within normal limits  COMPREHENSIVE METABOLIC PANEL -  Abnormal; Notable for the following:    Chloride 94 (*)  Glucose, Bld 100 (*)    Creatinine, Ser 5.37 (*)    Calcium 10.5 (*)    Total Protein 5.7 (*)    Albumin 2.4 (*)    ALT 7 (*)    GFR calc non Af Amer 7 (*)    GFR calc Af Amer 8 (*)    All other components within normal limits  I-STAT CHEM 8, ED - Abnormal; Notable for the following:    Chloride 95 (*)    BUN 21 (*)    Creatinine, Ser 5.20 (*)    All other components within normal limits  CK  I-STAT TROPOININ, ED  CBG MONITORING, ED  I-STAT CG4 LACTIC ACID, ED  TYPE AND SCREEN    Imaging Review Dg Chest 2 View  12/24/2015  CLINICAL DATA:  Vertigo.  Fall.  Chronic renal failure on dialysis. EXAM: CHEST  2 VIEW COMPARISON:  01/19/2015 FINDINGS: Mild cardiomegaly is stable. Pulmonary hyperinflation and coarsening of interstitial lung markings is stable. No evidence of acute pulmonary infiltrate or edema. No evidence of pneumothorax or pleural effusion. Right jugular central venous dialysis catheter remains in appropriate position. IMPRESSION: Stable mild cardiomegaly.  Suspect COPD.  No active lung disease. Electronically Signed   By: Earle Gell M.D.   On: 12/24/2015 11:49   Dg Wrist Complete Left  12/24/2015  CLINICAL DATA:  Became dizzy and fell landing on her elbow, was on floor until 0900 this morning when she was found by her daughter, history dialysis EXAM: LEFT WRIST - COMPLETE 3+ VIEW COMPARISON:  None FINDINGS: Diffuse soft tissue swelling. Surgical clips at the radial aspect of distal forearm question related to dialysis access procedure. Extensive small vessel vascular calcifications. Degenerative changes first CMC joint. No definite acute fracture, dislocation or bone destruction. IMPRESSION: Osseous demineralization with degenerative changes at first [facility] joint. No definite acute bony abnormalities. Electronically Signed   By: Lavonia Dana M.D.   On: 12/24/2015 11:45   Dg Wrist Complete Left  12/22/2015  CLINICAL DATA:   Left wrist pain and swelling after fall in home 1 week ago. Initial encounter. EXAM: LEFT WRIST - COMPLETE 3+ VIEW COMPARISON:  None. FINDINGS: There is no evidence of fracture or dislocation. Osteoarthritis of the first carpometacarpal joint is noted. Vascular calcifications are noted. IMPRESSION: No acute abnormality seen in the left wrist. Osteoarthritis of first carpometacarpal joint is noted. Electronically Signed   By: Marijo Conception, M.D.   On: 12/22/2015 17:30   Dg Tibia/fibula Left  12/22/2015  CLINICAL DATA:  Pain after fall 1 week prior EXAM: LEFT TIBIA AND FIBULA - 2 VIEW COMPARISON:  None. FINDINGS: Frontal and lateral views were obtained. There is no demonstrable fracture or dislocation. The joint spaces appear intact. There is arterial vascular calcification. No abnormal periosteal reaction. IMPRESSION: No fracture or dislocation.  No appreciable arthropathy. Electronically Signed   By: Lowella Grip III M.D.   On: 12/22/2015 17:29   Ct Head Wo Contrast  12/24/2015  CLINICAL DATA:  Pt states she became dizzy and fell, was on floor until 9 am. Hit side of head, also c/o neck stiffness Pt on blood thinners Hx of vertigo, dialysis, CVA, CHF, HTN, DM EXAM: CT HEAD WITHOUT CONTRAST CT CERVICAL SPINE WITHOUT CONTRAST TECHNIQUE: Multidetector CT imaging of the head and cervical spine was performed following the standard protocol without intravenous contrast. Multiplanar CT image reconstructions of the cervical spine were also generated. COMPARISON:  CT head 01/16/2010. FINDINGS: CT HEAD FINDINGS No evidence for  acute infarction, hemorrhage, mass lesion, hydrocephalus, or extra-axial fluid. Generalized atrophy. Remote LEFT parietal infarct. Generalized hypoattenuation of white matter representing small vessel disease. No skull fracture is evident. There is moderate vascular calcification. No sinus or mastoid air fluid level. BILATERAL cataract extraction. CT CERVICAL SPINE FINDINGS There is no  visible cervical spine fracture, traumatic subluxation, prevertebral soft tissue swelling, or intraspinal hematoma. Advanced disc space narrowing of a chronic nature at C3-4, C5-6, and C6-7. No neck masses. No lung apex lesion. Atherosclerosis. Dual lumen dialysis catheter from a RIGHT IJ approach. No worrisome osseous lesions. IMPRESSION: Atrophy and small vessel disease.  Remote LEFT parietal infarct. No skull fracture or intracranial hemorrhage. Advanced cervical spondylosis.  Extensive atherosclerosis. No cervical spine fracture or traumatic subluxation. Electronically Signed   By: Staci Righter M.D.   On: 12/24/2015 11:17   Ct Cervical Spine Wo Contrast  12/24/2015  CLINICAL DATA:  Pt states she became dizzy and fell, was on floor until 9 am. Hit side of head, also c/o neck stiffness Pt on blood thinners Hx of vertigo, dialysis, CVA, CHF, HTN, DM EXAM: CT HEAD WITHOUT CONTRAST CT CERVICAL SPINE WITHOUT CONTRAST TECHNIQUE: Multidetector CT imaging of the head and cervical spine was performed following the standard protocol without intravenous contrast. Multiplanar CT image reconstructions of the cervical spine were also generated. COMPARISON:  CT head 01/16/2010. FINDINGS: CT HEAD FINDINGS No evidence for acute infarction, hemorrhage, mass lesion, hydrocephalus, or extra-axial fluid. Generalized atrophy. Remote LEFT parietal infarct. Generalized hypoattenuation of white matter representing small vessel disease. No skull fracture is evident. There is moderate vascular calcification. No sinus or mastoid air fluid level. BILATERAL cataract extraction. CT CERVICAL SPINE FINDINGS There is no visible cervical spine fracture, traumatic subluxation, prevertebral soft tissue swelling, or intraspinal hematoma. Advanced disc space narrowing of a chronic nature at C3-4, C5-6, and C6-7. No neck masses. No lung apex lesion. Atherosclerosis. Dual lumen dialysis catheter from a RIGHT IJ approach. No worrisome osseous  lesions. IMPRESSION: Atrophy and small vessel disease.  Remote LEFT parietal infarct. No skull fracture or intracranial hemorrhage. Advanced cervical spondylosis.  Extensive atherosclerosis. No cervical spine fracture or traumatic subluxation. Electronically Signed   By: Staci Righter M.D.   On: 12/24/2015 11:17   Dg Hip Unilat With Pelvis 2-3 Views Left  12/24/2015  CLINICAL DATA:  Vertigo.  Fall. EXAM: DG HIP (WITH OR WITHOUT PELVIS) 2-3V LEFT COMPARISON:  None. FINDINGS: Osteopenia. No acute fracture. No dislocation. Prominent vascular calcifications. Mild degenerative change in the hip joints. IMPRESSION: No acute bony pathology. Electronically Signed   By: Marybelle Killings M.D.   On: 12/24/2015 11:47   I have personally reviewed and evaluated these images and lab results as part of my medical decision-making.   EKG Interpretation   Date/Time:  Sunday December 24 2015 10:24:52 EST Ventricular Rate:  75 PR Interval:  205 QRS Duration: 97 QT Interval:  382 QTC Calculation: 427 R Axis:   84 Text Interpretation:  Sinus or ectopic atrial rhythm Borderline right axis  deviation Nonspecific repol abnormality, diffuse leads Confirmed by Kadeshia Kasparian   MD, Kashayla Ungerer [phone]) on 12/24/2015 12:54:33 PM Also confirmed by Marylon Verno  MD,  Broadus John [phone])  on 12/24/2015 1:42:56 PM      MDM   Final diagnoses:  Near syncope    Patient with numerous syncopal episodes and falls. Possibly related to hypovolemia from the dialysis. Labs unremarkable except EKG does show some worsening of her T-wave patient will be admitted for syncope  Milton Ferguson, MD 12/24/15 [phone]

## 2015-12-24 NOTE — Progress Notes (Signed)
Neurology to bedside to see patient, pt has had no trouble swallowing tonight, per MD ok to give lipitor and ASA

## 2015-12-25 ENCOUNTER — Inpatient Hospital Stay (HOSPITAL_COMMUNITY): Payer: Commercial Managed Care - HMO

## 2015-12-25 ENCOUNTER — Ambulatory Visit (HOSPITAL_COMMUNITY): Payer: Commercial Managed Care - HMO

## 2015-12-25 ENCOUNTER — Other Ambulatory Visit: Payer: Self-pay

## 2015-12-25 DIAGNOSIS — I5022 Chronic systolic (congestive) heart failure: Secondary | ICD-10-CM | POA: Insufficient documentation

## 2015-12-25 DIAGNOSIS — R55 Syncope and collapse: Secondary | ICD-10-CM

## 2015-12-25 DIAGNOSIS — I951 Orthostatic hypotension: Secondary | ICD-10-CM | POA: Insufficient documentation

## 2015-12-25 DIAGNOSIS — I69354 Hemiplegia and hemiparesis following cerebral infarction affecting left non-dominant side: Secondary | ICD-10-CM

## 2015-12-25 DIAGNOSIS — I2510 Atherosclerotic heart disease of native coronary artery without angina pectoris: Secondary | ICD-10-CM

## 2015-12-25 DIAGNOSIS — I10 Essential (primary) hypertension: Secondary | ICD-10-CM

## 2015-12-25 DIAGNOSIS — D539 Nutritional anemia, unspecified: Secondary | ICD-10-CM | POA: Insufficient documentation

## 2015-12-25 DIAGNOSIS — I480 Paroxysmal atrial fibrillation: Secondary | ICD-10-CM

## 2015-12-25 DIAGNOSIS — Z8673 Personal history of transient ischemic attack (TIA), and cerebral infarction without residual deficits: Secondary | ICD-10-CM | POA: Insufficient documentation

## 2015-12-25 DIAGNOSIS — N186 End stage renal disease: Secondary | ICD-10-CM

## 2015-12-25 DIAGNOSIS — M25559 Pain in unspecified hip: Secondary | ICD-10-CM

## 2015-12-25 DIAGNOSIS — J449 Chronic obstructive pulmonary disease, unspecified: Secondary | ICD-10-CM | POA: Insufficient documentation

## 2015-12-25 DIAGNOSIS — Z992 Dependence on renal dialysis: Secondary | ICD-10-CM

## 2015-12-25 DIAGNOSIS — I639 Cerebral infarction, unspecified: Secondary | ICD-10-CM | POA: Insufficient documentation

## 2015-12-25 DIAGNOSIS — W19XXXD Unspecified fall, subsequent encounter: Secondary | ICD-10-CM

## 2015-12-25 DIAGNOSIS — I6789 Other cerebrovascular disease: Secondary | ICD-10-CM

## 2015-12-25 LAB — BASIC METABOLIC PANEL
Anion gap: 12 (ref 5–15)
BUN: 18 mg/dL (ref 6–20)
CALCIUM: 10.2 mg/dL (ref 8.9–10.3)
CO2: 27 mmol/L (ref 22–32)
CREATININE: 6.79 mg/dL — AB (ref 0.44–1.00)
Chloride: 98 mmol/L — ABNORMAL LOW (ref 101–111)
GFR calc Af Amer: 6 mL/min — ABNORMAL LOW (ref >60)
GFR, EST NON AFRICAN AMERICAN: 5 mL/min — AB (ref >60)
GLUCOSE: 122 mg/dL — AB (ref 65–99)
Potassium: 3.7 mmol/L (ref 3.5–5.1)
Sodium: 137 mmol/L (ref 135–145)

## 2015-12-25 LAB — GLUCOSE, CAPILLARY
Glucose-Capillary: 95 mg/dL (ref 65–99)
Glucose-Capillary: 147 mg/dL — ABNORMAL HIGH (ref 65–99)
Glucose-Capillary: 200 mg/dL — ABNORMAL HIGH (ref 65–99)

## 2015-12-25 LAB — URIC ACID: Uric Acid, Serum: 3.7 mg/dL (ref 2.3–6.6)

## 2015-12-25 LAB — CBC
HCT: 34.2 % — ABNORMAL LOW (ref 36.0–46.0)
Hemoglobin: 11 g/dL — ABNORMAL LOW (ref 12.0–15.0)
MCH: 34 pg (ref 26.0–34.0)
MCHC: 32.2 g/dL (ref 30.0–36.0)
MCV: 105.6 fL — ABNORMAL HIGH (ref 78.0–100.0)
Platelets: 193 10*3/uL (ref 150–400)
RBC: 3.24 MIL/uL — ABNORMAL LOW (ref 3.87–5.11)
RDW: 14.7 % (ref 11.5–15.5)
WBC: 6.9 10*3/uL (ref 4.0–10.5)

## 2015-12-25 LAB — PROTIME-INR
INR: 5.59 — AB (ref 0.00–1.49)
PROTHROMBIN TIME: 48.8 s — AB (ref 11.6–15.2)

## 2015-12-25 MED ORDER — CALCIUM ACETATE (PHOS BINDER) 667 MG PO CAPS
667.0000 mg | ORAL_CAPSULE | Freq: Three times a day (TID) | ORAL | Status: DC
  Administered 2015-12-25: 667 mg via ORAL
  Filled 2015-12-25: qty 1

## 2015-12-25 MED ORDER — MIDODRINE HCL 5 MG PO TABS
10.0000 mg | ORAL_TABLET | Freq: Two times a day (BID) | ORAL | Status: DC
  Administered 2015-12-25: 10 mg via ORAL
  Filled 2015-12-25: qty 2

## 2015-12-25 NOTE — Progress Notes (Signed)
STROKE TEAM PROGRESS NOTE   HISTORY Kristi [name] is an 75 y.o. female with hx of ESRD on HD, CAD, CHF, A fib, DM, HLD admitted with multiple syncopal episodes over the past month. On admission she was found to have LUE weakness so a MRI brain was ordered. Patient reports her left arm has been weak for around 1-2 weeks. She has a history of A fib for which she is on coumadin. INR on admission was 5.64. MRI brain imaging reviewed, shows small right basal ganglia infarct, question of right M1 stenosis and occlusion/slow flow of left vertebral artery. Her last known well was unclear. Modified Rankin: Rankin Score=1. Patient was not administered TPA secondary to unclear last known well. She was admitted for further evaluation and treatment.   SUBJECTIVE (INTERVAL HISTORY) No family is at the bedside.  Overall she feels her condition is stable. She reports she still has difficulty thinking of words. Complains of pain since fall yesterday. Hip xray neg for fx.   OBJECTIVE Temp:  [97.4 F (36.3 C)-98.2 F (36.8 C)] 97.4 F (36.3 C) (01/16 1139) Pulse Rate:  [73-80] 76 (01/16 1139) Cardiac Rhythm:  [-] Normal sinus rhythm;Bundle branch block (01/16 0700) Resp:  [12-19] 16 (01/16 1139) BP: (90-126)/(36-52) 115/45 mmHg (01/16 1139) SpO2:  [87 %-99 %] 95 % (01/16 1139) Weight:  [72.9 kg (160 lb 11.5 oz)-73.6 kg (162 lb 4.1 oz)] 72.9 kg (160 lb 11.5 oz) (01/16 0431)  CBC:   Recent Labs Lab 12/24/15 1135 12/24/15 1307 12/25/15 0344  WBC 9.3  --  6.9  NEUTROABS 6.9  --   --   HGB 12.4 13.3 11.0*  HCT 37.8 39.0 34.2*  MCV 104.4*  --  105.6*  PLT 189  --  0000000    Basic Metabolic Panel:   Recent Labs Lab 12/24/15 1300 12/24/15 1307 12/25/15 0344  NA 136 135 137  K 4.6 4.7 3.7  CL 94* 95* 98*  CO2 28  --  27  GLUCOSE 100* 94 122*  BUN 14 21* 18  CREATININE 5.37* 5.20* 6.79*  CALCIUM 10.5*  --  10.2    Lipid Panel:     Component Value Date/Time   CHOL 157 09/30/2014 0827   CHOL  164 04/05/2013   TRIG 166* 09/30/2014 0827   TRIG 39 04/05/2013   HDL 32* 09/30/2014 0827   CHOLHDL 4.9 09/30/2014 0827   VLDL 33 09/30/2014 0827   LDLCALC 92 09/30/2014 0827   LDLCALC 98 04/05/2013   HgbA1c:  Lab Results  Component Value Date   HGBA1C 5.4 11/20/2015   Urine Drug Screen: No results found for: LABOPIA, COCAINSCRNUR, LABBENZ, AMPHETMU, THCU, LABBARB    IMAGING  Dg Chest 2 View 12/24/2015  Stable mild cardiomegaly.  Suspect COPD.  No active lung disease.   Dg Wrist Complete Left 12/24/2015  Osseous demineralization with degenerative changes at first [facility] joint. No definite acute bony abnormalities.   Ct Head Wo Contrast 12/24/2015  Atrophy and small vessel disease.  Remote LEFT parietal infarct. No skull fracture or intracranial hemorrhage. Advanced cervical spondylosis.  Extensive atherosclerosis.   Ct Cervical Spine Wo Contrast 12/24/2015  No cervical spine fracture or traumatic subluxation.   Mri Brain Without Contrast 12/24/2015  Possibly stenotic distal RIGHT M1 segment and, possible slow flow or occlusion LEFT vertebral artery ; findings would be better characterized on MRA of the head.  Subcentimeter acute ischemia RIGHT corona radiata/ basal ganglia. LEFT parietal occipital encephalomalacia suggests watershed infarct or old trauma.  Small area LEFT frontal lobe encephalomalacia. Moderate chronic small vessel ischemic disease. Old bilateral basal ganglia and thalamus lacunar infarcts. Old small RIGHT cerebellar infarcts. Nonvisualized LEFT vertebral artery flow void, this could represent RIGHT dominance or, slow flow/occlusion.   Dg Hip Unilat With Pelvis 2-3 Views Left 12/24/2015  No acute bony pathology.       PHYSICAL EXAM Pleasant elderly lady not in distress. . Afebrile. Head is nontraumatic. Neck is supple without bruit.    Cardiac exam no murmur or gallop. Lungs are clear to auscultation. Distal pulses are well felt. Neurological Exam :  Awake alert  oriented x 3 normal speech and language. Mild left lower face asymmetry. Tongue midline. Mild left sided drift with 4/5 left hemiparesis. Mild diminished fine finger movements on left. Orbits right over left upper extremity. Mild left grip weak.. Normal sensation . Normal coordination. ASSESSMENT/PLAN Kristi [name] is a 75 y.o. female with history of ESRD on HD, CAD, CHF, A fib, DM, HLD, hx DVT, multiple falls presenting with left upper extremity weakness. She did not receive IV t-PA due to unknown last known well.   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 Left occipital infarct. Possibly stenotic right M1  MRA  Not ordered. Can do TCD to look at baseline vasculature (instead of MRA that is typically done at the same time as MRI)  Carotid Doppler  Pending   2D Echo  No source of embolus 09/27/2015. No need to repeat from stroke standpoint.  LDL Recommend LDL to be checked  HgbA1c 5.4 in December   Warfarin for VTE prophylaxis Diet renal/carb modified with fluid restriction Diet-HS Snack?: Nothing; Room service appropriate?: Yes; Fluid consistency:: Thin  warfarin daily prior to admission, now on warfarin daily. Do not recommend additional antiplatelets. NOACs cannot be used in renal failure  Patient counseled to be compliant with her antithrombotic medications  Ongoing aggressive stroke risk factor management  Therapy recommendations:  pending   Disposition:  pending  (lives alone, daughter skull daily, have a camera in the house)  Atrial Fibrillation  Home anticoagulation:  warfarin continued in the hospital  INR 5.64 on admission. Warfarin currently on hold. INR today 5.59  Continue warfarin at discharge   Hyperlipidemia  Home meds:  Pravachol 40, changed to  Lipitor in hospital  LDL not ordered, goal < 70  Recommend LDL to be checked  Continue statin at discharge  Diabetes type II  HgbA1c 5.4 in December, goal < 7.0  Controlled  Other Stroke Risk Factors  Advanced age  Former Cigarette smoker, quit smoking 20 mos ago   ETOH use  Obesity, Body mass index is 31.39 kg/(m^2).   Hx stroke/TIA  02/2010 (details not available in   Coronary artery disease - MI, angioplasty with stenting in 2008  CHF  Other Active Problems  ESRD on HD  Syncope with multiple episodes over the last several weeks  Musculoskeletal pain  COPD  DM with retinopathy  Hospital day # [place] for Pager information 12/25/2015 12:28 PM  I have personally examined this patient, reviewed notes, independently viewed imaging studies, participated in medical decision making and plan of care. I have made any additions or clarifications directly to the above note. Agree with note above. She presented with dizziness leading to  a fall and MRI scan shows bilateral subcortical embolic infarcts from atrial fibrillation despite anticoagulation with warfarin and INR of 5.4. She  remains at risk for neurological worsening, recurrent stroke, TIA, hemorrhage and needs ongoing evaluation. I had long discussion with the patient regarding treatment options for anticoagulation with warfarin seem to be our only choice due to her renal failure the newer anticoagulants are not safe.  Antony Contras, MD Medical Director [facility] Pager: [phone] 12/25/2015 4:12 PM    To contact Stroke Continuity provider, please refer to [email or web address] After hours, contact General Neurology

## 2015-12-25 NOTE — Progress Notes (Signed)
Utilization review completed. Randi Poullard, RN, BSN. 

## 2015-12-25 NOTE — Evaluation (Signed)
Physical Therapy Evaluation Patient Details Name: Kristi [name] MRN: [id number] DOB: [birth year] Today's Date: 12/25/2015   History of Present Illness  75 y.o. female with hx of ESRD on HD, Chronic back painand right shoulder pain, osteoporosis, COPD, arthritis, CAD, CHF, A fib, DM, HLD admitted with multiple syncopal episodes over the past month. On admission she was found to have LUE weakness so a MRI brain was ordered. Patient reports her left arm has been weak for around 1-2 weeks. She has a history of A fib for which she is on coumadin. INR on admission was 5.64. MRI brain imaging reviewed, shows small right basal ganglia infarct, question of right M1 stenosis and occlusion/slow flow of left vertebral artery, LEFT parietal occipital encephalomalacia suggests watershed infarct or old trauma. Small area LEFT frontal lobe encephalomalacia. 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  Clinical Impression  Patient presents with decreased mobility due to deficits listed in PT problem list.  She also demonstrates decreased memory, word finding deficits and will benefit from interdisciplinary skilled therapies at CIR level rehab.  Will continue skilled PT in the acute setting to allow maximal mobility and cognitive remediation.      Follow Up Recommendations CIR;Supervision/Assistance - 24 hour    Equipment Recommendations  None recommended by PT    Recommendations for Other Services       Precautions / Restrictions Precautions Precautions: Fall      Mobility  Bed Mobility Overal bed mobility: Needs Assistance Bed Mobility: Sit to Supine       Sit to supine: Min assist   General bed mobility comments: assist for positioning and for lifting legs into bed  Transfers Overall transfer level: Needs assistance   Transfers: Sit to/from Stand Sit to Stand: Mod assist         General transfer comment: up from EOB cues for hand placement, pt fatigued and difficulty attempting on her  own  Ambulation/Gait Ambulation/Gait assistance: Min assist;Mod assist Ambulation Distance (Feet): 2 Feet Assistive device: Rolling walker (2 wheeled) Gait Pattern/deviations: Step-to pattern;Wide base of support     General Gait Details: side steps up to [facility] with walker, assist for lateral weight shift for moving feet and walker  Stairs            Wheelchair Mobility    Modified Rankin (Stroke Patients Only) Modified Rankin (Stroke Patients Only) Pre-Morbid Rankin Score: Moderate disability Modified Rankin: Moderately severe disability     Balance Overall balance assessment: Needs assistance Sitting-balance support: Feet supported;No upper extremity supported Sitting balance-Leahy Scale: Fair Sitting balance - Comments: sat EOB for eating lunch   Standing balance support: Bilateral upper extremity supported Standing balance-Leahy Scale: Poor Standing balance comment: UE support needed for balance and assist for safety pt with elevated INR.                             Pertinent Vitals/Pain Pain Assessment: Faces Faces Pain Scale: Hurts little more Pain Location: knees and generalized from falls Pain Descriptors / Indicators: Aching;Sore Pain Intervention(s): Limited activity within patient's tolerance;Monitored during session;Repositioned    Home Living Family/patient expects to be discharged to:: Private residence Living Arrangements: Alone Available Help at Discharge: Family;Available PRN/intermittently;Neighbor Type of Home: House Home Access: Stairs to enter Entrance Stairs-Rails: Right;Left;Can reach both Technical brewer of Steps: 3 Home Layout: One level Home Equipment: [place] - 2 wheels;Shower seat;Grab bars - toilet      Prior Function Level of Independence: Needs assistance  Gait / Transfers Assistance Needed: used cane and walker at times  ADL's / Homemaking Assistance Needed: neighbor helps her get groceries in her home; gets  cans and frozen dinners  Comments: Uses cane or walker at home.     Hand Dominance   Dominant Hand: Right    Extremity/Trunk Assessment   Upper Extremity Assessment: Defer to OT evaluation           Lower Extremity Assessment: RLE deficits/detail;LLE deficits/detail RLE Deficits / Details: AROM WFL, strength grossly 4/5 LLE Deficits / Details: AROM limited knee motion due to pain at proximal tibia from fall; strength hip flexion 3/5, knee extension 4-/5, ankle DF 4.5     Communication   Communication: Expressive difficulties (word finding trouble)  Cognition Arousal/Alertness: Awake/alert Behavior During Therapy: WFL for tasks assessed/performed Overall Cognitive Status: Impaired/Different from baseline Area of Impairment: Memory;Problem solving     Memory: Decreased short-term memory       Problem Solving: Slow processing;Requires verbal cues General Comments: difficulty dialing number on the phone, also trouble with recalling sequence to dial on the phone after education    General Comments General comments (skin integrity, edema, etc.): bruising back of R hand and over knees with some edema noted    Exercises        Assessment/Plan    PT Assessment Patient needs continued PT services  PT Diagnosis Abnormality of gait;Generalized weakness;Altered mental status   PT Problem List Decreased strength;Decreased cognition;Decreased activity tolerance;Decreased knowledge of use of DME;Decreased balance;Decreased mobility;Decreased safety awareness  PT Treatment Interventions DME instruction;Balance training;Gait training;Functional mobility training;Patient/family education;Therapeutic activities;Therapeutic exercise;Cognitive remediation;Stair training   PT Goals (Current goals can be found in the Care Plan section) Acute Rehab PT Goals Patient Stated Goal: get stronger and go home PT Goal Formulation: With patient Time For Goal Achievement: 01/08/16 Potential to  Achieve Goals: Good    Frequency Min 4X/week   Barriers to discharge        Co-evaluation               End of Session Equipment Utilized During Treatment: Gait belt Activity Tolerance: Patient limited by pain;Treatment limited secondary to medical complications (Comment) (elevated INR) Patient left: in bed;with call bell/phone within reach;with bed alarm set Nurse Communication: Other (comment) (needs wax for broken tooth)         Time: [id number] PT Time Calculation (min) (ACUTE ONLY): 32 min   Charges:   PT Evaluation $PT Eval Moderate Complexity: 1 Procedure PT Treatments $Therapeutic Activity: 8-22 mins   PT G CodesReginia Naas January 10, 2016, 4:32 PM  Magda Kiel, [place] 10-Jan-2016

## 2015-12-25 NOTE — Consult Note (Addendum)
Admit date: 12/24/2015 Referring Physician  Dr. Nori Riis Primary Physician  Dr.  Georges Lynch Primary Cardiologist  Dr. Casandra Doffing Reason for Consultation    HPI: Kristi [name] is a 75 y.o. female presenting with a history of ASCAD s/p IWMI with PCI 2008, pulmonary HTN, HTN, dyslipidemia and PAF on chronic systemic anticoagulation with Coumadin.  Her PMH also includes ESRD on dialysis, DM, COPD.  She was admitted by the teaching service reportedly from H&P due to multiple syncopal events over the past month.  Her SBP has been in the 90's and her events occur with changes in position and also is on chronic pain meds.  It was felt by the admitting service that her syncope was due to orthostatic hypotension.  She also reports episodes where the room is spinning.  She has fallen several times. She also complains of LUE weakness.   Apparently her BP had been running low at HD and she has not been taking her BB or diuretic for a few weeks.  She was given a bolus of NS in the ER.  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  Carotid dopplers are pending.  Last echo 09/2015 showed normal LVF with moderate pulmonary HTN.  Cardiology is now asked to consult in regards to cardiac etiology of syncope.  In talking to the patient she completely denies any syncopal episodes.  She has fallen frequently over the past few weeks but only a few of those were due to dizziness per the patient.  She adamantly denies any LOC with her falls.  She denies any palpitations.  She denies any chest pain, SOB, DOE, LE edema.        PMH:   Past Medical History  Diagnosis Date  . Hyperlipidemia   . Hyperparathyroidism   . CAD (coronary artery disease)   .  Myocardial infarction ([place]) 2008  . Hypertension   . COPD (chronic obstructive pulmonary disease) ([place])   . Diabetes mellitus     diagnosed 2010 - takes oral meds and lantus insulin  . Anemia   . GERD (gastroesophageal reflux disease)     on omeprazole  . Arthritis     all over  . CHF (congestive heart failure) ([place]) 08-2011  . CVA 02/19/2010  . CAD 02/19/2010  . DEEP VENOUS THROMBOPHLEBITIS, LEG, LEFT 03/08/2010    in 2009 s/p hospitalization- only 1 isolated dvt  . Pulmonary artery hypertension ([place]) 10/31/2014  . Shortness of breath dyspnea     with exertion  . Depression     years ago  . Dry skin   . Headache   . Dysrhythmia     atrial fibrillation  . Chronic kidney disease     esrd - since 2010 - DIALYIS      PSH:   Past Surgical History  Procedure Laterality Date  . Av fistula placement  04/05/11    Left Radiocephalic AVF  . Appendectomy  1969  . Tubal ligation    . Revision of fisturl    . Coronary angioplasty with stent placement  06/23/2007    by Dr. Irish Lack is her cardiologist  . Ligation goretex fistula  10/29/2011    Procedure: LIGATION GORE-TEX FISTULA;  Surgeon: Elam Dutch, MD;  Location: [facility] OR;  Service: Vascular;  Laterality: Left;  Ligation of competing branches left forearm fistula  . Fistulogram N/A 02/17/2012    Procedure: FISTULOGRAM;  Surgeon: Angelia Mould, MD;  Location: [facility] CATH LAB;  Service: Cardiovascular;  Laterality: N/A;  . Insertion of dialysis catheter N/A 01/12/2015    Procedure: INSERTION OF DIALYSIS CATHETER;  Surgeon: Conrad , MD;  Location: [place];  Service: Vascular;  Laterality: N/A;  . Esophagogastroduodenoscopy N/A 01/20/2015    Procedure: ESOPHAGOGASTRODUODENOSCOPY (EGD);  Surgeon: Wonda Horner, MD;  Location: [facility] ENDOSCOPY;  Service: Endoscopy;  Laterality: N/A;  . Eye surgery Bilateral     cataract surgery with lens implants  . Tonsillectomy    . Bascilic vein transposition Left 03/24/2015    Procedure: LEFT ARM  BASILIC VEIN TRANSPOSITION;  Surgeon: Angelia Mould, MD;  Location: [place];  Service: Vascular;  Laterality: Left;  . Ligation of arteriovenous  fistula Left 03/24/2015    Procedure: LIGATION OF left arm radio-cephalic ARTERIOVENOUS  FISTULA;  Surgeon: Angelia Mould, MD;  Location: [place];  Service: Vascular;  Laterality: Left;  . Revison of arteriovenous fistula Left 04/08/257    Procedure: PLICATION OF BASILIC VEIN TRANSPOSITION FISTULA;  Surgeon: Angelia Mould, MD;  Location: [place];  Service: Vascular;  Laterality: Left;    Allergies:  Losartan and Chantix Prior to Admit Meds:   Prescriptions prior to admission  Medication Sig Dispense Refill Last Dose  . acetaminophen (TYLENOL) 500 MG tablet Take 1,000 mg by mouth every 6 (six) hours as needed for moderate pain.    12/23/2015 at Unknown time  . albuterol (PROVENTIL HFA;VENTOLIN HFA) 108 (90 BASE) MCG/ACT inhaler Inhale into the lungs every 4 (four) hours as needed for wheezing.   Past Week at Unknown time  . allopurinol (ZYLOPRIM) 300 MG tablet Take 300 mg by mouth daily.  5 12/24/2015 at Unknown time  . calcium acetate (PHOSLO) 667 MG capsule Take 1,334 mg by mouth 3 (three) times daily with meals.    Past Week at Unknown time  . CINNAMON PO Take 1,000 mg by mouth daily.   12/24/2015 at Unknown time  . ferrous sulfate 325 (65 FE) MG tablet Take 325 mg by mouth daily.   12/24/2015 at Unknown time  . fexofenadine (ALLEGRA) 180 MG tablet Take 180 mg by mouth daily as needed for allergies or rhinitis.   12/23/2015 at Unknown time  . furosemide (LASIX) 80 MG tablet Take 80-240 mg by mouth daily. Takes 3 tabs on Sun, Mon, Wed and Fri  Takes 1 tab all other days   12/24/2015 at Unknown time  . insulin glargine (LANTUS) 100 UNIT/ML injection Inject 0.05 mLs (5 Units total) into the skin every morning. If blood sugar is >140 add 1 unit. If blood sugar is <140 subtract 1 unit. 10 mL 2 12/24/2015 at Unknown time  . metoprolol succinate  (TOPROL-XL) 25 MG 24 hr tablet Take 12.5 mg by mouth 2 (two) times a week. Takes on tues and thurs   Past Week at Unknown time  . midodrine (PROAMATINE) 10 MG tablet Take 10 mg by mouth 2 (two) times daily.    Past Week at Unknown time  . mometasone-formoterol (DULERA) 100-5 MCG/ACT AERO Inhale 2 puffs into the lungs 2 (two) times daily as needed for shortness of breath.   Past Week  . nitroGLYCERIN (NITROSTAT) 0.4 MG SL tablet Place 0.4 mg under the tongue every 5 (five) minutes as needed. For chest  pain     Call 911 if pain not relieved by 3rd tab   not used  . omeprazole (PRILOSEC) 20 MG capsule Take 1 capsule (20 mg total) by mouth at bedtime. (Patient taking differently: Take 20 mg by mouth daily as needed (only takes when eating late in the night). ) 90 capsule 3 Past Week at Unknown time  . Polyethyl Glycol-Propyl Glycol (SYSTANE OP) Apply 1-2 drops to eye 4 (four) times daily as needed. For dry eyes   Past Week at Unknown time  . pravastatin (PRAVACHOL) 40 MG tablet TAKE 1 TABLET BY MOUTH EVERY DAY 90 tablet 3 Past Week at Unknown time  . vitamin B-12 (CYANOCOBALAMIN) 1000 MCG tablet Take 1,000 mcg by mouth every morning.    Past Week at Unknown time  . warfarin (COUMADIN) 5 MG tablet TAKE AS DIRECTED BY COUMADIN CLINIC (Patient taking differently: TAKES 2.5MG ON TUES ONLY, TAKES 5MG ALL OTHER DAYS) 35 tablet 3 Past Week at Unknown time  . Blood Glucose Monitoring Suppl (ACCU-CHEK AVIVA) device Use as instructed 1 each 0 Taking  . glucose blood (ACCU-CHEK AVIVA) test strip Use as instructed 100 each 12 Taking  . Lancet Devices (ACCU-CHEK SOFTCLIX) lancets Use as instructed 100 each 12 Taking  . Lancets Misc. (ACCU-CHEK SOFTCLIX LANCET DEV) KIT 1 strip by Does not apply route 3 (three) times daily. 1 kit 0 Taking  . Omega-3 Fatty Acids (FISH OIL) 1200 MG CAPS Take 2 capsules by mouth 2 (two) times daily.   Taking  . oxyCODONE-acetaminophen (ROXICET) 5-325 MG per tablet Take 1-2 tablets by mouth  every 4 (four) hours as needed for severe pain. (Patient not taking: Reported on 10/06/2015) 30 tablet 0 Not Taking  . Tiotropium Bromide Monohydrate 2.5 MCG/ACT AERS Inhale 2 puffs into the lungs daily. 1 Inhaler 11 Taking   Fam HX:    Family History  Problem Relation Age of Onset  . Cancer Mother     multiple myloma  . Dementia Father   . Pneumonia Father   . Anuerysm Father    Social HX:    Social History   Social History  . Marital Status: Widowed    Spouse Name: N/A  . Number of Children: 3  . Years of Education: N/A   Occupational History  . RetiredCatering manager Work    Social History Main Topics  . Smoking status: Former Smoker -- 0.50 packs/day for 53 years    Types: Cigarettes    Start date: 12/09/1960    Quit date: 03/31/2014  . Smokeless tobacco: Never Used  . Alcohol Use: 0.0 oz/week    0 Standard drinks or equivalent per week     Comment: occasional  . Drug Use: No  . Sexual Activity: Not on file   Other Topics Concern  . Not on file   Social History Narrative   Health Care POA: [place]   Emergency Contact: Suzan Slick    End of Life Plan:    Who lives with you: Lives by her self in 1 story home   Any pets:    Diet: Patient typically eats frozen meal/sandwiches.  Has problems standing to cook.   Exercise: Patient does not have a regular exercise plan.   Seatbelts: Patient reports wearing seat belt when in vehicle.   Sun Exposure/Protection: Patient does not wear sun protection.   Hobbies: Crafts, knitting, crotcheting           ROS:  All 11 ROS  were addressed and are negative except what is stated in the HPI  Physical Exam: Blood pressure 115/45, pulse 76, temperature 97.4 F (36.3 C), temperature source Oral, resp. rate 16, height 5' (1.524 m), weight 160 lb 11.5 oz (72.9 kg), SpO2 95 %.    General: Well developed, well nourished, in no acute distress Head: Eyes PERRLA, No xanthomas.   Normal cephalic and  atramatic  Lungs:   Clear bilaterally to auscultation and percussion. Heart:   HRRR S1 S2 Pulses are 2+ & equal.            No carotid bruit. No JVD.  No abdominal bruits. No femoral bruits. Abdomen: Bowel sounds are positive, abdomen soft and non-tender without masses Extremities:   No clubbing, cyanosis or edema.  DP +1 Neuro: Alert and oriented X 3. Psych:  Good affect, responds appropriately    Labs:   Lab Results  Component Value Date   WBC 6.9 12/25/2015   HGB 11.0* 12/25/2015   HCT 34.2* 12/25/2015   MCV 105.6* 12/25/2015   PLT 193 12/25/2015    Recent Labs Lab 12/24/15 1300  12/25/15 0344  NA 136  < > 137  K 4.6  < > 3.7  CL 94*  < > 98*  CO2 28  --  27  BUN 14  < > 18  CREATININE 5.37*  < > 6.79*  CALCIUM 10.5*  --  10.2  PROT 5.7*  --   --   BILITOT 0.9  --   --   ALKPHOS 104  --   --   ALT 7*  --   --   AST 29  --   --   GLUCOSE 100*  < > 122*  < > = values in this interval not displayed. No results found for: PTT Lab Results  Component Value Date   INR 5.59* 12/25/2015   INR 5.84* 12/24/2015   INR 2.39 12/12/2015   Lab Results  Component Value Date   CKTOTAL 175 12/24/2015   CKMB 2.6 01/19/2010   TROPONINI <0.30 10/29/2014     Lab Results  Component Value Date   CHOL 157 09/30/2014   CHOL 164 04/05/2013   CHOL 179 01/18/2011   Lab Results  Component Value Date   HDL 32* 09/30/2014   HDL 39 04/05/2013   HDL 34* 01/18/2011   Lab Results  Component Value Date   LDLCALC 92 09/30/2014   LDLCALC 98 04/05/2013   LDLCALC 117* 01/18/2011   Lab Results  Component Value Date   TRIG 166* 09/30/2014   TRIG 39 04/05/2013   TRIG 140 01/18/2011   Lab Results  Component Value Date   CHOLHDL 4.9 09/30/2014   CHOLHDL 5.3 01/18/2011   CHOLHDL 4.1 01/17/2010   No results found for: LDLDIRECT    Radiology:  Dg Chest 2 View  12/24/2015  CLINICAL DATA:  Vertigo.  Fall.  Chronic renal failure on dialysis. EXAM: CHEST  2 VIEW COMPARISON:   01/19/2015 FINDINGS: Mild cardiomegaly is stable. Pulmonary hyperinflation and coarsening of interstitial lung markings is stable. No evidence of acute pulmonary infiltrate or edema. No evidence of pneumothorax or pleural effusion. Right jugular central venous dialysis catheter remains in appropriate position. IMPRESSION: Stable mild cardiomegaly.  Suspect COPD.  No active lung disease. Electronically Signed   By: Earle Gell M.D.   On: 12/24/2015 11:49   Dg Wrist Complete Left  12/24/2015  CLINICAL DATA:  Became dizzy and fell landing on her elbow, was on  floor until 0900 this morning when she was found by her daughter, history dialysis EXAM: LEFT WRIST - COMPLETE 3+ VIEW COMPARISON:  None FINDINGS: Diffuse soft tissue swelling. Surgical clips at the radial aspect of distal forearm question related to dialysis access procedure. Extensive small vessel vascular calcifications. Degenerative changes first [facility] joint. No definite acute fracture, dislocation or bone destruction. IMPRESSION: Osseous demineralization with degenerative changes at first [facility] joint. No definite acute bony abnormalities. Electronically Signed   By: Lavonia Dana M.D.   On: 12/24/2015 11:45   Ct Head Wo Contrast  12/24/2015  CLINICAL DATA:  Pt states she became dizzy and fell, was on floor until 9 am. Hit side of head, also c/o neck stiffness Pt on blood thinners Hx of vertigo, dialysis, CVA, CHF, HTN, DM EXAM: CT HEAD WITHOUT CONTRAST CT CERVICAL SPINE WITHOUT CONTRAST TECHNIQUE: Multidetector CT imaging of the head and cervical spine was performed following the standard protocol without intravenous contrast. Multiplanar CT image reconstructions of the cervical spine were also generated. COMPARISON:  CT head 01/16/2010. FINDINGS: CT HEAD FINDINGS No evidence for acute infarction, hemorrhage, mass lesion, hydrocephalus, or extra-axial fluid. Generalized atrophy. Remote LEFT parietal infarct. Generalized hypoattenuation of white matter  representing small vessel disease. No skull fracture is evident. There is moderate vascular calcification. No sinus or mastoid air fluid level. BILATERAL cataract extraction. CT CERVICAL SPINE FINDINGS There is no visible cervical spine fracture, traumatic subluxation, prevertebral soft tissue swelling, or intraspinal hematoma. Advanced disc space narrowing of a chronic nature at C3-4, C5-6, and C6-7. No neck masses. No lung apex lesion. Atherosclerosis. Dual lumen dialysis catheter from a RIGHT IJ approach. No worrisome osseous lesions. IMPRESSION: Atrophy and small vessel disease.  Remote LEFT parietal infarct. No skull fracture or intracranial hemorrhage. Advanced cervical spondylosis.  Extensive atherosclerosis. No cervical spine fracture or traumatic subluxation. Electronically Signed   By: Staci Righter M.D.   On: 12/24/2015 11:17   Ct Cervical Spine Wo Contrast  12/24/2015  CLINICAL DATA:  Pt states she became dizzy and fell, was on floor until 9 am. Hit side of head, also c/o neck stiffness Pt on blood thinners Hx of vertigo, dialysis, CVA, CHF, HTN, DM EXAM: CT HEAD WITHOUT CONTRAST CT CERVICAL SPINE WITHOUT CONTRAST TECHNIQUE: Multidetector CT imaging of the head and cervical spine was performed following the standard protocol without intravenous contrast. Multiplanar CT image reconstructions of the cervical spine were also generated. COMPARISON:  CT head 01/16/2010. FINDINGS: CT HEAD FINDINGS No evidence for acute infarction, hemorrhage, mass lesion, hydrocephalus, or extra-axial fluid. Generalized atrophy. Remote LEFT parietal infarct. Generalized hypoattenuation of white matter representing small vessel disease. No skull fracture is evident. There is moderate vascular calcification. No sinus or mastoid air fluid level. BILATERAL cataract extraction. CT CERVICAL SPINE FINDINGS There is no visible cervical spine fracture, traumatic subluxation, prevertebral soft tissue swelling, or intraspinal  hematoma. Advanced disc space narrowing of a chronic nature at C3-4, C5-6, and C6-7. No neck masses. No lung apex lesion. Atherosclerosis. Dual lumen dialysis catheter from a RIGHT IJ approach. No worrisome osseous lesions. IMPRESSION: Atrophy and small vessel disease.  Remote LEFT parietal infarct. No skull fracture or intracranial hemorrhage. Advanced cervical spondylosis.  Extensive atherosclerosis. No cervical spine fracture or traumatic subluxation. Electronically Signed   By: Staci Righter M.D.   On: 12/24/2015 11:17   Mri Brain Without Contrast  12/24/2015  ADDENDUM REPORT: 12/24/2015 22:07 ADDENDUM: Possibly stenotic distal RIGHT M1 segment and, possible slow flow or occlusion LEFT vertebral  artery ; findings would be better characterized on MRA of the head. Electronically Signed   By: Elon Alas M.D.   On: 12/24/2015 22:07  12/24/2015  CLINICAL DATA:  Multiple syncopal episodes. New RIGHT hand weakness. History of diabetes, hypertension, hyperlipidemia, stroke, chronic renal disease. EXAM: MRI HEAD WITHOUT CONTRAST TECHNIQUE: Multiplanar, multiecho pulse sequences of the brain and surrounding structures were obtained without intravenous contrast. COMPARISON:  CT head December 24, 2015 at 1044 hours FINDINGS: Subcentimeter focus of reduced diffusion RIGHT corona radiata/basal ganglia with low ADC value. Linear subcentimeter focus of reduced diffusion LEFT occipital lobe, favoring T2 shine through. Small nonspecific micro hemorrhage LEFT parietal occipital lobe associated with encephalomalacia. Small focus of encephalomalacia LEFT frontal lobe. Moderate ventriculomegaly on the basis of global parenchymal brain volume loss. Patchy to confluent supratentorial and pontine white matter FLAIR T2 hyperintensities. Old bilateral basal ganglia and bilateral thalamus lacunar infarcts. Old small RIGHT cerebellar infarcts. No midline shift, mass effect or mass lesions. No abnormal extra-axial fluid  collections. No discernible LEFT vertebral artery flow void. Suspected stenosis distal RIGHT M1 segment, though partial volume Ing female result in this appearance. Status post bilateral ocular lens implants. Visualized paranasal sinuses and mastoid air cells are well aerated. No abnormal sellar expansion. No cerebellar tonsillar ectopia. No suspicious calvarial bone marrow signal. IMPRESSION: Subcentimeter acute ischemia RIGHT corona radiata/ basal ganglia. LEFT parietal occipital encephalomalacia suggests watershed infarct or old trauma. Small area LEFT frontal lobe encephalomalacia. Moderate chronic small vessel ischemic disease. Old bilateral basal ganglia and thalamus lacunar infarcts. Old small RIGHT cerebellar infarcts. Nonvisualized LEFT vertebral artery flow void, this could represent RIGHT dominance or, slow flow/occlusion. Electronically Signed: By: Elon Alas M.D. On: 12/24/2015 20:58   Ct Hip Left Wo Contrast  12/25/2015  CLINICAL DATA:  Severe left hip pain since a fall yesterday. EXAM: CT OF THE LEFT HIP WITHOUT CONTRAST TECHNIQUE: Multidetector CT imaging of the left hip was performed according to the standard protocol. Multiplanar CT image reconstructions were also generated. COMPARISON:  Radiographs dated 12/24/2015 FINDINGS: There is no fracture or dislocation or bone destruction. Slight arthritic changes of the left sacroiliac joint. Extensive iliac atherosclerosis. No soft tissue hematomas. IMPRESSION: No acute abnormality of the left hip. Electronically Signed   By: Lorriane Shire M.D.   On: 12/25/2015 11:36   Dg Hip Unilat With Pelvis 2-3 Views Left  12/24/2015  CLINICAL DATA:  Vertigo.  Fall. EXAM: DG HIP (WITH OR WITHOUT PELVIS) 2-3V LEFT COMPARISON:  None. FINDINGS: Osteopenia. No acute fracture. No dislocation. Prominent vascular calcifications. Mild degenerative change in the hip joints. IMPRESSION: No acute bony pathology. Electronically Signed   By: Marybelle Killings M.D.   On:  12/24/2015 11:47    EKG:  NSR with inferior and anterolateral ST abnormality which is unchanged from 01/2015  ASSESSMENT/PLAN: 1.  ? Syncope-  The patient adamantly denies any LOC with her falls.  She has had some dizziness with change in position but no presyncope or syncope.  This sounds more orthostatic in nature as it occurs mainly with changes in position from sitting to standing and also has had to hold her diuretic and BB over the past few weeks due to low BP during HD.  She denies any palpitations.  Unlikely to be primary arrhythmia but will get a 30 day heart monitor to assess for bradyarrhythmias or long pauses.  She does have baseline ST changes on EKG but these are unchanged in the past year.  She denies any  anginal symptoms.  I do not think this is from coronary ischemia.  Await results of repeat 2D echo.  Carotid dopplers are pending.  Agree with Midodrine for orthostatic hypotension.   2.  Paroxysmal atrial fibrillation maintaining NSR with supratherapeutic INR 3.  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  Continue statin/ASA.  BP too soft to resume BB. 4.  HTN - on soft side 5.  Dyslipidemia - continue statin 6.  LUE weakness with MRI showing several new acute infarcts ( MRI Acute R basal Ganglia Infarct and ? Left parietal Occipital infarct).  Neuro w/u in progress. 7.  ESRD on HD    Sueanne Margarita, MD  12/25/2015  11:57 AM

## 2015-12-25 NOTE — Progress Notes (Signed)
Pt INR 5.59 this AM, FMTS paged.

## 2015-12-25 NOTE — Evaluation (Addendum)
Occupational Therapy Evaluation Patient Details Name: Kristi [name] MRN: [id number] DOB: 1941-02-18 Today's Date: 12/25/2015    History of Present Illness 75 y.o. female with hx of ESRD on HD, Chronic back painand right shoulder pain, osteoporosis, COPD, arthritis, CAD, CHF, A fib, DM, HLD admitted with multiple syncopal episodes over the past month. On admission she was found to have LUE weakness so a MRI brain was ordered. Patient reports her left arm has been weak for around 1-2 weeks. She has a history of A fib for which she is on coumadin. INR on admission was 5.64. MRI brain imaging reviewed, shows small right basal ganglia infarct, question of right M1 stenosis and occlusion/slow flow of left vertebral artery, LEFT parietal occipital encephalomalacia suggests watershed infarct or old trauma. Small area LEFT frontal lobe encephalomalacia. 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   Clinical Impression   Pt admitted with above. Pt independent with ADLs (assist for IADLs), PTA. Feel pt will benefit from acute OT to increase independence prior to d/c. Pt reports diplopia when turning her head and looking to the side.     Follow Up Recommendations  CIR    Equipment Recommendations  Other (comment) (defer to next venue)    Recommendations for Other Services Rehab consult;Speech consult     Precautions / Restrictions Precautions Precautions: Fall Restrictions Weight Bearing Restrictions: No      Mobility Bed Mobility Overal bed mobility: Needs Assistance Bed Mobility: Supine to Sit;Sit to Supine     Supine to sit: Min assist Sit to supine: Supervision   General bed mobility comments: assist to scoot HOB and assisted with trunk when coming to sitting position.  Transfers Overall transfer level: Needs assistance   Transfers: Sit to/from Stand Sit to Stand: Min assist         General transfer comment: provided support at LUE    Balance Overall balance assessment: History of  Falls              Min assist for short distance ambulation and pt also holding to table.                            ADL Overall ADL's : Needs assistance/impaired                     Lower Body Dressing: Minimal assistance;Sit to/from stand   Toilet Transfer: Minimal assistance;Ambulation (sit to stand from bed)           Functional mobility during ADLs: Minimal assistance General ADL Comments: Encouraged pt to be using LUE. Educated on energy conservation.     Vision  Pt reports diplopia when turning head and looking to the side.  Visual tracking: appeared to lose OT's finger in superior quadrant and Lt quadrant Visual fields: inconsistent   Perception     Praxis      Pertinent Vitals/Pain Pain Assessment: 0-10 Pain Score:  (3.5) Pain Location: bilateral shoulders, Lt hip, buttocks Pain Descriptors / Indicators: Grimacing     Hand Dominance     Extremity/Trunk Assessment Upper Extremity Assessment Upper Extremity Assessment: RUE deficits/detail;LUE deficits/detail RUE Deficits / Details: appeared to have close to full AROM shoulder flexion; able to withstand little to no resistance when testing strength of shoulder flexors LUE Deficits / Details: unable to achieve full AROM shoulder flexion; weak grip strength LUE Sensation: decreased light touch LUE Coordination: decreased gross motor (effortful with fine motor coordination test)  Lower Extremity Assessment Lower Extremity Assessment: Defer to PT evaluation       Communication Communication Communication: Expressive difficulties   Cognition Arousal/Alertness: Awake/alert Behavior During Therapy: WFL for tasks assessed/performed Overall Cognitive Status: Impaired/Different from baseline Area of Impairment: Memory     Memory: Decreased short-term memory (pt reports this is new)             General Comments       Exercises       Shoulder Instructions      Home Living  Family/patient expects to be discharged to:: Private residence Living Arrangements: Alone Available Help at Discharge: Family;Friend(s);Neighbor;Available PRN/intermittently Type of Home: House Home Access: Stairs to enter [employer] of Steps: 3 Entrance Stairs-Rails: Right;Left;Can reach both Home Layout: One level     Bathroom Shower/Tub: Occupational psychologist: Handicapped height     Home Equipment: Environmental consultant - 2 wheels;Shower seat (per previous admission also has cane and 4 wheeled walker)          Prior Functioning/Environment Level of Independence: Needs assistance  Gait / Transfers Assistance Needed: used cane and walker at times ADL's / Homemaking Assistance Needed: assist with meal prep and cleaning        OT Diagnosis: Generalized weakness   OT Problem List: Impaired vision/perception;Impaired balance (sitting and/or standing);Decreased activity tolerance;Decreased range of motion;Decreased strength;Decreased coordination;Pain;Impaired UE functional use;Decreased knowledge of precautions;Decreased knowledge of use of DME or AE;Decreased cognition;Impaired sensation   OT Treatment/Interventions: Self-care/ADL training;Therapeutic exercise;DME and/or AE instruction;Therapeutic activities;Patient/family education;Balance training;Cognitive remediation/compensation;Visual/perceptual remediation/compensation    OT Goals(Current goals can be found in the care plan section) Acute Rehab OT Goals Patient Stated Goal: go home OT Goal Formulation: With patient Time For Goal Achievement: 01/01/16 Potential to Achieve Goals: Good ADL Goals Pt Will Perform Lower Body Dressing: sit to/from stand;with set-up;with supervision Pt Will Transfer to Toilet: with set-up;with supervision;ambulating;bedside commode Pt Will Perform Toileting - Clothing Manipulation and hygiene: with set-up;with supervision;sit to/from stand Additional ADL Goal #1: Pt will independently  perform HEP for bilateral UEs to increase strength (and coordination in LUE). Additional ADL Goal #2: Pt will use LUE functionally during session during ADLs.  OT Frequency: Min 2X/week   Barriers to D/C:            Co-evaluation              End of Session Equipment Utilized During Treatment: Gait belt  Activity Tolerance:  (limited due to high INR) Patient left: in bed;with call bell/phone within reach;with bed alarm set   Time: [id number] OT Time Calculation (min): 22 min Charges:  OT General Charges $OT Visit: 1 Procedure OT Evaluation $OT Eval Moderate Complexity: 1 Procedure G-CodesBenito Mccreedy OTR/L [id number] 12/25/2015, 12:52 PM

## 2015-12-25 NOTE — Consult Note (Signed)
[facility] Renal Consultation Note  Indication for Consultation:  Management of ESRD/hemodialysis; anemia, hypertension/volume and secondary hyperparathyroidism  HPI: Kristi [name] is a 75 y.o. female admitted with syncopal episodes past month . She denies LOC. Last HD sat on schedule  Leaving below edw  With only 0.7  kg gain sat 14th and thurs 12th 1.1 leaving below edw.  Has known ho A. Fib., cad ap stenting / DM , COPD , PAH. No reported problems from op [facility] Saturday 14th . She denies sob, cp,, fevers, chills, abd pain, cough, . Has R IJ permcath used 2 needles Thursday 12th  And 14th used perm cath sec ?avf prior infiltration. She at first tells me that these dizzy episodes are not related to dialysis but then says they might be      Past Medical History  Diagnosis Date  . Hyperlipidemia   . Hyperparathyroidism   . CAD (coronary artery disease)   . Myocardial infarction ([place]) 2008  . Hypertension   . COPD (chronic obstructive pulmonary disease) ([place])   . Diabetes mellitus     diagnosed 2010 - takes oral meds and lantus insulin  . Anemia   . GERD (gastroesophageal reflux disease)     on omeprazole  . Arthritis     all over  . CHF (congestive heart failure) ([place]) 08-2011  . CVA 02/19/2010  . CAD 02/19/2010  . DEEP VENOUS THROMBOPHLEBITIS, LEG, LEFT 03/08/2010    in 2009 s/p hospitalization- only 1 isolated dvt  . Pulmonary artery hypertension ([place]) 10/31/2014  . Shortness of breath dyspnea     with exertion  . Depression     years ago  . Dry skin   . Headache   . Dysrhythmia     atrial fibrillation  . Chronic kidney disease     esrd - since 2010 - DIALYIS     Past Surgical History  Procedure Laterality Date  . Av fistula placement  04/05/11    Left Radiocephalic AVF  . Appendectomy  1969  . Tubal ligation    . Revision of fisturl    . Coronary angioplasty with stent placement  06/23/2007    by Dr. Irish Lack is her cardiologist  . Ligation  goretex fistula  10/29/2011    Procedure: LIGATION GORE-TEX FISTULA;  Surgeon: Elam Dutch, MD;  Location: [facility] OR;  Service: Vascular;  Laterality: Left;  Ligation of competing branches left forearm fistula  . Fistulogram N/A 02/17/2012    Procedure: FISTULOGRAM;  Surgeon: Angelia Mould, MD;  Location: [facility] CATH LAB;  Service: Cardiovascular;  Laterality: N/A;  . Insertion of dialysis catheter N/A 01/12/2015    Procedure: INSERTION OF DIALYSIS CATHETER;  Surgeon: Conrad Gibsonburg, MD;  Location: [place];  Service: Vascular;  Laterality: N/A;  . Esophagogastroduodenoscopy N/A 01/20/2015    Procedure: ESOPHAGOGASTRODUODENOSCOPY (EGD);  Surgeon: Wonda Horner, MD;  Location: [facility] ENDOSCOPY;  Service: Endoscopy;  Laterality: N/A;  . Eye surgery Bilateral     cataract surgery with lens implants  . Tonsillectomy    . Bascilic vein transposition Left 03/24/2015    Procedure: LEFT ARM BASILIC VEIN TRANSPOSITION;  Surgeon: Angelia Mould, MD;  Location: [place];  Service: Vascular;  Laterality: Left;  . Ligation of arteriovenous  fistula Left 03/24/2015    Procedure: LIGATION OF left arm radio-cephalic ARTERIOVENOUS  FISTULA;  Surgeon: Angelia Mould, MD;  Location: [place];  Service: Vascular;  Laterality: Left;  . Revison of arteriovenous fistula  Left 12/14/1094    Procedure: PLICATION OF BASILIC VEIN TRANSPOSITION FISTULA;  Surgeon: Angelia Mould, MD;  Location: [facility] OR;  Service: Vascular;  Laterality: Left;      Family History  Problem Relation Age of Onset  . Cancer Mother     multiple myloma  . Dementia Father   . Pneumonia Father   . Anuerysm Father       reports that she quit smoking about 20 months ago. Her smoking use included Cigarettes. She started smoking about 55 years ago. She has a 26.5 pack-year smoking history. She has never used smokeless tobacco. She reports that she drinks alcohol. She reports that she does not use illicit drugs.   Allergies  Allergen Reactions   . Losartan Other (See Comments)    Causes increase in creatinine and worsening CKD  . Chantix [Varenicline] Nausea Only and Other (See Comments)    Dizzy and GI symptoms     Prior to Admission medications   Medication Sig Start Date End Date Taking? Authorizing Provider  acetaminophen (TYLENOL) 500 MG tablet Take 1,000 mg by mouth every 6 (six) hours as needed for moderate pain.    Yes Historical Provider, MD  albuterol (PROVENTIL HFA;VENTOLIN HFA) 108 (90 BASE) MCG/ACT inhaler Inhale into the lungs every 4 (four) hours as needed for wheezing.   Yes Historical Provider, MD  allopurinol (ZYLOPRIM) 300 MG tablet Take 300 mg by mouth daily. 12/29/14  Yes Historical Provider, MD  calcium acetate (PHOSLO) 667 MG capsule Take 1,334 mg by mouth 3 (three) times daily with meals.    Yes Historical Provider, MD  CINNAMON PO Take 1,000 mg by mouth daily.   Yes Historical Provider, MD  ferrous sulfate 325 (65 FE) MG tablet Take 325 mg by mouth daily.   Yes Historical Provider, MD  fexofenadine (ALLEGRA) 180 MG tablet Take 180 mg by mouth daily as needed for allergies or rhinitis.   Yes Historical Provider, MD  furosemide (LASIX) 80 MG tablet Take 80-240 mg by mouth daily. Takes 3 tabs on Sun, Mon, Wed and Fri  Takes 1 tab all other days   Yes Historical Provider, MD  insulin glargine (LANTUS) 100 UNIT/ML injection Inject 0.05 mLs (5 Units total) into the skin every morning. If blood sugar is >140 add 1 unit. If blood sugar is <140 subtract 1 unit. 11/20/15  Yes Elberta Leatherwood, MD  metoprolol succinate (TOPROL-XL) 25 MG 24 hr tablet Take 12.5 mg by mouth 2 (two) times a week. Takes on tues and thurs 07/19/15  Yes Historical Provider, MD  midodrine (PROAMATINE) 10 MG tablet Take 10 mg by mouth 2 (two) times daily.    Yes Historical Provider, MD  mometasone-formoterol (DULERA) 100-5 MCG/ACT AERO Inhale 2 puffs into the lungs 2 (two) times daily as needed for shortness of breath.   Yes Historical Provider, MD   nitroGLYCERIN (NITROSTAT) 0.4 MG SL tablet Place 0.4 mg under the tongue every 5 (five) minutes as needed. For chest pain     Call 911 if pain not relieved by 3rd tab   Yes Historical Provider, MD  omeprazole (PRILOSEC) 20 MG capsule Take 1 capsule (20 mg total) by mouth at bedtime. Patient taking differently: Take 20 mg by mouth daily as needed (only takes when eating late in the night).  06/13/15  Yes Elberta Leatherwood, MD  Polyethyl Glycol-Propyl Glycol (SYSTANE OP) Apply 1-2 drops to eye 4 (four) times daily as needed. For dry eyes   Yes Historical  Provider, MD  pravastatin (PRAVACHOL) 40 MG tablet TAKE 1 TABLET BY MOUTH EVERY DAY 09/08/15  Yes Elberta Leatherwood, MD  vitamin B-12 (CYANOCOBALAMIN) 1000 MCG tablet Take 1,000 mcg by mouth every morning.    Yes Historical Provider, MD  warfarin (COUMADIN) 5 MG tablet TAKE AS DIRECTED BY COUMADIN CLINIC Patient taking differently: TAKES 2.5MG ON TUES ONLY, TAKES 5MG ALL OTHER DAYS 11/10/15  Yes Jettie Booze, MD  Blood Glucose Monitoring Suppl (ACCU-CHEK AVIVA) device Use as instructed 05/24/15 05/23/16  Elberta Leatherwood, MD  glucose blood (ACCU-CHEK AVIVA) test strip Use as instructed 05/24/15   Elberta Leatherwood, MD  Lancet Devices (ACCU-CHEK [facility]) lancets Use as instructed 05/24/15   Elberta Leatherwood, MD  Lancets Misc. (ACCU-CHEK SOFTCLIX LANCET DEV) KIT 1 strip by Does not apply route 3 (three) times daily. 05/24/15   Elberta Leatherwood, MD  Omega-3 Fatty Acids (FISH OIL) 1200 MG CAPS Take 2 capsules by mouth 2 (two) times daily.    Historical Provider, MD  oxyCODONE-acetaminophen (ROXICET) 5-325 MG per tablet Take 1-2 tablets by mouth every 4 (four) hours as needed for severe pain. Patient not taking: Reported on 10/06/2015 08/11/15   Angelia Mould, MD  Tiotropium Bromide Monohydrate 2.5 MCG/ACT AERS Inhale 2 puffs into the lungs daily. 10/06/15   Zenia Resides, MD     Anti-infectives    None      Results for orders placed or performed during the hospital  encounter of 12/24/15 (from the past 48 hour(s))  CBG monitoring, ED     Status: None   Collection Time: 12/24/15 10:29 AM  Result Value Ref Range   Glucose-Capillary 73 65 - 99 mg/dL  CBC with Differential/Platelet     Status: Abnormal   Collection Time: 12/24/15 11:35 AM  Result Value Ref Range   WBC 9.3 4.0 - 10.5 K/uL   RBC 3.62 (L) 3.87 - 5.11 MIL/uL   Hemoglobin 12.4 12.0 - 15.0 g/dL   HCT 37.8 36.0 - 46.0 %   MCV 104.4 (H) 78.0 - 100.0 fL   MCH 34.3 (H) 26.0 - 34.0 pg   MCHC 32.8 30.0 - 36.0 g/dL   RDW 15.1 11.5 - 15.5 %   Platelets 189 150 - 400 K/uL   Neutrophils Relative % 73 %   Neutro Abs 6.9 1.7 - 7.7 K/uL   Lymphocytes Relative 17 %   Lymphs Abs 1.6 0.7 - 4.0 K/uL   Monocytes Relative 8 %   Monocytes Absolute 0.7 0.1 - 1.0 K/uL   Eosinophils Relative 1 %   Eosinophils Absolute 0.1 0.0 - 0.7 K/uL   Basophils Relative 1 %   Basophils Absolute 0.1 0.0 - 0.1 K/uL  Type and screen     Status: None   Collection Time: 12/24/15 11:36 AM  Result Value Ref Range   ABO/RH(D) O POS    Antibody Screen NEG    Sample Expiration 12/27/2015   I-stat troponin, ED     Status: None   Collection Time: 12/24/15 11:47 AM  Result Value Ref Range   Troponin i, poc 0.04 0.00 - 0.08 ng/mL   Comment 3            Comment: Due to the release kinetics of cTnI, a negative result within the first hours of the onset of symptoms does not rule out myocardial infarction with certainty. If myocardial infarction is still suspected, repeat the test at appropriate intervals.   Protime-INR  Status: Abnormal   Collection Time: 12/24/15 12:04 PM  Result Value Ref Range   Prothrombin Time >50.0 (H) 11.6 - 15.2 seconds   INR 5.84 (HH) 0.00 - 1.49    Comment: REPEATED TO VERIFY CRITICAL RESULT CALLED TO, READ BACK BY AND VERIFIED WITH: DOOLEY,H RN 12/24/15 1302 WOOTEN,K   Comprehensive metabolic panel     Status: Abnormal   Collection Time: 12/24/15  1:00 PM  Result Value Ref Range   Sodium  136 135 - 145 mmol/L   Potassium 4.6 3.5 - 5.1 mmol/L   Chloride 94 (L) 101 - 111 mmol/L   CO2 28 22 - 32 mmol/L   Glucose, Bld 100 (H) 65 - 99 mg/dL   BUN 14 6 - 20 mg/dL   Creatinine, Ser 5.37 (H) 0.44 - 1.00 mg/dL   Calcium 10.5 (H) 8.9 - 10.3 mg/dL   Total Protein 5.7 (L) 6.5 - 8.1 g/dL   Albumin 2.4 (L) 3.5 - 5.0 g/dL   AST 29 15 - 41 U/L   ALT 7 (L) 14 - 54 U/L   Alkaline Phosphatase 104 38 - 126 U/L   Total Bilirubin 0.9 0.3 - 1.2 mg/dL   GFR calc non Af Amer 7 (L) >60 mL/min   GFR calc Af Amer 8 (L) >60 mL/min    Comment: (NOTE) The eGFR has been calculated using the CKD EPI equation. This calculation has not been validated in all clinical situations. eGFR's persistently <60 mL/min signify possible Chronic Kidney Disease.    Anion gap 14 5 - 15  CK     Status: None   Collection Time: 12/24/15  1:00 PM  Result Value Ref Range   Total CK 175 38 - 234 U/L  I-stat chem 8, ed     Status: Abnormal   Collection Time: 12/24/15  1:07 PM  Result Value Ref Range   Sodium 135 135 - 145 mmol/L   Potassium 4.7 3.5 - 5.1 mmol/L   Chloride 95 (L) 101 - 111 mmol/L   BUN 21 (H) 6 - 20 mg/dL   Creatinine, Ser 5.20 (H) 0.44 - 1.00 mg/dL   Glucose, Bld 94 65 - 99 mg/dL   Calcium, Ion 1.23 1.13 - 1.30 mmol/L   TCO2 30 0 - 100 mmol/L   Hemoglobin 13.3 12.0 - 15.0 g/dL   HCT 39.0 36.0 - 46.0 %  I-Stat CG4 Lactic Acid, ED     Status: None   Collection Time: 12/24/15  1:07 PM  Result Value Ref Range   Lactic Acid, Venous 1.40 0.5 - 2.0 mmol/L  CG4 I-STAT (Lactic acid)     Status: None   Collection Time: 12/24/15  4:43 PM  Result Value Ref Range   Lactic Acid, Venous 1.08 0.5 - 2.0 mmol/L  Glucose, capillary     Status: None   Collection Time: 12/24/15  5:18 PM  Result Value Ref Range   Glucose-Capillary 83 65 - 99 mg/dL  TSH     Status: None   Collection Time: 12/24/15  5:30 PM  Result Value Ref Range   TSH 0.918 0.350 - 4.500 uIU/mL  Glucose, capillary     Status: Abnormal    Collection Time: 12/24/15  9:13 PM  Result Value Ref Range   Glucose-Capillary 131 (H) 65 - 99 mg/dL  Uric acid     Status: None   Collection Time: 12/24/15 10:56 PM  Result Value Ref Range   Uric Acid, Serum 3.7 2.3 - 6.6 mg/dL  Basic  metabolic panel     Status: Abnormal   Collection Time: 12/25/15  3:44 AM  Result Value Ref Range   Sodium 137 135 - 145 mmol/L   Potassium 3.7 3.5 - 5.1 mmol/L   Chloride 98 (L) 101 - 111 mmol/L   CO2 27 22 - 32 mmol/L   Glucose, Bld 122 (H) 65 - 99 mg/dL   BUN 18 6 - 20 mg/dL   Creatinine, Ser 6.79 (H) 0.44 - 1.00 mg/dL   Calcium 10.2 8.9 - 10.3 mg/dL   GFR calc non Af Amer 5 (L) >60 mL/min   GFR calc Af Amer 6 (L) >60 mL/min    Comment: (NOTE) The eGFR has been calculated using the CKD EPI equation. This calculation has not been validated in all clinical situations. eGFR's persistently <60 mL/min signify possible Chronic Kidney Disease.    Anion gap 12 5 - 15  CBC     Status: Abnormal   Collection Time: 12/25/15  3:44 AM  Result Value Ref Range   WBC 6.9 4.0 - 10.5 K/uL   RBC 3.24 (L) 3.87 - 5.11 MIL/uL   Hemoglobin 11.0 (L) 12.0 - 15.0 g/dL   HCT 34.2 (L) 36.0 - 46.0 %   MCV 105.6 (H) 78.0 - 100.0 fL   MCH 34.0 26.0 - 34.0 pg   MCHC 32.2 30.0 - 36.0 g/dL   RDW 14.7 11.5 - 15.5 %   Platelets 193 150 - 400 K/uL  Protime-INR     Status: Abnormal   Collection Time: 12/25/15  3:44 AM  Result Value Ref Range   Prothrombin Time 48.8 (H) 11.6 - 15.2 seconds   INR 5.59 (HH) 0.00 - 1.49    Comment: CRITICAL RESULT CALLED TO, READ BACK BY AND VERIFIED WITH: ROSENBURGER,D RN 0444 1.16.17 MCADOO,G REPEATED TO VERIFY   Glucose, capillary     Status: None   Collection Time: 12/25/15  6:14 AM  Result Value Ref Range   Glucose-Capillary 95 65 - 99 mg/dL     ROS: see hpi  Physical Exam: Filed Vitals:   12/25/15 0028 12/25/15 0431  BP: 94/42 90/40  Pulse: 78 73  Temp:  97.8 F (36.6 C)  Resp: 18 16     General: alert elderly WF, NAD   HEENT: [place] multiple  teeth missing / MMM/ EOMI Neck: supple no jvd Heart: Ireg/irreg wit VR ~~80/ no mur, rub or gallop Lungs: CTA bilat Abdomen: Sl obese, BS pos. Soft , nt. nd Extremities:  No pedal edema Skin: no pedal ulcers or overt rash Neuro: OX3, moves all extrem / Left lower extrem less movement sec. Pain  Dialysis Access: R IJ Perm cath and LUA AVF pos. Bruit/ AVF with  Mild ecchymosis   Dialysis Orders: Center: [facility]  on TTS . EDW  75 kg just lowered 12/21/15 (still leaving below edw =73.8 1/14 and and 74.5 1/12  HD [facility] 2.0 k, 2.25 ca  Time 3 hr 38mn  Heparin 2500. Access rij perm cath and  LUA AVF(inserted  BFR 350  DFR 800    hectorol 4 mcg IV/HD  0 esa    Units IV/HD  Venofer  0  Other op lab  hgb 111.5  Ca 10.4 and phos 3.1  pth 203 10/06/16 tfs 34%   Assessment/Plan 1. Syncopal episodes/ L UE weakness  = wu in progress with Neuro eval ( MRI Acute R basal Ganglia Infarct and ? Left parietal Occipital infarct)- seems also to be chronically hypotensive  2. ESRD -  HD  TTS / no hd needs today- will plan for tomorrow 3. Hypertension/volume  -  On Midodrine  For bp support on HD/  Noted op edw lowered 1/12 and leaving below edw on 12th (only 0.7 kg gain and 14th  Only 1.1 kg gain with bp pre ok) /can dc po lasix on home med list / also on Metop 12.5 mg  XL   Tues and sat. With current bp lowish and hrt rate stable could hold - says she does not make urine so seems that EDW lowered appropriately  4. Anemia  - hgb 11.0 no esa as op or fe can dc po fe  5. Metabolic bone disease -  Op correc ca >10 hold vit d on hd  / fu phos /using phoslo  2 ac  Will use 1 ac with  op phos 3.1  6. Ho A . Fib with admit INR elevated - per admit team 7. Muscular skl pain L hip = just  back from CT scan - admit team rx  Ernest Haber, PA-C [place] [phone] 12/25/2015, 10:53 AM   Patient seen and examined, agree with above note with above modifications. Pt with ESRD-  admitted with dizziness/falls- not sure if orthtostatics completely explains- work up for other causes in progress.  Continue HD on regular schedule- pre HD midodrine and try not to be too aggressive with UF Corliss Parish, MD 12/25/2015

## 2015-12-25 NOTE — Progress Notes (Signed)
[place] for coumadin Indication: atrial fibrillation  Allergies  Allergen Reactions  . Losartan Other (See Comments)    Causes increase in creatinine and worsening CKD  . Chantix [Varenicline] Nausea Only and Other (See Comments)    Dizzy and GI symptoms     Patient Measurements: Height: 5' (152.4 cm) Weight: 160 lb 11.5 oz (72.9 kg) IBW/kg (Calculated) : 45.5  Vital Signs: Temp: 97.8 F (36.6 C) (01/16 0431) Temp Source: Oral (01/16 0431) BP: 90/40 mmHg (01/16 0431) Pulse Rate: 73 (01/16 0431)  Labs:  Recent Labs  12/24/15 1135 12/24/15 1204 12/24/15 1300 12/24/15 1307 12/25/15 0344  HGB 12.4  --   --  13.3 11.0*  HCT 37.8  --   --  39.0 34.2*  PLT 189  --   --   --  193  LABPROT  --  >50.0*  --   --  48.8*  INR  --  5.84*  --   --  5.59*  CREATININE  --   --  5.37* 5.20* 6.79*  CKTOTAL  --   --  175  --   --     Estimated Creatinine Clearance: 6.4 mL/min (by C-G formula based on Cr of 6.79).     Assessment: 75 yo female s/p fall. She is noted on coumadin for afib and pharmay has been consulted to dose. -INR= 5.59 -Patient reports a dose of 5mg /day except 2.5mg  on Tu  Home coumadin dose per clinic notes: 5mg /day except 2.5mg  on TuTh. INR was 2.39 at the last clinic visit  Goal of Therapy:  INR 2-3 Monitor platelets by anticoagulation protocol: Yes   Plan:  -Hold coumadin -Daily PT/INR  Thank you Anette Guarneri, PharmD [phone]   12/25/2015 10:02 AM

## 2015-12-25 NOTE — Clinical Documentation Improvement (Signed)
Family Medicine  Can the diagnosis of HFpEF be further specified? This algorithm is not on approved abbreviation list for [facility]. Please document findings in next progress note; NOT in BPA drop down box. Thank you!    Acuity - Acute, Chronic, Acute on Chronic   Type - Systolic, Diastolic, Systolic and Diastolic  Other  Clinically Undetermined  Document any associated diagnoses/conditions  Supporting Information:  "Holding home: Lasix and Toprol XL. Pt reports wasn't taking these last few weeks" noted in H&P   Please exercise your independent, professional judgment when responding. A specific answer is not anticipated or expected.  Thank You,  Zoila Shutter RN, BSN, [place] [phone]; Cell: [phone]

## 2015-12-25 NOTE — Progress Notes (Signed)
*  PRELIMINARY RESULTS* Echocardiogram 2D Echocardiogram has been performed.  Kristi [name] 12/25/2015, 1:59 PM

## 2015-12-25 NOTE — Consult Note (Signed)
Physical Medicine and Rehabilitation Consult Reason for Consult: Right corona radiata/basal ganglia infarct and left parietal-occipital infarct Referring Physician: Family medicine   HPI: Kristi [name] is a 75 y.o. right handed female with history of CVA, CAD with stenting, end-stage renal disease with hemodialysis since February 7092, systolic congestive heart failure, COPD, atrial fibrillation with chronic Coumadin. Lives alone using a single-point cane prior to admission. Pt does not have 24/7 support on discharge. One level home with 2 steps to entry. Presented 12/24/2015 with multiple syncopal episodes over the past month, orthostatic hypotension and left upper extremity. She was found down by her daughter.. INR upon admission of 5.64. MRI of the of the brain showed acute ischemic right corona radiata basal ganglia infarct. Left parieto-occipital encephalomalacia suggesting watershed infarct or old trauma. Echocardiogram/carotid Dopplers  pending. Cardiology workup consulted 12/25/2015 for syncope. Maintained on ProAmatine for orthostatic blood pressure. Coumadin is currently on hold. Hemodialysis as per renal services. Occupational therapy evaluation completed 12/25/2015 with recommendations of physical medicine rehabilitation consult.   Review of Systems  Constitutional: Negative for fever and chills.  HENT: Negative for hearing loss.   Eyes: Negative for blurred vision and double vision.  Respiratory: Negative for cough.        Shortness of breath with exertion  Cardiovascular: Negative for chest pain.  Gastrointestinal:       GERD  Musculoskeletal: Positive for falls.  Neurological: Positive for dizziness, sensory change, focal weakness, weakness and headaches.  Psychiatric/Behavioral: Positive for depression and memory loss.  All other systems reviewed and are negative.  Past Medical History  Diagnosis Date  . Hyperlipidemia   . Hyperparathyroidism   . CAD (coronary  artery disease)   . Myocardial infarction ([place]) 2008  . Hypertension   . COPD (chronic obstructive pulmonary disease) ([place])   . Diabetes mellitus     diagnosed 2010 - takes oral meds and lantus insulin  . Anemia   . GERD (gastroesophageal reflux disease)     on omeprazole  . Arthritis     all over  . CHF (congestive heart failure) ([place]) 08-2011  . CVA 02/19/2010  . CAD 02/19/2010  . DEEP VENOUS THROMBOPHLEBITIS, LEG, LEFT 03/08/2010    in 2009 s/p hospitalization- only 1 isolated dvt  . Pulmonary artery hypertension ([place]) 10/31/2014  . Shortness of breath dyspnea     with exertion  . Depression     years ago  . Dry skin   . Headache   . Dysrhythmia     atrial fibrillation  . Chronic kidney disease     esrd - since 2010 - DIALYIS    Past Surgical History  Procedure Laterality Date  . Av fistula placement  04/05/11    Left Radiocephalic AVF  . Appendectomy  1969  . Tubal ligation    . Revision of fisturl    . Coronary angioplasty with stent placement  06/23/2007    by Dr. Irish Lack is her cardiologist  . Ligation goretex fistula  10/29/2011    Procedure: LIGATION GORE-TEX FISTULA;  Surgeon: Elam Dutch, MD;  Location: [facility] OR;  Service: Vascular;  Laterality: Left;  Ligation of competing branches left forearm fistula  . Fistulogram N/A 02/17/2012    Procedure: FISTULOGRAM;  Surgeon: Angelia Mould, MD;  Location: [facility] CATH LAB;  Service: Cardiovascular;  Laterality: N/A;  . Insertion of dialysis catheter N/A 01/12/2015    Procedure: INSERTION OF DIALYSIS CATHETER;  Surgeon: Conrad Lakeview, MD;  Location:  [facility] OR;  Service: Vascular;  Laterality: N/A;  . Esophagogastroduodenoscopy N/A 01/20/2015    Procedure: ESOPHAGOGASTRODUODENOSCOPY (EGD);  Surgeon: Wonda Horner, MD;  Location[facility] ENDOSCOPY;  Service: Endoscopy;  Laterality: N/A;  . Eye surgery Bilateral     cataract surgery with lens implants  . Tonsillectomy    . Bascilic vein transposition Left 03/24/2015    Procedure:  LEFT ARM BASILIC VEIN TRANSPOSITION;  Surgeon: Angelia Mould, MD;  Location: [place];  Service: Vascular;  Laterality: Left;  . Ligation of arteriovenous  fistula Left 03/24/2015    Procedure: LIGATION OF left arm radio-cephalic ARTERIOVENOUS  FISTULA;  Surgeon: Angelia Mould, MD;  Location: [place];  Service: Vascular;  Laterality: Left;  . Revison of arteriovenous fistula Left 07/12/6961    Procedure: PLICATION OF BASILIC VEIN TRANSPOSITION FISTULA;  Surgeon: Angelia Mould, MD;  Location: [facility] OR;  Service: Vascular;  Laterality: Left;   Family History  Problem Relation Age of Onset  . Cancer Mother     multiple myloma  . Dementia Father   . Pneumonia Father   . Anuerysm Father    Social History:  reports that she quit smoking about 20 months ago. Her smoking use included Cigarettes. She started smoking about 55 years ago. She has a 26.5 pack-year smoking history. She has never used smokeless tobacco. She reports that she drinks alcohol. She reports that she does not use illicit drugs. Allergies:  Allergies  Allergen Reactions  . Losartan Other (See Comments)    Causes increase in creatinine and worsening CKD  . Chantix [Varenicline] Nausea Only and Other (See Comments)    Dizzy and GI symptoms    Medications Prior to Admission  Medication Sig Dispense Refill  . acetaminophen (TYLENOL) 500 MG tablet Take 1,000 mg by mouth every 6 (six) hours as needed for moderate pain.     Marland Kitchen albuterol (PROVENTIL HFA;VENTOLIN HFA) 108 (90 BASE) MCG/ACT inhaler Inhale into the lungs every 4 (four) hours as needed for wheezing.    Marland Kitchen allopurinol (ZYLOPRIM) 300 MG tablet Take 300 mg by mouth daily.  5  . calcium acetate (PHOSLO) 667 MG capsule Take 1,334 mg by mouth 3 (three) times daily with meals.     Marland Kitchen CINNAMON PO Take 1,000 mg by mouth daily.    . ferrous sulfate 325 (65 FE) MG tablet Take 325 mg by mouth daily.    . fexofenadine (ALLEGRA) 180 MG tablet Take 180 mg by mouth daily as  needed for allergies or rhinitis.    . furosemide (LASIX) 80 MG tablet Take 80-240 mg by mouth daily. Takes 3 tabs on Sun, Mon, Wed and Fri  Takes 1 tab all other days    . insulin glargine (LANTUS) 100 UNIT/ML injection Inject 0.05 mLs (5 Units total) into the skin every morning. If blood sugar is >140 add 1 unit. If blood sugar is <140 subtract 1 unit. 10 mL 2  . metoprolol succinate (TOPROL-XL) 25 MG 24 hr tablet Take 12.5 mg by mouth 2 (two) times a week. Takes on tues and thurs    . midodrine (PROAMATINE) 10 MG tablet Take 10 mg by mouth 2 (two) times daily.     . mometasone-formoterol (DULERA) 100-5 MCG/ACT AERO Inhale 2 puffs into the lungs 2 (two) times daily as needed for shortness of breath.    . nitroGLYCERIN (NITROSTAT) 0.4 MG SL tablet Place 0.4 mg under the tongue every 5 (five) minutes as needed. For chest pain  Call 911 if pain not relieved by 3rd tab    . omeprazole (PRILOSEC) 20 MG capsule Take 1 capsule (20 mg total) by mouth at bedtime. (Patient taking differently: Take 20 mg by mouth daily as needed (only takes when eating late in the night). ) 90 capsule 3  . Polyethyl Glycol-Propyl Glycol (SYSTANE OP) Apply 1-2 drops to eye 4 (four) times daily as needed. For dry eyes    . pravastatin (PRAVACHOL) 40 MG tablet TAKE 1 TABLET BY MOUTH EVERY DAY 90 tablet 3  . vitamin B-12 (CYANOCOBALAMIN) 1000 MCG tablet Take 1,000 mcg by mouth every morning.     . warfarin (COUMADIN) 5 MG tablet TAKE AS DIRECTED BY COUMADIN CLINIC (Patient taking differently: TAKES 2.5MG ON TUES ONLY, TAKES 5MG ALL OTHER DAYS) 35 tablet 3  . Blood Glucose Monitoring Suppl (ACCU-CHEK AVIVA) device Use as instructed 1 each 0  . glucose blood (ACCU-CHEK AVIVA) test strip Use as instructed 100 each 12  . Lancet Devices (ACCU-CHEK SOFTCLIX) lancets Use as instructed 100 each 12  . Lancets Misc. (ACCU-CHEK SOFTCLIX LANCET DEV) KIT 1 strip by Does not apply route 3 (three) times daily. 1 kit 0  . Omega-3 Fatty Acids  (FISH OIL) 1200 MG CAPS Take 2 capsules by mouth 2 (two) times daily.    Marland Kitchen oxyCODONE-acetaminophen (ROXICET) 5-325 MG per tablet Take 1-2 tablets by mouth every 4 (four) hours as needed for severe pain. (Patient not taking: Reported on 10/06/2015) 30 tablet 0  . Tiotropium Bromide Monohydrate 2.5 MCG/ACT AERS Inhale 2 puffs into the lungs daily. 1 Inhaler 11    Home: Home Living Family/patient expects to be discharged to:: Private residence Living Arrangements: Alone Available Help at Discharge: Family, Friend(s), Neighbor, Available PRN/intermittently Type of Home: House Home Access: Stairs to enter Technical brewer of Steps: 3 Entrance Stairs-Rails: Right, Left, Can reach both Home Layout: One level Bathroom Shower/Tub: Multimedia programmer: Handicapped height Home Equipment: Environmental consultant - 2 wheels, Shower seat (per previous admission also has cane and 4 wheeled walker)  Functional History: Prior Function Level of Independence: Needs assistance Gait / Transfers Assistance Needed: used cane and walker at times ADL's / Homemaking Assistance Needed: assist with meal prep and cleaning Functional Status:  Mobility: Bed Mobility Overal bed mobility: Needs Assistance Bed Mobility: Supine to Sit, Sit to Supine Supine to sit: Min assist Sit to supine: Supervision General bed mobility comments: assist to scoot HOB and assisted with trunk when coming to sitting position. Transfers Overall transfer level: Needs assistance Transfers: Sit to/from Stand Sit to Stand: Min assist General transfer comment: provided support at LUE      ADL: ADL Overall ADL's : Needs assistance/impaired Lower Body Dressing: Minimal assistance, Sit to/from stand Toilet Transfer: Minimal assistance, Ambulation (sit to stand from bed) Functional mobility during ADLs: Minimal assistance General ADL Comments: Encouraged pt to be using LUE. Educated on energy  conservation.  Cognition: Cognition Overall Cognitive Status: Impaired/Different from baseline Orientation Level: Oriented X4 Cognition Arousal/Alertness: Awake/alert Behavior During Therapy: WFL for tasks assessed/performed Overall Cognitive Status: Impaired/Different from baseline Area of Impairment: Memory Memory: Decreased short-term memory (pt reports this is new)  Blood pressure 115/45, pulse 76, temperature 97.4 F (36.3 C), temperature source Oral, resp. rate 16, height 5' (1.524 m), weight 72.9 kg (160 lb 11.5 oz), SpO2 95 %. Physical Exam  Vitals reviewed. Constitutional: She is oriented to person, place, and time. She appears well-developed and well-nourished.  HENT:  Head: Normocephalic and atraumatic.  Eyes: Conjunctivae and EOM are normal.  Neck: Normal range of motion. Neck supple. No thyromegaly present.  Cardiovascular:  Irregularly irregular  Respiratory: Effort normal and breath sounds normal.  GI: Soft. Bowel sounds are normal. She exhibits no distension.  Musculoskeletal: She exhibits tenderness (B/l knees). She exhibits no edema.  Neurological: She is alert and oriented to person, place, and time. No cranial nerve deficit.  Limited medical historian.  Follows simple commands Motor: RUE/RLE: 5/5 proximal to distal LUE:: 3-/5 proximal to distal LLE: hip flexion, knee extension 3/5, ankle dorsi/plantar flexion 5/5 Sensation diminished to light touch RUE/RLE DTRs b/l UE symmetric, unable to test DTRs LE due to tenderness  Skin: Skin is warm and dry.  Ecchymosis dorsal right hand  Psychiatric: She has a normal mood and affect. Her behavior is normal. Thought content normal.    Results for orders placed or performed during the hospital encounter of 12/24/15 (from the past 24 hour(s))  CG4 I-STAT (Lactic acid)     Status: None   Collection Time: 12/24/15  4:43 PM  Result Value Ref Range   Lactic Acid, Venous 1.08 0.5 - 2.0 mmol/L  Glucose, capillary      Status: None   Collection Time: 12/24/15  5:18 PM  Result Value Ref Range   Glucose-Capillary 83 65 - 99 mg/dL  TSH     Status: None   Collection Time: 12/24/15  5:30 PM  Result Value Ref Range   TSH 0.918 0.350 - 4.500 uIU/mL  Glucose, capillary     Status: Abnormal   Collection Time: 12/24/15  9:13 PM  Result Value Ref Range   Glucose-Capillary 131 (H) 65 - 99 mg/dL  Uric acid     Status: None   Collection Time: 12/24/15 10:56 PM  Result Value Ref Range   Uric Acid, Serum 3.7 2.3 - 6.6 mg/dL  Basic metabolic panel     Status: Abnormal   Collection Time: 12/25/15  3:44 AM  Result Value Ref Range   Sodium 137 135 - 145 mmol/L   Potassium 3.7 3.5 - 5.1 mmol/L   Chloride 98 (L) 101 - 111 mmol/L   CO2 27 22 - 32 mmol/L   Glucose, Bld 122 (H) 65 - 99 mg/dL   BUN 18 6 - 20 mg/dL   Creatinine, Ser 6.79 (H) 0.44 - 1.00 mg/dL   Calcium 10.2 8.9 - 10.3 mg/dL   GFR calc non Af Amer 5 (L) >60 mL/min   GFR calc Af Amer 6 (L) >60 mL/min   Anion gap 12 5 - 15  CBC     Status: Abnormal   Collection Time: 12/25/15  3:44 AM  Result Value Ref Range   WBC 6.9 4.0 - 10.5 K/uL   RBC 3.24 (L) 3.87 - 5.11 MIL/uL   Hemoglobin 11.0 (L) 12.0 - 15.0 g/dL   HCT 34.2 (L) 36.0 - 46.0 %   MCV 105.6 (H) 78.0 - 100.0 fL   MCH 34.0 26.0 - 34.0 pg   MCHC 32.2 30.0 - 36.0 g/dL   RDW 14.7 11.5 - 15.5 %   Platelets 193 150 - 400 K/uL  Protime-INR     Status: Abnormal   Collection Time: 12/25/15  3:44 AM  Result Value Ref Range   Prothrombin Time 48.8 (H) 11.6 - 15.2 seconds   INR 5.59 (HH) 0.00 - 1.49  Glucose, capillary     Status: None   Collection Time: 12/25/15  6:14 AM  Result Value Ref Range  Glucose-Capillary 95 65 - 99 mg/dL  Glucose, capillary     Status: Abnormal   Collection Time: 12/25/15 11:38 AM  Result Value Ref Range   Glucose-Capillary 147 (H) 65 - 99 mg/dL   Dg Chest 2 View  12/24/2015  CLINICAL DATA:  Vertigo.  Fall.  Chronic renal failure on dialysis. EXAM: CHEST  2 VIEW  COMPARISON:  01/19/2015 FINDINGS: Mild cardiomegaly is stable. Pulmonary hyperinflation and coarsening of interstitial lung markings is stable. No evidence of acute pulmonary infiltrate or edema. No evidence of pneumothorax or pleural effusion. Right jugular central venous dialysis catheter remains in appropriate position. IMPRESSION: Stable mild cardiomegaly.  Suspect COPD.  No active lung disease. Electronically Signed   By: Earle Gell M.D.   On: 12/24/2015 11:49   Dg Wrist Complete Left  12/24/2015  CLINICAL DATA:  Became dizzy and fell landing on her elbow, was on floor until 0900 this morning when she was found by her daughter, history dialysis EXAM: LEFT WRIST - COMPLETE 3+ VIEW COMPARISON:  None FINDINGS: Diffuse soft tissue swelling. Surgical clips at the radial aspect of distal forearm question related to dialysis access procedure. Extensive small vessel vascular calcifications. Degenerative changes first [facility] joint. No definite acute fracture, dislocation or bone destruction. IMPRESSION: Osseous demineralization with degenerative changes at first [facility] joint. No definite acute bony abnormalities. Electronically Signed   By: Lavonia Dana M.D.   On: 12/24/2015 11:45   Ct Head Wo Contrast  12/24/2015  CLINICAL DATA:  Pt states she became dizzy and fell, was on floor until 9 am. Hit side of head, also c/o neck stiffness Pt on blood thinners Hx of vertigo, dialysis, CVA, CHF, HTN, DM EXAM: CT HEAD WITHOUT CONTRAST CT CERVICAL SPINE WITHOUT CONTRAST TECHNIQUE: Multidetector CT imaging of the head and cervical spine was performed following the standard protocol without intravenous contrast. Multiplanar CT image reconstructions of the cervical spine were also generated. COMPARISON:  CT head 01/16/2010. FINDINGS: CT HEAD FINDINGS No evidence for acute infarction, hemorrhage, mass lesion, hydrocephalus, or extra-axial fluid. Generalized atrophy. Remote LEFT parietal infarct. Generalized hypoattenuation of white  matter representing small vessel disease. No skull fracture is evident. There is moderate vascular calcification. No sinus or mastoid air fluid level. BILATERAL cataract extraction. CT CERVICAL SPINE FINDINGS There is no visible cervical spine fracture, traumatic subluxation, prevertebral soft tissue swelling, or intraspinal hematoma. Advanced disc space narrowing of a chronic nature at C3-4, C5-6, and C6-7. No neck masses. No lung apex lesion. Atherosclerosis. Dual lumen dialysis catheter from a RIGHT IJ approach. No worrisome osseous lesions. IMPRESSION: Atrophy and small vessel disease.  Remote LEFT parietal infarct. No skull fracture or intracranial hemorrhage. Advanced cervical spondylosis.  Extensive atherosclerosis. No cervical spine fracture or traumatic subluxation. Electronically Signed   By: Staci Righter M.D.   On: 12/24/2015 11:17   Ct Cervical Spine Wo Contrast  12/24/2015  CLINICAL DATA:  Pt states she became dizzy and fell, was on floor until 9 am. Hit side of head, also c/o neck stiffness Pt on blood thinners Hx of vertigo, dialysis, CVA, CHF, HTN, DM EXAM: CT HEAD WITHOUT CONTRAST CT CERVICAL SPINE WITHOUT CONTRAST TECHNIQUE: Multidetector CT imaging of the head and cervical spine was performed following the standard protocol without intravenous contrast. Multiplanar CT image reconstructions of the cervical spine were also generated. COMPARISON:  CT head 01/16/2010. FINDINGS: CT HEAD FINDINGS No evidence for acute infarction, hemorrhage, mass lesion, hydrocephalus, or extra-axial fluid. Generalized atrophy. Remote LEFT parietal infarct. Generalized hypoattenuation  of white matter representing small vessel disease. No skull fracture is evident. There is moderate vascular calcification. No sinus or mastoid air fluid level. BILATERAL cataract extraction. CT CERVICAL SPINE FINDINGS There is no visible cervical spine fracture, traumatic subluxation, prevertebral soft tissue swelling, or intraspinal  hematoma. Advanced disc space narrowing of a chronic nature at C3-4, C5-6, and C6-7. No neck masses. No lung apex lesion. Atherosclerosis. Dual lumen dialysis catheter from a RIGHT IJ approach. No worrisome osseous lesions. IMPRESSION: Atrophy and small vessel disease.  Remote LEFT parietal infarct. No skull fracture or intracranial hemorrhage. Advanced cervical spondylosis.  Extensive atherosclerosis. No cervical spine fracture or traumatic subluxation. Electronically Signed   By: Staci Righter M.D.   On: 12/24/2015 11:17   Mri Brain Without Contrast  12/24/2015  ADDENDUM REPORT: 12/24/2015 22:07 ADDENDUM: Possibly stenotic distal RIGHT M1 segment and, possible slow flow or occlusion LEFT vertebral artery ; findings would be better characterized on MRA of the head. Electronically Signed   By: Elon Alas M.D.   On: 12/24/2015 22:07  12/24/2015  CLINICAL DATA:  Multiple syncopal episodes. New RIGHT hand weakness. History of diabetes, hypertension, hyperlipidemia, stroke, chronic renal disease. EXAM: MRI HEAD WITHOUT CONTRAST TECHNIQUE: Multiplanar, multiecho pulse sequences of the brain and surrounding structures were obtained without intravenous contrast. COMPARISON:  CT head December 24, 2015 at 1044 hours FINDINGS: Subcentimeter focus of reduced diffusion RIGHT corona radiata/basal ganglia with low ADC value. Linear subcentimeter focus of reduced diffusion LEFT occipital lobe, favoring T2 shine through. Small nonspecific micro hemorrhage LEFT parietal occipital lobe associated with encephalomalacia. Small focus of encephalomalacia LEFT frontal lobe. Moderate ventriculomegaly on the basis of global parenchymal brain volume loss. Patchy to confluent supratentorial and pontine white matter FLAIR T2 hyperintensities. Old bilateral basal ganglia and bilateral thalamus lacunar infarcts. Old small RIGHT cerebellar infarcts. No midline shift, mass effect or mass lesions. No abnormal extra-axial fluid  collections. No discernible LEFT vertebral artery flow void. Suspected stenosis distal RIGHT M1 segment, though partial volume Ing female result in this appearance. Status post bilateral ocular lens implants. Visualized paranasal sinuses and mastoid air cells are well aerated. No abnormal sellar expansion. No cerebellar tonsillar ectopia. No suspicious calvarial bone marrow signal. IMPRESSION: Subcentimeter acute ischemia RIGHT corona radiata/ basal ganglia. LEFT parietal occipital encephalomalacia suggests watershed infarct or old trauma. Small area LEFT frontal lobe encephalomalacia. Moderate chronic small vessel ischemic disease. Old bilateral basal ganglia and thalamus lacunar infarcts. Old small RIGHT cerebellar infarcts. Nonvisualized LEFT vertebral artery flow void, this could represent RIGHT dominance or, slow flow/occlusion. Electronically Signed: By: Elon Alas M.D. On: 12/24/2015 20:58   Ct Hip Left Wo Contrast  12/25/2015  CLINICAL DATA:  Severe left hip pain since a fall yesterday. EXAM: CT OF THE LEFT HIP WITHOUT CONTRAST TECHNIQUE: Multidetector CT imaging of the left hip was performed according to the standard protocol. Multiplanar CT image reconstructions were also generated. COMPARISON:  Radiographs dated 12/24/2015 FINDINGS: There is no fracture or dislocation or bone destruction. Slight arthritic changes of the left sacroiliac joint. Extensive iliac atherosclerosis. No soft tissue hematomas. IMPRESSION: No acute abnormality of the left hip. Electronically Signed   By: Lorriane Shire M.D.   On: 12/25/2015 11:36   Dg Hip Unilat With Pelvis 2-3 Views Left  12/24/2015  CLINICAL DATA:  Vertigo.  Fall. EXAM: DG HIP (WITH OR WITHOUT PELVIS) 2-3V LEFT COMPARISON:  None. FINDINGS: Osteopenia. No acute fracture. No dislocation. Prominent vascular calcifications. Mild degenerative change in the hip joints. IMPRESSION:  No acute bony pathology. Electronically Signed   By: Marybelle Killings M.D.   On:  12/24/2015 11:47    Assessment/Plan: Diagnosis: Right corona radiata/basal ganglia infarct and left parietal-occipital infarct Labs and images independently reviewed.  Records reviewed and summated above. Stroke: Continue secondary stroke prophylaxis and Risk Factor Modification listed below:   Antiplatelet therapy:   Blood Pressure Management:  Continue current medication with prn's with permisive HTN per primary team Statin Agent:   Left sided hemiparesis: fit for orthotics to prevent contractures (resting hand splint for day, wrist cock up splint at night, etc)  Motor recovery: Fluoxetine  1. Does the need for close, 24 hr/day medical supervision in concert with the patient's rehab needs make it unreasonable for this patient to be served in a less intensive setting? Potentially 2. Co-Morbidities requiring supervision/potential complications: hx of CVA, CAD with stenting (cont meds), end-stage renal disease with hemodialysis (cont recs per Nephro), systolic congestive heart failure (Monitor in accordance with increased physical activity and avoid UE resistance excercises), COPD (Monitor O2 Sats and RR with increased physical activity), atrial fibrillation with chronic Coumadin (monitor HR with increased physical exertion), orthostatic hypotension (consider TEDS and/or abdominal binder, cont meds), Macrocytic anemia (transfuse if necessary to ensure appropriate perfusion for increased activity tolerance) 3. Due to safety, skin/wound care, disease management and patient education, does the patient require 24 hr/day rehab nursing? Yes 4. Does the patient require coordinated care of a physician, rehab nurse, PT (1-2 hrs/day, 5 days/week) and OT (1-2 hrs/day, 5 days/week) to address physical and functional deficits in the context of the above medical diagnosis(es)? Yes Addressing deficits in the following areas: endurance, locomotion, strength, transferring, dressing, toileting and psychosocial  support 5. Can the patient actively participate in an intensive therapy program of at least 3 hrs of therapy per day at least 5 days per week? Potentially 6. The potential for patient to make measurable gains while on inpatient rehab is excellent 7. Anticipated functional outcomes upon discharge from inpatient rehab are TBD  with PT, modified independent with OT, TBD with SLP. 8. Estimated rehab length of stay to reach the above functional goals is: TBD 9. Does the patient have adequate social supports and living environment to accommodate these discharge functional goals? Potentially 10. Anticipated D/C setting: TBD 11. Anticipated post D/C treatments: HH therapy and Home excercise program 12. Overall Rehab/Functional Prognosis: good  RECOMMENDATIONS: This patient's condition is appropriate for continued rehabilitative care in the following setting: Will await formal PT consult prior to determining most appropriate discharge disposition.  Pt does not appear to have 24/7 support on discharge and will need to be able to achive a Mod I level of functioning priior to discharge.  Would also need pt to be medically stable with therapeutic INR.INR Patient has agreed to participate in recommended program. Yes Note that insurance prior authorization may be required for reimbursement for recommended care.  Comment: Rehab Admissions Coordinator to follow up.  Delice Lesch, MD 12/25/2015

## 2015-12-25 NOTE — Progress Notes (Signed)
Family Medicine Teaching Service Daily Progress Note Intern Pager: [phone]  Patient name: Kristi [name] Medical record number: [id number] Date of birth: 08/28/1941 Age: 75 y.o. Gender: female  Primary Care Provider: Georges Lynch, MD Consultants: Neurology, nephrology, cardiology Code Status: Full   Pt Overview and Major Events to Date:  1/15: Admitted to FPTS  Assessment and Plan: Kristi [name] is a 75 y.o. female presenting with multiple syncopal episodes over the past month, now found to have CVA. PMH is significant for ESRD on dialysis, CAD s/p stent, CHF, DM, COPD, HLD, osteoporosis, Gout, PAH, Afib.   Syncope: Hx of multiple syncopal episodes over past several weeks. Could be multiple possible etiologies. History most consistent with orthostatic hypotension (BP 90-100s/40-50s in ED, episode occur with position changes, and reports taking several different pain medications recently due to MSK pains (One Rx was 75 yrs old). DDx includes: Cardiac arrhythmias (known A. Fib and CAD s/p stent and she reports not taking her beta blocker or diuretics for several weeks due to low blood pressures during dialysis); Hypoglycemia (multiple recent hypoglycemic readings and confusion about DM medications), Vertigo (reports spinning sensation). Initial Trop neg. CT Head negative. EKG: 1st degree heart block. ECHO 09/2015: Mild LVH, EF 60%, Mod PAH.  - telemetry - Orthostatics: Ordered. S/p 500cc bolus in ED - Consult Cardiology - PT/OT consults  Acute CVA: Left grip strength weakness prompted MRI shows acute right basal ganglia infarct and question of left parietal occipital infarct. On coumadin with INR of 5.59 on admission. - Nephrology consulted, appreciate their assistance and recommendations - Echocardiogram, Carotid US, and possible MRAof the brain without contrast - Frequent neuro checks  MSK Pain: Pain likely related to multiple falls. History of osteoporosis and gout. X-rays of left wrist  and left tibia/fibula were Negative for fracture. Multiple ecchymosis without swelling. CT head and neck Negative. Left leg pain with no obvious swelling or ecchymosis. Recent x-ray and lower extremity Dopplers negative. History of DVT in left leg. Uric acid WNL. - CT Hip: Ordered  - Norco: 5/325 q6hrs prn - Continue home Allopurinol 300mg  qd - Consider additional evaluation of left leg pain if not improving with rest  Cardiac: HFpEF (09/2015: Mild LVH, EF 60%, Mod PAH), CAD s/p inferior MI and stent in 06/2007, Afib on Warfarin, HTN, HLD, Hx of DVT.  - Holding home: Lasix and Toprol XL. Pt reports wasn't taking these last few weeks - Warfarin per pharm; Holding while INR elevated - Continue Home: Pravastatin 40mg  qhs  ESRD: dialysis T, Th, Sa - Continue home phoslo - Consulted Renal, appreciate    DM with retinopathy: Multiple recent hypoglycemic episodes. A1c 5.4 11/2015 - Discontinue Glipizide and Lantus - CBGs qAC  COPD:  - Continue home Dulera BID, albuterol every 4 prn  Social: Lives alone. Daughters call daily and have a camera in the house. Camera was not working when patient fell.  - PT/OT recs  FEN/GI: Heart healthy; SLIV s/p 500 cc bolus in ED Prophylaxis: Warfarin per pharm for Afib  Disposition: Discharge home versus SNF pending evaluation of syncopal episodes and PT, OT recs  Subjective:  - Pt still complaining of left wrist pain and leg pain, likely secondary to falling - Denies any dizziness while sitting, only feels dizzy when ambulating - Good appetite, able to swallow  Objective: Temp:  [97.8 F (36.6 C)-98.2 F (36.8 C)] 97.8 F (36.6 C) (01/16 0431) Pulse Rate:  [73-80] 73 (01/16 0431) Resp:  [12-19] 16 (01/16 0431) BP: (  90-126)/(36-58) 90/40 mmHg (01/16 0431) SpO2:  [87 %-100 %] 92 % (01/16 0431) Weight:  [160 lb 11.5 oz (72.9 kg)-162 lb 4.1 oz (73.6 kg)] 160 lb 11.5 oz (72.9 kg) (01/16 0431) Physical Exam: General: In NAD, sitting up in chair  eating breakfast Cardiovascular: regular rate and rhythm, normal s1 and s2, no murmurs. 2+ dorsalis pedis pulse bilaterally Respiratory: normal work of breathing, clear to auscultation bilaterally Abdomen: soft, non distended, non tender, normal bowel sounds Extremities: no edema Neuro: CN 2-12 intact. 4/5 strength in left arm, 5/5 strength in right arm. Will move legs when asked but does not move against force due to pain. Sensation intact throughout. Normal nose to finger test.  MSK: Tenderness to palpation of right wrist, left wrist, left hip, left knee and leg. No hip pain with internal or external rotation of the legs bilaterally Skin: Multiple ecchymosis on right wrist, left wrist, left hip  Laboratory:  Recent Labs Lab 12/24/15 1135 12/24/15 1307 12/25/15 0344  WBC 9.3  --  6.9  HGB 12.4 13.3 11.0*  HCT 37.8 39.0 34.2*  PLT 189  --  193    Recent Labs Lab 12/24/15 1300 12/24/15 1307 12/25/15 0344  NA 136 135 137  K 4.6 4.7 3.7  CL 94* 95* 98*  CO2 28  --  27  BUN 14 21* 18  CREATININE 5.37* 5.20* 6.79*  CALCIUM 10.5*  --  10.2  PROT 5.7*  --   --   BILITOT 0.9  --   --   ALKPHOS 104  --   --   ALT 7*  --   --   AST 29  --   --   GLUCOSE 100* 94 122*    INR: 5.59  Imaging/Diagnostic Tests: Dg Chest 2 View  12/24/2015  CLINICAL DATA:  Vertigo.  Fall.  Chronic renal failure on dialysis. EXAM: CHEST  2 VIEW COMPARISON:  01/19/2015 FINDINGS: Mild cardiomegaly is stable. Pulmonary hyperinflation and coarsening of interstitial lung markings is stable. No evidence of acute pulmonary infiltrate or edema. No evidence of pneumothorax or pleural effusion. Right jugular central venous dialysis catheter remains in appropriate position. IMPRESSION: Stable mild cardiomegaly.  Suspect COPD.  No active lung disease. Electronically Signed   By: Earle Gell M.D.   On: 12/24/2015 11:49   Dg Wrist Complete Left  12/24/2015  CLINICAL DATA:  Became dizzy and fell landing on her  elbow, was on floor until 0900 this morning when she was found by her daughter, history dialysis EXAM: LEFT WRIST - COMPLETE 3+ VIEW COMPARISON:  None FINDINGS: Diffuse soft tissue swelling. Surgical clips at the radial aspect of distal forearm question related to dialysis access procedure. Extensive small vessel vascular calcifications. Degenerative changes first CMC joint. No definite acute fracture, dislocation or bone destruction. IMPRESSION: Osseous demineralization with degenerative changes at first [facility] joint. No definite acute bony abnormalities. Electronically Signed   By: Lavonia Dana M.D.   On: 12/24/2015 11:45   Ct Head Wo Contrast  12/24/2015  CLINICAL DATA:  Pt states she became dizzy and fell, was on floor until 9 am. Hit side of head, also c/o neck stiffness Pt on blood thinners Hx of vertigo, dialysis, CVA, CHF, HTN, DM EXAM: CT HEAD WITHOUT CONTRAST CT CERVICAL SPINE WITHOUT CONTRAST TECHNIQUE: Multidetector CT imaging of the head and cervical spine was performed following the standard protocol without intravenous contrast. Multiplanar CT image reconstructions of the cervical spine were also generated. COMPARISON:  CT head 01/16/2010.  FINDINGS: CT HEAD FINDINGS No evidence for acute infarction, hemorrhage, mass lesion, hydrocephalus, or extra-axial fluid. Generalized atrophy. Remote LEFT parietal infarct. Generalized hypoattenuation of white matter representing small vessel disease. No skull fracture is evident. There is moderate vascular calcification. No sinus or mastoid air fluid level. BILATERAL cataract extraction. CT CERVICAL SPINE FINDINGS There is no visible cervical spine fracture, traumatic subluxation, prevertebral soft tissue swelling, or intraspinal hematoma. Advanced disc space narrowing of a chronic nature at C3-4, C5-6, and C6-7. No neck masses. No lung apex lesion. Atherosclerosis. Dual lumen dialysis catheter from a RIGHT IJ approach. No worrisome osseous lesions. IMPRESSION:  Atrophy and small vessel disease.  Remote LEFT parietal infarct. No skull fracture or intracranial hemorrhage. Advanced cervical spondylosis.  Extensive atherosclerosis. No cervical spine fracture or traumatic subluxation. Electronically Signed   By: Staci Righter M.D.   On: 12/24/2015 11:17   Ct Cervical Spine Wo Contrast  12/24/2015  CLINICAL DATA:  Pt states she became dizzy and fell, was on floor until 9 am. Hit side of head, also c/o neck stiffness Pt on blood thinners Hx of vertigo, dialysis, CVA, CHF, HTN, DM EXAM: CT HEAD WITHOUT CONTRAST CT CERVICAL SPINE WITHOUT CONTRAST TECHNIQUE: Multidetector CT imaging of the head and cervical spine was performed following the standard protocol without intravenous contrast. Multiplanar CT image reconstructions of the cervical spine were also generated. COMPARISON:  CT head 01/16/2010. FINDINGS: CT HEAD FINDINGS No evidence for acute infarction, hemorrhage, mass lesion, hydrocephalus, or extra-axial fluid. Generalized atrophy. Remote LEFT parietal infarct. Generalized hypoattenuation of white matter representing small vessel disease. No skull fracture is evident. There is moderate vascular calcification. No sinus or mastoid air fluid level. BILATERAL cataract extraction. CT CERVICAL SPINE FINDINGS There is no visible cervical spine fracture, traumatic subluxation, prevertebral soft tissue swelling, or intraspinal hematoma. Advanced disc space narrowing of a chronic nature at C3-4, C5-6, and C6-7. No neck masses. No lung apex lesion. Atherosclerosis. Dual lumen dialysis catheter from a RIGHT IJ approach. No worrisome osseous lesions. IMPRESSION: Atrophy and small vessel disease.  Remote LEFT parietal infarct. No skull fracture or intracranial hemorrhage. Advanced cervical spondylosis.  Extensive atherosclerosis. No cervical spine fracture or traumatic subluxation. Electronically Signed   By: Staci Righter M.D.   On: 12/24/2015 11:17   Mri Brain Without  Contrast  12/24/2015  ADDENDUM REPORT: 12/24/2015 22:07 ADDENDUM: Possibly stenotic distal RIGHT M1 segment and, possible slow flow or occlusion LEFT vertebral artery ; findings would be better characterized on MRA of the head. Electronically Signed   By: Elon Alas M.D.   On: 12/24/2015 22:07  12/24/2015  CLINICAL DATA:  Multiple syncopal episodes. New RIGHT hand weakness. History of diabetes, hypertension, hyperlipidemia, stroke, chronic renal disease. EXAM: MRI HEAD WITHOUT CONTRAST TECHNIQUE: Multiplanar, multiecho pulse sequences of the brain and surrounding structures were obtained without intravenous contrast. COMPARISON:  CT head December 24, 2015 at 1044 hours FINDINGS: Subcentimeter focus of reduced diffusion RIGHT corona radiata/basal ganglia with low ADC value. Linear subcentimeter focus of reduced diffusion LEFT occipital lobe, favoring T2 shine through. Small nonspecific micro hemorrhage LEFT parietal occipital lobe associated with encephalomalacia. Small focus of encephalomalacia LEFT frontal lobe. Moderate ventriculomegaly on the basis of global parenchymal brain volume loss. Patchy to confluent supratentorial and pontine white matter FLAIR T2 hyperintensities. Old bilateral basal ganglia and bilateral thalamus lacunar infarcts. Old small RIGHT cerebellar infarcts. No midline shift, mass effect or mass lesions. No abnormal extra-axial fluid collections. No discernible LEFT vertebral artery flow void. Suspected  stenosis distal RIGHT M1 segment, though partial volume Ing female result in this appearance. Status post bilateral ocular lens implants. Visualized paranasal sinuses and mastoid air cells are well aerated. No abnormal sellar expansion. No cerebellar tonsillar ectopia. No suspicious calvarial bone marrow signal. IMPRESSION: Subcentimeter acute ischemia RIGHT corona radiata/ basal ganglia. LEFT parietal occipital encephalomalacia suggests watershed infarct or old trauma. Small area LEFT  frontal lobe encephalomalacia. Moderate chronic small vessel ischemic disease. Old bilateral basal ganglia and thalamus lacunar infarcts. Old small RIGHT cerebellar infarcts. Nonvisualized LEFT vertebral artery flow void, this could represent RIGHT dominance or, slow flow/occlusion. Electronically Signed: By: Elon Alas M.D. On: 12/24/2015 20:58   Dg Hip Unilat With Pelvis 2-3 Views Left  12/24/2015  CLINICAL DATA:  Vertigo.  Fall. EXAM: DG HIP (WITH OR WITHOUT PELVIS) 2-3V LEFT COMPARISON:  None. FINDINGS: Osteopenia. No acute fracture. No dislocation. Prominent vascular calcifications. Mild degenerative change in the hip joints. IMPRESSION: No acute bony pathology. Electronically Signed   By: Marybelle Killings M.D.   On: 12/24/2015 11:47     Carlyle Dolly, MD 12/25/2015, 6:05 AM PGY-1, [place] Intern pager: [phone], text pages welcome

## 2015-12-25 NOTE — Progress Notes (Signed)
VASCULAR LAB PRELIMINARY       Carotid Duplex Doppler has been completed. Bilaterally  1-39% ICA stenosis due to end diastolic. Exam difficult due to acoustic shadowing  identified in the ICA Bilaterally.    Janifer Adie, RVT, RDMS 12/25/2015, 2:14 PM

## 2015-12-25 NOTE — Progress Notes (Signed)
Rehab Admissions Coordinator Note:  Patient was screened by Retta Diones for appropriateness for an Inpatient Acute Rehab Consult.  At this time, we are recommending Inpatient Rehab consult.  Retta Diones 12/25/2015, 1:53 PM  I can be reached at [phone].

## 2015-12-26 ENCOUNTER — Inpatient Hospital Stay (HOSPITAL_COMMUNITY): Payer: Commercial Managed Care - HMO

## 2015-12-26 DIAGNOSIS — I272 Other secondary pulmonary hypertension: Secondary | ICD-10-CM

## 2015-12-26 LAB — BASIC METABOLIC PANEL
Anion gap: 12 (ref 5–15)
BUN: 29 mg/dL — AB (ref 6–20)
CALCIUM: 10.7 mg/dL — AB (ref 8.9–10.3)
CO2: 27 mmol/L (ref 22–32)
CREATININE: 8.93 mg/dL — AB (ref 0.44–1.00)
Chloride: 95 mmol/L — ABNORMAL LOW (ref 101–111)
GFR, EST AFRICAN AMERICAN: 4 mL/min — AB (ref >60)
GFR, EST NON AFRICAN AMERICAN: 4 mL/min — AB (ref >60)
Glucose, Bld: 96 mg/dL (ref 65–99)
Potassium: 4.3 mmol/L (ref 3.5–5.1)
SODIUM: 134 mmol/L — AB (ref 135–145)

## 2015-12-26 LAB — CBC
HCT: 34.6 % — ABNORMAL LOW (ref 36.0–46.0)
Hemoglobin: 11.1 g/dL — ABNORMAL LOW (ref 12.0–15.0)
MCH: 33.9 pg (ref 26.0–34.0)
MCHC: 32.1 g/dL (ref 30.0–36.0)
MCV: 105.8 fL — ABNORMAL HIGH (ref 78.0–100.0)
PLATELETS: 170 10*3/uL (ref 150–400)
RBC: 3.27 MIL/uL — ABNORMAL LOW (ref 3.87–5.11)
RDW: 15 % (ref 11.5–15.5)
WBC: 8.5 10*3/uL (ref 4.0–10.5)

## 2015-12-26 LAB — GLUCOSE, CAPILLARY
GLUCOSE-CAPILLARY: 95 mg/dL (ref 65–99)
GLUCOSE-CAPILLARY: 153 mg/dL — AB (ref 65–99)
Glucose-Capillary: 145 mg/dL — ABNORMAL HIGH (ref 65–99)

## 2015-12-26 LAB — PROTIME-INR
INR: 3.52 — AB (ref 0.00–1.49)
PROTHROMBIN TIME: 34.5 s — AB (ref 11.6–15.2)

## 2015-12-26 MED ORDER — TECHNETIUM TO 99M ALBUMIN AGGREGATED
4.2000 | Freq: Once | INTRAVENOUS | Status: AC | PRN
  Administered 2015-12-26: 4 via INTRAVENOUS

## 2015-12-26 MED ORDER — TECHNETIUM TC 99M DIETHYLENETRIAME-PENTAACETIC ACID: 31.0000 | Freq: Once | INTRAVENOUS | Status: DC | PRN

## 2015-12-26 MED ORDER — MIDODRINE HCL 5 MG PO TABS
10.0000 mg | ORAL_TABLET | Freq: Two times a day (BID) | ORAL | Status: DC
  Administered 2015-12-26 – 2015-12-28 (×6): 10 mg via ORAL
  Filled 2015-12-26 (×6): qty 2

## 2015-12-26 MED ORDER — CALCIUM ACETATE (PHOS BINDER) 667 MG PO CAPS
667.0000 mg | ORAL_CAPSULE | Freq: Three times a day (TID) | ORAL | Status: DC
  Administered 2015-12-26 – 2015-12-28 (×6): 667 mg via ORAL
  Filled 2015-12-26 (×6): qty 1

## 2015-12-26 MED ORDER — DIPHENHYDRAMINE HCL 25 MG PO CAPS
25.0000 mg | ORAL_CAPSULE | Freq: Once | ORAL | Status: AC
  Administered 2015-12-26: 25 mg via ORAL

## 2015-12-26 NOTE — Clinical Documentation Improvement (Signed)
Cardiology Family Medicine   Can the diagnosis of HFpEF be further specified? This algorithm is not on approved abbreviation list for [facility]. Please document findings in next progress note; NOT in BPA drop down box. Thank you!   Acuity - Acute, Chronic, Acute on Chronic  Type - Systolic, Diastolic, Systolic and Diastolic Other Clinically Undetermined   Other  Clinically Undetermined  Document any associated diagnoses/conditions  Supporting Information:  "Holding home: Lasix and Toprol XL. Pt reports wasn't taking these last few weeks" noted in H&P"   Please exercise your independent, professional judgment when responding. A specific answer is not anticipated or expected.  Thank You,  Zoila Shutter RN, BSN, [place] [phone]

## 2015-12-26 NOTE — Progress Notes (Signed)
Family Medicine Teaching Service Daily Progress Note Intern Pager: [phone]  Patient name: Kristi [name] Medical record number: [id number] Date of birth: 1941/02/28 Age: 75 y.o. Gender: female  Primary Care Provider: Georges Lynch, MD Consultants: Neurology, nephrology, cardiology Code Status: Full   Pt Overview and Major Events to Date:  1/15: Admitted to FPTS  Assessment and Plan: Kristi [name] is a 75 y.o. female presenting with multiple syncopal episodes over the past month, now found to have CVA. PMH is significant for ESRD on dialysis, CAD s/p stent, CHF, DM, COPD, HLD, osteoporosis, Gout, PAH, Afib.   Possible syncope/increased falls over last coupe weeks: Hx of multiple syncopal episodes over past several weeks. Could be multiple possible etiologies. History most consistent with orthostatic hypotension (BP 90-100s/40-50s in ED, episode occur with position changes, and reports taking several different pain medications recently due to MSK pains (One Rx was 75 yrs old). DDx includes: Cardiac arrhythmias (known A. Fib and CAD s/p stent and she reports not taking her beta blocker or diuretics for several weeks due to low blood pressures during dialysis); Hypoglycemia (multiple recent hypoglycemic readings and confusion about DM medications), Vertigo (reports spinning sensation). Initial Trop neg. CT Head negative. EKG: 1st degree heart block. ECHO 09/2015: Mild LVH, EF 60%, Mod PAH. Orthostatic vitals here have been normal - telemetry - Cardiology consulted, appreciate their assistance - PT/OT consults--> CIR  Acute CVA: Left grip strength weakness prompted MRI shows acute right basal ganglia infarct and question of left parietal occipital infarct. On coumadin with INR of 5.59 on admission. - Nephrology consulted, appreciate their assistance and recommendations - Echocardiogram, Carotid US, and possible MRAof the brain without contrast - Frequent neuro checks  MSK Pain: Pain likely related  to multiple falls. History of osteoporosis and gout. X-rays of left wrist and left tibia/fibula were Negative for fracture. Multiple ecchymosis without swelling. CT head and neck Negative. Left leg pain with no obvious swelling or ecchymosis. Recent x-ray and lower extremity Dopplers negative. History of DVT in left leg. Uric acid WNL. CT Hip WNL.  - Norco: 5/325 q6hrs prn - Continue home Allopurinol 300mg  qd - Consider additional evaluation of left leg pain if not improving with rest  Cardiac: HFpEF (09/2015: Mild LVH, EF 60%, Mod PAH), CAD s/p inferior MI and stent in 06/2007, Afib on Warfarin, HTN, HLD, Hx of DVT.  - Holding home: Lasix and Toprol XL. Pt reports wasn't taking these last few weeks - Warfarin per pharm; Holding while INR elevated - Continue Home: Pravastatin 40mg  qhs  ESRD: dialysis T, Th, Sa - Continue home phoslo - Consulted Renal, appreciate their assistance - HD today   DM with retinopathy: Multiple recent hypoglycemic episodes. A1c 5.4 11/2015 - Discontinue Glipizide and Lantus - CBGs qAC  COPD:  - Continue home Dulera BID, albuterol every 4 prn  Social: Lives alone. Daughters call daily and have a camera in the house. Camera was not working when patient fell.  - PT/OT recs--> CIR  FEN/GI: Heart healthy; SLIV s/p 500 cc bolus in ED Prophylaxis: Warfarin per pharm for Afib  Disposition: Discharge CIR when medically stable  Subjective:  - deferred as patient was in HD session  Objective: Temp:  [97.4 F (36.3 C)-98.2 F (36.8 C)] 98.2 F (36.8 C) (01/17 0443) Pulse Rate:  [68-76] 68 (01/17 0443) Resp:  [16-17] 17 (01/17 0443) BP: (84-115)/(45-57) 84/57 mmHg (01/17 0443) SpO2:  [95 %-98 %] 97 % (01/17 0443) Weight:  [166 lb 7.2 oz (75.5  kg)] 166 lb 7.2 oz (75.5 kg) (01/17 0443) Physical Exam: Deferred as patient was in HD session  Laboratory:  Recent Labs Lab 12/24/15 1135 12/24/15 1307 12/25/15 0344 12/26/15 0537  WBC 9.3  --  6.9 8.5   HGB 12.4 13.3 11.0* 11.1*  HCT 37.8 39.0 34.2* 34.6*  PLT 189  --  193 170    Recent Labs Lab 12/24/15 1300 12/24/15 1307 12/25/15 0344 12/26/15 0537  NA 136 135 137 134*  K 4.6 4.7 3.7 4.3  CL 94* 95* 98* 95*  CO2 28  --  27 27  BUN 14 21* 18 29*  CREATININE 5.37* 5.20* 6.79* 8.93*  CALCIUM 10.5*  --  10.2 10.7*  PROT 5.7*  --   --   --   BILITOT 0.9  --   --   --   ALKPHOS 104  --   --   --   ALT 7*  --   --   --   AST 29  --   --   --   GLUCOSE 100* 94 122* 96    INR: 5.59  Imaging/Diagnostic Tests: Ct Hip Left Wo Contrast  12/25/2015  CLINICAL DATA:  Severe left hip pain since a fall yesterday. EXAM: CT OF THE LEFT HIP WITHOUT CONTRAST TECHNIQUE: Multidetector CT imaging of the left hip was performed according to the standard protocol. Multiplanar CT image reconstructions were also generated. COMPARISON:  Radiographs dated 12/24/2015 FINDINGS: There is no fracture or dislocation or bone destruction. Slight arthritic changes of the left sacroiliac joint. Extensive iliac atherosclerosis. No soft tissue hematomas. IMPRESSION: No acute abnormality of the left hip. Electronically Signed   By: Lorriane Shire M.D.   On: 12/25/2015 11:36     Carlyle Dolly, MD 12/26/2015, 8:17 AM PGY-1, [place] Intern pager: [phone], text pages welcome

## 2015-12-26 NOTE — Progress Notes (Signed)
Rehab admissions - I spoke with patient's daughter by phone.  Daughter will talk with patient about rehab options.  One daughter lives in [place] and the other daughter at [place].  Patient likely to choose SNF locally rather that inpatient rehab per daughter.  Dtr to call me tomorrow after she speaks with patient.  Patient has [facility] and they will not pay for inpatient rehab and SNF.  They will pay for one or the other rehab venue.  Call me for questions.  [phone]

## 2015-12-26 NOTE — Progress Notes (Signed)
SUBJECTIVE:  No complaints  OBJECTIVE:   Vitals:   Filed Vitals:   12/26/15 0901 12/26/15 0906 12/26/15 0930 12/26/15 1000  BP: 117/43 118/68 89/40 88/51   Pulse: 60 68 62 59  Temp: 98.2 F (36.8 C)     TempSrc: Oral     Resp: 16 15 14 15   Height:      Weight: 163 lb 2.3 oz (74 kg)     SpO2: 98%      I&O's:   Intake/Output Summary (Last 24 hours) at 12/26/15 1048 Last data filed at 12/26/15 0842  Gross per 24 hour  Intake    600 ml  Output      0 ml  Net    600 ml   TELEMETRY: Reviewed telemetry pt in NSR:     PHYSICAL EXAM General: Well developed, well nourished, in no acute distress Head: Eyes PERRLA, No xanthomas.   Normal cephalic and atramatic  Lungs:   Clear bilaterally to auscultation and percussion. Heart:   HRRR S1 S2 Pulses are 2+ & equal. Abdomen: Bowel sounds are positive, abdomen soft and non-tender without masses Extremities:   No clubbing, cyanosis or edema.  DP +1 Neuro: Alert and oriented X 3. Psych:  Good affect, responds appropriately   LABS: Basic Metabolic Panel:  Recent Labs  12/25/15 0344 12/26/15 0537  NA 137 134*  K 3.7 4.3  CL 98* 95*  CO2 27 27  GLUCOSE 122* 96  BUN 18 29*  CREATININE 6.79* 8.93*  CALCIUM 10.2 10.7*   Liver Function Tests:  Recent Labs  12/24/15 1300  AST 29  ALT 7*  ALKPHOS 104  BILITOT 0.9  PROT 5.7*  ALBUMIN 2.4*   No results for input(s): LIPASE, AMYLASE in the last 72 hours. CBC:  Recent Labs  12/24/15 1135  12/25/15 0344 12/26/15 0537  WBC 9.3  --  6.9 8.5  NEUTROABS 6.9  --   --   --   HGB 12.4  < > 11.0* 11.1*  HCT 37.8  < > 34.2* 34.6*  MCV 104.4*  --  105.6* 105.8*  PLT 189  --  193 170  < > = values in this interval not displayed. Cardiac Enzymes:  Recent Labs  12/24/15 1300  CKTOTAL 175   BNP: Invalid input(s): POCBNP D-Dimer: No results for input(s): DDIMER in the last 72 hours. Hemoglobin A1C: No results for input(s): HGBA1C in the last 72 hours. Fasting Lipid  Panel: No results for input(s): CHOL, HDL, LDLCALC, TRIG, CHOLHDL, LDLDIRECT in the last 72 hours. Thyroid Function Tests:  Recent Labs  12/24/15 1730  TSH 0.918   Anemia Panel: No results for input(s): VITAMINB12, FOLATE, FERRITIN, TIBC, IRON, RETICCTPCT in the last 72 hours. Coag Panel:   Lab Results  Component Value Date   INR 3.52* 12/26/2015   INR 5.59* 12/25/2015   INR 5.84* 12/24/2015    RADIOLOGY: Dg Chest 2 View  12/24/2015  CLINICAL DATA:  Vertigo.  Fall.  Chronic renal failure on dialysis. EXAM: CHEST  2 VIEW COMPARISON:  01/19/2015 FINDINGS: Mild cardiomegaly is stable. Pulmonary hyperinflation and coarsening of interstitial lung markings is stable. No evidence of acute pulmonary infiltrate or edema. No evidence of pneumothorax or pleural effusion. Right jugular central venous dialysis catheter remains in appropriate position. IMPRESSION: Stable mild cardiomegaly.  Suspect COPD.  No active lung disease. Electronically Signed   By: Earle Gell M.D.   On: 12/24/2015 11:49   Dg Wrist Complete Left  12/24/2015  CLINICAL  DATA:  Became dizzy and fell landing on her elbow, was on floor until 0900 this morning when she was found by her daughter, history dialysis EXAM: LEFT WRIST - COMPLETE 3+ VIEW COMPARISON:  None FINDINGS: Diffuse soft tissue swelling. Surgical clips at the radial aspect of distal forearm question related to dialysis access procedure. Extensive small vessel vascular calcifications. Degenerative changes first CMC joint. No definite acute fracture, dislocation or bone destruction. IMPRESSION: Osseous demineralization with degenerative changes at first [facility] joint. No definite acute bony abnormalities. Electronically Signed   By: Lavonia Dana M.D.   On: 12/24/2015 11:45   Dg Wrist Complete Left  12/22/2015  CLINICAL DATA:  Left wrist pain and swelling after fall in home 1 week ago. Initial encounter. EXAM: LEFT WRIST - COMPLETE 3+ VIEW COMPARISON:  None. FINDINGS: There is  no evidence of fracture or dislocation. Osteoarthritis of the first carpometacarpal joint is noted. Vascular calcifications are noted. IMPRESSION: No acute abnormality seen in the left wrist. Osteoarthritis of first carpometacarpal joint is noted. Electronically Signed   By: Marijo Conception, M.D.   On: 12/22/2015 17:30   Dg Tibia/fibula Left  12/22/2015  CLINICAL DATA:  Pain after fall 1 week prior EXAM: LEFT TIBIA AND FIBULA - 2 VIEW COMPARISON:  None. FINDINGS: Frontal and lateral views were obtained. There is no demonstrable fracture or dislocation. The joint spaces appear intact. There is arterial vascular calcification. No abnormal periosteal reaction. IMPRESSION: No fracture or dislocation.  No appreciable arthropathy. Electronically Signed   By: Lowella Grip III M.D.   On: 12/22/2015 17:29   Ct Head Wo Contrast  12/24/2015  CLINICAL DATA:  Pt states she became dizzy and fell, was on floor until 9 am. Hit side of head, also c/o neck stiffness Pt on blood thinners Hx of vertigo, dialysis, CVA, CHF, HTN, DM EXAM: CT HEAD WITHOUT CONTRAST CT CERVICAL SPINE WITHOUT CONTRAST TECHNIQUE: Multidetector CT imaging of the head and cervical spine was performed following the standard protocol without intravenous contrast. Multiplanar CT image reconstructions of the cervical spine were also generated. COMPARISON:  CT head 01/16/2010. FINDINGS: CT HEAD FINDINGS No evidence for acute infarction, hemorrhage, mass lesion, hydrocephalus, or extra-axial fluid. Generalized atrophy. Remote LEFT parietal infarct. Generalized hypoattenuation of white matter representing small vessel disease. No skull fracture is evident. There is moderate vascular calcification. No sinus or mastoid air fluid level. BILATERAL cataract extraction. CT CERVICAL SPINE FINDINGS There is no visible cervical spine fracture, traumatic subluxation, prevertebral soft tissue swelling, or intraspinal hematoma. Advanced disc space narrowing of a  chronic nature at C3-4, C5-6, and C6-7. No neck masses. No lung apex lesion. Atherosclerosis. Dual lumen dialysis catheter from a RIGHT IJ approach. No worrisome osseous lesions. IMPRESSION: Atrophy and small vessel disease.  Remote LEFT parietal infarct. No skull fracture or intracranial hemorrhage. Advanced cervical spondylosis.  Extensive atherosclerosis. No cervical spine fracture or traumatic subluxation. Electronically Signed   By: Staci Righter M.D.   On: 12/24/2015 11:17   Ct Cervical Spine Wo Contrast  12/24/2015  CLINICAL DATA:  Pt states she became dizzy and fell, was on floor until 9 am. Hit side of head, also c/o neck stiffness Pt on blood thinners Hx of vertigo, dialysis, CVA, CHF, HTN, DM EXAM: CT HEAD WITHOUT CONTRAST CT CERVICAL SPINE WITHOUT CONTRAST TECHNIQUE: Multidetector CT imaging of the head and cervical spine was performed following the standard protocol without intravenous contrast. Multiplanar CT image reconstructions of the cervical spine were also generated. COMPARISON:  CT head 01/16/2010. FINDINGS: CT HEAD FINDINGS No evidence for acute infarction, hemorrhage, mass lesion, hydrocephalus, or extra-axial fluid. Generalized atrophy. Remote LEFT parietal infarct. Generalized hypoattenuation of white matter representing small vessel disease. No skull fracture is evident. There is moderate vascular calcification. No sinus or mastoid air fluid level. BILATERAL cataract extraction. CT CERVICAL SPINE FINDINGS There is no visible cervical spine fracture, traumatic subluxation, prevertebral soft tissue swelling, or intraspinal hematoma. Advanced disc space narrowing of a chronic nature at C3-4, C5-6, and C6-7. No neck masses. No lung apex lesion. Atherosclerosis. Dual lumen dialysis catheter from a RIGHT IJ approach. No worrisome osseous lesions. IMPRESSION: Atrophy and small vessel disease.  Remote LEFT parietal infarct. No skull fracture or intracranial hemorrhage. Advanced cervical  spondylosis.  Extensive atherosclerosis. No cervical spine fracture or traumatic subluxation. Electronically Signed   By: Staci Righter M.D.   On: 12/24/2015 11:17   Mri Brain Without Contrast  12/24/2015  ADDENDUM REPORT: 12/24/2015 22:07 ADDENDUM: Possibly stenotic distal RIGHT M1 segment and, possible slow flow or occlusion LEFT vertebral artery ; findings would be better characterized on MRA of the head. Electronically Signed   By: Elon Alas M.D.   On: 12/24/2015 22:07  12/24/2015  CLINICAL DATA:  Multiple syncopal episodes. New RIGHT hand weakness. History of diabetes, hypertension, hyperlipidemia, stroke, chronic renal disease. EXAM: MRI HEAD WITHOUT CONTRAST TECHNIQUE: Multiplanar, multiecho pulse sequences of the brain and surrounding structures were obtained without intravenous contrast. COMPARISON:  CT head December 24, 2015 at 1044 hours FINDINGS: Subcentimeter focus of reduced diffusion RIGHT corona radiata/basal ganglia with low ADC value. Linear subcentimeter focus of reduced diffusion LEFT occipital lobe, favoring T2 shine through. Small nonspecific micro hemorrhage LEFT parietal occipital lobe associated with encephalomalacia. Small focus of encephalomalacia LEFT frontal lobe. Moderate ventriculomegaly on the basis of global parenchymal brain volume loss. Patchy to confluent supratentorial and pontine white matter FLAIR T2 hyperintensities. Old bilateral basal ganglia and bilateral thalamus lacunar infarcts. Old small RIGHT cerebellar infarcts. No midline shift, mass effect or mass lesions. No abnormal extra-axial fluid collections. No discernible LEFT vertebral artery flow void. Suspected stenosis distal RIGHT M1 segment, though partial volume Ing female result in this appearance. Status post bilateral ocular lens implants. Visualized paranasal sinuses and mastoid air cells are well aerated. No abnormal sellar expansion. No cerebellar tonsillar ectopia. No suspicious calvarial bone marrow  signal. IMPRESSION: Subcentimeter acute ischemia RIGHT corona radiata/ basal ganglia. LEFT parietal occipital encephalomalacia suggests watershed infarct or old trauma. Small area LEFT frontal lobe encephalomalacia. Moderate chronic small vessel ischemic disease. Old bilateral basal ganglia and thalamus lacunar infarcts. Old small RIGHT cerebellar infarcts. Nonvisualized LEFT vertebral artery flow void, this could represent RIGHT dominance or, slow flow/occlusion. Electronically Signed: By: Elon Alas M.D. On: 12/24/2015 20:58   Ct Hip Left Wo Contrast  12/25/2015  CLINICAL DATA:  Severe left hip pain since a fall yesterday. EXAM: CT OF THE LEFT HIP WITHOUT CONTRAST TECHNIQUE: Multidetector CT imaging of the left hip was performed according to the standard protocol. Multiplanar CT image reconstructions were also generated. COMPARISON:  Radiographs dated 12/24/2015 FINDINGS: There is no fracture or dislocation or bone destruction. Slight arthritic changes of the left sacroiliac joint. Extensive iliac atherosclerosis. No soft tissue hematomas. IMPRESSION: No acute abnormality of the left hip. Electronically Signed   By: Lorriane Shire M.D.   On: 12/25/2015 11:36   Dg Hip Unilat With Pelvis 2-3 Views Left  12/24/2015  CLINICAL DATA:  Vertigo.  Fall. EXAM: DG HIP (  WITH OR WITHOUT PELVIS) 2-3V LEFT COMPARISON:  None. FINDINGS: Osteopenia. No acute fracture. No dislocation. Prominent vascular calcifications. Mild degenerative change in the hip joints. IMPRESSION: No acute bony pathology. Electronically Signed   By: Marybelle Killings M.D.   On: 12/24/2015 11:47    ASSESSMENT/PLAN: 1. ? Syncope- The patient adamantly denies any LOC with her falls. She has had some dizziness with change in position but no presyncope or syncope. This sounds more orthostatic in nature as it occurs mainly with changes in position from sitting to standing and also has had to hold her diuretic and BB over the past few weeks due to  low BP during HD. She denies any palpitations. Unlikely to be primary arrhythmia but will get a 30 day heart monitor to assess for bradyarrhythmias or long pauses. She does have baseline ST changes on EKG but these are unchanged in the past year. She denies any anginal symptoms. I do not think this is from coronary ischemia. 2D echo showed normal LVF with severe pulmonary HTN. Carotid dopplers are pending. Agree with Midodrine for orthostatic hypotension.  2. Paroxysmal atrial fibrillation maintaining NSR with supratherapeutic INR - coumadin on hold and INR has decreased to 3.52 today. 3. 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 Continue statin/ASA. BP too soft to resume BB. 4. HTN - on soft side 5. Dyslipidemia - continue statin 6. LUE weakness with MRI showing several new acute infarcts ( MRI Acute R basal Ganglia Infarct and ? Left parietal Occipital infarct). Neuro w/u in progress. 7. ESRD on HD 8.  Severe pulmonary HTN - ? Etiology.  She is morbidly obese so need to consider OSA.  Recommend outpt sleep study.  She also has COPD which is most likely contributing.  Will check PFTs with DLCO.  Check VQ scan to rule out chronic PE although unlikely on chronic anticoagulation.   Sueanne Margarita, MD  12/26/2015  10:48 AM

## 2015-12-26 NOTE — Clinical Social Work Note (Cosign Needed)
Clinical Social Work Assessment  Patient Details  Name: Kristi [name] MRN: [id number] Date of Birth: [birth year]  Date of referral:  12/26/15               Reason for consult:  Facility Placement                Permission sought to share information with:  Chartered certified accountant granted to share information::  Yes, Verbal Permission Granted  Name::        Agency::     Relationship::     Contact Information:     Housing/Transportation Living arrangements for the past 2 months:  Single Family Home Source of Information:  Patient Patient Interpreter Needed:  None Criminal Activity/Legal Involvement Pertinent to Current Situation/Hospitalization:  No - Comment as needed Significant Relationships:  None Lives with:  Self Do you feel safe going back to the place where you live?  Yes Need for family participation in patient care:  No (Coment)  Care giving concerns:  Patient fell at home, does not have 24 hour assistance at home. PT recommend SNF   Social Worker assessment / plan: BSW intern enters patient room, patient was up and alert.BSW intern explained PT recommendation and referral process. Patient has no family in the [place] area but she did state she has family in [place]. Patient has been to a SNF before which was [facility]. Patient stated if there are better facilities then [facility] then she would like to get place in one verses going back to [facility]. Patient stated that she do like the PT at [facility].   Employment status:  Disabled (Comment on whether or not currently receiving Disability) Insurance information:  Medicare Personnel officer) PT Recommendations:  [place] / Referral to community resources:  Acute Rehab, [place]  Patient/Family's Response to care:  Patient is agreeable with going to a SNF.  Patient/Family's Understanding of and Emotional Response to Diagnosis, Current Treatment, and Prognosis:   Patient has good insight on her conditions and understands current plan. Has no further questions or concerns at this time.  Emotional Assessment Appearance:  Appears stated age Attitude/Demeanor/Rapport:   (nice sweet) Affect (typically observed):  Accepting Orientation:  Oriented to Self, Oriented to Place, Oriented to  Time, Oriented to Situation Alcohol / Substance use:  Never Used Psych involvement (Current and /or in the community):  No (Comment)  Discharge Needs  Concerns to be addressed:  No discharge needs identified Readmission within the last 30 days:  No Current discharge risk:  None Barriers to Discharge:  No Barriers Identified   [place] intern  [phone]

## 2015-12-26 NOTE — Progress Notes (Signed)
Physical Therapy Treatment Patient Details Name: Kristi [name] MRN: [id number] DOB: Nov 17, 1941 Today's Date: 12/26/2015    History of Present Illness 75 y.o. female with hx of ESRD on HD, Chronic back painand right shoulder pain, osteoporosis, COPD, arthritis, CAD, CHF, A fib, DM, HLD admitted with multiple syncopal episodes over the past month. On admission she was found to have LUE weakness so a MRI brain was ordered. Patient reports her left arm has been weak for around 1-2 weeks. She has a history of A fib for which she is on coumadin. INR on admission was 5.64. MRI brain imaging reviewed, shows small right basal ganglia infarct, question of right M1 stenosis and occlusion/slow flow of left vertebral artery, LEFT parietal occipital encephalomalacia suggests watershed infarct or old trauma. Small area LEFT frontal lobe encephalomalacia. 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    PT Comments    Patient able to walk today in hallway with assist.  Limited due to fatigue and L side pain due to fall PTA.  Do not feel she could tolerate intensity of CIR level rehab.  Feel she needs STSNF for maximizing safety prior to d/c home alone especially due to limited cognition and pt easily frustrated with her limitations at this time.  Will continue to follow acutely.  Follow Up Recommendations  SNF     Equipment Recommendations  None recommended by PT    Recommendations for Other Services       Precautions / Restrictions Precautions Precautions: Fall    Mobility  Bed Mobility Overal bed mobility: Needs Assistance       Supine to sit: Mod assist     General bed mobility comments: to L side with pain due to fell on that side  Transfers Overall transfer level: Needs assistance Equipment used: Rolling walker (2 wheeled) Transfers: Sit to/from Stand Sit to Stand: Min assist         General transfer comment: cues for hand placement, groans due to sore L side  Ambulation/Gait Ambulation/Gait  assistance: Min assist;Mod assist Ambulation Distance (Feet): 50 Feet Assistive device: Rolling walker (2 wheeled) Gait Pattern/deviations: Step-through pattern;Trunk flexed;Shuffle     General Gait Details: assist and cues for proximity to walker, assist to turn walker, pt fatigued and needed increased help for safety.  Noted to bed pale and BP 111/47 in seated position after ambulation   Stairs            Wheelchair Mobility    Modified Rankin (Stroke Patients Only) Modified Rankin (Stroke Patients Only) Pre-Morbid Rankin Score: Moderate disability Modified Rankin: Moderately severe disability     Balance Overall balance assessment: Needs assistance Sitting-balance support: Feet supported Sitting balance-Leahy Scale: Fair       Standing balance-Leahy Scale: Poor Standing balance comment: leans forward with heavy UE support on walker                    Cognition Arousal/Alertness: Awake/alert Behavior During Therapy: Agitated Overall Cognitive Status: Impaired/Different from baseline Area of Impairment: Memory;Problem solving     Memory: Decreased short-term memory       Problem Solving: Slow processing;Requires verbal cues General Comments: agitated due to diet restrictions and unable to eat much due to broken tooth.  Have asked RN for paraffin if available to coat tooth so doesn't rip flesh on cheek.  Assisted with calling in dinner and breakfast order for pt    Exercises      General Comments  Pertinent Vitals/Pain Faces Pain Scale: Hurts even more Pain Location: L side with rolling on it Pain Descriptors / Indicators: Sore;Aching Pain Intervention(s): Limited activity within patient's tolerance;Monitored during session;Repositioned    Home Living                      Prior Function            PT Goals (current goals can now be found in the care plan section) Progress towards PT goals: Progressing toward goals     Frequency  Min 3X/week    PT Plan Discharge plan needs to be updated;Frequency needs to be updated    Co-evaluation             End of Session Equipment Utilized During Treatment: Gait belt Activity Tolerance: Patient limited by fatigue Patient left: in chair;with call bell/phone within reach;with chair alarm set     Time: 1345-1414 PT Time Calculation (min) (ACUTE ONLY): 29 min  Charges:  $Gait Training: 23-37 mins                    G Codes:      Kristi [name] January 02, 2016, 4:14 PM Kristi [name], Kristi [name] 2016-01-02

## 2015-12-26 NOTE — Progress Notes (Signed)
STROKE TEAM PROGRESS NOTE   SUBJECTIVE (INTERVAL HISTORY) Patient currently in HD. Happy under kitty blanket,   OBJECTIVE Temp:  [97.4 F (36.3 C)-98.2 F (36.8 C)] 98.2 F (36.8 C) (01/17 0901) Pulse Rate:  [60-76] 62 (01/17 0930) Cardiac Rhythm:  [-] Heart block;Bundle branch block (01/17 0700) Resp:  [14-17] 14 (01/17 0930) BP: (84-146)/(40-68) 89/40 mmHg (01/17 0930) SpO2:  [95 %-98 %] 98 % (01/17 0901) Weight:  [74 kg (163 lb 2.3 oz)-75.5 kg (166 lb 7.2 oz)] 74 kg (163 lb 2.3 oz) (01/17 0901)  CBC:   Recent Labs Lab 12/24/15 1135  12/25/15 0344 12/26/15 0537  WBC 9.3  --  6.9 8.5  NEUTROABS 6.9  --   --   --   HGB 12.4  < > 11.0* 11.1*  HCT 37.8  < > 34.2* 34.6*  MCV 104.4*  --  105.6* 105.8*  PLT 189  --  193 170  < > = values in this interval not displayed.  Basic Metabolic Panel:   Recent Labs Lab 12/25/15 0344 12/26/15 0537  NA 137 134*  K 3.7 4.3  CL 98* 95*  CO2 27 27  GLUCOSE 122* 96  BUN 18 29*  CREATININE 6.79* 8.93*  CALCIUM 10.2 10.7*    Lipid Panel:     Component Value Date/Time   CHOL 157 09/30/2014 0827   CHOL 164 04/05/2013   TRIG 166* 09/30/2014 0827   TRIG 39 04/05/2013   HDL 32* 09/30/2014 0827   CHOLHDL 4.9 09/30/2014 0827   VLDL 33 09/30/2014 0827   LDLCALC 92 09/30/2014 0827   LDLCALC 98 04/05/2013   HgbA1c:  Lab Results  Component Value Date   HGBA1C 5.4 11/20/2015   Urine Drug Screen: No results found for: LABOPIA, COCAINSCRNUR, LABBENZ, AMPHETMU, THCU, LABBARB    IMAGING  Dg Chest 2 View 12/24/2015  Stable mild cardiomegaly.  Suspect COPD.  No active lung disease.   Dg Wrist Complete Left 12/24/2015  Osseous demineralization with degenerative changes at first [facility] joint. No definite acute bony abnormalities.   Ct Head Wo Contrast 12/24/2015  Atrophy and small vessel disease.  Remote LEFT parietal infarct. No skull fracture or intracranial hemorrhage. Advanced cervical spondylosis.  Extensive atherosclerosis.    Ct Cervical Spine Wo Contrast 12/24/2015  No cervical spine fracture or traumatic subluxation.   Mri Brain Without Contrast 12/24/2015  Possibly stenotic distal RIGHT M1 segment and, possible slow flow or occlusion LEFT vertebral artery ; findings would be better characterized on MRA of the head.  Subcentimeter acute ischemia RIGHT corona radiata/ basal ganglia. LEFT parietal occipital encephalomalacia suggests watershed infarct or old trauma. Small area LEFT frontal lobe encephalomalacia. Moderate chronic small vessel ischemic disease. Old bilateral basal ganglia and thalamus lacunar infarcts. Old small RIGHT cerebellar infarcts. Nonvisualized LEFT vertebral artery flow void, this could represent RIGHT dominance or, slow flow/occlusion.   Dg Hip Unilat With Pelvis 2-3 Views Left 12/24/2015  No acute bony pathology.   Carotid Doppler   Bilaterally 1-39% ICA stenosis due to end diastolic. Exam difficult due to acoustic shadowing identified in the ICA Bilaterally.  2D Echocardiogram  - Left ventricle: The cavity size was normal. Wall thickness wasincreased in a pattern of moderate LVH. Systolic function wasnormal. The estimated ejection fraction was in the range of 55%to 60%. Wall motion was normal; there were no regional wallmotion abnormalities. Features are consistent with a pseudonormalleft ventricular filling pattern, with concomitant abnormalrelaxation and increased filling pressure (grade 2 diastolicdysfunction). Doppler parameters are consistent  with high ventricular filling pressure - Mitral valve: Calcified annulus. - Left atrium: The atrium was mildly dilated. - Pulmonary arteries: PA peak pressure: 74 mm Hg (S).   PHYSICAL EXAM Pleasant elderly lady not in distress. . Afebrile. Head is nontraumatic. Neck is supple without bruit.    Cardiac exam no murmur or gallop. Lungs are clear to auscultation. Distal pulses are well felt. Neurological Exam :  Awake alert oriented x 3  normal speech and language. Mild left lower face asymmetry. Tongue midline. Mild left sided drift with 4/5 left hemiparesis. Mild diminished fine finger movements on left. Orbits right over left upper extremity. Mild left grip weak.. Normal sensation . Normal coordination.   ASSESSMENT/PLAN [name] is a 75 y.o. female with history of ESRD on HD, CAD, CHF, A fib, DM, HLD, hx DVT, multiple falls presenting with left upper extremity weakness. She did not receive IV t-PA due to unknown last known well.   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 Left occipital infarct. Possibly stenotic right M1  MRA  Not ordered. Can do TCD to look at baseline vasculature (instead of MRA that is typically done at the same time as MRI)  Carotid Doppler  No ICA stenosis  2D Echo  No source of embolus 09/27/2015. Repeat stable.  LDL not ordered  HgbA1c 5.4 in December   Warfarin for VTE prophylaxis Diet renal/carb modified with fluid restriction Diet-HS Snack?: Nothing; Room service appropriate?: Yes; Fluid consistency:: Thin  warfarin daily prior to admission, now on warfarin daily. Do not recommend additional antiplatelets. NOACs cannot be used in renal failure  Patient counseled to be compliant with her antithrombotic medications  Ongoing aggressive stroke risk factor management  Therapy recommendations:  CIR  Disposition:  pending  (lives alone, daughter checks on daily, has a camera in the house)  Atrial Fibrillation  Home anticoagulation:  warfarin continued in the hospital  INR 5.64 on admission. Warfarin currently on hold. INR today 3.52  Continue warfarin at discharge   Hyperlipidemia  Home meds:  Pravachol 40, changed to Lipitor in hospital  LDL not ordered, goal <  70  Recommend LDL to be checked  Continue statin at discharge  Diabetes type II  HgbA1c 5.4 in December, goal < 7.0  Controlled  Other Stroke Risk Factors  Advanced age  Former Cigarette smoker, quit smoking 20 mos ago   ETOH use  Obesity, Body mass index is 31.86 kg/(m^2).   Hx stroke/TIA  02/2010 (details not available in   Coronary artery disease - MI, angioplasty with stenting in 2008  CHF  Other Active Problems  ESRD on HD  Syncope with multiple episodes over the last several weeks  Musculoskeletal pain  COPD  DM with retinopathy  NOTHING FURTHER TO ADD FROM THE STROKE STANDPOINT  Patient has a 10-15% risk of having another stroke over the next year, the highest risk is within 2 weeks of the most recent stroke/TIA (risk of having a stroke following a stroke or TIA is the same).  Ongoing risk factor control by Primary Care Physician  Stroke Service will sign off. Please call should any needs arise.  Follow-up Stroke Clinic at [facility] with Dr. Antony Contras in 2 months, order placed.    Hospital day # [facility]  See Amion for Pager information 12/26/2015 10:01 AM  I have personally examined this patient, reviewed notes, independently viewed imaging studies, participated in medical decision making and plan of care. I have made any additions or clarifications directly to the above note. Agree with note above.    Antony Contras, MD Medical Director [place] Pager: [phone] 12/26/2015 4:23 PM   To contact Stroke Continuity provider, please refer to [email or web address] After hours, contact General Neurology

## 2015-12-26 NOTE — Progress Notes (Signed)
[place] for coumadin Indication: atrial fibrillation  Allergies  Allergen Reactions  . Losartan Other (See Comments)    Causes increase in creatinine and worsening CKD  . Chantix [Varenicline] Nausea Only and Other (See Comments)    Dizzy and GI symptoms    Labs:  Recent Labs  12/24/15 1135 12/24/15 1204  12/24/15 1300 12/24/15 1307 12/25/15 0344 12/26/15 0537  HGB 12.4  --   --   --  13.3 11.0* 11.1*  HCT 37.8  --   --   --  39.0 34.2* 34.6*  PLT 189  --   --   --   --  193 170  LABPROT  --  >50.0*  --   --   --  48.8* 34.5*  INR  --  5.84*  --   --   --  5.59* 3.52*  CREATININE  --   --   < > 5.37* 5.20* 6.79* 8.93*  CKTOTAL  --   --   --  175  --   --   --   < > = values in this interval not displayed.  Estimated Creatinine Clearance: 4.9 mL/min (by C-G formula based on Cr of 8.93).  Assessment: 75 yo female s/p fall. She is noted on coumadin for afib and pharmay has been consulted to dose. -INR on admission = 5.84, INR trending down = 3.52 today -Patient reports a dose of 5mg /day except 2.5mg  on Tu  Home coumadin dose per clinic notes: 5mg /day except 2.5mg  on TuTh. INR was 2.39 at the last clinic visit  Goal of Therapy:  INR 2-3 Monitor platelets by anticoagulation protocol: Yes   Plan:  Continue to hold Coumadin Daily PT/INR  Thank you Anette Guarneri, PharmD [phone]   12/26/2015 10:30 AM

## 2015-12-26 NOTE — Procedures (Signed)
Tolerating HD BP acceptable. Got midodrine pre treatment. Goal 1500.  Using AVF with no trouble at 250cc/min,  Green light.  Still has right chest PC> Callista Hoh C

## 2015-12-26 NOTE — Discharge Summary (Signed)
[facility] Discharge Summary  Patient name: Kristi [name] Medical record number: [id number] Date of birth: 01/19/1941 Age: 75 y.o. Gender: female Date of Admission: 12/24/2015  Date of Discharge: 12/28/15 Admitting Physician: Dickie La, MD  Primary Care Provider: Georges Lynch, MD Consultants: Neurology, cardiology  Indication for Hospitalization: Syncope  Discharge Diagnoses/Problem List:  Patient Active Problem List   Diagnosis Date Noted  . Hemiparesis affecting left side as late effect of stroke ([place])   . History of CVA (cerebrovascular accident)   . Chronic systolic congestive heart failure ([place])   . Chronic obstructive pulmonary disease ([place])   . Paroxysmal atrial fibrillation (H[facility])   . Orthostasis   . Macrocytic anemia   . Cerebral infarction ([place])   . Syncope 12/24/2015  . Hip pain   . ESRD on dialysis ([place])   . Fall   . Near syncope   . Loss of weight   . Appetite loss   . Constipation   . Wrist pain, acute 12/22/2015  . Pain of left lower leg 12/22/2015  . Encounter for therapeutic drug monitoring 03/17/2015  . Thrombocytopenia ([place])   . Gastric outlet obstruction   . ESRD needing dialysis ([place])   . Atrial fibrillation ([place]) 01/13/2015  . Proliferative diabetic retinopathy ([place]) 12/15/2014  . Pulmonary artery hypertension ([place]) 10/31/2014  . Chronic kidney disease (CKD), stage IV (severe) ([place])   . Acute on chronic diastolic HF (heart failure) ([place])   . Essential hypertension, benign   . Chronic right shoulder pain 09/16/2014  . Coronary atherosclerosis of native coronary artery 04/06/2014  . Old myocardial infarction 04/06/2014  . Gout 04/01/2014  . Chronic back pain 07/21/2013  . CHF (congestive heart failure) ([place]) 11/01/2012  . Diabetic retinopathy associated with type 2 diabetes mellitus ([place]) 09/29/2012  . Osteoporosis 09/11/2012  . Lower extremity edema 09/20/2011  . Obesity 09/20/2011  . History of tobacco abuse  01/11/2011  . MONOCLONAL GAMMOPATHY 07/02/2010  . GERD 03/26/2010  . HYPERLIPIDEMIA 02/19/2010  . COPD (chronic obstructive pulmonary disease) ([place]) 02/19/2010   Disposition: SNF  Discharge Condition: Stable  Discharge Exam: see previous progress note  Brief Hospital Course:  KRISTL MORIOKA is a 75 y.o. female presenting with multiple syncopal episodes over the past month. PMH is significant for ESRD on dialysis, CAD s/p stent, CHF, DM, COPD, HLD, osteoporosis, Gout, PAH, Afib.   Initially presented with concern for syncope and was found to be orthostatic. During the initial exam she was found to have some left extremity weakness. A CT of her head was negative but an MRI showed an acute right basal ganglia infarct and question of a left parietal occipital infarct. Neurology was consult and for stroke management. An echo showed no source of embolus from her prior study on 09/27/2015. Echo on 12/25/2015 showing 55-60% ejection fraction with grade 2 diastolic dysfunction. Her echo also showed severe pulmonary hypertension. Her hemoglobin A1c was found to be 5.1 in December. Carotid Dopplers did not show any significant stenosis. Neurology and cardiology advised to continue the statin and aspirin. It was determined that most likely her syncope was multifactorial and that cardiology had recommended a 30 day Holter monitor to evaluate for arrhythmia for which she will be discharged on.  She has a history of paroxysmal atrial fibrillation and her INR was supratherapeutic prior to admission. Her warfarin was held during admission due to this. She had a VQ scan done that showed normal ventilation and perfusion of the lungs.  Due to her end-stage age renal disease warfarin was her only choice for anticoagulation. This is to be continued upon discharge.  There is also concern of a left hip fracture as she has a history of multiple falls. A CT of her hip was ordered and was negative for any acute  findings.  Patient was continued on hemodialysis for her end-stage renal disease but was given midodrine prior to HD for her hypotension. Due to these episodes of hypotension she will discontinue the beta blocker upon discharge and continue taking Midodrine.  Issues for Follow Up:  1. Severe pulmonary hypertension: Consider sleep study to evaluate for obstructive sleep apnea secondary to morbid obesity 2. Will need to follow up with Cardiology and will get a 30 day Holter monitor outpatient 3. Hypotension: Will discontinue beta blocker and continue Midodrine 4. Anticoagulation: Continue Coumadin on discharge. Will require 5 mg coumadin daily. Will need INR checked on 1/23.  5. Stopped home Lantus as patient had history of multiple hypoglycemic events and last A1C was 5.1 in December   Significant Procedures: HD on 1/17 and 1/19  Significant Labs and Imaging:   Recent Labs Lab 12/26/15 0537 12/27/15 1519 12/28/15 0915  WBC 8.5 8.9 8.5  HGB 11.1* 11.2* 11.1*  HCT 34.6* 33.8* 34.4*  PLT 170 159 189    Recent Labs Lab 12/24/15 1300  12/25/15 0344 12/26/15 0537 12/27/15 0447 12/27/15 1519 12/28/15 0915  NA 136  < > 137 134* 133* 133* 131*  K 4.6  < > 3.7 4.3 4.0 4.3 4.5  CL 94*  < > 98* 95* 95* 93* 93*  CO2 28  --  _0 GLUCOSE 100*  < > 122* 96 88 138* 132*  BUN 14  < > 18 29* 16 23* 32*  CREATININE 5.37*  < > 6.79* 8.93* 5.60* 6.38* 7.64*  CALCIUM 10.5*  --  10.2 10.7* 9.6 10.3 10.4*  PHOS  --   --   --   --   --  5.1* 5.6*  ALKPHOS 104  --   --   --   --   --   --   AST 29  --   --   --   --   --   --   ALT 7*  --   --   --   --   --   --   ALBUMIN 2.4*  --   --   --   --  2.1* 2.3*  < > = values in this interval not displayed.  Dg Chest 2 View  12/24/2015  CLINICAL DATA:  Vertigo.  Fall.  Chronic renal failure on dialysis. EXAM: CHEST  2 VIEW COMPARISON:  01/19/2015 FINDINGS: Mild cardiomegaly is stable. Pulmonary hyperinflation and coarsening of  interstitial lung markings is stable. No evidence of acute pulmonary infiltrate or edema. No evidence of pneumothorax or pleural effusion. Right jugular central venous dialysis catheter remains in appropriate position. IMPRESSION: Stable mild cardiomegaly.  Suspect COPD.  No active lung disease. Electronically Signed   By: Earle Gell M.D.   On: 12/24/2015 11:49   Dg Wrist Complete Left  12/24/2015  CLINICAL DATA:  Became dizzy and fell landing on her elbow, was on floor until 0900 this morning when she was found by her daughter, history dialysis EXAM: LEFT WRIST - COMPLETE 3+ VIEW COMPARISON:  None FINDINGS: Diffuse soft tissue swelling. Surgical clips at the radial aspect of distal forearm question related to dialysis  access procedure. Extensive small vessel vascular calcifications. Degenerative changes first CMC joint. No definite acute fracture, dislocation or bone destruction. IMPRESSION: Osseous demineralization with degenerative changes at first [facility] joint. No definite acute bony abnormalities. Electronically Signed   By: Lavonia Dana M.D.   On: 12/24/2015 11:45   Ct Head Wo Contrast  12/24/2015  CLINICAL DATA:  Pt states she became dizzy and fell, was on floor until 9 am. Hit side of head, also c/o neck stiffness Pt on blood thinners Hx of vertigo, dialysis, CVA, CHF, HTN, DM EXAM: CT HEAD WITHOUT CONTRAST CT CERVICAL SPINE WITHOUT CONTRAST TECHNIQUE: Multidetector CT imaging of the head and cervical spine was performed following the standard protocol without intravenous contrast. Multiplanar CT image reconstructions of the cervical spine were also generated. COMPARISON:  CT head 01/16/2010. FINDINGS: CT HEAD FINDINGS No evidence for acute infarction, hemorrhage, mass lesion, hydrocephalus, or extra-axial fluid. Generalized atrophy. Remote LEFT parietal infarct. Generalized hypoattenuation of white matter representing small vessel disease. No skull fracture is evident. There is moderate vascular  calcification. No sinus or mastoid air fluid level. BILATERAL cataract extraction. CT CERVICAL SPINE FINDINGS There is no visible cervical spine fracture, traumatic subluxation, prevertebral soft tissue swelling, or intraspinal hematoma. Advanced disc space narrowing of a chronic nature at C3-4, C5-6, and C6-7. No neck masses. No lung apex lesion. Atherosclerosis. Dual lumen dialysis catheter from a RIGHT IJ approach. No worrisome osseous lesions. IMPRESSION: Atrophy and small vessel disease.  Remote LEFT parietal infarct. No skull fracture or intracranial hemorrhage. Advanced cervical spondylosis.  Extensive atherosclerosis. No cervical spine fracture or traumatic subluxation. Electronically Signed   By: Staci Righter M.D.   On: 12/24/2015 11:17   Ct Cervical Spine Wo Contrast  12/24/2015  CLINICAL DATA:  Pt states she became dizzy and fell, was on floor until 9 am. Hit side of head, also c/o neck stiffness Pt on blood thinners Hx of vertigo, dialysis, CVA, CHF, HTN, DM EXAM: CT HEAD WITHOUT CONTRAST CT CERVICAL SPINE WITHOUT CONTRAST TECHNIQUE: Multidetector CT imaging of the head and cervical spine was performed following the standard protocol without intravenous contrast. Multiplanar CT image reconstructions of the cervical spine were also generated. COMPARISON:  CT head 01/16/2010. FINDINGS: CT HEAD FINDINGS No evidence for acute infarction, hemorrhage, mass lesion, hydrocephalus, or extra-axial fluid. Generalized atrophy. Remote LEFT parietal infarct. Generalized hypoattenuation of white matter representing small vessel disease. No skull fracture is evident. There is moderate vascular calcification. No sinus or mastoid air fluid level. BILATERAL cataract extraction. CT CERVICAL SPINE FINDINGS There is no visible cervical spine fracture, traumatic subluxation, prevertebral soft tissue swelling, or intraspinal hematoma. Advanced disc space narrowing of a chronic nature at C3-4, C5-6, and C6-7. No neck masses.  No lung apex lesion. Atherosclerosis. Dual lumen dialysis catheter from a RIGHT IJ approach. No worrisome osseous lesions. IMPRESSION: Atrophy and small vessel disease.  Remote LEFT parietal infarct. No skull fracture or intracranial hemorrhage. Advanced cervical spondylosis.  Extensive atherosclerosis. No cervical spine fracture or traumatic subluxation. Electronically Signed   By: Staci Righter M.D.   On: 12/24/2015 11:17   Mri Brain Without Contrast  12/24/2015  ADDENDUM REPORT: 12/24/2015 22:07 ADDENDUM: Possibly stenotic distal RIGHT M1 segment and, possible slow flow or occlusion LEFT vertebral artery ; findings would be better characterized on MRA of the head. Electronically Signed   By: Elon Alas M.D.   On: 12/24/2015 22:07  12/24/2015  CLINICAL DATA:  Multiple syncopal episodes. New RIGHT hand weakness. History of diabetes,  hypertension, hyperlipidemia, stroke, chronic renal disease. EXAM: MRI HEAD WITHOUT CONTRAST TECHNIQUE: Multiplanar, multiecho pulse sequences of the brain and surrounding structures were obtained without intravenous contrast. COMPARISON:  CT head December 24, 2015 at 1044 hours FINDINGS: Subcentimeter focus of reduced diffusion RIGHT corona radiata/basal ganglia with low ADC value. Linear subcentimeter focus of reduced diffusion LEFT occipital lobe, favoring T2 shine through. Small nonspecific micro hemorrhage LEFT parietal occipital lobe associated with encephalomalacia. Small focus of encephalomalacia LEFT frontal lobe. Moderate ventriculomegaly on the basis of global parenchymal brain volume loss. Patchy to confluent supratentorial and pontine white matter FLAIR T2 hyperintensities. Old bilateral basal ganglia and bilateral thalamus lacunar infarcts. Old small RIGHT cerebellar infarcts. No midline shift, mass effect or mass lesions. No abnormal extra-axial fluid collections. No discernible LEFT vertebral artery flow void. Suspected stenosis distal RIGHT M1 segment, though  partial volume Ing female result in this appearance. Status post bilateral ocular lens implants. Visualized paranasal sinuses and mastoid air cells are well aerated. No abnormal sellar expansion. No cerebellar tonsillar ectopia. No suspicious calvarial bone marrow signal. IMPRESSION: Subcentimeter acute ischemia RIGHT corona radiata/ basal ganglia. LEFT parietal occipital encephalomalacia suggests watershed infarct or old trauma. Small area LEFT frontal lobe encephalomalacia. Moderate chronic small vessel ischemic disease. Old bilateral basal ganglia and thalamus lacunar infarcts. Old small RIGHT cerebellar infarcts. Nonvisualized LEFT vertebral artery flow void, this could represent RIGHT dominance or, slow flow/occlusion. Electronically Signed: By: Elon Alas M.D. On: 12/24/2015 20:58   Ct Hip Left Wo Contrast  12/25/2015  CLINICAL DATA:  Severe left hip pain since a fall yesterday. EXAM: CT OF THE LEFT HIP WITHOUT CONTRAST TECHNIQUE: Multidetector CT imaging of the left hip was performed according to the standard protocol. Multiplanar CT image reconstructions were also generated. COMPARISON:  Radiographs dated 12/24/2015 FINDINGS: There is no fracture or dislocation or bone destruction. Slight arthritic changes of the left sacroiliac joint. Extensive iliac atherosclerosis. No soft tissue hematomas. IMPRESSION: No acute abnormality of the left hip. Electronically Signed   By: Lorriane Shire M.D.   On: 12/25/2015 11:36   Nm Pulmonary Perf And Vent  12/26/2015  CLINICAL DATA:  Pulmonary hypertension, deep vein thrombosis, question chronic pulmonary embolism, history CHF, hyperlipidemia, coronary artery disease post MI and coronary PTCA, type II diabetes mellitus, CVA, former smoker EXAM: NUCLEAR MEDICINE VENTILATION - PERFUSION LUNG SCAN TECHNIQUE: Ventilation images were obtained in multiple projections using inhaled aerosol Tc-45mDTPA. Perfusion images were obtained in multiple projections after  intravenous injection of Tc-923mAA. RADIOPHARMACEUTICALS:  3116.1illicuries TeWRUEAVWUJW-11BTPA aerosol inhalation and 4.2 millicuries TeJYNWGNFAOZ-30QAA IV COMPARISON:  None; correlation chest radiograph 12/24/2015 FINDINGS: Ventilation: Normal ventilation lung scan Perfusion: Normal perfusion lung scan IMPRESSION: Normal ventilation and perfusion lung scans. Electronically Signed   By: MaLavonia Dana.D.   On: 12/26/2015 16:50   Dg Hip Unilat With Pelvis 2-3 Views Left  12/24/2015  CLINICAL DATA:  Vertigo.  Fall. EXAM: DG HIP (WITH OR WITHOUT PELVIS) 2-3V LEFT COMPARISON:  None. FINDINGS: Osteopenia. No acute fracture. No dislocation. Prominent vascular calcifications. Mild degenerative change in the hip joints. IMPRESSION: No acute bony pathology. Electronically Signed   By: ArMarybelle Killings.D.   On: 12/24/2015 11:47     Results/Tests Pending at Time of Discharge: none  Discharge Medications:    Medication List    STOP taking these medications        glucose blood test strip  Commonly known as:  ACCU-CHEK AVIVA     insulin glargine  100 UNIT/ML injection  Commonly known as:  LANTUS     metoprolol succinate 25 MG 24 hr tablet  Commonly known as:  TOPROL-XL      TAKE these medications        ACCU-CHEK AVIVA device  Use as instructed     ACCU-CHEK SOFTCLIX LANCET DEV Kit  1 strip by Does not apply route 3 (three) times daily.     accu-chek softclix lancets  Use as instructed     acetaminophen 500 MG tablet  Commonly known as:  TYLENOL  Take 1,000 mg by mouth every 6 (six) hours as needed for moderate pain.     albuterol 108 (90 Base) MCG/ACT inhaler  Commonly known as:  PROVENTIL HFA;VENTOLIN HFA  Inhale into the lungs every 4 (four) hours as needed for wheezing.     allopurinol 300 MG tablet  Commonly known as:  ZYLOPRIM  Take 300 mg by mouth daily.     aspirin 81 MG chewable tablet  Chew 1 tablet (81 mg total) by mouth daily.     calcium acetate 667 MG capsule   Commonly known as:  PHOSLO  Take 1,334 mg by mouth 3 (three) times daily with meals.     CINNAMON PO  Take 1,000 mg by mouth daily.     ferrous sulfate 325 (65 FE) MG tablet  Take 325 mg by mouth daily.     fexofenadine 180 MG tablet  Commonly known as:  ALLEGRA  Take 180 mg by mouth daily as needed for allergies or rhinitis.     Fish Oil 1200 MG Caps  Take 2 capsules by mouth 2 (two) times daily.     furosemide 80 MG tablet  Commonly known as:  LASIX  Take 80-240 mg by mouth daily. Takes 3 tabs on Sun, Mon, Wed and Fri  Takes 1 tab all other days     midodrine 10 MG tablet  Commonly known as:  PROAMATINE  Take 1 tablet (10 mg total) by mouth 2 (two) times daily with a meal.     mometasone-formoterol 100-5 MCG/ACT Aero  Commonly known as:  DULERA  Inhale 2 puffs into the lungs 2 (two) times daily as needed for shortness of breath.     nitroGLYCERIN 0.4 MG SL tablet  Commonly known as:  NITROSTAT  Place 0.4 mg under the tongue every 5 (five) minutes as needed. For chest pain     Call 911 if pain not relieved by 3rd tab     omeprazole 20 MG capsule  Commonly known as:  PRILOSEC  Take 1 capsule (20 mg total) by mouth at bedtime.     oxyCODONE-acetaminophen 5-325 MG tablet  Commonly known as:  ROXICET  Take 1-2 tablets by mouth every 4 (four) hours as needed for severe pain.     pravastatin 40 MG tablet  Commonly known as:  PRAVACHOL  TAKE 1 TABLET BY MOUTH EVERY DAY     SYSTANE OP  Apply 1-2 drops to eye 4 (four) times daily as needed. For dry eyes     Tiotropium Bromide Monohydrate 2.5 MCG/ACT Aers  Inhale 2 puffs into the lungs daily.     vitamin B-12 1000 MCG tablet  Commonly known as:  CYANOCOBALAMIN  Take 1,000 mcg by mouth every morning.     warfarin 5 MG tablet  Commonly known as:  COUMADIN  Take 1 tablet (5 mg total) by mouth one time only at 6 PM.  Discharge Instructions: Please refer to Patient Instructions section of EMR for full details.   Patient was counseled important signs and symptoms that should prompt return to medical care, changes in medications, dietary instructions, activity restrictions, and follow up appointments.   Follow-Up Appointments: Follow-up Information    Follow up with SETHI,PRAMOD, MD.   Specialties:  Neurology, Radiology   Why:  Stroke Clinic, Office will call you with appointment date & time   Contact information:   [place] [address] [phone]       Follow up with [name], PA-C On 02/06/2016.   Specialties:  Cardiology, Radiology   Why:  11:30AM. Cardiology followup   Contact information:   [address] [place] [id number] [phone]       Follow up with [facility].   Specialty:  Cardiology   Why:  Office will contact you to arrange 30 day event monitor once cleared with insurance, please give [place] a call if do not hear from [place] in 7 days   Contact information:   [address] [place] [phone]      Follow up with [facility].   Specialty:  Cardiology   Why:  You will need a sleep study to rule out obstructive sleep apnea, office will contact you to arrange this.    Contact information:   [address] [place], MD 12/26/2015, 8:21 PM PGY-1, [place]

## 2015-12-27 ENCOUNTER — Inpatient Hospital Stay (HOSPITAL_COMMUNITY): Payer: Commercial Managed Care - HMO

## 2015-12-27 ENCOUNTER — Other Ambulatory Visit: Payer: Self-pay | Admitting: Physician Assistant

## 2015-12-27 DIAGNOSIS — I4891 Unspecified atrial fibrillation: Secondary | ICD-10-CM

## 2015-12-27 DIAGNOSIS — I6340 Cerebral infarction due to embolism of unspecified cerebral artery: Principal | ICD-10-CM

## 2015-12-27 DIAGNOSIS — R001 Bradycardia, unspecified: Secondary | ICD-10-CM

## 2015-12-27 DIAGNOSIS — I6320 Cerebral infarction due to unspecified occlusion or stenosis of unspecified precerebral arteries: Secondary | ICD-10-CM

## 2015-12-27 DIAGNOSIS — Z5189 Encounter for other specified aftercare: Secondary | ICD-10-CM

## 2015-12-27 DIAGNOSIS — I2789 Other specified pulmonary heart diseases: Secondary | ICD-10-CM

## 2015-12-27 DIAGNOSIS — I63413 Cerebral infarction due to embolism of bilateral middle cerebral arteries: Secondary | ICD-10-CM | POA: Insufficient documentation

## 2015-12-27 LAB — PULMONARY FUNCTION TEST
DL/VA % pred: 57 %
DL/VA: 2.45 ml/min/mmHg/L
DLCO UNC: 7.65 ml/min/mmHg
DLCO cor % pred: 43 %
DLCO cor: 8.2 ml/min/mmHg
DLCO unc % pred: 40 %
FEF 25-75 Pre: 1.4 L/sec
FEF2575-%Pred-Pre: 99 %
FEV1-%PRED-PRE: 87 %
FEV1-PRE: 1.5 L
FEV1FVC-%Pred-Pre: 105 %
FEV6-%PRED-PRE: 87 %
FEV6-PRE: 1.9 L
FEV6FVC-%Pred-Pre: 106 %
FVC-%Pred-Pre: 82 %
FVC-Pre: 1.9 L
PRE FEV1/FVC RATIO: 79 %
Pre FEV6/FVC Ratio: 100 %
RV % PRED: 107 %
RV: 2.26 L
TLC % PRED: 91 %
TLC: 4.09 L

## 2015-12-27 LAB — BLOOD GAS, ARTERIAL
ACID-BASE EXCESS: 0.9 mmol/L (ref 0.0–2.0)
Bicarbonate: 23.7 mEq/L (ref 20.0–24.0)
Drawn by: 205171
FIO2: 0.21
O2 Saturation: 96.8 %
PATIENT TEMPERATURE: 98.6
PCO2 ART: 29.9 mmHg — AB (ref 35.0–45.0)
TCO2: 24.6 mmol/L (ref 0–100)
pH, Arterial: 7.51 — ABNORMAL HIGH (ref 7.350–7.450)
pO2, Arterial: 80.4 mmHg (ref 80.0–100.0)

## 2015-12-27 LAB — RENAL FUNCTION PANEL
Albumin: 2.1 g/dL — ABNORMAL LOW (ref 3.5–5.0)
Anion gap: 12 (ref 5–15)
BUN: 23 mg/dL — ABNORMAL HIGH (ref 6–20)
CO2: 28 mmol/L (ref 22–32)
Calcium: 10.3 mg/dL (ref 8.9–10.3)
Chloride: 93 mmol/L — ABNORMAL LOW (ref 101–111)
Creatinine, Ser: 6.38 mg/dL — ABNORMAL HIGH (ref 0.44–1.00)
GFR calc Af Amer: 7 mL/min — ABNORMAL LOW (ref >60)
GFR calc non Af Amer: 6 mL/min — ABNORMAL LOW (ref >60)
Glucose, Bld: 138 mg/dL — ABNORMAL HIGH (ref 65–99)
Phosphorus: 5.1 mg/dL — ABNORMAL HIGH (ref 2.5–4.6)
Potassium: 4.3 mmol/L (ref 3.5–5.1)
Sodium: 133 mmol/L — ABNORMAL LOW (ref 135–145)

## 2015-12-27 LAB — CBC
HCT: 33.8 % — ABNORMAL LOW (ref 36.0–46.0)
Hemoglobin: 11.2 g/dL — ABNORMAL LOW (ref 12.0–15.0)
MCH: 34.7 pg — ABNORMAL HIGH (ref 26.0–34.0)
MCHC: 33.1 g/dL (ref 30.0–36.0)
MCV: 104.6 fL — ABNORMAL HIGH (ref 78.0–100.0)
Platelets: 159 10*3/uL (ref 150–400)
RBC: 3.23 MIL/uL — ABNORMAL LOW (ref 3.87–5.11)
RDW: 15 % (ref 11.5–15.5)
WBC: 8.9 10*3/uL (ref 4.0–10.5)

## 2015-12-27 LAB — BASIC METABOLIC PANEL
ANION GAP: 11 (ref 5–15)
BUN: 16 mg/dL (ref 6–20)
CALCIUM: 9.6 mg/dL (ref 8.9–10.3)
CHLORIDE: 95 mmol/L — AB (ref 101–111)
CO2: 27 mmol/L (ref 22–32)
Creatinine, Ser: 5.6 mg/dL — ABNORMAL HIGH (ref 0.44–1.00)
GFR calc non Af Amer: 7 mL/min — ABNORMAL LOW (ref >60)
GFR, EST AFRICAN AMERICAN: 8 mL/min — AB (ref >60)
Glucose, Bld: 88 mg/dL (ref 65–99)
POTASSIUM: 4 mmol/L (ref 3.5–5.1)
Sodium: 133 mmol/L — ABNORMAL LOW (ref 135–145)

## 2015-12-27 LAB — GLUCOSE, CAPILLARY
GLUCOSE-CAPILLARY: 135 mg/dL — AB (ref 65–99)
Glucose-Capillary: 99 mg/dL (ref 65–99)

## 2015-12-27 LAB — HEPATITIS B SURFACE ANTIGEN: Hepatitis B Surface Ag: NEGATIVE

## 2015-12-27 LAB — PROTIME-INR
INR: 2.25 — ABNORMAL HIGH (ref 0.00–1.49)
Prothrombin Time: 24.6 seconds — ABNORMAL HIGH (ref 11.6–15.2)

## 2015-12-27 MED ORDER — WARFARIN SODIUM 2.5 MG PO TABS
2.5000 mg | ORAL_TABLET | Freq: Once | ORAL | Status: AC
  Administered 2015-12-27: 2.5 mg via ORAL
  Filled 2015-12-27: qty 1

## 2015-12-27 MED ORDER — SODIUM CHLORIDE 0.9 % IV SOLN
100.0000 mL | INTRAVENOUS | Status: DC | PRN
Start: 2015-12-27 — End: 2015-12-28

## 2015-12-27 MED ORDER — WARFARIN - PHARMACIST DOSING INPATIENT
Freq: Every day | Status: DC
  Administered 2015-12-27: 18:00:00

## 2015-12-27 MED ORDER — LIDOCAINE HCL (PF) 1 % IJ SOLN
5.0000 mL | INTRAMUSCULAR | Status: DC | PRN
Start: 2015-12-27 — End: 2015-12-28

## 2015-12-27 MED ORDER — SODIUM CHLORIDE 0.9 % IV SOLN: 100.0000 mL | INTRAVENOUS | Status: DC | PRN

## 2015-12-27 MED ORDER — HEPARIN SODIUM (PORCINE) 1000 UNIT/ML DIALYSIS
20.0000 [IU]/kg | INTRAMUSCULAR | Status: DC | PRN
  Filled 2015-12-27: qty 2

## 2015-12-27 MED ORDER — PRAVASTATIN SODIUM 40 MG PO TABS
40.0000 mg | ORAL_TABLET | Freq: Every day | ORAL | Status: DC
  Administered 2015-12-27 – 2015-12-28 (×2): 40 mg via ORAL
  Filled 2015-12-27: qty 1

## 2015-12-27 MED ORDER — HEPARIN SODIUM (PORCINE) 1000 UNIT/ML DIALYSIS
1000.0000 [IU] | INTRAMUSCULAR | Status: DC | PRN
  Filled 2015-12-27: qty 1

## 2015-12-27 MED ORDER — ASPIRIN 81 MG PO CHEW
81.0000 mg | CHEWABLE_TABLET | Freq: Every day | ORAL | Status: DC
  Administered 2015-12-27 – 2015-12-28 (×2): 81 mg via ORAL
  Filled 2015-12-27 (×2): qty 1

## 2015-12-27 MED ORDER — PENTAFLUOROPROP-TETRAFLUOROETH EX AERO: 1.0000 "application " | INHALATION_SPRAY | CUTANEOUS | Status: DC | PRN

## 2015-12-27 MED ORDER — ALTEPLASE 2 MG IJ SOLR
2.0000 mg | Freq: Once | INTRAMUSCULAR | Status: DC | PRN
  Filled 2015-12-27: qty 2

## 2015-12-27 MED ORDER — LIDOCAINE-PRILOCAINE 2.5-2.5 % EX CREA
1.0000 | TOPICAL_CREAM | CUTANEOUS | Status: DC | PRN
Start: 2015-12-27 — End: 2015-12-28
  Filled 2015-12-27: qty 5

## 2015-12-27 NOTE — Progress Notes (Signed)
Rehab admissions - I met with patient.  She has decided that she would like to pursue SNF outside the hospital rather than pursue inpatient rehab admission.  Case manager is aware.  Call me for questions.  #[phone]

## 2015-12-27 NOTE — Care Management Important Message (Signed)
Important Message  Patient Details  Name: Kristi [name] MRN: [id number] Date of Birth: Nov 28, 1941   Medicare Important Message Given:  Yes    S[place] P [place] 12/27/2015, 2:25 PM

## 2015-12-27 NOTE — Progress Notes (Signed)
Occupational Therapy Treatment Patient Details Name: Kristi [name] MRN: [id number] DOB: 08-21-1941 Today's Date: 12/27/2015    History of present illness 76 y.o. female with hx of ESRD on HD, Chronic back painand right shoulder pain, osteoporosis, COPD, arthritis, CAD, CHF, A fib, DM, HLD admitted with multiple syncopal episodes over the past month. On admission she was found to have LUE weakness so a MRI brain was ordered. Patient reports her left arm has been weak for around 1-2 weeks. She has a history of A fib for which she is on coumadin. INR on admission was 5.64. MRI brain imaging reviewed, shows small right basal ganglia infarct, question of right M1 stenosis and occlusion/slow flow of left vertebral artery, LEFT parietal occipital encephalomalacia suggests watershed infarct or old trauma. Small area LEFT frontal lobe encephalomalacia. 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   OT comments  Pt limited by c/o mouth/tooth pain secondary to broken tooth in her mouth, MD is aware and addressing. Pt was educated in home program for bilateral UE AROM ex's and reviewed energy conservation techniques. She demonstrates continued deconditioned state and decreased activity tolerance.  Follow Up Recommendations    CIR vs SNF Rehab   Equipment Recommendations       Recommendations for Other Services      Precautions / Restrictions Precautions Precautions: Fall Restrictions Weight Bearing Restrictions: No       Mobility Bed Mobility Overal bed mobility:  (Pt sitting up at EOB upon OT arrival)                Transfers                      Balance                                   ADL Overall ADL's : Needs assistance/impaired Eating/Feeding: Modified independent Eating/Feeding Details (indicate cue type and reason): Pt with pain on tongue where broken tooth is rubbing. Her MD is aware and discussed with her today. Grooming: Wash/dry hands;Wash/dry face;Set  up;Sitting                                 General ADL Comments: Encouraged pt to be using LUE. Verbally reviewed energy conservation techniques and issued HEP (initially was with yellow theraband/level 1 but pt unable, therefore AROM exercies only performed).      Vision  Wears glasses PRN, no change from baseline.                   Perception     Praxis      Cognition   Behavior During Therapy: Agitated Overall Cognitive Status: Impaired/Different from baseline Area of Impairment: Memory;Problem solving     Memory: Decreased short-term memory        Problem Solving: Slow processing;Requires verbal cues General Comments: agitated due to diet restrictions and unable to eat much due to broken tooth.  Have asked RN for paraffin if available to coat tooth so doesn't rip flesh on cheek.  Assisted with calling in dinner and breakfast order for pt. MD in room and aware of this.    Extremity/Trunk Assessment               Exercises General Exercises - Upper Extremity Shoulder Flexion: AROM;Both;5 reps;Seated (Attempted w/ yellow theraband pt unable) Shoulder Extension: AROM;Both;5  reps;Seated (Attempted with theraband, pt unable) Shoulder Horizontal ABduction: AROM;Both;5 reps;Seated (Attempted with theraband, pt unable)   Shoulder Instructions       General Comments      Pertinent Vitals/ Pain       Pain Assessment: Faces Faces Pain Scale: Hurts even more Pain Location: Left incisor tooth broken and rubbing on cheek/mouth making eating difficult Pain Descriptors / Indicators: Constant;Discomfort;Grimacing;Sore;Tender Pain Intervention(s): Limited activity within patient's tolerance;Monitored during session;Repositioned;Other (comment) (MD in room and aware of c/o mouth/tooth pain)  Home Living                                          Prior Functioning/Environment              Frequency Min 2X/week     Progress Toward  Goals  OT Goals(current goals can now be found in the care plan section)  Progress towards OT goals: Progressing toward goals     Plan Discharge plan remains appropriate    Co-evaluation                 End of Session     Activity Tolerance Patient limited by pain (From tooth/mouth MD is aware.)   Patient Left in bed;with call bell/phone within reach;with bed alarm set   Nurse Communication          Time: [phone] OT Time Calculation (min): 21 min  Charges: OT General Charges $OT Visit: 1 Procedure OT Treatments $Therapeutic Exercise: 8-22 mins  Julie Paolini Beth Dixon, OTR/L 12/27/2015, 9:43 AM

## 2015-12-27 NOTE — Progress Notes (Signed)
[facility] Progress Note  Assessment/Plan: 1. Syncopal episodes - cards following - likely multifactorial -  BB/diuretic stopped;  2. ESRD -TTS - next HD Thursday  K 4 - start on 3 K bath Thursday - use AVF Qb 250- 17 gauge-  3. Anemia - Hgb 11.1 stable 4. Secondary hyperparathyroidism - vit D on hold , on 1 phoslo ac, no sensipar/ need to check P 5. BP/volume - midodrine for BP support, leaving below edw; would continue to hold diuretic after d/[name] -BP too sfot to resume BB per cards NET UF 1/17 1 L with post weight 73 - needs orthostatic pre and post HD - new edw for d/[name] 6. Nutrition - renal/vit 7. Hx Afib with elevated INR on admission at goal now - avoid K drop on HD; in NSR off BB 8. Disp - pt declines inpt rehab - interested in SNF- has been at [facility] before; no family in town - lives alone 9. Severe pulmonary HTN- recommendations per cards note today- plan set up outpt sleep study and 30 day heart monitor 10. MSK pain Should we try stopping statins??   Kristi Jacobson, PA-[name] 12/27/2015,11:34 AM  LOS: 3 days   Renal Attending: Cardiac wu in progress.  30 day heart monitor planned.  For HD in AM.  Agree with note above. Kristi [name]   Subjective:   Doesn't know which access was used for dialysis; doesn't remember if she had standing wts yesterday; cannot remember the name of the town where one of her daughter's lives.   Objective Filed Vitals:   12/26/15 1940 12/26/15 2036 12/27/15 0602 12/27/15 0941  BP:  104/48 120/46   Pulse:  70 69   Temp:  98.2 F (36.8 [name]) 98 F (36.7 [name])   TempSrc:  Oral Oral   Resp:  18 18   Height:      Weight:   73.5 kg (162 lb 0.6 oz)   SpO2: 98% 95% 97% 96%   Physical Exam General: supine in bed ill appearing Heart: RRR  Lungs: no rales Abdomen: soft NT Extremities no sig edema, sig pain with palpation of LE Dialysis Access:  :right IJ and left upper AVF + bruit  Dialysis  Orders: [facility] on TTS . EDW 75 kg just lowered 12/21/15 (still leaving below edw =73.8 1/14 and and 74.5 1/12 HD Bath 2.0 k, 2.25 ca Time 3 hr 36min Heparin 2500. Access rij perm cath and LUA AVF(inserted BFR 350 DFR 800  hectorol 4 mcg IV/HD 0 esa Units IV/HD Venofer 0  Other op lab hgb 111.5 Ca 10.4 and phos 3.1 pth 203 10/06/16 tfs 34%   Additional Objective Labs: Lab Results  Component Value Date   INR 2.25* 12/27/2015   INR 3.52* 12/26/2015   INR 5.59* [id number]    Basic Metabolic Panel:  Recent Labs Lab 12/25/15 0344 12/26/15 0537 12/27/15 0447  NA 137 134* 133*  K 3.7 4.3 4.0  CL 98* 95* 95*  CO2 27 27 27   GLUCOSE 122* 96 88  BUN 18 29* 16  CREATININE 6.79* 8.93* 5.60*  CALCIUM 10.2 10.7* 9.6   Liver Function Tests:  Recent Labs Lab 12/24/15 1300  AST 29  ALT 7*  ALKPHOS 104  BILITOT 0.9  PROT 5.7*  ALBUMIN 2.4*  CBC:  Recent Labs Lab 12/24/15 1135 12/24/15 1307 12/25/15 0344 12/26/15 0537  WBC 9.3  --  6.9 8.5  NEUTROABS 6.9  --   --   --  HGB 12.4 13.3 11.0* 11.1*  HCT 37.8 39.0 34.2* 34.6*  MCV 104.4*  --  105.6* 105.8*  PLT 189  --  193 170  Cardiac Enzymes:  Recent Labs Lab 12/24/15 1300  CKTOTAL 175   CBG:  Recent Labs Lab 12/25/15 1634 12/26/15 0632 12/26/15 1321 12/26/15 1658 12/27/15 0704  GLUCAP 200* 95 153* 145* 99  Studies/Results: Nm Pulmonary Perf And Vent  12/26/2015  CLINICAL DATA:  Pulmonary hypertension, deep vein thrombosis, question chronic pulmonary embolism, history CHF, hyperlipidemia, coronary artery disease post MI and coronary PTCA, type II diabetes mellitus, CVA, former smoker EXAM: NUCLEAR MEDICINE VENTILATION - PERFUSION LUNG SCAN TECHNIQUE: Ventilation images were obtained in multiple projections using inhaled aerosol Tc-59m DTPA. Perfusion images were obtained in multiple projections after intravenous injection of Tc-76m MAA. RADIOPHARMACEUTICALS:  0000000 millicuries [id number]  DTPA aerosol inhalation and 4.2 millicuries [id number] MAA IV COMPARISON:  None; correlation chest radiograph 12/24/2015 FINDINGS: Ventilation: Normal ventilation lung scan Perfusion: Normal perfusion lung scan IMPRESSION: Normal ventilation and perfusion lung scans. Electronically Signed   By: Lavonia Dana M.D.   On: 12/26/2015 16:50   Medications:   . allopurinol  300 mg Oral Daily  . aspirin  81 mg Oral Daily  . atorvastatin  40 mg Oral q1800  . calcium acetate  667 mg Oral TID WC  . midodrine  10 mg Oral BID WC  . mometasone-formoterol  2 puff Inhalation BID  . sodium chloride  3 mL Intravenous Q12H

## 2015-12-27 NOTE — Progress Notes (Signed)
SUBJECTIVE:  No complaints  OBJECTIVE:   Vitals:   Filed Vitals:   12/26/15 1940 12/26/15 2036 12/27/15 0602 12/27/15 0941  BP:  104/48 120/46   Pulse:  70 69   Temp:  98.2 F (36.8 C) 98 F (36.7 C)   TempSrc:  Oral Oral   Resp:  18 18   Height:      Weight:   162 lb 0.6 oz (73.5 kg)   SpO2: 98% 95% 97% 96%   I&O's:   Intake/Output Summary (Last 24 hours) at 12/27/15 1024 Last data filed at 12/27/15 0616  Gross per 24 hour  Intake    240 ml  Output   1100 ml  Net   -860 ml   TELEMETRY: Reviewed telemetry pt in NSR:     PHYSICAL EXAM General: Well developed, well nourished, in no acute distress Head: Eyes PERRLA, No xanthomas.   Normal cephalic and atramatic  Lungs:   Clear bilaterally to auscultation and percussion. Heart:   HRRR S1 S2 Pulses are 2+ & equal. Abdomen: Bowel sounds are positive, abdomen soft and non-tender without masses  Extremities:   No clubbing, cyanosis or edema.  DP +1 Neuro: Alert and oriented X 3. Psych:  Good affect, responds appropriately   LABS: Basic Metabolic Panel:  Recent Labs  12/26/15 0537 12/27/15 0447  NA 134* 133*  K 4.3 4.0  CL 95* 95*  CO2 27 27  GLUCOSE 96 88  BUN 29* 16  CREATININE 8.93* 5.60*  CALCIUM 10.7* 9.6   Liver Function Tests:  Recent Labs  12/24/15 1300  AST 29  ALT 7*  ALKPHOS 104  BILITOT 0.9  PROT 5.7*  ALBUMIN 2.4*   No results for input(s): LIPASE, AMYLASE in the last 72 hours. CBC:  Recent Labs  12/24/15 1135  12/25/15 0344 12/26/15 0537  WBC 9.3  --  6.9 8.5  NEUTROABS 6.9  --   --   --   HGB 12.4  < > 11.0* 11.1*  HCT 37.8  < > 34.2* 34.6*  MCV 104.4*  --  105.6* 105.8*  PLT 189  --  193 170  < > = values in this interval not displayed. Cardiac Enzymes:  Recent Labs  12/24/15 1300  CKTOTAL 175   BNP: Invalid input(s): POCBNP D-Dimer: No results for input(s): DDIMER in the last 72 hours. Hemoglobin A1C: No results for input(s): HGBA1C in the last 72  hours. Fasting Lipid Panel: No results for input(s): CHOL, HDL, LDLCALC, TRIG, CHOLHDL, LDLDIRECT in the last 72 hours. Thyroid Function Tests:  Recent Labs  12/24/15 1730  TSH 0.918   Anemia Panel: No results for input(s): VITAMINB12, FOLATE, FERRITIN, TIBC, IRON, RETICCTPCT in the last 72 hours. Coag Panel:   Lab Results  Component Value Date   INR 2.25* 12/27/2015   INR 3.52* 12/26/2015   INR 5.59* 12/25/2015    RADIOLOGY: Dg Chest 2 View  12/24/2015  CLINICAL DATA:  Vertigo.  Fall.  Chronic renal failure on dialysis. EXAM: CHEST  2 VIEW COMPARISON:  01/19/2015 FINDINGS: Mild cardiomegaly is stable. Pulmonary hyperinflation and coarsening of interstitial lung markings is stable. No evidence of acute pulmonary infiltrate or edema. No evidence of pneumothorax or pleural effusion. Right jugular central venous dialysis catheter remains in appropriate position. IMPRESSION: Stable mild cardiomegaly.  Suspect COPD.  No active lung disease. Electronically Signed   By: Earle Gell M.D.   On: 12/24/2015 11:49   Dg Wrist Complete Left  12/24/2015  CLINICAL  DATA:  Became dizzy and fell landing on her elbow, was on floor until 0900 this morning when she was found by her daughter, history dialysis EXAM: LEFT WRIST - COMPLETE 3+ VIEW COMPARISON:  None FINDINGS: Diffuse soft tissue swelling. Surgical clips at the radial aspect of distal forearm question related to dialysis access procedure. Extensive small vessel vascular calcifications. Degenerative changes first CMC joint. No definite acute fracture, dislocation or bone destruction. IMPRESSION: Osseous demineralization with degenerative changes at first [facility] joint. No definite acute bony abnormalities. Electronically Signed   By: Lavonia Dana M.D.   On: 12/24/2015 11:45   Dg Wrist Complete Left  12/22/2015  CLINICAL DATA:  Left wrist pain and swelling after fall in home 1 week ago. Initial encounter. EXAM: LEFT WRIST - COMPLETE 3+ VIEW COMPARISON:   None. FINDINGS: There is no evidence of fracture or dislocation. Osteoarthritis of the first carpometacarpal joint is noted. Vascular calcifications are noted. IMPRESSION: No acute abnormality seen in the left wrist. Osteoarthritis of first carpometacarpal joint is noted. Electronically Signed   By: Marijo Conception, M.D.   On: 12/22/2015 17:30   Dg Tibia/fibula Left  12/22/2015  CLINICAL DATA:  Pain after fall 1 week prior EXAM: LEFT TIBIA AND FIBULA - 2 VIEW COMPARISON:  None. FINDINGS: Frontal and lateral views were obtained. There is no demonstrable fracture or dislocation. The joint spaces appear intact. There is arterial vascular calcification. No abnormal periosteal reaction. IMPRESSION: No fracture or dislocation.  No appreciable arthropathy. Electronically Signed   By: Lowella Grip III M.D.   On: 12/22/2015 17:29   Ct Head Wo Contrast  12/24/2015  CLINICAL DATA:  Pt states she became dizzy and fell, was on floor until 9 am. Hit side of head, also c/o neck stiffness Pt on blood thinners Hx of vertigo, dialysis, CVA, CHF, HTN, DM EXAM: CT HEAD WITHOUT CONTRAST CT CERVICAL SPINE WITHOUT CONTRAST TECHNIQUE: Multidetector CT imaging of the head and cervical spine was performed following the standard protocol without intravenous contrast. Multiplanar CT image reconstructions of the cervical spine were also generated. COMPARISON:  CT head 01/16/2010. FINDINGS: CT HEAD FINDINGS No evidence for acute infarction, hemorrhage, mass lesion, hydrocephalus, or extra-axial fluid. Generalized atrophy. Remote LEFT parietal infarct. Generalized hypoattenuation of white matter representing small vessel disease. No skull fracture is evident. There is moderate vascular calcification. No sinus or mastoid air fluid level. BILATERAL cataract extraction. CT CERVICAL SPINE FINDINGS There is no visible cervical spine fracture, traumatic subluxation, prevertebral soft tissue swelling, or intraspinal hematoma. Advanced disc  space narrowing of a chronic nature at C3-4, C5-6, and C6-7. No neck masses. No lung apex lesion. Atherosclerosis. Dual lumen dialysis catheter from a RIGHT IJ approach. No worrisome osseous lesions. IMPRESSION: Atrophy and small vessel disease.  Remote LEFT parietal infarct. No skull fracture or intracranial hemorrhage. Advanced cervical spondylosis.  Extensive atherosclerosis. No cervical spine fracture or traumatic subluxation. Electronically Signed   By: Staci Righter M.D.   On: 12/24/2015 11:17   Ct Cervical Spine Wo Contrast  12/24/2015  CLINICAL DATA:  Pt states she became dizzy and fell, was on floor until 9 am. Hit side of head, also c/o neck stiffness Pt on blood thinners Hx of vertigo, dialysis, CVA, CHF, HTN, DM EXAM: CT HEAD WITHOUT CONTRAST CT CERVICAL SPINE WITHOUT CONTRAST TECHNIQUE: Multidetector CT imaging of the head and cervical spine was performed following the standard protocol without intravenous contrast. Multiplanar CT image reconstructions of the cervical spine were also generated. COMPARISON:  CT head 01/16/2010. FINDINGS: CT HEAD FINDINGS No evidence for acute infarction, hemorrhage, mass lesion, hydrocephalus, or extra-axial fluid. Generalized atrophy. Remote LEFT parietal infarct. Generalized hypoattenuation of white matter representing small vessel disease. No skull fracture is evident. There is moderate vascular calcification. No sinus or mastoid air fluid level. BILATERAL cataract extraction. CT CERVICAL SPINE FINDINGS There is no visible cervical spine fracture, traumatic subluxation, prevertebral soft tissue swelling, or intraspinal hematoma. Advanced disc space narrowing of a chronic nature at C3-4, C5-6, and C6-7. No neck masses. No lung apex lesion. Atherosclerosis. Dual lumen dialysis catheter from a RIGHT IJ approach. No worrisome osseous lesions. IMPRESSION: Atrophy and small vessel disease.  Remote LEFT parietal infarct. No skull fracture or intracranial hemorrhage.  Advanced cervical spondylosis.  Extensive atherosclerosis. No cervical spine fracture or traumatic subluxation. Electronically Signed   By: Staci Righter M.D.   On: 12/24/2015 11:17   Mri Brain Without Contrast  12/24/2015  ADDENDUM REPORT: 12/24/2015 22:07 ADDENDUM: Possibly stenotic distal RIGHT M1 segment and, possible slow flow or occlusion LEFT vertebral artery ; findings would be better characterized on MRA of the head. Electronically Signed   By: Elon Alas M.D.   On: 12/24/2015 22:07  12/24/2015  CLINICAL DATA:  Multiple syncopal episodes. New RIGHT hand weakness. History of diabetes, hypertension, hyperlipidemia, stroke, chronic renal disease. EXAM: MRI HEAD WITHOUT CONTRAST TECHNIQUE: Multiplanar, multiecho pulse sequences of the brain and surrounding structures were obtained without intravenous contrast. COMPARISON:  CT head December 24, 2015 at 1044 hours FINDINGS: Subcentimeter focus of reduced diffusion RIGHT corona radiata/basal ganglia with low ADC value. Linear subcentimeter focus of reduced diffusion LEFT occipital lobe, favoring T2 shine through. Small nonspecific micro hemorrhage LEFT parietal occipital lobe associated with encephalomalacia. Small focus of encephalomalacia LEFT frontal lobe. Moderate ventriculomegaly on the basis of global parenchymal brain volume loss. Patchy to confluent supratentorial and pontine white matter FLAIR T2 hyperintensities. Old bilateral basal ganglia and bilateral thalamus lacunar infarcts. Old small RIGHT cerebellar infarcts. No midline shift, mass effect or mass lesions. No abnormal extra-axial fluid collections. No discernible LEFT vertebral artery flow void. Suspected stenosis distal RIGHT M1 segment, though partial volume Ing female result in this appearance. Status post bilateral ocular lens implants. Visualized paranasal sinuses and mastoid air cells are well aerated. No abnormal sellar expansion. No cerebellar tonsillar ectopia. No suspicious  calvarial bone marrow signal. IMPRESSION: Subcentimeter acute ischemia RIGHT corona radiata/ basal ganglia. LEFT parietal occipital encephalomalacia suggests watershed infarct or old trauma. Small area LEFT frontal lobe encephalomalacia. Moderate chronic small vessel ischemic disease. Old bilateral basal ganglia and thalamus lacunar infarcts. Old small RIGHT cerebellar infarcts. Nonvisualized LEFT vertebral artery flow void, this could represent RIGHT dominance or, slow flow/occlusion. Electronically Signed: By: Elon Alas M.D. On: 12/24/2015 20:58   Ct Hip Left Wo Contrast  12/25/2015  CLINICAL DATA:  Severe left hip pain since a fall yesterday. EXAM: CT OF THE LEFT HIP WITHOUT CONTRAST TECHNIQUE: Multidetector CT imaging of the left hip was performed according to the standard protocol. Multiplanar CT image reconstructions were also generated. COMPARISON:  Radiographs dated 12/24/2015 FINDINGS: There is no fracture or dislocation or bone destruction. Slight arthritic changes of the left sacroiliac joint. Extensive iliac atherosclerosis. No soft tissue hematomas. IMPRESSION: No acute abnormality of the left hip. Electronically Signed   By: Lorriane Shire M.D.   On: 12/25/2015 11:36   Nm Pulmonary Perf And Vent  12/26/2015  CLINICAL DATA:  Pulmonary hypertension, deep vein thrombosis, question chronic pulmonary embolism,  history CHF, hyperlipidemia, coronary artery disease post MI and coronary PTCA, type II diabetes mellitus, CVA, former smoker EXAM: NUCLEAR MEDICINE VENTILATION - PERFUSION LUNG SCAN TECHNIQUE: Ventilation images were obtained in multiple projections using inhaled aerosol Tc-16m DTPA. Perfusion images were obtained in multiple projections after intravenous injection of Tc-20m MAA. RADIOPHARMACEUTICALS:  0000000 millicuries [id number] DTPA aerosol inhalation and 4.2 millicuries [id number] MAA IV COMPARISON:  None; correlation chest radiograph 12/24/2015 FINDINGS: Ventilation: Normal  ventilation lung scan Perfusion: Normal perfusion lung scan IMPRESSION: Normal ventilation and perfusion lung scans. Electronically Signed   By: Lavonia Dana M.D.   On: 12/26/2015 16:50   Dg Hip Unilat With Pelvis 2-3 Views Left  12/24/2015  CLINICAL DATA:  Vertigo.  Fall. EXAM: DG HIP (WITH OR WITHOUT PELVIS) 2-3V LEFT COMPARISON:  None. FINDINGS: Osteopenia. No acute fracture. No dislocation. Prominent vascular calcifications. Mild degenerative change in the hip joints. IMPRESSION: No acute bony pathology. Electronically Signed   By: Marybelle Killings M.D.   On: 12/24/2015 11:47    ASSESSMENT/PLAN: 1. ? Syncope- The patient adamantly denies any LOC with her falls. She has had some dizziness with change in position but no presyncope or syncope. This sounds more orthostatic in nature as it occurs mainly with changes in position from sitting to standing and also has had to hold her diuretic and BB over the past few weeks due to low BP during HD. She denies any palpitations. Unlikely to be primary arrhythmia but will get a 30 day heart monitor to assess for bradyarrhythmias or long pauses. She does have baseline ST changes on EKG but these are unchanged in the past year. She denies any anginal symptoms. I do not think this is from coronary ischemia. 2D echo showed normal LVF with severe pulmonary HTN. Carotid dopplers with mild 1-39% carotid artery stenosis. Agree with Midodrine for orthostatic hypotension.  2. Paroxysmal atrial fibrillation maintaining NSR - coumadin on hold and INR has decreased to 2.25  Today.  Resume coumadin per pharmacy.   3. 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 Continue statin/ASA. BP too soft to resume BB. 4. HTN - on soft side 5. Dyslipidemia - continue statin 6. LUE weakness with MRI showing several new acute infarcts ( MRI Acute R basal Ganglia Infarct and ? Left parietal Occipital infarct). Neuro w/u in progress. 7. ESRD on HD 8. Severe  pulmonary HTN - ? Etiology. She is morbidly obese so need to consider OSA. Recommend outpt sleep study. We will set sleep study up through our office.  She also has COPD which is most likely contributing. PFTs with DLCO pending. VQ scan with no PE.    No new recs at this time.  Will set up outpt sleep study for pulmonary HTN and 30 day heart monitor.  Will sign off.  Will schedule f/u with Dr. Irish Lack in the office.   Kristi Margarita, MD  12/27/2015  10:24 AM

## 2015-12-27 NOTE — Progress Notes (Signed)
Family Medicine Teaching Service Daily Progress Note Intern Pager: [phone]  Patient name: Kristi [name] Medical record number: [id number] Date of birth: 05-20-1941 Age: 75 y.o. Gender: female  Primary Care Provider: Georges Lynch, MD Consultants: Neurology, nephrology, cardiology Code Status: Full   Pt Overview and Major Events to Date:  1/15: Admitted to FPTS  Assessment and Plan: Kristi [name] is a 75 y.o. female presenting with multiple syncopal episodes over the past month, now found to have CVA. PMH is significant for ESRD on dialysis, CAD s/p stent, CHF, DM, COPD, HLD, osteoporosis, Gout, PAH, Afib.   Possible syncope/increased falls over last couple weeks: Hx of multiple syncopal episodes over past several weeks. Could be multiple possible etiologies. History most consistent with orthostatic hypotension (BP 90-100s/40-50s in ED, episode occur with position changes, and reports taking several different pain medications recently due to MSK pains (One Rx was 75 yrs old). DDx includes: Cardiac arrhythmias (known A. Fib and CAD s/p stent and she reports not taking her beta blocker or diuretics for several weeks due to low blood pressures during dialysis); Hypoglycemia (multiple recent hypoglycemic readings and confusion about DM medications), Vertigo (reports spinning sensation). Initial Trop neg. CT Head negative. EKG: 1st degree heart block. ECHO 09/2015: Mild LVH, EF 60%, Mod PAH. Orthostatic vitals here have been normal - telemetry - Cardiology consulted, appreciate their assistance  - 30 day holter monitor  - Midodrine 10 mg BID for low BP  -  Start ASA today 81 mg daily  - Statin   - Holding BB for now while BP is lower - PT/OT consults--> SNF  Acute CVA: Left grip strength weakness prompted MRI shows acute right basal ganglia infarct and question of left parietal occipital infarct. On coumadin with INR of 5.59 on admission. Echo EF 55-60%, G2DD. Carotid U/S 1-39% stenosis  bilaterally. INR 2.25 today - Nephrology consulted, appreciate their assistance and recommendations - Frequent neuro checks - Coumadin per pharm  MSK Pain: Pain likely related to multiple falls. History of osteoporosis and gout. X-rays of left wrist and left tibia/fibula were Negative for fracture. Multiple ecchymosis without swelling. CT head and neck Negative. Left leg pain with no obvious swelling or ecchymosis. Recent x-ray and lower extremity Dopplers negative. History of DVT in left leg. Uric acid WNL. CT Hip WNL.  - Norco: 5/325 q6hrs prn - Continue home Allopurinol 300mg  qd - Consider additional evaluation of left leg pain if not improving with rest  Cardiac: HFpEF (09/2015: Mild LVH, EF 60%, Mod PAH), CAD s/p inferior MI and stent in 06/2007, Afib on Warfarin, HTN, HLD, Hx of DVT.  - Holding home: Lasix and Toprol XL. Pt reports wasn't taking these last few weeks - Warfarin per pharm - Continue Home: Pravastatin 40mg  qhs  ESRD: dialysis T, Th, Sa - Continue home phoslo - Consulted Renal, appreciate their assistance  DM with retinopathy: Multiple recent hypoglycemic episodes. A1c 5.4 11/2015 - Discontinue Glipizide and Lantus - CBGs qAC  COPD:  - Continue home Dulera BID, albuterol every 4 prn  Social: Lives alone. Daughters call daily and have a camera in the house. Camera was not working when patient fell.  - PT/OT recs--> SNF  FEN/GI: Heart healthy; SLIV s/p 500 cc bolus in ED Prophylaxis: Warfarin per pharm for Afib  Disposition: Discharge SNF when medically stable  Subjective:  - Pt complaining of tongue pain secondary to a broken tooth rubbing against her tongues - Endorses a poor appetite but is eating breakfast -  Denies any chest pain or shortness of breath  Objective: Temp:  [97.5 F (36.4 C)-98.2 F (36.8 C)] 98 F (36.7 C) (01/18 0602) Pulse Rate:  [58-70] 69 (01/18 0602) Resp:  [14-18] 18 (01/18 0602) BP: (86-146)/(34-68) 120/46 mmHg (01/18  0602) SpO2:  [95 %-98 %] 97 % (01/18 0602) Weight:  [160 lb 15 oz (73 kg)-163 lb 2.3 oz (74 kg)] 162 lb 0.6 oz (73.5 kg) (01/18 0602) Physical Exam: General: In NAD, sitting up in chair eating breakfast Cardiovascular: regular rate and rhythm, normal s1 and s2, no murmurs. 2+ dorsalis pedis pulse bilaterally Respiratory: normal work of breathing, clear to auscultation bilaterally Abdomen: soft, non distended, non tender, normal bowel sounds Extremities: no edema Skin: Multiple ecchymosis on right wrist, left wrist, left hip  Laboratory:  Recent Labs Lab 12/24/15 1135 12/24/15 1307 12/25/15 0344 12/26/15 0537  WBC 9.3  --  6.9 8.5  HGB 12.4 13.3 11.0* 11.1*  HCT 37.8 39.0 34.2* 34.6*  PLT 189  --  193 170    Recent Labs Lab 12/24/15 1300  12/25/15 0344 12/26/15 0537 12/27/15 0447  NA 136  < > 137 134* 133*  K 4.6  < > 3.7 4.3 4.0  CL 94*  < > 98* 95* 95*  CO2 28  --  27 27 27   BUN 14  < > 18 29* 16  CREATININE 5.37*  < > 6.79* 8.93* 5.60*  CALCIUM 10.5*  --  10.2 10.7* 9.6  PROT 5.7*  --   --   --   --   BILITOT 0.9  --   --   --   --   ALKPHOS 104  --   --   --   --   ALT 7*  --   --   --   --   AST 29  --   --   --   --   GLUCOSE 100*  < > 122* 96 88  < > = values in this interval not displayed.  INR: 5.59  Imaging/Diagnostic Tests: Nm Pulmonary Perf And Vent  12/26/2015  CLINICAL DATA:  Pulmonary hypertension, deep vein thrombosis, question chronic pulmonary embolism, history CHF, hyperlipidemia, coronary artery disease post MI and coronary PTCA, type II diabetes mellitus, CVA, former smoker EXAM: NUCLEAR MEDICINE VENTILATION - PERFUSION LUNG SCAN TECHNIQUE: Ventilation images were obtained in multiple projections using inhaled aerosol Tc-20m DTPA. Perfusion images were obtained in multiple projections after intravenous injection of Tc-80m MAA. RADIOPHARMACEUTICALS:  0000000 millicuries [id number] DTPA aerosol inhalation and 4.2 millicuries [id number] MAA IV  COMPARISON:  None; correlation chest radiograph 12/24/2015 FINDINGS: Ventilation: Normal ventilation lung scan Perfusion: Normal perfusion lung scan IMPRESSION: Normal ventilation and perfusion lung scans. Electronically Signed   By: Lavonia Dana M.D.   On: 12/26/2015 16:50     Carlyle Dolly, MD 12/27/2015, 8:04 AM PGY-1, [place] Intern pager: [phone], text pages welcome

## 2015-12-27 NOTE — Progress Notes (Signed)
[place] for coumadin Indication: atrial fibrillation  Allergies  Allergen Reactions  . Losartan Other (See Comments)    Causes increase in creatinine and worsening CKD  . Chantix [Varenicline] Nausea Only and Other (See Comments)    Dizzy and GI symptoms    Labs:  Recent Labs  12/24/15 1300  12/24/15 1307 12/25/15 0344 12/26/15 0537 12/27/15 0447  HGB  --   < > 13.3 11.0* 11.1*  --   HCT  --   --  39.0 34.2* 34.6*  --   PLT  --   --   --  193 170  --   LABPROT  --   --   --  48.8* 34.5* 24.6*  INR  --   --   --  5.59* 3.52* 2.25*  CREATININE 5.37*  --  5.20* 6.79* 8.93* 5.60*  CKTOTAL 175  --   --   --   --   --   < > = values in this interval not displayed.  Estimated Creatinine Clearance: 7.8 mL/min (by C-G formula based on Cr of 5.6).  Assessment: 75 yo female s/p fall. She is noted on coumadin for afib and pharmay has been consulted to dose. -INR on admission = 5.84, INR trending down = 2.25 today -Patient reports a dose of 5mg /day except 2.5mg  on Tu  Home coumadin dose per clinic notes: 5mg /day except 2.5mg  on TuTh. INR was 2.39 at the last clinic visit  Goal of Therapy:  INR 2-3 Monitor platelets by anticoagulation protocol: Yes   Plan:  Coumadin 2.5 mg po x 1 Daily PT/INR  Thank you Anette Guarneri, PharmD [phone]   12/27/2015 12:03 PM

## 2015-12-27 NOTE — Care Management Note (Signed)
Case Management Note  Patient Details  Name: Kristi [name] MRN: [id number] Date of Birth: 04-12-1941  Subjective/Objective:       Stroke, Afib, HTN             Action/Plan: Scheduled dc to SNF, pt does not want IP rehab. CSW referral for SNF placement.   Expected Discharge Date:  12/28/2015               Expected Discharge Plan:  Skilled Nursing Facility  In-House Referral:  Clinical Social Work  Discharge planning Services  CM Consult  Post Acute Care Choice:  NA Choice offered to:  NA  DME Arranged:  N/A DME Agency:  NA  HH Arranged:  NA HH Agency:  NA  Status of Service:  Completed, signed off  Medicare Important Message Given:    Date Medicare IM Given:    Medicare IM give by:    Date Additional Medicare IM Given:    Additional Medicare Important Message give by:     If discussed at [place] of Stay Meetings, dates discussed:    Additional Comments:  Erenest Rasher, RN 12/27/2015, 12:23 PM

## 2015-12-28 ENCOUNTER — Telehealth: Payer: Self-pay | Admitting: Interventional Cardiology

## 2015-12-28 LAB — CBC
HCT: 34.4 % — ABNORMAL LOW (ref 36.0–46.0)
Hemoglobin: 11.1 g/dL — ABNORMAL LOW (ref 12.0–15.0)
MCH: 33.2 pg (ref 26.0–34.0)
MCHC: 32.3 g/dL (ref 30.0–36.0)
MCV: 103 fL — ABNORMAL HIGH (ref 78.0–100.0)
Platelets: 189 10*3/uL (ref 150–400)
RBC: 3.34 MIL/uL — ABNORMAL LOW (ref 3.87–5.11)
RDW: 14.8 % (ref 11.5–15.5)
WBC: 8.5 10*3/uL (ref 4.0–10.5)

## 2015-12-28 LAB — RENAL FUNCTION PANEL
Albumin: 2.3 g/dL — ABNORMAL LOW (ref 3.5–5.0)
Anion gap: 14 (ref 5–15)
BUN: 32 mg/dL — ABNORMAL HIGH (ref 6–20)
CO2: 24 mmol/L (ref 22–32)
Calcium: 10.4 mg/dL — ABNORMAL HIGH (ref 8.9–10.3)
Chloride: 93 mmol/L — ABNORMAL LOW (ref 101–111)
Creatinine, Ser: 7.64 mg/dL — ABNORMAL HIGH (ref 0.44–1.00)
GFR calc Af Amer: 5 mL/min — ABNORMAL LOW (ref >60)
GFR calc non Af Amer: 5 mL/min — ABNORMAL LOW (ref >60)
Glucose, Bld: 132 mg/dL — ABNORMAL HIGH (ref 65–99)
Phosphorus: 5.6 mg/dL — ABNORMAL HIGH (ref 2.5–4.6)
Potassium: 4.5 mmol/L (ref 3.5–5.1)
Sodium: 131 mmol/L — ABNORMAL LOW (ref 135–145)

## 2015-12-28 LAB — GLUCOSE, CAPILLARY
Glucose-Capillary: 93 mg/dL (ref 65–99)
Glucose-Capillary: 154 mg/dL — ABNORMAL HIGH (ref 65–99)

## 2015-12-28 LAB — PROTIME-INR
INR: 1.62 — ABNORMAL HIGH (ref 0.00–1.49)
Prothrombin Time: 19.3 seconds — ABNORMAL HIGH (ref 11.6–15.2)

## 2015-12-28 IMAGING — CT CT ABDOMEN W/O CM
2 of 4 series · 4 of 46 positions shown, 6 images · non-contrast
Comparison: 01/14/2015 radiograph

CLINICAL DATA: Initial evaluation for abdominal distention and
nausea, gastric outlet obstruction

EXAM:
CT ABDOMEN WITHOUT CONTRAST
TECHNIQUE: Multidetector CT imaging of the abdomen was performed following the
standard protocol without IV contrast.

[Series 2016: coronals · coronal · 0.50mm/px · 3 of 109 slices shown, 4 images]
[im 25/109  soft-tissue]
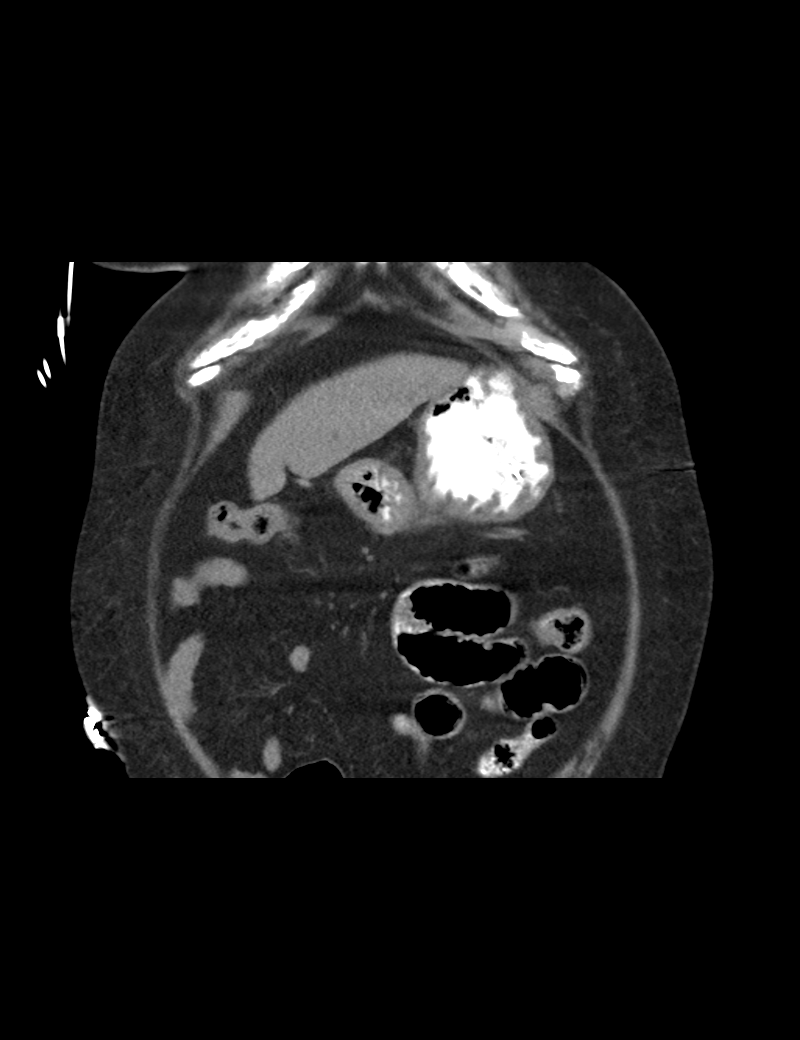
[im 25/109  bone]
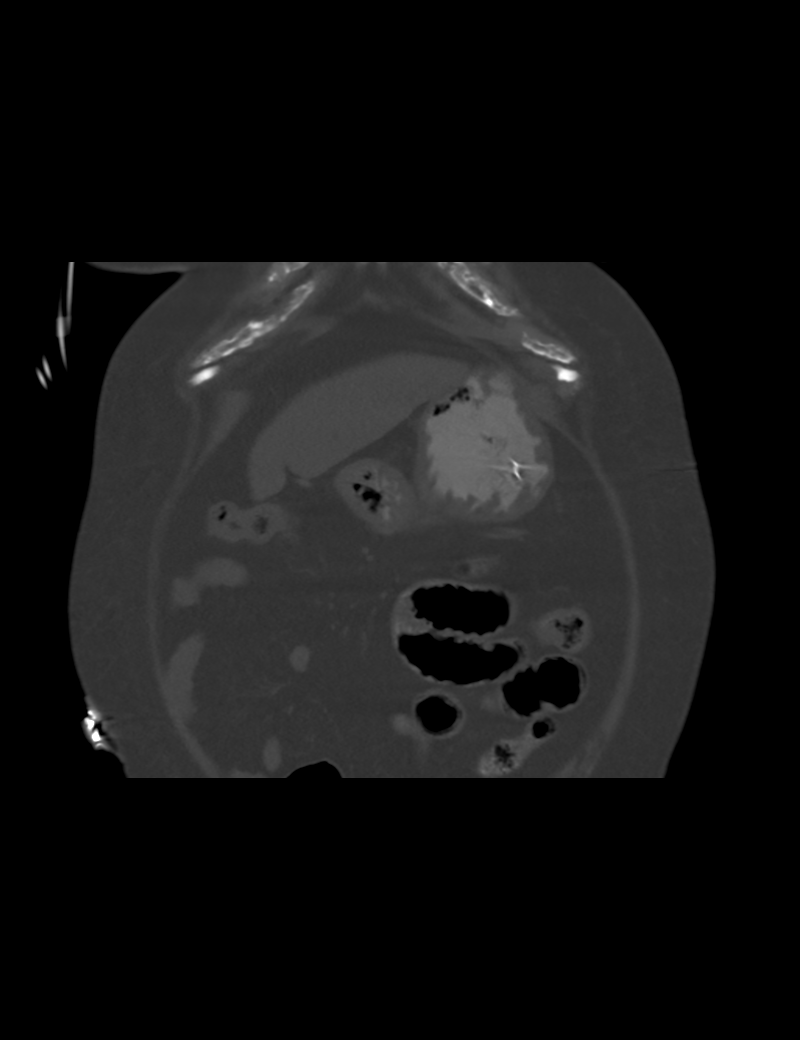
[im 61/109  soft-tissue]
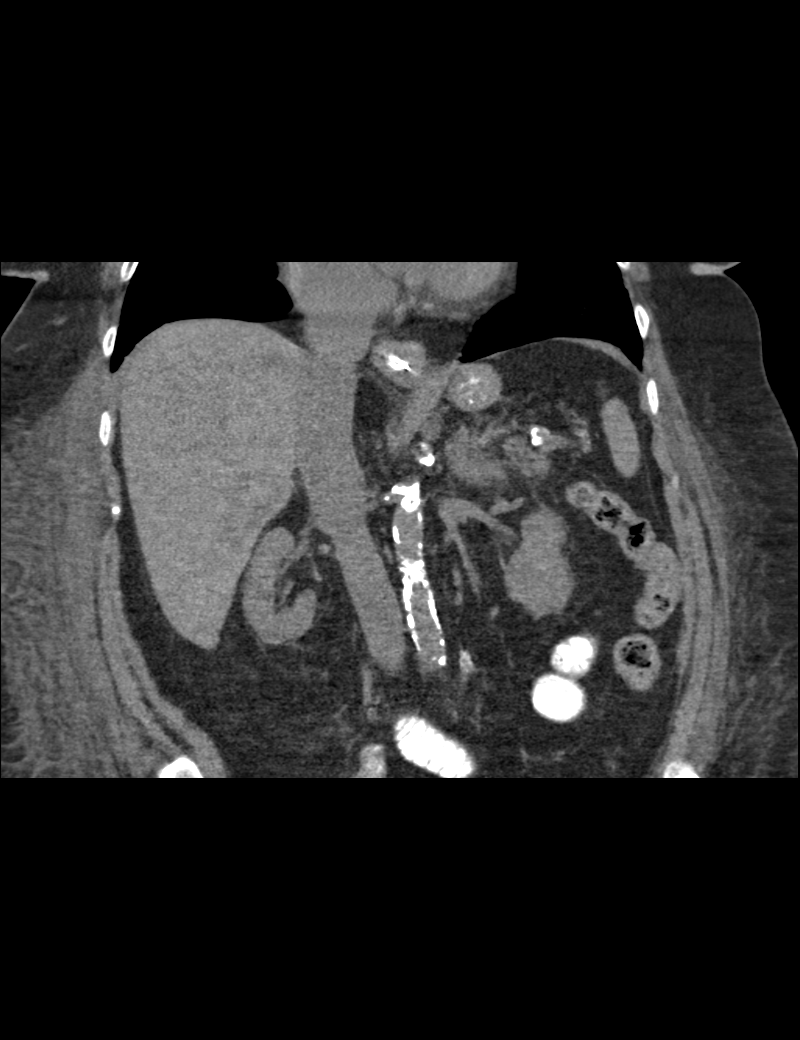
[im 85/109  soft-tissue]
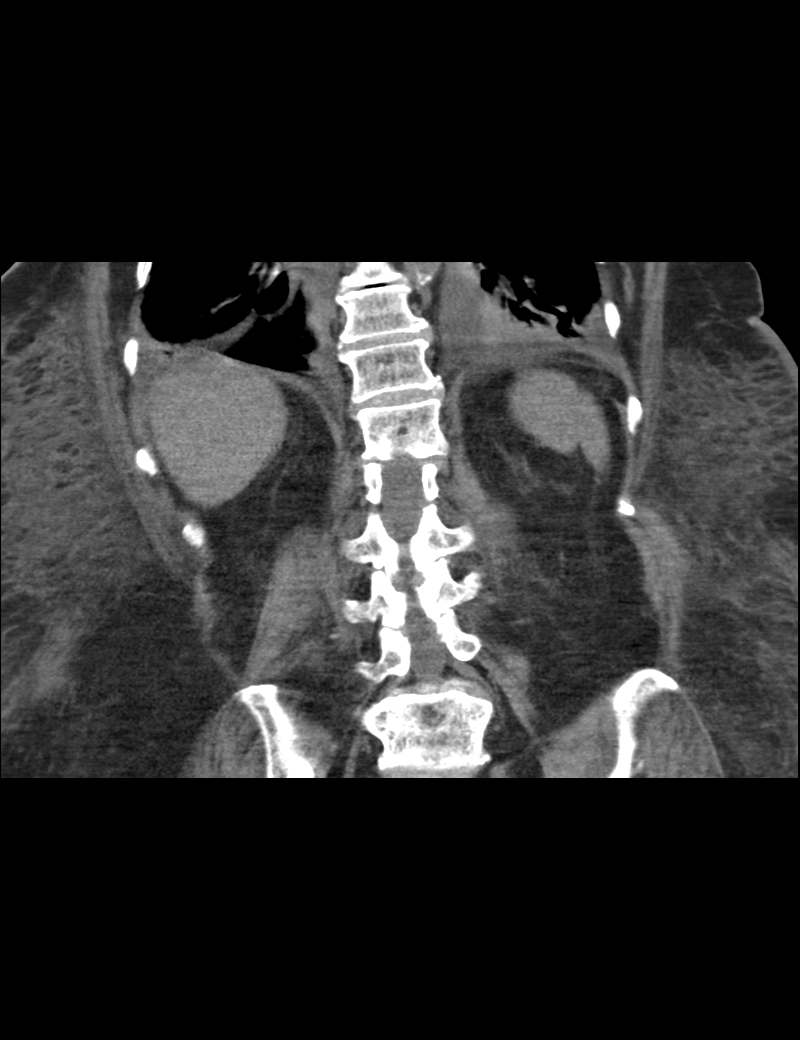

[Series 2017: sagittals · sagittal · 0.50mm/px · 1 of 129 slices shown, 2 images]
[im 43/129  soft-tissue]
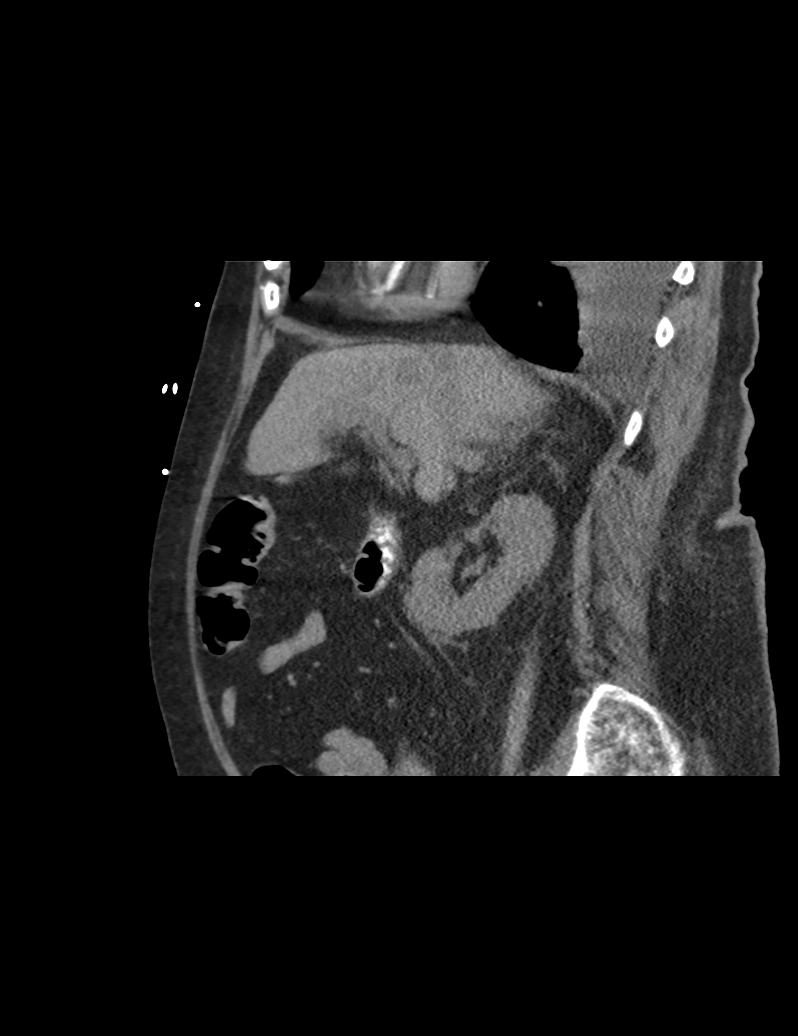
[im 43/129  bone]
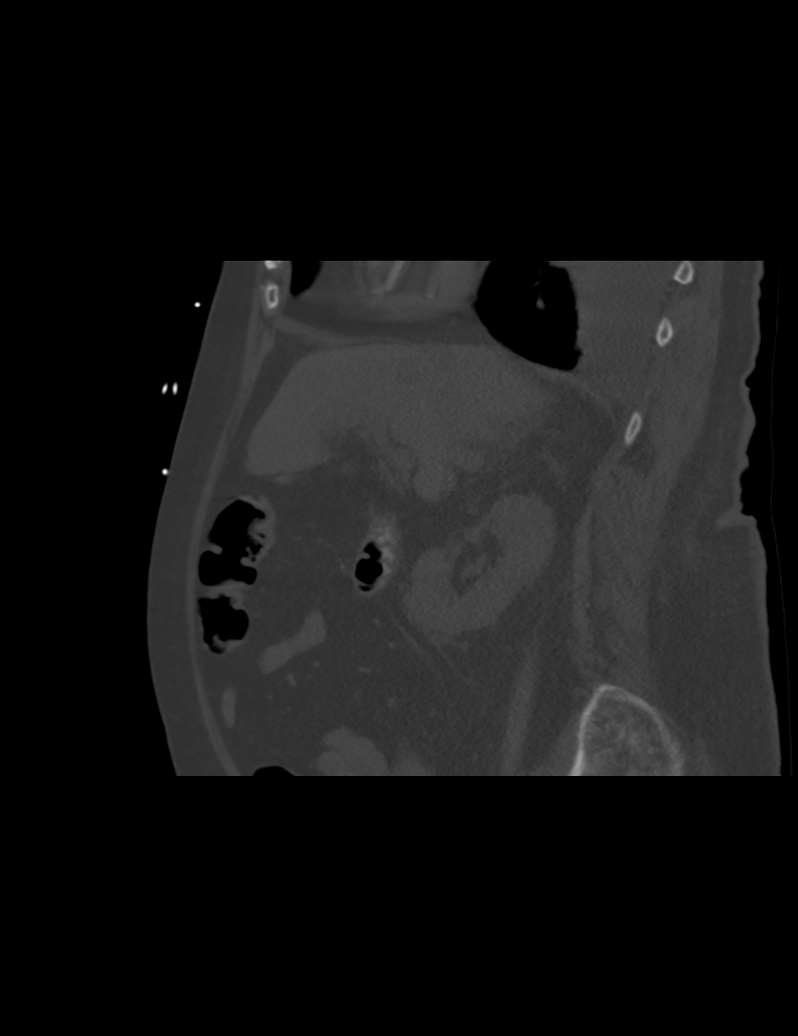

[4 of 46 positions shown; findings below may reference images not displayed]

FINDINGS: An NG tube has been placed which extends into the stomach. There is
diffuse mild gastric wall thickening, with moderate wall thickening
involving the proximal posterior gastric wall. No discrete mass at
the level of the gastroduodenal junction, although mucosal
thickening is noted in this area as well. Oral contrast extends out
of the stomach and throughout much of the visualized small bowel.

Given limited evaluation without IV contrast, liver and spleen are
normal. Mild increased attenuation in the gallbladder suggests the
presence of sludge.

Adrenal glands are normal. Both kidneys appear atrophic.
Calcification of the aortoiliac vessels noted.

No significant retroperitoneal or mesenteric adenopathy.

Significant increased attenuation in the subcutaneous soft tissues
of both flanks. Moderate bilateral pleural effusions with underlying
compressive and deep in did atelectasis. No significant pericardial
effusion. No acute musculoskeletal findings.
IMPRESSION: In this patient with massive gastric gaseous distention on
radiographs performed earlier this evening, an NG tube has been
placed with the administration of oral contrast extending through
the stomach and well into small bowel. There is diffuse gastric wall
thickening, generally mild but more prominent in the proximal
stomach. There is also mucosal thickening in the region of the
pylorus. The stomach is nondistended at this time. Gastric wall
thickening could indicate the presence of gastritis or diffuse
infiltrating neoplasm.

Bilateral soft tissue flank edema and bilateral pleural effusions.

## 2015-12-28 MED ORDER — MIDODRINE HCL 10 MG PO TABS: 10.0000 mg | ORAL_TABLET | Freq: Two times a day (BID) | ORAL | Status: DC

## 2015-12-28 MED ORDER — WARFARIN SODIUM 5 MG PO TABS: 5.0000 mg | ORAL_TABLET | Freq: Once | ORAL | Status: DC

## 2015-12-28 MED ORDER — ASPIRIN 81 MG PO CHEW: 81.0000 mg | CHEWABLE_TABLET | Freq: Every day | ORAL | Status: DC

## 2015-12-28 NOTE — Procedures (Signed)
Tolerating HD BP acceptable. Got midodrine pre treatment. Goal 1500. Using AVF with no trouble at 200cc/min, yellow light. Still has right chest PC.  Will need to get out after another week of cannulations.  [place] C

## 2015-12-28 NOTE — NC FL2 (Signed)
[address] LEVEL OF CARE SCREENING TOOL     IDENTIFICATION  Patient Name: MONTGOMERY BARTOO Birthdate: 10-Apr-1941 Sex: female Admission Date (Current Location): 12/24/2015  [facility] and [place] Number:  Herbalist and Address:  The . [facility], [place] [address] [place], [place] [address]      Provider Number: [id number]  Attending Physician Name and Address:  Dickie La, MD  Relative Name and Phone Number:       Current Level of Care: Hospital Recommended Level of Care: [place] Prior Approval Number:    Date Approved/Denied: 12/28/15 PASRR Number: [id number]  Discharge Plan: SNF    Current Diagnoses: Patient Active Problem List   Diagnosis Date Noted  . Cerebrovascular accident (CVA) due to occlusion of precerebral artery ([place])   . 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 Hemiparesis affecting left side as late effect of stroke ([place])   . History of CVA (cerebrovascular accident)   . Chronic systolic congestive heart failure ([place])   . Chronic obstructive pulmonary disease ([place])   . Paroxysmal atrial fibrillation (HCC)   . Orthostasis   . Macrocytic anemia   . Cerebral infarction ([place])   . Syncope 12/24/2015  . Hip pain   . ESRD on dialysis ([place])   . Fall   . Near syncope   . Loss of weight   . Appetite loss   . Constipation   . Wrist pain, acute 12/22/2015  . Pain of left lower leg 12/22/2015  . Encounter for therapeutic drug monitoring 03/17/2015  . Thrombocytopenia ([place])   . Gastric outlet obstruction   . ESRD needing dialysis ([place])   . Atrial fibrillation ([place]) 01/13/2015  . Proliferative diabetic retinopathy ([place]) 12/15/2014  . Pulmonary artery hypertension ([place]) 10/31/2014  . Chronic kidney disease (CKD), stage IV (severe) ([place])   . Acute on chronic diastolic HF (heart failure) ([place])   . Essential hypertension, benign   . Chronic right shoulder pain  09/16/2014  . Coronary atherosclerosis of native coronary artery 04/06/2014  . Old myocardial infarction 04/06/2014  . Gout 04/01/2014  . Chronic back pain 07/21/2013  . CHF (congestive heart failure) ([employer]) 11/01/2012  . Diabetic retinopathy associated with type 2 diabetes mellitus ([place]) 09/29/2012  . Osteoporosis 09/11/2012  . Lower extremity edema 09/20/2011  . Obesity 09/20/2011  . History of tobacco abuse 01/11/2011  . MONOCLONAL GAMMOPATHY 07/02/2010  . GERD 03/26/2010  . HYPERLIPIDEMIA 02/19/2010  . COPD (chronic obstructive pulmonary disease) ([place]) 02/19/2010    Orientation RESPIRATION BLADDER Height & Weight    Self, Time, Situation, Place  Normal Continent 5' (152.4 cm) 164 lbs.  BEHAVIORAL SYMPTOMS/MOOD NEUROLOGICAL BOWEL NUTRITION STATUS      Continent Diet (Regular)  AMBULATORY STATUS COMMUNICATION OF NEEDS Skin   Limited Assist Verbally Normal                       Personal Care Assistance Level of Assistance  Bathing, Feeding, Dressing Bathing Assistance: Limited assistance Feeding assistance: Limited assistance Dressing Assistance: Limited assistance     Functional Limitations Info  Sight, Hearing, Speech Sight Info: Adequate Hearing Info: Adequate Speech Info: Adequate    SPECIAL CARE FACTORS FREQUENCY  PT (By licensed PT), OT (By licensed OT)     PT Frequency: 5x OT Frequency: 3x            Contractures      Additional Factors  Info  Code Status, Allergies Code Status Info: Full Allergies Info: Losartan, chantix           Current Medications (12/28/2015):  This is the current hospital active medication list Current Facility-Administered Medications  Medication Dose Route Frequency Provider Last Rate Last Dose  . 0.9 %  sodium chloride infusion  100 mL Intravenous PRN Alric Seton, PA-C      . 0.9 %  sodium chloride infusion  100 mL Intravenous PRN Alric Seton, PA-C      . albuterol (PROVENTIL) (2.5 MG/3ML) 0.083% nebulizer  solution 2.5 mg  2.5 mg Inhalation Q4H PRN Olam Idler, MD      . allopurinol (ZYLOPRIM) tablet 300 mg  300 mg Oral Daily Olam Idler, MD   300 mg at 12/27/15 0936  . alteplase (CATHFLO ACTIVASE) injection 2 mg  2 mg Intracatheter Once PRN Alric Seton, PA-C      . aspirin chewable tablet 81 mg  81 mg Oral Daily Ashly M Gottschalk, DO   81 mg at 12/27/15 1248  . calcium acetate (PHOSLO) capsule 667 mg  667 mg Oral TID WC Dickie La, MD   667 mg at 12/27/15 1721  . heparin injection 1,000 Units  1,000 Units Dialysis PRN Alric Seton, PA-C      . heparin injection 1,500 Units  20 Units/kg Dialysis PRN Alric Seton, PA-C      . HYDROcodone-acetaminophen (NORCO/VICODIN) 5-325 MG per tablet 1 tablet  1 tablet Oral Q6H PRN Olam Idler, MD   1 tablet at 12/25/15 1417  . lidocaine (PF) (XYLOCAINE) 1 % injection 5 mL  5 mL Intradermal PRN Alric Seton, PA-C      . lidocaine-prilocaine (EMLA) cream 1 application  1 application Topical PRN Alric Seton, PA-C      . midodrine (PROAMATINE) tablet 10 mg  10 mg Oral BID WC Dickie La, MD   10 mg at 12/28/15 0916  . mometasone-formoterol (DULERA) 100-5 MCG/ACT inhaler 2 puff  2 puff Inhalation BID Olam Idler, MD   2 puff at 12/28/15 0841  . pentafluoroprop-tetrafluoroeth (GEBAUERS) aerosol 1 application  1 application Topical PRN Alric Seton, PA-C      . pravastatin (PRAVACHOL) tablet 40 mg  40 mg Oral q1800 Dickie La, MD   40 mg at 12/27/15 1723  . sodium chloride 0.9 % injection 3 mL  3 mL Intravenous Q12H Olam Idler, MD   3 mL at 12/27/15 2033  . technetium TC 76M diethylenetriame-pentaacetic acid (DTPA) injection 31 milli Curie  31 milli Curie Inhalation Once PRN Sueanne Margarita, MD      . Warfarin - Pharmacist Dosing Inpatient   Does not apply q1800 Dickie La, MD         Discharge Medications: Please see discharge summary for a list of discharge medications.  Relevant Imaging Results:  Relevant Lab  Results:   Additional Information SS#: [id number]  [place] intern  [phone]

## 2015-12-28 NOTE — Telephone Encounter (Signed)
New Message  The pt is in the hospital. The patient fell. The pt broke her tooht. While in the hospital they will need clearance to pull the toothe. Pt daughter says that the hospital is dragging their feet.  (Cannot fill out Surg. Clearance dot phrase because this is being done in the hospital)

## 2015-12-28 NOTE — Consult Note (Signed)
   [facility] CM Inpatient Consult   12/28/2015  Kristi [name] Apr 18, 1941 [id number] Patient was assessed for [place] Management for community services as a benefit of her [employer] Medicare. Spoke with the patient in room prior to discharge to a skilled facility. Patient states she would not mind [place] Management for follow up once she gets back home from the rehab facility.  Explained that [place] Management services does not replace or interfere with any services that are arranged by inpatient case management or social work. Will attempt follow up in the community once returning home.  For additional questions or referrals please contact: Natividad Brood, RN BSN [facility] Liaison  [phone] business mobile phone Toll free office [phone]

## 2015-12-28 NOTE — Telephone Encounter (Signed)
**Note De-identified  Obfuscation** Please advise 

## 2015-12-28 NOTE — Progress Notes (Signed)
Patient has bed at [facility]. Disharge order in place. IV and telemetry removed. Patient belongings packed. Report given to receiving RN. Patient now awaiting transport.

## 2015-12-28 NOTE — Discharge Instructions (Signed)
You were admitted for a fall likely from low blood pressure and a new stroke. Neurology, nephrology, and cardiology have seen you and you received hemodialysis. You will be sent to a skilled nursing facility to build your strength.  Please take your medications as instructed on these discharge instructions.  You will need to follow up with cardiology, they have scheduled appointments for you listed above.

## 2015-12-28 NOTE — Progress Notes (Signed)
Family Medicine Teaching Service Daily Progress Note Intern Pager: [phone]  Patient name: Kristi [name] Medical record number: [id number] Date of birth: Oct 24, 1941 Age: 75 y.o. Gender: female  Primary Care Provider: Georges Lynch, MD Consultants: Neurology, nephrology, cardiology Code Status: Full   Pt Overview and Major Events to Date:  1/15: Admitted to FPTS  Assessment and Plan: Kristi [name] is a 75 y.o. female presenting with multiple syncopal episodes over the past month, now found to have CVA. PMH is significant for ESRD on dialysis, CAD s/p stent, CHF, DM, COPD, HLD, osteoporosis, Gout, PAH, Afib.   Possible syncope/increased falls over last couple weeks: Hx of multiple syncopal episodes over past several weeks. Could be multiple possible etiologies. History most consistent with orthostatic hypotension (BP 90-100s/40-50s in ED, episode occur with position changes, and reports taking several different pain medications recently due to MSK pains (One Rx was 75 yrs old). DDx includes: Cardiac arrhythmias (known A. Fib and CAD s/p stent and she reports not taking her beta blocker or diuretics for several weeks due to low blood pressures during dialysis); Hypoglycemia (multiple recent hypoglycemic readings and confusion about DM medications), Vertigo (reports spinning sensation). Initial Trop neg. CT Head negative. EKG: 1st degree heart block. ECHO 09/2015: Mild LVH, EF 60%, Mod PAH. Orthostatic vitals here have been normal - telemetry - Cardiology consulted, appreciate their assistance  - 30 day holter monitor  - Midodrine 10 mg BID for low BP  -  ASA 81 mg daily  - Statin   - Holding BB for now while BP is lower - PT/OT consults--> SNF  Acute CVA: Left grip strength weakness prompted MRI shows acute right basal ganglia infarct and question of left parietal occipital infarct. On coumadin with INR of 5.59 on admission. Echo EF 55-60%, G2DD. Carotid U/S 1-39% stenosis bilaterally. INR 2.25  today - Nephrology consulted, appreciate their assistance and recommendations - Frequent neuro checks - Coumadin per pharm  MSK Pain: Pain likely related to multiple falls. History of osteoporosis and gout. X-rays of left wrist and left tibia/fibula were Negative for fracture. Multiple ecchymosis without swelling. CT head and neck Negative. Left leg pain with no obvious swelling or ecchymosis. Recent x-ray and lower extremity Dopplers negative. History of DVT in left leg. Uric acid WNL. CT Hip WNL.  - Norco: 5/325 q6hrs prn - Continue home Allopurinol 300mg  qd - Consider additional evaluation of left leg pain if not improving with rest  Cardiac: HFpEF (09/2015: Mild LVH, EF 60%, Mod PAH), CAD s/p inferior MI and stent in 06/2007, Afib on Warfarin, HTN, HLD, Hx of DVT.  - Holding home: Lasix and Toprol XL. Pt reports wasn't taking these last few weeks - Warfarin per pharm - Continue Home: Pravastatin 40mg  qhs  ESRD: dialysis T, Th, Sa - Continue home phoslo - Consulted Renal, appreciate their assistance  DM with retinopathy: Multiple recent hypoglycemic episodes. A1c 5.4 11/2015 - Discontinue Glipizide and Lantus - CBGs qAC  COPD:  - Continue home Dulera BID, albuterol every 4 prn  Social: Lives alone. Daughters call daily and have a camera in the house. Camera was not working when patient fell.  - PT/OT recs--> SNF  FEN/GI: Heart healthy; SLIV s/p 500 cc bolus in ED Prophylaxis: Warfarin per pharm for Afib  Disposition: Discharge SNF   Subjective:  - Pt complaining of tongue pain secondary to a broken tooth rubbing against her tongue - Still has some left leg pain - Denies any chest pain or shortness  of breath  Objective: Temp:  [98 F (36.7 C)-98.1 F (36.7 C)] 98 F (36.7 C) (01/19 0313) Pulse Rate:  [65-73] 65 (01/19 0313) Resp:  [18-19] 18 (01/19 0313) BP: (122-133)/(45-53) 133/51 mmHg (01/19 0313) SpO2:  [93 %-98 %] 98 % (01/19 0313) Weight:  [164 lb 3.2 oz  (74.481 kg)] 164 lb 3.2 oz (74.481 kg) (01/19 0313) Physical Exam: General: In NAD, sitting up in chair eating breakfast Cardiovascular: regular rate and rhythm, normal s1 and s2, no murmurs. 2+ dorsalis pedis pulse bilaterally Respiratory: normal work of breathing, clear to auscultation bilaterally Abdomen: soft, non distended, non tender, normal bowel sounds Extremities: no edema Skin: Multiple ecchymosis on right wrist, left wrist, left hip  Laboratory:  Recent Labs Lab 12/25/15 0344 12/26/15 0537 12/27/15 1519  WBC 6.9 8.5 8.9  HGB 11.0* 11.1* 11.2*  HCT 34.2* 34.6* 33.8*  PLT 193 170 159    Recent Labs Lab 12/24/15 1300  12/26/15 0537 12/27/15 0447 12/27/15 1519  NA 136  < > 134* 133* 133*  K 4.6  < > 4.3 4.0 4.3  CL 94*  < > 95* 95* 93*  CO2 28  < > 27 27 28   BUN 14  < > 29* 16 23*  CREATININE 5.37*  < > 8.93* 5.60* 6.38*  CALCIUM 10.5*  < > 10.7* 9.6 10.3  PROT 5.7*  --   --   --   --   BILITOT 0.9  --   --   --   --   ALKPHOS 104  --   --   --   --   ALT 7*  --   --   --   --   AST 29  --   --   --   --   GLUCOSE 100*  < > 96 88 138*  < > = values in this interval not displayed.   Imaging/Diagnostic Tests: No results found.   Carlyle Dolly, MD 12/28/2015, 7:23 AM PGY-1, [facility] Intern pager: [phone], text pages welcome

## 2015-12-28 NOTE — Telephone Encounter (Signed)
They will need an inpatient cardiology consult

## 2015-12-28 NOTE — Telephone Encounter (Signed)
**Note De-Identified  Obfuscation** The pts daughter is advised and she verbalized understanding. She states that she will talk with the pts nurse at the hosp and ask for a inpatient cardiology consult. She thanked me for returning her call.

## 2015-12-28 NOTE — Clinical Social Work Placement (Cosign Needed)
   CLINICAL SOCIAL WORK PLACEMENT  NOTE  Date:  12/28/2015  Patient Details  Name: POORVI KAPALA MRN: [id number] Date of Birth: 06/17/1941  Clinical Social Work is seeking post-discharge placement for this patient at the [place] level of care (*CSW will initial, date and re-position this form in  chart as items are completed):  Yes   Patient/family provided with [place] Work Department's list of facilities offering this level of care within the geographic area requested by the patient (or if unable, by the patient's family).  Yes   Patient/family informed of their freedom to choose among providers that offer the needed level of care, that participate in Medicare, Medicaid or managed care program needed by the patient, have an available bed and are willing to accept the patient.  Yes   Patient/family informed of [place]'s ownership interest in [facility] and [facility], as well as of the fact that they are under no obligation to receive care at these facilities.  PASRR submitted to EDS on 12/28/15     PASRR number received on 12/28/15     Existing PASRR number confirmed on       FL2 transmitted to all facilities in geographic area requested by pt/family on 12/28/15     FL2 transmitted to all facilities within larger geographic area on 12/28/15     Patient informed that his/her managed care company has contracts with or will negotiate with certain facilities, including the following:            Patient/family informed of bed offers received.  Patient chooses bed at       Physician recommends and patient chooses bed at      Patient to be transferred to   on  .  Patient to be transferred to facility by       Patient family notified on   of transfer.  Name of family member notified:        PHYSICIAN       Additional Comment:    _______________________________________________ [place] intern   [phone]

## 2015-12-29 ENCOUNTER — Ambulatory Visit: Payer: Commercial Managed Care - HMO | Admitting: Family Medicine

## 2015-12-29 ENCOUNTER — Telehealth: Payer: Self-pay | Admitting: Interventional Cardiology

## 2015-12-29 NOTE — Telephone Encounter (Signed)
New Message  Pt dtr is following up w/ RN- did not specify nature of phone call. Please call back and discuss.

## 2015-12-29 NOTE — Telephone Encounter (Signed)
The pts daughter states that as soon as she got message, per Dr Irish Lack to get inpatient cardiology consult for the pts dental procedure to repair broken tooth, the pt was released from the hospital. She is requesting clearance so the pt can have her tooth repaired. She is advised that I need the dentist's name, fax number and clearance requirements.    She states that she will contact the pts dentist and ask them to fax [place] a clearance form. She has been given our fax number to give to the dentist.

## 2015-12-30 NOTE — Progress Notes (Signed)
Pt transferred to:  [facility] Anticipated date of transfer:  12/28/15 Transported by: Ambulance Time Tentatively Scheduled for: 2 PM Family notified: Daughter Robet Leu Nursing provided report number to call report to facility.  DC summary sent to facility.  Patient and daughter are pleased with d/c plan. Patient receives dialysis treatments at the [facility] facility on Tuesday , Thursday and Saturdays- first shift.  Facility notified of same.  No further CSW needs identified.  CSW signing off.  Kendell Bane, LCSW [phone]

## 2015-12-30 NOTE — Clinical Social Work Placement (Signed)
   CLINICAL SOCIAL WORK PLACEMENT  NOTE  Date:  12/28/2015 Patient Details  Name: Kristi [name] MRN: [id number] Date of Birth: September 17, 1941  Clinical Social Work is seeking post-discharge placement for this patient at the [place] level of care (*CSW will initial, date and re-position this form in  chart as items are completed):  Yes   Patient/family provided with [place] Work Department's list of facilities offering this level of care within the geographic area requested by the patient (or if unable, by the patient's family).  Yes   Patient/family informed of their freedom to choose among providers that offer the needed level of care, that participate in Medicare, Medicaid or managed care program needed by the patient, have an available bed and are willing to accept the patient.  Yes   Patient/family informed of [place]'s ownership interest in [facility] and [facility], as well as of the fact that they are under no obligation to receive care at these facilities.  PASRR submitted to EDS on 12/28/15     PASRR number received on 12/28/15     Existing PASRR number confirmed on       FL2 transmitted to all facilities in geographic area requested by pt/family on 12/28/15     FL2 transmitted to all facilities within larger geographic area on 12/28/15     Patient informed that his/her managed care company has contracts with or will negotiate with certain facilities, including the following:        Yes   Patient/family informed of bed offers received.  Patient chooses bed at [facility]     Physician recommends and patient chooses bed at      Patient to be transferred to [facility] on 12/28/15.  Patient to be transferred to facility by Ambulance [facility])     Patient family notified on 12/28/15 of transfer.  Name of family member notified:  Atlee Abide- Daughter     PHYSICIAN Please prepare priority  discharge summary, including medications, Please sign FL2, Please prepare prescriptions     Additional Comment:    _______________________________________________ Williemae Area, LCSW 12/30/2015, 7:59 PM

## 2016-01-01 IMAGING — DX DG CHEST 1V
1 series · 1 of 1 positions shown · non-contrast
Comparison: 01/12/2015

CLINICAL DATA: Shortness of breath and weakness

EXAM:
CHEST  1 VIEW

[chest ap]
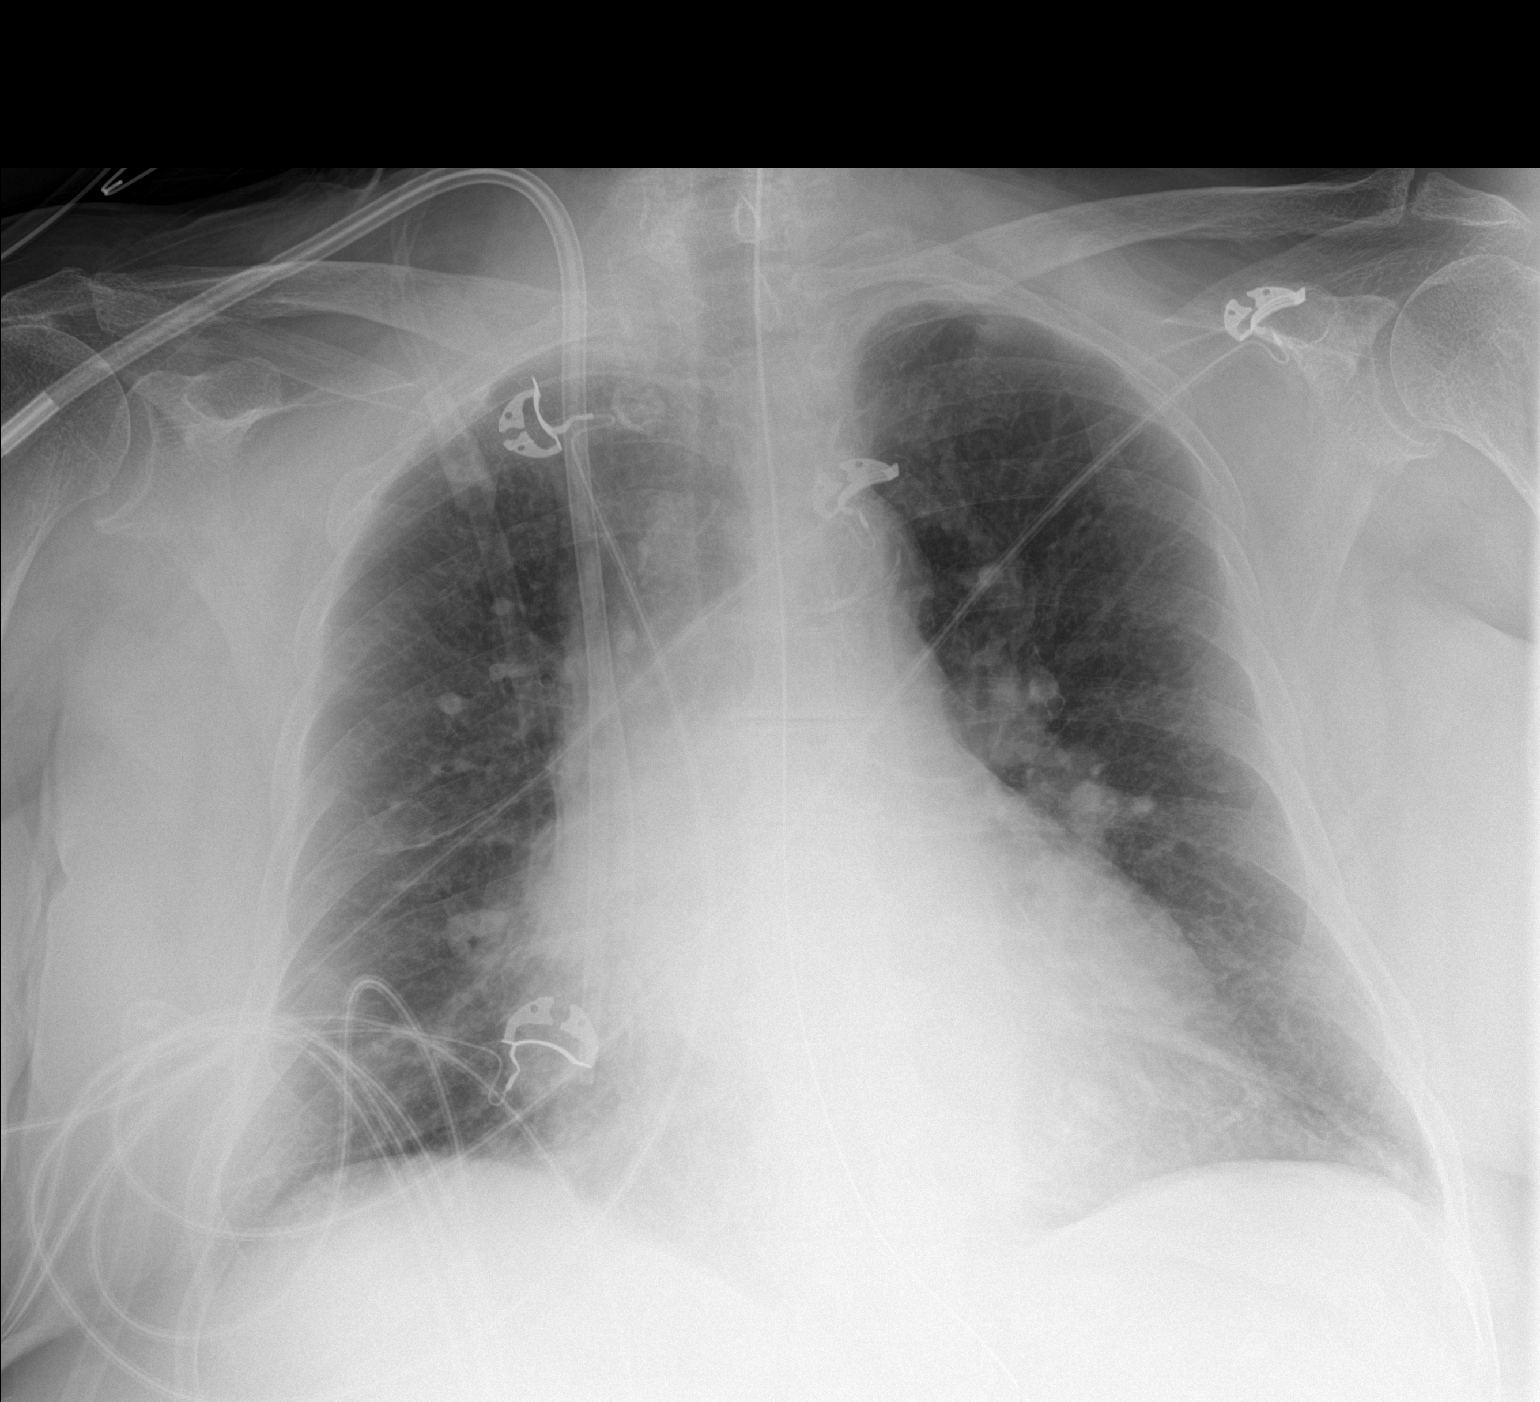

[1 of 1 positions shown; findings below may reference images not displayed]

FINDINGS: Dialysis catheter from right IJ approach is in stable position, tips
in the right atrium. There is a new orogastric tube which enters the
stomach, with side port likely at the lower margin of the image.

There is improved aeration since comparison. No edema, effusion, or
pneumothorax.

Stable cardiomegaly.
IMPRESSION: 1. New nasogastric tube is in good position.
2. Interval clearing of previously noted opacities.

## 2016-01-05 NOTE — Telephone Encounter (Signed)
Opal Sidles, NP from [facility] called to clarify Coumadin clearance.  Pt has 1 tooth being pulled on Monday.  We had sent clearance that patient should remain on Coumadin for 1 extraction given history of CVA.  They did not receive this fax until today and have held pt's Coumadin.  Her INR is 1.1.  Her question is if we should place the patient on Lovenox.  Pt has ESRD so suggested lower dose of Lovenox 60mg  once daily for 2 days.  Will not give dose on Sunday so risk of bleeding will be minimal with extraction.  Pt has already been restarted on Coumadin 5mg  daily.

## 2016-01-11 ENCOUNTER — Telehealth: Payer: Self-pay | Admitting: *Deleted

## 2016-01-11 NOTE — Telephone Encounter (Signed)
Patient is overdue for her INR recheck. Spoke with Sharyn Lull at [facility] and gave them an order to draw an INR on 01/13/16 & call or fax to CVRR; and she verbalized understanding.

## 2016-01-16 LAB — PROTIME-INR: INR: 1.7 — AB (ref 0.9–1.1)

## 2016-01-17 ENCOUNTER — Telehealth: Payer: Self-pay | Admitting: *Deleted

## 2016-01-17 NOTE — Telephone Encounter (Signed)
Spoke with Kristi [name] at Dr Raynelle Dick office and she wanted a report of recent INR results  as pt is scheduled for extraction of 1 tooth on Friday Feb 10th and she is not to hold coumadin for this  Pt had been at [facility] and her coumadin and INR checks had been done there  but she went home on Feb 5th. Called dialysis and she states they did INR on her on 01/16/2016 and will sent results to our clinic today and then we will send results to  Kristi [name] at Dr Raynelle Dick office as soon as obtained  and did notify Loree Fee of this and she states understanding

## 2016-01-17 NOTE — Telephone Encounter (Signed)
Roselie Awkward, Physical Therapist with [facility] called to request verbal orders for continuation of care.  Requesting visits twice a week for 2 weeks and once a week for one week.  Please give him a call at [phone].  Derl Barrow, RN

## 2016-01-17 NOTE — Telephone Encounter (Signed)
Mya, Occupational Therapist with [facility] is requesting verbal orders to occupational health home visits with patient. Please call [phone].  Derl Barrow, RN

## 2016-01-17 NOTE — Telephone Encounter (Signed)
Verbal order given  

## 2016-01-18 ENCOUNTER — Ambulatory Visit (INDEPENDENT_AMBULATORY_CARE_PROVIDER_SITE_OTHER): Payer: Commercial Managed Care - HMO | Admitting: Internal Medicine

## 2016-01-18 DIAGNOSIS — I482 Chronic atrial fibrillation, unspecified: Secondary | ICD-10-CM

## 2016-01-18 DIAGNOSIS — Z5181 Encounter for therapeutic drug level monitoring: Secondary | ICD-10-CM

## 2016-01-22 ENCOUNTER — Other Ambulatory Visit: Payer: Self-pay | Admitting: *Deleted

## 2016-01-22 DIAGNOSIS — G4733 Obstructive sleep apnea (adult) (pediatric): Secondary | ICD-10-CM

## 2016-01-24 ENCOUNTER — Telehealth: Payer: Self-pay | Admitting: Family Medicine

## 2016-01-24 NOTE — Telephone Encounter (Signed)
Pt fell yesterday. She is sore. Ems came and got her back into her chair.  There is no bruising. Nurse has questions about some of the medications.  [facility] Nurse

## 2016-01-25 ENCOUNTER — Ambulatory Visit (INDEPENDENT_AMBULATORY_CARE_PROVIDER_SITE_OTHER): Payer: Commercial Managed Care - HMO | Admitting: Family Medicine

## 2016-01-25 ENCOUNTER — Encounter: Payer: Self-pay | Admitting: Family Medicine

## 2016-01-25 VITALS — BP 133/56 | HR 82 | Temp 97.7°F | Wt 162.0 lb

## 2016-01-25 DIAGNOSIS — M546 Pain in thoracic spine: Secondary | ICD-10-CM | POA: Diagnosis not present

## 2016-01-25 MED ORDER — TRAMADOL HCL 50 MG PO TABS: 50.0000 mg | ORAL_TABLET | Freq: Two times a day (BID) | ORAL | Status: DC | PRN

## 2016-01-25 NOTE — Progress Notes (Signed)
    Subjective   Kristi [name] is a 75 y.o. female that presents for a same day visit  1. Right side pain: She reports some right side pain. She reports the pain started several months ago and has continued to worsen. She has a history of frequent falls and was previously at [facility] for rehab. She has been using asper cream which has not been helping. She states she is still having falls at home. She currently lives alone. She receives physical therapy at home. She receives dialysis Tuesday, Thursday and Saturday. She urinates a little bit. No dysuria.   ROS Per HPI  Social History  Substance Use Topics  . Smoking status: Former Smoker -- 0.50 packs/day for 53 years    Types: Cigarettes    Start date: 12/09/1960    Quit date: 03/31/2014  . Smokeless tobacco: Never Used  . Alcohol Use: 0.0 oz/week    0 Standard drinks or equivalent per week     Comment: occasional    Allergies  Allergen Reactions  . Losartan Other (See Comments)    Causes increase in creatinine and worsening CKD  . Chantix [Varenicline] Nausea Only and Other (See Comments)    Dizzy and GI symptoms     Objective   BP 133/56 mmHg  Pulse 82  Temp(Src) 97.7 F (36.5 C) (Oral)  Wt 162 lb (73.483 kg)  General: Well appearing, no distress Respiratory/Chest: Clear to auscultation bilaterally. Unlabored work of breathing. No wheezing or rales. Musculoskeletal: Kyphotic resting posture. Right sided thoracic tenderness over ribs and intercostal spaces Skin: No vesicular rash noted  Assessment and Plan   1. Right-sided thoracic back pain Most likely muscular. Multiple falls, however, I do not think related to a broken rib. If symptoms do not improve, however, would get rib films to make sure. Even then, management will likely not change. Patient to continue strengthening with home health physical therapy. Will need medications to be adjusted as well. - traMADol (ULTRAM) 50 MG tablet; Take 1 tablet (50 mg total)  by mouth every 12 (twelve) hours as needed.  Dispense: 30 tablet; Refill: 0

## 2016-01-25 NOTE — Telephone Encounter (Signed)
Attempted to call Maudie Mercury, RN back.  LVM as there was no answer.  I am covering for Dr Alease Frame and will attempt to discuss this patient as able.

## 2016-01-25 NOTE — Patient Instructions (Signed)
Thank you for coming to see me today. It was a pleasure. Today we talked about:   Back pain: this is likely related to poor posture and back strain. I will prescribe a short course of Tramadol therapy. Please talk to your physical therapist about your back to maybe get some help with strengthening exercises.  Please make an appointment to see Dr. Garen Lah in 2-4 weeks for follow-up.  If you have any questions or concerns, please do not hesitate to call the office at [phone].  Sincerely,  Cordelia Poche, MD

## 2016-02-01 LAB — PROTIME-INR: INR: 1.9 — AB (ref 0.9–1.1)

## 2016-02-02 ENCOUNTER — Ambulatory Visit (INDEPENDENT_AMBULATORY_CARE_PROVIDER_SITE_OTHER): Payer: Commercial Managed Care - HMO | Admitting: Cardiovascular Disease

## 2016-02-02 DIAGNOSIS — Z5181 Encounter for therapeutic drug level monitoring: Secondary | ICD-10-CM

## 2016-02-02 DIAGNOSIS — I482 Chronic atrial fibrillation, unspecified: Secondary | ICD-10-CM

## 2016-02-06 ENCOUNTER — Encounter: Payer: Commercial Managed Care - HMO | Admitting: Physician Assistant

## 2016-02-12 ENCOUNTER — Telehealth: Payer: Self-pay | Admitting: Family Medicine

## 2016-02-12 ENCOUNTER — Encounter: Payer: Self-pay | Admitting: Family Medicine

## 2016-02-12 ENCOUNTER — Ambulatory Visit (INDEPENDENT_AMBULATORY_CARE_PROVIDER_SITE_OTHER): Payer: Commercial Managed Care - HMO | Admitting: Family Medicine

## 2016-02-12 VITALS — BP 100/50 | HR 78 | Temp 98.3°F | Ht 60.0 in | Wt 164.0 lb

## 2016-02-12 DIAGNOSIS — R310 Gross hematuria: Secondary | ICD-10-CM | POA: Diagnosis not present

## 2016-02-12 DIAGNOSIS — N2581 Secondary hyperparathyroidism of renal origin: Secondary | ICD-10-CM

## 2016-02-12 DIAGNOSIS — R109 Unspecified abdominal pain: Secondary | ICD-10-CM | POA: Diagnosis not present

## 2016-02-12 DIAGNOSIS — R42 Dizziness and giddiness: Secondary | ICD-10-CM | POA: Diagnosis not present

## 2016-02-12 NOTE — Assessment & Plan Note (Signed)
Patient is here with complaints of vertigo over the past 2 months. Etiology currently unknown. Differential includes hypotension, BPPV (Dix-Hallpike negative), sequela of patient with history of stroke, cranial nerve dysfunction secondary to uncontrolled diabetes, heart arrhythmia, idiopathic. Patient states that her symptoms are improved with midodrine. - I've asked patient to schedule her midodrine and take as prescribed (BID). - I have asked that patient discuss these symptoms with her nephrologist as I feel the most likely reversible cause is from patient's hypotension secondary to hemodialysis and fluid status. Patient stated her understanding. - We'll continue to follow/monitor.

## 2016-02-12 NOTE — Addendum Note (Signed)
Addended byGeorges Lynch D on: 02/12/2016 06:52 PM   Modules accepted: Orders, Medications

## 2016-02-12 NOTE — Patient Instructions (Signed)
It was a pleasure seeing you today in our clinic. Today we discussed your flank pain and vertigo. Here is the treatment plan we have discussed and agreed upon together:   - I like for you to schedule your Midodrine twice a day. - Please contact your nephrologist to be seen in his office to discuss the possibility of limiting the amount of fluid that is pulled off each time you undergo hemodialysis. - I have ordered a bone density scan to be taken. - I would like to test her urine for any evidence of infection today in clinic. I will contact you with any results suggestive of a urinary tract infection. - I would like to see you back in 4 weeks.

## 2016-02-12 NOTE — Assessment & Plan Note (Signed)
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 Patient is at high risk for osteopenia/osteoporosis - We will obtain DEXA scan

## 2016-02-12 NOTE — Progress Notes (Signed)
HPI  CC: Rt sided pain, and falls.  Been ongoing for months. Gradually worsening. CVA/rib area. Nothing makes better/worse. Deep breathing and twisting does not make pain worse. Sleeping on the effected side does not make worse. No dysuria/hematuria.   ADDENDUM: UA was obtained on patient secondary to her right-sided flank pain. Urine specimen had gross hematuria present.  Falls: - hasn't fallen in 2 weeks. Most falls seem to be low velocity 2/2 instability/weakness. Endorses vertigo daily. Sitting makes this better. Fast movements make this worse. Describes "vertigo" as "lightheaded and dizzy". No feelings of the room spinning. More foggy/lightheadedness. Recently provided Midodrine. Patient states that she does not take this regularly however it does help.  Review of Systems   See HPI for ROS. All other systems reviewed and are negative.  CC, SH/smoking status, and VS noted  Objective: BP 100/50 mmHg  Pulse 78  Temp(Src) 98.3 F (36.8 C) (Oral)  Ht 5' (1.524 m)  Wt 164 lb (74.39 kg)  BMI 32.03 kg/m2  SpO2 97% Gen: NAD, alert, cooperative, and pleasant. HEENT: NCAT, EOMI, PERRL, no nystagmus, MMM. Dix-Hallpike negative CV: RRR Resp: CTAB, no wheezes, non-labored, prolonged expiratory phase Abd: SNTND, BS present, no guarding or organomegaly, substantial thoracic wall/CVA tenderness on right flank. No evidence of rash. Ext: No edema, warm Neuro: Alert and oriented, Speech clear, No gross deficits  Assessment and plan:  Vertigo Patient is here with complaints of vertigo over the past 2 months. Etiology currently unknown. Differential includes hypotension, BPPV (Dix-Hallpike negative), sequela of patient with history of stroke, cranial nerve dysfunction secondary to uncontrolled diabetes, heart arrhythmia, idiopathic. Patient states that her symptoms are improved with midodrine. - I've asked patient to schedule her midodrine and take as prescribed (BID). - I have asked that  patient discuss these symptoms with her nephrologist as I feel the most likely reversible cause is from patient's hypotension secondary to hemodialysis and fluid status. Patient stated her understanding. - We'll continue to follow/monitor.  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 Patient is at high risk for osteopenia/osteoporosis - We will obtain DEXA scan  Flank pain Patient has had ongoing and progressively worsening flank pain for the past 2 months. Etiology currently unknown. There appears to be some association with a fall which she experienced on the same side, right. Physical exam yielded significant tenderness to palpation with light touch along the inferior border of the right thoracic cage. No evidence of rash or bony deformity. No history of fevers, chills, nausea, vomiting, diarrhea. No prior history of renal stones. Patient is in end-stage renal patient however she states she still makes urine. She denies any dysuria or hematuria. However, UA was obtained in clinic today and specimen was grossly hematuric. - UA obtained. Grossly hematuric. Unable to read urine dipstick secondary to hematuria. - CT abdomen/pelvis without contrast. Discussed with radiology for most appropriate imaging to assess in this patient. She recommended CT without contrast at this time with possible additional studies dependent on initial findings. - We'll closely monitor. - I will contact patient with results of CT and further instructions at that time.    Orders Placed This Encounter  Procedures  . DG Bone Density    Standing Status: Future     Number of Occurrences:      Standing Expiration Date: 04/13/2017    Order Specific Question:  Reason for Exam (SYMPTOM  OR DIAGNOSIS REQUIRED)    Answer:  secondary hyperparathyroid    Order Specific  Question:  Preferred imaging location?    Answer:  [facility]  . DG Ribs Unilateral Right     Standing Status: Future     Number of Occurrences:      Standing Expiration Date: 04/13/2017    Order Specific Question:  Reason for Exam (SYMPTOM  OR DIAGNOSIS REQUIRED)    Answer:  rib/flank pain    Order Specific Question:  Preferred imaging location?    Answer:  [facility]  . CT Abdomen Pelvis Wo Contrast    Standing Status: Future     Number of Occurrences:      Standing Expiration Date: 05/14/2017    Order Specific Question:  Reason for Exam (SYMPTOM  OR DIAGNOSIS REQUIRED)    Answer:  gross hematuria    Order Specific Question:  Preferred imaging location?    Answer:  [facility]  . POCT urinalysis dipstick     Elberta Leatherwood, MD,MS,  PGY2 02/12/2016 6:28 PM

## 2016-02-12 NOTE — Assessment & Plan Note (Signed)
Patient has had ongoing and progressively worsening flank pain for the past 2 months. Etiology currently unknown. There appears to be some association with a fall which she experienced on the same side, right. Physical exam yielded significant tenderness to palpation with light touch along the inferior border of the right thoracic cage. No evidence of rash or bony deformity. No history of fevers, chills, nausea, vomiting, diarrhea. No prior history of renal stones. Patient is in end-stage renal patient however she states she still makes urine. She denies any dysuria or hematuria. However, UA was obtained in clinic today and specimen was grossly hematuric. - UA obtained. Grossly hematuric. Unable to read urine dipstick secondary to hematuria. - CT abdomen/pelvis without contrast. Discussed with radiology for most appropriate imaging to assess in this patient. She recommended CT without contrast at this time with possible additional studies dependent on initial findings. - We'll closely monitor. - I will contact patient with results of CT and further instructions at that time.

## 2016-02-12 NOTE — Telephone Encounter (Signed)
Kristi [name] called and need the office notes from the last office visit faxed to them. They said that they need this so that the patient is Medicare compliant and don't loose any services. Please fax this to [place] at [phone]. jw

## 2016-02-13 ENCOUNTER — Ambulatory Visit (HOSPITAL_COMMUNITY)
Admission: RE | Admit: 2016-02-13 | Discharge: 2016-02-13 | Disposition: A | Discharge status: Home / Self Care | Payer: Commercial Managed Care - HMO | Source: Ambulatory Visit | Attending: Family Medicine | Admitting: Family Medicine

## 2016-02-13 ENCOUNTER — Encounter (HOSPITAL_COMMUNITY): Payer: Self-pay

## 2016-02-13 ENCOUNTER — Telehealth: Payer: Self-pay | Admitting: Family Medicine

## 2016-02-13 DIAGNOSIS — N3289 Other specified disorders of bladder: Secondary | ICD-10-CM | POA: Diagnosis not present

## 2016-02-13 DIAGNOSIS — R310 Gross hematuria: Secondary | ICD-10-CM

## 2016-02-13 DIAGNOSIS — M799 Soft tissue disorder, unspecified: Secondary | ICD-10-CM | POA: Diagnosis not present

## 2016-02-13 DIAGNOSIS — N812 Incomplete uterovaginal prolapse: Secondary | ICD-10-CM | POA: Diagnosis not present

## 2016-02-13 DIAGNOSIS — K623 Rectal prolapse: Secondary | ICD-10-CM | POA: Diagnosis not present

## 2016-02-13 NOTE — Telephone Encounter (Signed)
Called patient to discuss CT results. No additional treatment at this time. Discussed the need to talk to her nephrologist about her vertigo and falls. I have considered starting her on antibiotics for UTI but will hold off as she has not had any fevers and I am not currently confident in this diagnosis. Patient is feeling better today. Has not had anymore hematuria since yesterday.  I have asked that she make appt if pain worsens or persists to the end of the week.

## 2016-02-15 LAB — PROTIME-INR: INR: 2.8 — AB (ref 0.9–1.1)

## 2016-02-16 ENCOUNTER — Ambulatory Visit (INDEPENDENT_AMBULATORY_CARE_PROVIDER_SITE_OTHER): Payer: Commercial Managed Care - HMO | Admitting: Cardiovascular Disease

## 2016-02-20 NOTE — Telephone Encounter (Signed)
No number provided to call back. If Mariann Laster returns call please inform her that any record requests will need to be faxed to [place].

## 2016-02-22 NOTE — Telephone Encounter (Signed)
[employer] referral has been completed. Auth # [id number]

## 2016-02-22 NOTE — Telephone Encounter (Signed)
Kristi [name] from [facility] on [address] need to have a referral placed for patient to have a dialysis catheter removed.  Procedure will be done on Monday 3/20.  Dr. Otelia Santee will be performing the procedure.  NPI [id number] /[id number]- N18.6.

## 2016-02-26 ENCOUNTER — Telehealth: Payer: Self-pay | Admitting: Family Medicine

## 2016-02-26 NOTE — Telephone Encounter (Signed)
Need to fax last face to face office notes to [phone] attn: Mariann Laster.

## 2016-02-28 NOTE — Telephone Encounter (Signed)
Office note printed and faxed to [facility]. Jazmin Hartsell,CMA

## 2016-03-07 LAB — PROTIME-INR: INR: 2.4 — AB (ref 0.9–1.1)

## 2016-03-08 ENCOUNTER — Ambulatory Visit: Payer: Commercial Managed Care - HMO | Admitting: Family Medicine

## 2016-03-11 ENCOUNTER — Ambulatory Visit (INDEPENDENT_AMBULATORY_CARE_PROVIDER_SITE_OTHER): Payer: Commercial Managed Care - HMO | Admitting: Internal Medicine

## 2016-03-18 ENCOUNTER — Ambulatory Visit (INDEPENDENT_AMBULATORY_CARE_PROVIDER_SITE_OTHER): Payer: Commercial Managed Care - HMO | Admitting: Family Medicine

## 2016-03-18 ENCOUNTER — Encounter: Payer: Self-pay | Admitting: Family Medicine

## 2016-03-18 VITALS — BP 86/50 | HR 84 | Temp 98.3°F | Ht 60.0 in | Wt 164.3 lb

## 2016-03-18 DIAGNOSIS — N816 Rectocele: Secondary | ICD-10-CM | POA: Diagnosis not present

## 2016-03-18 DIAGNOSIS — N8110 Cystocele, unspecified: Secondary | ICD-10-CM

## 2016-03-18 NOTE — Progress Notes (Signed)
   HPI  CC: Abdominal and buttock pain Patient is here with complaints of worsening pain when she sits down. She states that she had had a partially prolapsed uterus in the past which had improved but is now worsening. Because of this she believes that she is now experiencing pain every time she sits down. She denies any additional issues at this time. No bleeding or evidence of infection. She denies any fevers, chills, nausea, vomiting, diarrhea, dysuria, body aches, or rash.  Review of Systems   See HPI for ROS. All other systems reviewed and are negative.  CC, SH/smoking status, and VS noted  Objective: BP 86/50 mmHg  Pulse 84  Temp(Src) 98.3 F (36.8 C) (Oral)  Ht 5' (1.524 m)  Wt 164 lb 4.8 oz (74.526 kg)  BMI 32.09 kg/m2 Gen: NAD, alert, cooperative, and pleasant. HEENT: NCAT, EOMI, PERRL CV: RRR Resp: CTAB, no wheezes Ext: No edema, warm  Neuro: Alert and oriented, Speech clear, No gross deficits  Assessment and plan:  Cystocele with rectocele Patient is presenting with discomfort likely secondary to cystocele/rectocele. - Referral to gynecology  Of note: Patient's recent CT showed bladder wall thickening. I discussed the possibility of referral to urology as well as gynecology. Patient had informed me that she would like to only see gynecology for now. I believe that if patient's cystocele is not the cause of the hematuria we witnessed at her previous visit then I will likely continue to insist that we make a referral to urology for further evaluation.    Orders Placed This Encounter  Procedures  . Ambulatory referral to Gynecology    Referral Priority:  Urgent    Referral Type:  Consultation    Referral Reason:  Specialty Services Required    Requested Specialty:  Gynecology    Number of Visits Requested:  1    Elberta Leatherwood, MD,MS,  PGY2 03/18/2016 6:27 PM

## 2016-03-18 NOTE — Assessment & Plan Note (Signed)
Patient is presenting with discomfort likely secondary to cystocele/rectocele. - Referral to gynecology  Of note: Patient's recent CT showed bladder wall thickening. I discussed the possibility of referral to urology as well as gynecology. Patient had informed me that she would like to only see gynecology for now. I believe that if patient's cystocele is not the cause of the hematuria we witnessed at her previous visit then I will likely continue to insist that we make a referral to urology for further evaluation.

## 2016-03-18 NOTE — Patient Instructions (Signed)
It was a pleasure seeing you today in our clinic. Today we discussed your buttock pain. Here is the treatment plan we have discussed and agreed upon together:  - I referred you to gynecology. - You have any worsening of symptoms or signs of infection please do not hesitate to contact me.

## 2016-04-01 ENCOUNTER — Other Ambulatory Visit: Payer: Self-pay | Admitting: Interventional Cardiology

## 2016-04-01 ENCOUNTER — Encounter (HOSPITAL_BASED_OUTPATIENT_CLINIC_OR_DEPARTMENT_OTHER): Payer: Commercial Managed Care - HMO

## 2016-04-04 LAB — PROTIME-INR: INR: 3.2 — AB (ref 0.9–1.1)

## 2016-04-05 ENCOUNTER — Ambulatory Visit (INDEPENDENT_AMBULATORY_CARE_PROVIDER_SITE_OTHER): Payer: Commercial Managed Care - HMO | Admitting: Internal Medicine

## 2016-04-12 ENCOUNTER — Ambulatory Visit (INDEPENDENT_AMBULATORY_CARE_PROVIDER_SITE_OTHER): Payer: Commercial Managed Care - HMO | Admitting: Gynecology

## 2016-04-12 ENCOUNTER — Encounter: Payer: Self-pay | Admitting: Gynecology

## 2016-04-12 VITALS — BP 126/76 | Ht 60.0 in | Wt 161.0 lb

## 2016-04-12 DIAGNOSIS — N813 Complete uterovaginal prolapse: Secondary | ICD-10-CM

## 2016-04-12 NOTE — Progress Notes (Signed)
    Kristi [name] 04-02-1941 [id number]        75 y.o.  G3P3 new patient who presents in consultation due to something coming out of her vagina. Patient notes that for a long time she's noticed some obtrusion from her vagina but things got a lot worse several months ago where now it completely hangs out causing her a lot of discomfort. Also notes some bleeding associated with this.  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  Past medical history,surgical history, problem list, medications, allergies, family history and social history were all reviewed and documented in the EPIC chart.  Directed ROS with pertinent positives and negatives documented in the history of present illness/assessment and plan.  Exam: [name] assistant Filed Vitals:   04/12/16 1502  BP: 126/76  Height: 5' (1.524 m)  Weight: 161 lb (73.029 kg)   General appearance:  Fragile female Abdomen obese without gross masses or tenderness. 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 There is some mucoid blood on her perineum but no site of active bleeding. Bimanual exam limited by abdominal girth but no gross masses or tenderness.  Patients procidentia was reduced and a Gellhorn #3 pessary which is the largest we have to fit was placed and appeared to stay in place. Patient sat and subsequent only ambulated with it remaining in place. She notes significant relief of her discomfort.  Assessment/Plan:  75 y.o. G3P3 with total procidentia.at this point remains in place with Gellhorn pessary. Given the significant discomfort she was having and the significant relief she feels now we left the fitting pessary in place informing her that is  not to be worn long-term and that we will order her a pessary and replace it next week and she agrees to return. We'll also obtain a larger size pessary fitters in the event that this pessary spontaneously extrudes will be able to fit her for a 3-1/4, 3-1/2 possibility.  As far as the bleeding I reviewed this with her and will monitor at present because I'm not convinced it is actually vaginal may also have been rectal but given she is on Coumadin and the number of other comorbidities I don't think a more aggressive evaluation at this point is warranted.    Anastasio Auerbach MD, 3:50 PM 04/12/2016

## 2016-04-12 NOTE — Patient Instructions (Signed)
Follow up next week for pessary replacement.

## 2016-04-15 ENCOUNTER — Telehealth: Payer: Self-pay | Admitting: Family Medicine

## 2016-04-15 ENCOUNTER — Other Ambulatory Visit: Payer: Self-pay | Admitting: Family Medicine

## 2016-04-15 DIAGNOSIS — M545 Low back pain: Secondary | ICD-10-CM

## 2016-04-15 NOTE — Telephone Encounter (Signed)
Sent!

## 2016-04-15 NOTE — Telephone Encounter (Signed)
Pt is calling because she needs a new referral to be seen at [facility] She will be seeing Dr. Nelva Bush. jw

## 2016-04-18 LAB — PROTIME-INR: INR: 1.8 — AB (ref 0.9–1.1)

## 2016-04-18 NOTE — Addendum Note (Signed)
Addended by: Anastasio Auerbach on: 04/18/2016 07:51 AM   Modules accepted: Level of Service

## 2016-04-19 ENCOUNTER — Telehealth: Payer: Self-pay | Admitting: *Deleted

## 2016-04-19 ENCOUNTER — Ambulatory Visit (INDEPENDENT_AMBULATORY_CARE_PROVIDER_SITE_OTHER): Payer: Commercial Managed Care - HMO | Admitting: Ophthalmology

## 2016-04-19 DIAGNOSIS — E11319 Type 2 diabetes mellitus with unspecified diabetic retinopathy without macular edema: Secondary | ICD-10-CM

## 2016-04-19 NOTE — Telephone Encounter (Signed)
Patient needs a referral to Dr. Rodman Key for her eye problems.  States that she has already been established with this office but needs a new referral for her appt on 05/01/16.  Will forward to MD to place referral. Johnney Ou

## 2016-04-22 ENCOUNTER — Encounter: Payer: Self-pay | Admitting: Gynecology

## 2016-04-22 ENCOUNTER — Telehealth: Payer: Self-pay | Admitting: *Deleted

## 2016-04-22 ENCOUNTER — Ambulatory Visit (INDEPENDENT_AMBULATORY_CARE_PROVIDER_SITE_OTHER): Payer: Commercial Managed Care - HMO | Admitting: Gynecology

## 2016-04-22 VITALS — BP 124/76

## 2016-04-22 DIAGNOSIS — N813 Complete uterovaginal prolapse: Secondary | ICD-10-CM

## 2016-04-22 NOTE — Progress Notes (Signed)
    Kristi [name] 01/28/1941 [id number]        75 y.o.  G3P3 presents having had a Gellhorn 3.0 pessary placed for total procidentia 04/12/2016. Unfortunately it spontaneously extruded within a day.  Past medical history,surgical history, problem list, medications, allergies, family history and social history were all reviewed and documented in the EPIC chart.  Directed ROS with pertinent positives and negatives documented in the history of present illness/assessment and plan.  Exam: Kristi [name] assistant Filed Vitals:   04/22/16 1224  BP: 124/76   General appearance:  Fragile unable to lay flat Abdomen obese without gross masses or tenderness Pelvic external BUS vagina with total procidentia. Bimanual without gross masses. Mucoid blood noted on glove. Gelhorn 3.5 pessary placed.   Assessment/Plan:  75 y.o. G3P3 with total procidentia. Gelhorn 3.5 placed. We'll also order a ring 4.0 to have available in case this extrudes. I reviewed with her again the bleeding without gross evidence of lesions. At this point will not aggressively evaluate until we can have her procidentia reduced longer term.  The issues with her multiple medical problems as how aggressive as far as evaluating the bleeding as what intervention would be provided regardless of finding. Patient will follow up in 2 weeks for reexamination. Sooner if any issues.    Anastasio Auerbach MD, 12:43 PM 04/22/2016

## 2016-04-22 NOTE — Telephone Encounter (Signed)
Pt informed with the below note. 

## 2016-04-22 NOTE — Patient Instructions (Signed)
Follow up in 2 weeks for reexamination. Call sooner if the pessary comes out

## 2016-04-22 NOTE — Telephone Encounter (Signed)
Kim ordered a bigger size already so when it comes and will call and have her come in to try that one. Should not be more than couple days

## 2016-04-22 NOTE — Telephone Encounter (Signed)
Pt was seen today for pessary placed, daughter called and said pessary fallen out again. Pt has 2 week pessary check scheduled. Please advise

## 2016-04-23 ENCOUNTER — Ambulatory Visit (INDEPENDENT_AMBULATORY_CARE_PROVIDER_SITE_OTHER): Payer: Commercial Managed Care - HMO | Admitting: Cardiology

## 2016-04-23 NOTE — Telephone Encounter (Signed)
Referral placed.  Algis Greenhouse. Jerline Pain, [place] Resident PGY-2 04/23/2016 8:39 AM

## 2016-04-30 ENCOUNTER — Telehealth: Payer: Self-pay | Admitting: *Deleted

## 2016-04-30 NOTE — Telephone Encounter (Signed)
Pt called to see if pessary had arrived which it has I left on pt voicemail that TF is out all week and to keep schedule OV on 05/07/16.

## 2016-05-01 ENCOUNTER — Ambulatory Visit (INDEPENDENT_AMBULATORY_CARE_PROVIDER_SITE_OTHER): Payer: Commercial Managed Care - HMO | Admitting: Ophthalmology

## 2016-05-01 DIAGNOSIS — E11319 Type 2 diabetes mellitus with unspecified diabetic retinopathy without macular edema: Secondary | ICD-10-CM | POA: Diagnosis not present

## 2016-05-01 DIAGNOSIS — H35033 Hypertensive retinopathy, bilateral: Secondary | ICD-10-CM | POA: Diagnosis not present

## 2016-05-01 DIAGNOSIS — I10 Essential (primary) hypertension: Secondary | ICD-10-CM

## 2016-05-01 DIAGNOSIS — E113391 Type 2 diabetes mellitus with moderate nonproliferative diabetic retinopathy without macular edema, right eye: Secondary | ICD-10-CM | POA: Diagnosis not present

## 2016-05-01 DIAGNOSIS — H43813 Vitreous degeneration, bilateral: Secondary | ICD-10-CM | POA: Diagnosis not present

## 2016-05-01 DIAGNOSIS — E113592 Type 2 diabetes mellitus with proliferative diabetic retinopathy without macular edema, left eye: Secondary | ICD-10-CM | POA: Diagnosis not present

## 2016-05-07 ENCOUNTER — Ambulatory Visit: Payer: Commercial Managed Care - HMO | Admitting: Gynecology

## 2016-05-10 ENCOUNTER — Ambulatory Visit: Payer: Commercial Managed Care - HMO | Admitting: Family Medicine

## 2016-05-10 ENCOUNTER — Ambulatory Visit (INDEPENDENT_AMBULATORY_CARE_PROVIDER_SITE_OTHER): Payer: Commercial Managed Care - HMO | Admitting: Family Medicine

## 2016-05-10 ENCOUNTER — Encounter: Payer: Self-pay | Admitting: Family Medicine

## 2016-05-10 VITALS — BP 130/82 | HR 85 | Temp 97.7°F | Ht 60.0 in | Wt 163.2 lb

## 2016-05-10 DIAGNOSIS — N186 End stage renal disease: Secondary | ICD-10-CM

## 2016-05-10 DIAGNOSIS — Z9189 Other specified personal risk factors, not elsewhere classified: Secondary | ICD-10-CM

## 2016-05-10 DIAGNOSIS — M810 Age-related osteoporosis without current pathological fracture: Secondary | ICD-10-CM

## 2016-05-10 DIAGNOSIS — Z992 Dependence on renal dialysis: Secondary | ICD-10-CM

## 2016-05-10 DIAGNOSIS — N8110 Cystocele, unspecified: Secondary | ICD-10-CM | POA: Diagnosis not present

## 2016-05-10 DIAGNOSIS — N816 Rectocele: Secondary | ICD-10-CM

## 2016-05-10 NOTE — Assessment & Plan Note (Signed)
Currently being followed by gynecology. She is in between stages of upper sizing of the pessary device. Patient is also having additional hematuria which had been noted at a previous visit in our clinic. According to patient, her gynecologist believes this is likely secondary to her cystocele. I have asked patient to further inquire about this issue to her gynecologist at her appointment this coming Monday.

## 2016-05-10 NOTE — Assessment & Plan Note (Addendum)
Still on hemodialysis. Blood pressures are significantly improved from previous visit. We discussed her current living situations and she agreed that she likely needs some help with ADLs. She says that she had been thinking about moving to [place] which is where her daughter currently lives. I would like for her to continue thinking about plans for help around the house as I would be happy to get her some resources. - Patient is to think about what she wants regarding her long-term care and independence. - If patient is to decide to move to [place] she will need help finding a new hemodialysis home.

## 2016-05-10 NOTE — Assessment & Plan Note (Signed)
Patient is complaining of back pain and is at significant high risk for osteoporosis. Diagnosis of osteoporosis has a 30 been placed in her problem list however I been unable to locate any previous DEXA scan. Patient is currently being followed by orthopedics and are under management/care for this issue. - DEXA scan order placed and patient was informed of the number location she needs to call to set up this appointment. - My hope is that patient will also obtain the rib imaging that had been ordered at a recent visit and never obtained.

## 2016-05-10 NOTE — Patient Instructions (Signed)
It was a pleasure seeing you today in our clinic. Today we discussed your back pain. Here is the treatment plan we have discussed and agreed upon together:   - I have placed a order for a DEXA scan to be obtained. Please call the number on the sheet that I provided to schedule an appointment. - If you're found to have significant osteoporosis then there may be some medications we can provide that may help with some of this back pain. - Please discuss with your daughter the possibility of finding some help around the house. Whether that mean moving out to [place] with family or working with me to set up some help for you at home.

## 2016-05-10 NOTE — Progress Notes (Signed)
HPI  CC: Medical issues Patient is here with complaints of back pain. She states that this is the same pain she has been experiencing over the past few months. She has had no significant relief. She is seen by orthopedics and has been scheduled for an injection into her back which she hopes will provide adequate relief. She denies any red flag symptoms at this time.  Patient would also like to discuss her cystocele/rectocele. She has been seen by gynecology for this issue and has been getting fitted for pessaries. Unfortunately she has had some difficulty with the sizing and has not found a past history of the appropriate size. She endorses continued pain from this area but no evidence of infection. She does state that she is hopeful for adequate pain relief when she is able to be properly fitted.  Patient I also discussed her end-stage kidney disease. She endorses significant fatigue after dialysis. She understands that this is a common side effect of this procedure and does not expect anything to be done about this.  Patient and I also discussed her life at home. She currently is completely independent. We discussed the fact that she may have to consider seeking some help at home. She states her understanding for this and states that she has considered moving to [place] where her daughter currently resides. No significant plans have been made to date.  ROS: She denies fever, chills, confusion, falls, headache, shortness of breath, chest pain, abdominal pain, nausea, vomiting, diarrhea, leg swelling. Patient endorses some hematuria similar to that which was experienced at a previous visit in our office.  Review of Systems   See HPI for ROS. All other systems reviewed and are negative.  CC, SH/smoking status, and VS noted  Objective: BP 130/82 mmHg  Pulse 85  Temp(Src) 97.7 F (36.5 C) (Oral)  Ht 5' (1.524 m)  Wt 163 lb 3.2 oz (74.027 kg)  BMI 31.87 kg/m2 Gen: NAD, alert,  cooperative, and pleasant. HEENT: NCAT, EOMI, PERRL CV: RRR Resp: CTAB, no wheezes Ext: No edema, warm  Neuro: Alert and oriented, Speech clear, No gross deficits  Assessment and plan:  Osteoporosis Patient is complaining of back pain and is at significant high risk for osteoporosis. Diagnosis of osteoporosis has a 30 been placed in her problem list however I been unable to locate any previous DEXA scan. Patient is currently being followed by orthopedics and are under management/care for this issue. - DEXA scan order placed and patient was informed of the number location she needs to call to set up this appointment. - My hope is that patient will also obtain the rib imaging that had been ordered at a recent visit and never obtained.  Cystocele with rectocele Currently being followed by gynecology. She is in between stages of upper sizing of the pessary device. Patient is also having additional hematuria which had been noted at a previous visit in our clinic. According to patient, her gynecologist believes this is likely secondary to her cystocele. I have asked patient to further inquire about this issue to her gynecologist at her appointment this coming Monday.  ESRD on dialysis [facility]) Still on hemodialysis. Blood pressures are significantly improved from previous visit. We discussed her current living situations and she agreed that she likely needs some help with ADLs. She says that she had been thinking about moving to [place] which is where her daughter currently lives. I would like for her to continue thinking about plans for help  around the house as I would be happy to get her some resources. - Patient is to think about what she wants regarding her long-term care and independence. - If patient is to decide to move to [place] she will need help finding a new hemodialysis home.    Orders Placed This Encounter  Procedures  . DG Bone Density    Standing Status: Future      Number of Occurrences:      Standing Expiration Date: 07/10/2017    Order Specific Question:  Reason for Exam (SYMPTOM  OR DIAGNOSIS REQUIRED)    Answer:  high risk (hemodialysis patient)    Order Specific Question:  Preferred imaging location?    Answer:  [facility]    Elberta Leatherwood, MD,MS,  PGY2 05/10/2016 7:48 PM

## 2016-05-13 ENCOUNTER — Telehealth: Payer: Self-pay | Admitting: *Deleted

## 2016-05-13 ENCOUNTER — Encounter: Payer: Self-pay | Admitting: Gynecology

## 2016-05-13 ENCOUNTER — Ambulatory Visit (INDEPENDENT_AMBULATORY_CARE_PROVIDER_SITE_OTHER): Payer: Commercial Managed Care - HMO | Admitting: Gynecology

## 2016-05-13 VITALS — BP 122/84

## 2016-05-13 DIAGNOSIS — N813 Complete uterovaginal prolapse: Secondary | ICD-10-CM

## 2016-05-13 NOTE — Patient Instructions (Signed)
Assuming the pessary remains in place follow up in one month.

## 2016-05-13 NOTE — Progress Notes (Signed)
    CURRAN POTENZA 1941-10-09 [id number]        75 y.o.  G3P3 Presents with a history of total procidentia. Recently had a Gelhorn 3.5 pessary placed. Unfortunately it extruded the following day. She now presents for trial of a 4.0 ring with support.  Past medical history,surgical history, problem list, medications, allergies, family history and social history were all reviewed and documented in the EPIC chart.  Directed ROS with pertinent positives and negatives documented in the history of present illness/assessment and plan.  Exam: [name] assistant Filed Vitals:   05/13/16 1039  BP: 122/84   General appearance:  Frail female unable to lay flat due to respiratory compromise External BUS vagina with total procidentia. No active bleeding noted. Bimanual exam without gross masses or lesions.  Ring pessary with support #4 placed. With patient sitting up pessary extruded. It was replaced again and again it extruded. Subsequently a donut pessary was tried but she extruded this also. Ultimately a cube #4 pessary was placed and remained in place with ambulation. We had a real pessary #4 cube on site and we placed this for her to have.  Assessment/Plan:  75 y.o. G3P3  With total procidentia. #4 cube pessary placed at least temporarily successfully.  Had been doing some bleeding previously without initial bleeding noted. After manipulation with the various pessary she did have some staining but no significant bleeding. Patient will follow up in one month assuming this pessary remains in place.    Anastasio Auerbach MD, 11:08 AM 05/13/2016

## 2016-05-13 NOTE — Telephone Encounter (Signed)
Pt was seen today for pessary placement, went home and pessary has fell out again. Pt said you told her the next step would surgery? Please advise

## 2016-05-14 LAB — PROTIME-INR: INR: 2.2 — AB (ref 0.9–1.1)

## 2016-05-14 NOTE — Telephone Encounter (Signed)
I would recommend appointment with [facility] urogynecology and reconstructive pelvic surgery department i.e. Dr. Hessie Dibble or Dr. Maryland Pink.  Reference total uterine prolapse with significant comorbidities possibly considering [place] operation.

## 2016-05-14 NOTE — Telephone Encounter (Signed)
Appointment on 07/03/16 @ 9:45am with Dr.Matthews notes faxed to [phone], her direct office # [phone].  Pt aware with time and date.

## 2016-05-17 ENCOUNTER — Ambulatory Visit (INDEPENDENT_AMBULATORY_CARE_PROVIDER_SITE_OTHER): Payer: Commercial Managed Care - HMO

## 2016-06-04 LAB — PROTIME-INR: INR: 1.9 — AB (ref 0.9–1.1)

## 2016-06-06 ENCOUNTER — Ambulatory Visit (INDEPENDENT_AMBULATORY_CARE_PROVIDER_SITE_OTHER): Payer: Commercial Managed Care - HMO | Admitting: Interventional Cardiology

## 2016-06-07 ENCOUNTER — Telehealth: Payer: Self-pay | Admitting: Family Medicine

## 2016-06-12 NOTE — Telephone Encounter (Signed)
Call patient's daughter. Had a long discussion about patient's status. It appears as though she is having a increasingly difficult time tolerating HD. Daughter is concerned that mother's quality of life is starting to be significantly affected by this and she is looking for information about options at this time. I informed her that I was aware of patient's difficulty tolerating the HD but was unaware that it had progressed to the point that it is now. I advised discussing with her mother some additional options which included but were not limited to discontinuation of HD and placement on comfort care/hospice care, evaluation for peritoneal HD, or continuing HD with possible placement into a skilled nursing facility.  Patient's daughter had no additional questions and was thankful for the information.

## 2016-06-12 NOTE — Telephone Encounter (Signed)
Daughter Marcie Bal would like to speak with Dr. Alease Frame.  States that mom is extremely confused after dialysis treatments.  She would like to know what they should do about possibly getting hospice care.  Patient is too weak to take of herself anymore, she is deficating on herself at times.  Would like to know what their next steps needs to be.  They are expecting a call from the social worker at dialysis later this week.  Jazmin Hartsell,CMA

## 2016-06-13 ENCOUNTER — Encounter (HOSPITAL_COMMUNITY): Payer: Self-pay

## 2016-06-13 ENCOUNTER — Telehealth: Payer: Self-pay | Admitting: Family Medicine

## 2016-06-13 ENCOUNTER — Emergency Department (HOSPITAL_COMMUNITY): Payer: PRIVATE HEALTH INSURANCE

## 2016-06-13 ENCOUNTER — Encounter: Payer: Self-pay | Admitting: Family Medicine

## 2016-06-13 ENCOUNTER — Inpatient Hospital Stay (HOSPITAL_COMMUNITY)
Admission: EM | Admit: 2016-06-13 | Discharge: 2016-06-15 | DRG: 291 | Disposition: A | Discharge status: Hospice | Payer: PRIVATE HEALTH INSURANCE | Attending: Family Medicine | Admitting: Family Medicine

## 2016-06-13 DIAGNOSIS — I272 Other secondary pulmonary hypertension: Secondary | ICD-10-CM | POA: Diagnosis not present

## 2016-06-13 DIAGNOSIS — I252 Old myocardial infarction: Secondary | ICD-10-CM

## 2016-06-13 DIAGNOSIS — I132 Hypertensive heart and chronic kidney disease with heart failure and with stage 5 chronic kidney disease, or end stage renal disease: Principal | ICD-10-CM | POA: Diagnosis present

## 2016-06-13 DIAGNOSIS — E785 Hyperlipidemia, unspecified: Secondary | ICD-10-CM | POA: Diagnosis present

## 2016-06-13 DIAGNOSIS — R109 Unspecified abdominal pain: Secondary | ICD-10-CM

## 2016-06-13 DIAGNOSIS — Z66 Do not resuscitate: Secondary | ICD-10-CM | POA: Diagnosis not present

## 2016-06-13 DIAGNOSIS — R4182 Altered mental status, unspecified: Secondary | ICD-10-CM | POA: Diagnosis present

## 2016-06-13 DIAGNOSIS — N186 End stage renal disease: Secondary | ICD-10-CM | POA: Diagnosis present

## 2016-06-13 DIAGNOSIS — Z8672 Personal history of thrombophlebitis: Secondary | ICD-10-CM

## 2016-06-13 DIAGNOSIS — E113599 Type 2 diabetes mellitus with proliferative diabetic retinopathy without macular edema, unspecified eye: Secondary | ICD-10-CM | POA: Diagnosis not present

## 2016-06-13 DIAGNOSIS — K219 Gastro-esophageal reflux disease without esophagitis: Secondary | ICD-10-CM | POA: Diagnosis present

## 2016-06-13 DIAGNOSIS — G9340 Encephalopathy, unspecified: Secondary | ICD-10-CM | POA: Diagnosis not present

## 2016-06-13 DIAGNOSIS — D472 Monoclonal gammopathy: Secondary | ICD-10-CM | POA: Diagnosis present

## 2016-06-13 DIAGNOSIS — Z888 Allergy status to other drugs, medicaments and biological substances status: Secondary | ICD-10-CM

## 2016-06-13 DIAGNOSIS — Z992 Dependence on renal dialysis: Secondary | ICD-10-CM

## 2016-06-13 DIAGNOSIS — I5022 Chronic systolic (congestive) heart failure: Secondary | ICD-10-CM | POA: Diagnosis present

## 2016-06-13 DIAGNOSIS — I2510 Atherosclerotic heart disease of native coronary artery without angina pectoris: Secondary | ICD-10-CM | POA: Diagnosis present

## 2016-06-13 DIAGNOSIS — I480 Paroxysmal atrial fibrillation: Secondary | ICD-10-CM | POA: Diagnosis not present

## 2016-06-13 DIAGNOSIS — E1122 Type 2 diabetes mellitus with diabetic chronic kidney disease: Secondary | ICD-10-CM | POA: Diagnosis not present

## 2016-06-13 DIAGNOSIS — Z7901 Long term (current) use of anticoagulants: Secondary | ICD-10-CM

## 2016-06-13 DIAGNOSIS — E875 Hyperkalemia: Secondary | ICD-10-CM | POA: Diagnosis not present

## 2016-06-13 DIAGNOSIS — Z79899 Other long term (current) drug therapy: Secondary | ICD-10-CM

## 2016-06-13 DIAGNOSIS — I69354 Hemiplegia and hemiparesis following cerebral infarction affecting left non-dominant side: Secondary | ICD-10-CM

## 2016-06-13 DIAGNOSIS — N2581 Secondary hyperparathyroidism of renal origin: Secondary | ICD-10-CM | POA: Diagnosis not present

## 2016-06-13 DIAGNOSIS — R531 Weakness: Secondary | ICD-10-CM

## 2016-06-13 DIAGNOSIS — Z794 Long term (current) use of insulin: Secondary | ICD-10-CM

## 2016-06-13 DIAGNOSIS — J449 Chronic obstructive pulmonary disease, unspecified: Secondary | ICD-10-CM | POA: Diagnosis present

## 2016-06-13 DIAGNOSIS — Z87891 Personal history of nicotine dependence: Secondary | ICD-10-CM

## 2016-06-13 DIAGNOSIS — Z955 Presence of coronary angioplasty implant and graft: Secondary | ICD-10-CM

## 2016-06-13 DIAGNOSIS — M109 Gout, unspecified: Secondary | ICD-10-CM | POA: Diagnosis not present

## 2016-06-13 LAB — URINE MICROSCOPIC-ADD ON

## 2016-06-13 LAB — COMPREHENSIVE METABOLIC PANEL
ALBUMIN: 2.2 g/dL — AB (ref 3.5–5.0)
ALT: 11 U/L — ABNORMAL LOW (ref 14–54)
ANION GAP: 7 (ref 5–15)
AST: 13 U/L — ABNORMAL LOW (ref 15–41)
Alkaline Phosphatase: 153 U/L — ABNORMAL HIGH (ref 38–126)
BILIRUBIN TOTAL: 0.3 mg/dL (ref 0.3–1.2)
BUN: 27 mg/dL — ABNORMAL HIGH (ref 6–20)
CO2: 29 mmol/L (ref 22–32)
Calcium: 9.1 mg/dL (ref 8.9–10.3)
Chloride: 98 mmol/L — ABNORMAL LOW (ref 101–111)
Creatinine, Ser: 5.57 mg/dL — ABNORMAL HIGH (ref 0.44–1.00)
GFR calc Af Amer: 8 mL/min — ABNORMAL LOW (ref >60)
GFR calc non Af Amer: 7 mL/min — ABNORMAL LOW (ref >60)
GLUCOSE: 131 mg/dL — AB (ref 65–99)
POTASSIUM: 5.4 mmol/L — AB (ref 3.5–5.1)
SODIUM: 134 mmol/L — AB (ref 135–145)
TOTAL PROTEIN: 5.6 g/dL — AB (ref 6.5–8.1)

## 2016-06-13 LAB — CBG MONITORING, ED: Glucose-Capillary: 143 mg/dL — ABNORMAL HIGH (ref 65–99)

## 2016-06-13 LAB — URINALYSIS, ROUTINE W REFLEX MICROSCOPIC
Bilirubin Urine: NEGATIVE
Glucose, UA: 100 mg/dL — AB
Ketones, ur: NEGATIVE mg/dL
Nitrite: NEGATIVE
Protein, ur: 100 mg/dL — AB
SPECIFIC GRAVITY, URINE: 1.014 (ref 1.005–1.030)
pH: 8 (ref 5.0–8.0)

## 2016-06-13 LAB — CBC
HEMATOCRIT: 34.1 % — AB (ref 36.0–46.0)
HEMOGLOBIN: 10.9 g/dL — AB (ref 12.0–15.0)
MCH: 33.1 pg (ref 26.0–34.0)
MCHC: 32 g/dL (ref 30.0–36.0)
MCV: 103.6 fL — ABNORMAL HIGH (ref 78.0–100.0)
Platelets: 111 10*3/uL — ABNORMAL LOW (ref 150–400)
RBC: 3.29 MIL/uL — ABNORMAL LOW (ref 3.87–5.11)
RDW: 17.7 % — ABNORMAL HIGH (ref 11.5–15.5)
WBC: 6.3 10*3/uL (ref 4.0–10.5)

## 2016-06-13 LAB — BRAIN NATRIURETIC PEPTIDE: B Natriuretic Peptide: 836.6 pg/mL — ABNORMAL HIGH (ref 0.0–100.0)

## 2016-06-13 LAB — I-STAT VENOUS BLOOD GAS, ED
Acid-Base Excess: 3 mmol/L — ABNORMAL HIGH (ref 0.0–2.0)
Bicarbonate: 28.1 mEq/L — ABNORMAL HIGH (ref 20.0–24.0)
O2 SAT: 33 %
PCO2 VEN: 43 mmHg — AB (ref 45.0–50.0)
PO2 VEN: 20 mmHg — AB (ref 31.0–45.0)
TCO2: 29 mmol/L (ref 0–100)
pH, Ven: 7.423 — ABNORMAL HIGH (ref 7.250–7.300)

## 2016-06-13 LAB — AMMONIA: Ammonia: 18 umol/L (ref 9–35)

## 2016-06-13 LAB — PROTIME-INR
INR: 1.18 (ref 0.00–1.49)
PROTHROMBIN TIME: 15.1 s (ref 11.6–15.2)

## 2016-06-13 LAB — PHOSPHORUS: PHOSPHORUS: 5.1 mg/dL — AB (ref 2.5–4.6)

## 2016-06-13 LAB — MAGNESIUM: MAGNESIUM: 2 mg/dL (ref 1.7–2.4)

## 2016-06-13 MED ORDER — PANTOPRAZOLE SODIUM 40 MG PO TBEC
40.0000 mg | DELAYED_RELEASE_TABLET | Freq: Every day | ORAL | Status: DC
  Administered 2016-06-13 – 2016-06-15 (×3): 40 mg via ORAL
  Filled 2016-06-13 (×3): qty 1

## 2016-06-13 MED ORDER — MIDODRINE HCL 5 MG PO TABS
10.0000 mg | ORAL_TABLET | Freq: Two times a day (BID) | ORAL | Status: DC
  Administered 2016-06-14 – 2016-06-15 (×3): 10 mg via ORAL
  Filled 2016-06-13 (×3): qty 2

## 2016-06-13 MED ORDER — ALLOPURINOL 100 MG PO TABS
300.0000 mg | ORAL_TABLET | Freq: Every day | ORAL | Status: DC
  Administered 2016-06-13: 300 mg via ORAL
  Filled 2016-06-13: qty 3

## 2016-06-13 MED ORDER — IPRATROPIUM BROMIDE 0.02 % IN SOLN
2.5000 mL | Freq: Every day | RESPIRATORY_TRACT | Status: DC
  Administered 2016-06-14: 0.5 mg via RESPIRATORY_TRACT
  Filled 2016-06-13: qty 2.5

## 2016-06-13 MED ORDER — ALBUTEROL SULFATE (2.5 MG/3ML) 0.083% IN NEBU: 3.0000 mL | INHALATION_SOLUTION | RESPIRATORY_TRACT | Status: DC | PRN

## 2016-06-13 MED ORDER — SODIUM CHLORIDE 0.9% FLUSH
3.0000 mL | Freq: Two times a day (BID) | INTRAVENOUS | Status: DC
  Administered 2016-06-13 – 2016-06-14 (×3): 3 mL via INTRAVENOUS

## 2016-06-13 MED ORDER — MEGESTROL ACETATE 20 MG PO TABS
20.0000 mg | ORAL_TABLET | Freq: Every day | ORAL | Status: DC
  Administered 2016-06-14: 20 mg via ORAL
  Filled 2016-06-13 (×2): qty 1

## 2016-06-13 MED ORDER — PRAVASTATIN SODIUM 40 MG PO TABS
40.0000 mg | ORAL_TABLET | Freq: Every day | ORAL | Status: DC
  Administered 2016-06-13 – 2016-06-15 (×3): 40 mg via ORAL
  Filled 2016-06-13 (×3): qty 1

## 2016-06-13 MED ORDER — FUROSEMIDE 10 MG/ML IJ SOLN
80.0000 mg | Freq: Once | INTRAMUSCULAR | Status: AC
  Administered 2016-06-13: 40 mg via INTRAVENOUS
  Filled 2016-06-13: qty 8

## 2016-06-13 MED ORDER — WARFARIN - PHARMACIST DOSING INPATIENT: Freq: Every day | Status: DC

## 2016-06-13 MED ORDER — LORATADINE 10 MG PO TABS
10.0000 mg | ORAL_TABLET | Freq: Every day | ORAL | Status: DC
  Administered 2016-06-13 – 2016-06-15 (×3): 10 mg via ORAL
  Filled 2016-06-13 (×3): qty 1

## 2016-06-13 MED ORDER — MOMETASONE FURO-FORMOTEROL FUM 100-5 MCG/ACT IN AERO
2.0000 | INHALATION_SPRAY | Freq: Two times a day (BID) | RESPIRATORY_TRACT | Status: DC
  Administered 2016-06-14 – 2016-06-15 (×3): 2 via RESPIRATORY_TRACT
  Filled 2016-06-13: qty 8.8

## 2016-06-13 MED ORDER — NITROGLYCERIN 0.4 MG SL SUBL: 0.4000 mg | SUBLINGUAL_TABLET | SUBLINGUAL | Status: DC | PRN

## 2016-06-13 MED ORDER — WARFARIN SODIUM 7.5 MG PO TABS
7.5000 mg | ORAL_TABLET | Freq: Once | ORAL | Status: AC
  Administered 2016-06-13: 7.5 mg via ORAL
  Filled 2016-06-13 (×2): qty 1

## 2016-06-13 MED ORDER — CALCIUM ACETATE (PHOS BINDER) 667 MG PO CAPS
1334.0000 mg | ORAL_CAPSULE | Freq: Three times a day (TID) | ORAL | Status: DC
  Administered 2016-06-15: 1334 mg via ORAL
  Filled 2016-06-13: qty 2

## 2016-06-13 NOTE — Telephone Encounter (Signed)
Patients daughter sent message over epic. I responded to that message and will stop by patient's room if she is to be admitted.

## 2016-06-13 NOTE — ED Notes (Signed)
Per EMS - daughter called EMS, states pt not acting herself. Pt did not go to dialysis today. Swelling noted to lower extremities. No shortness of breath. A&O x 4. Pt telling RN she did not go to dialysis because she did not want to today.

## 2016-06-13 NOTE — ED Notes (Signed)
When doing In and Out cath, white creamy vaginal discharge observed

## 2016-06-13 NOTE — Progress Notes (Signed)
ANTICOAGULATION CONSULT NOTE - Initial Consult  Pharmacy Consult for warfarin Indication: atrial fibrillation  Allergies  Allergen Reactions  . Losartan Other (See Comments)    Causes increase in creatinine and worsening CKD  . Chantix [Varenicline] Nausea Only and Other (See Comments)    Dizzy and GI symptoms     Patient Measurements:    Vital Signs: Temp: 98.4 F (36.9 C) (07/06 1513) BP: 149/73 mmHg (07/06 2000) Pulse Rate: 93 (07/06 2000)  Labs:  Recent Labs  06/13/16 1529 06/13/16 1712  HGB 10.9*  --   HCT 34.1*  --   PLT 111*  --   LABPROT  --  15.1  INR  --  1.18  CREATININE 5.57*  --     CrCl cannot be calculated (Unknown ideal weight.).   Medical History: Past Medical History  Diagnosis Date  . Hyperlipidemia   . Hyperparathyroidism   . CAD (coronary artery disease)   . Myocardial infarction ([place]) 2008  . Hypertension   . COPD (chronic obstructive pulmonary disease) ([place])   . Diabetes mellitus     diagnosed 2010 - takes oral meds and lantus insulin  . Anemia   . GERD (gastroesophageal reflux disease)     on omeprazole  . Arthritis     all over  . CHF (congestive heart failure) ([place]) 08-2011  . CVA 02/19/2010  . CAD 02/19/2010  . DEEP VENOUS THROMBOPHLEBITIS, LEG, LEFT 03/08/2010    in 2009 s/p hospitalization- only 1 isolated dvt  . Pulmonary artery hypertension ([place]) 10/31/2014  . Shortness of breath dyspnea     with exertion  . Depression     years ago  . Dry skin   . Headache   . Dysrhythmia     atrial fibrillation  . Chronic kidney disease     esrd - since 2010 - DIALYIS    Assessment: 14 yof presented to the Ed with weakness and confusion. She is on chronic coumadin for history of afib but INR is subtherapeutic. Pt does not know when her last dose was.   Goal of Therapy:  INR 2-3 Monitor platelets by anticoagulation protocol: Yes   Plan:  - Warfarin 7.5mg  PO x 1 tonight - Daily INR  Bobi Daudelin, Rande Lawman 06/13/2016,8:18  PM

## 2016-06-13 NOTE — ED Notes (Signed)
Admitting at bedside 

## 2016-06-13 NOTE — ED Notes (Signed)
In and out cath performed. Assisted by [facility] NT

## 2016-06-13 NOTE — Telephone Encounter (Signed)
Daughter spoke with Dr Alease Frame about her mom.  They had to call an ambulance today.  She is on her way now to [facility] ED for the issues discussed yesterday. She is requesting Dr Alease Frame to assess the pt for hospice care

## 2016-06-13 NOTE — ED Provider Notes (Signed)
CSN: [id number]     Arrival date & time 06/13/16  1456 History   First MD Initiated Contact with Patient 06/13/16 1522     Chief Complaint  Patient presents with  . Leg Pain  . Altered Mental Status     (Consider location/radiation/quality/duration/timing/severity/associated sxs/prior Treatment) Patient is a 75 y.o. female presenting with altered mental status. The history is provided by the EMS personnel and a relative.  Altered Mental Status Presenting symptoms: confusion and disorientation   Severity:  Severe Most recent episode:  Today Episode history:  Continuous Timing:  Constant Progression:  Worsening Chronicity:  New Context: not dementia and not head injury   Associated symptoms: no abdominal pain, no fever, no light-headedness, no nausea, no palpitations, no rash and no vomiting     Past Medical History  Diagnosis Date  . Hyperlipidemia   . Hyperparathyroidism   . CAD (coronary artery disease)   . Myocardial infarction ([place]) 2008  . Hypertension   . COPD (chronic obstructive pulmonary disease) ([place])   . Diabetes mellitus     diagnosed 2010 - takes oral meds and lantus insulin  . Anemia   . GERD (gastroesophageal reflux disease)     on omeprazole  . Arthritis     all over  . CHF (congestive heart failure) ([place]) 08-2011  . CVA 02/19/2010  . CAD 02/19/2010  . DEEP VENOUS THROMBOPHLEBITIS, LEG, LEFT 03/08/2010    in 2009 s/p hospitalization- only 1 isolated dvt  . Pulmonary artery hypertension ([place]) 10/31/2014  . Shortness of breath dyspnea     with exertion  . Depression     years ago  . Dry skin   . Headache   . Dysrhythmia     atrial fibrillation  . Chronic kidney disease     esrd - since feb  2016 - DIALYIS    Past Surgical History  Procedure Laterality Date  . Av fistula placement  04/05/11    Left Radiocephalic AVF  . Appendectomy  1969  . Tubal ligation    . Revision of fisturl    . Coronary angioplasty with stent placement  06/23/2007    by Dr.  Irish Lack is her cardiologist  . Ligation goretex fistula  10/29/2011    Procedure: LIGATION GORE-TEX FISTULA;  Surgeon: Elam Dutch, MD;  Location: [facility] OR;  Service: Vascular;  Laterality: Left;  Ligation of competing branches left forearm fistula  . Fistulogram N/A 02/17/2012    Procedure: FISTULOGRAM;  Surgeon: Angelia Mould, MD;  Location: [facility] CATH LAB;  Service: Cardiovascular;  Laterality: N/A;  . Insertion of dialysis catheter N/A 01/12/2015    Procedure: INSERTION OF DIALYSIS CATHETER;  Surgeon: Conrad Bunk Foss, MD;  Location: [place];  Service: Vascular;  Laterality: N/A;  . Esophagogastroduodenoscopy N/A 01/20/2015    Procedure: ESOPHAGOGASTRODUODENOSCOPY (EGD);  Surgeon: Wonda Horner, MD;  Location: [facility] ENDOSCOPY;  Service: Endoscopy;  Laterality: N/A;  . Eye surgery Bilateral     cataract surgery with lens implants  . Tonsillectomy    . Bascilic vein transposition Left 03/24/2015    Procedure: LEFT ARM BASILIC VEIN TRANSPOSITION;  Surgeon: Angelia Mould, MD;  Location: [place];  Service: Vascular;  Laterality: Left;  . Ligation of arteriovenous  fistula Left 03/24/2015    Procedure: LIGATION OF left arm radio-cephalic ARTERIOVENOUS  FISTULA;  Surgeon: Angelia Mould, MD;  Location: [place];  Service: Vascular;  Laterality: Left;  . Revison of arteriovenous fistula Left 08/11/2015    Procedure:  PLICATION OF BASILIC VEIN TRANSPOSITION FISTULA;  Surgeon: Angelia Mould, MD;  Location: [facility] OR;  Service: Vascular;  Laterality: Left;   Family History  Problem Relation Age of Onset  . Cancer Mother     multiple myloma  . Dementia Father   . Pneumonia Father   . Anuerysm Father    Social History  Substance Use Topics  . Smoking status: Former Smoker -- 0.50 packs/day for 53 years    Types: Cigarettes    Start date: 12/09/1960    Quit date: 03/31/2014  . Smokeless tobacco: Never Used  . Alcohol Use: 0.0 oz/week    0 Standard drinks or equivalent per week      Comment: occasional   OB History    Gravida Para Term Preterm AB TAB SAB Ectopic Multiple Living   3 3        2      Review of Systems  Constitutional: Negative for fever and chills.  HENT: Negative for congestion and sore throat.   Eyes: Negative for pain.  Respiratory: Negative for cough and shortness of breath.   Cardiovascular: Negative for chest pain and palpitations.  Gastrointestinal: Negative for nausea, vomiting, abdominal pain and diarrhea.  Genitourinary: Negative for dysuria and flank pain.  Musculoskeletal: Negative for back pain and neck pain.  Skin: Negative for rash.  Allergic/Immunologic: Negative.   Neurological: Negative for dizziness and light-headedness.  Psychiatric/Behavioral: Positive for confusion.      Allergies  Losartan and Chantix  Home Medications   Prior to Admission medications   Medication Sig Start Date End Date Taking? Authorizing Provider  acetaminophen (TYLENOL) 500 MG tablet Take 1,000 mg by mouth every 6 (six) hours as needed for moderate pain.    Yes Historical Provider, MD  albuterol (PROVENTIL HFA;VENTOLIN HFA) 108 (90 BASE) MCG/ACT inhaler Inhale into the lungs every 4 (four) hours as needed for wheezing.   Yes Historical Provider, MD  allopurinol (ZYLOPRIM) 300 MG tablet Take 300 mg by mouth daily. 05/21/16  Yes Historical Provider, MD  calcium acetate (PHOSLO) 667 MG capsule Take 1,334 mg by mouth 3 (three) times daily with meals.    Yes Historical Provider, MD  CINNAMON PO Take 1,000 mg by mouth daily.   Yes Historical Provider, MD  ferrous sulfate 325 (65 FE) MG tablet Take 325 mg by mouth daily.   Yes Historical Provider, MD  fexofenadine (ALLEGRA) 180 MG tablet Take 180 mg by mouth daily as needed for allergies or rhinitis.   Yes Historical Provider, MD  furosemide (LASIX) 80 MG tablet Take 80-240 mg by mouth daily. Takes 3 tabs on Sun, Mon, Wed and Fri  Takes 1 tab all other days   Yes Historical Provider, MD  Lancet Devices  (ACCU-CHEK [place]) lancets Use as instructed 05/24/15  Yes Elberta Leatherwood, MD  Lancets Misc. (ACCU-CHEK SOFTCLIX LANCET DEV) KIT 1 strip by Does not apply route 3 (three) times daily. 05/24/15  Yes Elberta Leatherwood, MD  megestrol (MEGACE) 20 MG tablet Take 20 mg by mouth daily. 05/24/16  Yes Historical Provider, MD  midodrine (PROAMATINE) 10 MG tablet Take 1 tablet (10 mg total) by mouth 2 (two) times daily with a meal. 12/28/15  Yes Carlyle Dolly, MD  mometasone-formoterol (DULERA) 100-5 MCG/ACT AERO Inhale 2 puffs into the lungs 2 (two) times daily as needed for shortness of breath.   Yes Historical Provider, MD  nitroGLYCERIN (NITROSTAT) 0.4 MG SL tablet Place 0.4 mg under the tongue every 5 (five)  minutes as needed. Reported on 04/12/2016   Yes Historical Provider, MD  omeprazole (PRILOSEC) 20 MG capsule Take 1 capsule (20 mg total) by mouth at bedtime. Patient taking differently: Take 20 mg by mouth daily as needed (only takes when eating late in the night).  06/13/15  Yes Elberta Leatherwood, MD  Polyethyl Glycol-Propyl Glycol (SYSTANE OP) Apply 1-2 drops to eye 4 (four) times daily as needed. For dry eyes   Yes Historical Provider, MD  pravastatin (PRAVACHOL) 40 MG tablet TAKE 1 TABLET BY MOUTH EVERY DAY 09/08/15  Yes Elberta Leatherwood, MD  Tiotropium Bromide Monohydrate 2.5 MCG/ACT AERS Inhale 2 puffs into the lungs daily. 10/06/15  Yes Zenia Resides, MD  vitamin B-12 (CYANOCOBALAMIN) 1000 MCG tablet Take 1,000 mcg by mouth every morning.    Yes Historical Provider, MD  warfarin (COUMADIN) 5 MG tablet TAKE AS DIRECTED BY COUMADIN CLINIC 04/01/16  Yes Jettie Booze, MD  warfarin (COUMADIN) 5 MG tablet Take 1 tablet (5 mg total) by mouth one time only at 6 PM. 12/28/15 01/02/16  Carlyle Dolly, MD   BP 153/54 mmHg  Pulse 68  Temp(Src) 98.2 F (36.8 C) (Oral)  Resp 18  Wt 73.6 kg  SpO2 95% Physical Exam  Constitutional: She appears well-developed and well-nourished. No distress.  HENT:  Head:  Normocephalic and atraumatic.  Eyes: Conjunctivae and EOM are normal. Pupils are equal, round, and reactive to light.  Neck: Normal range of motion. Neck supple.  Cardiovascular: Normal rate, regular rhythm and normal heart sounds.   Pulmonary/Chest: Effort normal and breath sounds normal. No respiratory distress.  Abdominal: Soft. Bowel sounds are normal. There is no tenderness.  Musculoskeletal: Normal range of motion.  Neurological: She is alert. She has normal strength and normal reflexes. No cranial nerve deficit or sensory deficit. GCS eye subscore is 4. GCS verbal subscore is 4. GCS motor subscore is 6.  Oriented to self only  Skin: Skin is warm and dry. She is not diaphoretic.  Psychiatric: She has a normal mood and affect.    ED Course  Procedures (including critical care time) Labs Review Labs Reviewed  COMPREHENSIVE METABOLIC PANEL - Abnormal; Notable for the following:    Sodium 134 (*)    Potassium 5.4 (*)    Chloride 98 (*)    Glucose, Bld 131 (*)    BUN 27 (*)    Creatinine, Ser 5.57 (*)    Total Protein 5.6 (*)    Albumin 2.2 (*)    AST 13 (*)    ALT 11 (*)    Alkaline Phosphatase 153 (*)    GFR calc non Af Amer 7 (*)    GFR calc Af Amer 8 (*)    All other components within normal limits  CBC - Abnormal; Notable for the following:    RBC 3.29 (*)    Hemoglobin 10.9 (*)    HCT 34.1 (*)    MCV 103.6 (*)    RDW 17.7 (*)    Platelets 111 (*)    All other components within normal limits  URINALYSIS, ROUTINE W REFLEX MICROSCOPIC (NOT AT [facility]) - Abnormal; Notable for the following:    APPearance TURBID (*)    Glucose, UA 100 (*)    Hgb urine dipstick LARGE (*)    Protein, ur 100 (*)    Leukocytes, UA LARGE (*)    All other components within normal limits  PHOSPHORUS - Abnormal; Notable for the following:  Phosphorus 5.1 (*)    All other components within normal limits  BRAIN NATRIURETIC PEPTIDE - Abnormal; Notable for the following:    B Natriuretic  Peptide 836.6 (*)    All other components within normal limits  URINE MICROSCOPIC-ADD ON - Abnormal; Notable for the following:    Squamous Epithelial / LPF 0-5 (*)    Bacteria, UA FEW (*)    All other components within normal limits  COMPREHENSIVE METABOLIC PANEL - Abnormal; Notable for the following:    Sodium 133 (*)    Potassium 5.9 (*)    BUN 31 (*)    Creatinine, Ser 6.10 (*)    Calcium 8.4 (*)    Total Protein 4.6 (*)    Albumin 1.7 (*)    AST 12 (*)    ALT 8 (*)    GFR calc non Af Amer 6 (*)    GFR calc Af Amer 7 (*)    All other components within normal limits  CBC - Abnormal; Notable for the following:    RBC 2.80 (*)    Hemoglobin 9.5 (*)    HCT 29.0 (*)    MCV 103.6 (*)    RDW 17.7 (*)    Platelets 100 (*)    All other components within normal limits  CBG MONITORING, ED - Abnormal; Notable for the following:    Glucose-Capillary 143 (*)    All other components within normal limits  I-STAT VENOUS BLOOD GAS, ED - Abnormal; Notable for the following:    pH, Ven 7.423 (*)    pCO2, Ven 43.0 (*)    pO2, Ven 20.0 (*)    Bicarbonate 28.1 (*)    Acid-Base Excess 3.0 (*)    All other components within normal limits  URINE CULTURE  MRSA PCR SCREENING  MAGNESIUM  AMMONIA  PROTIME-INR  PROTIME-INR  TSH  VITAMIN B12  BLOOD GAS, VENOUS  RPR    Imaging Review Ct Head Wo Contrast  06/13/2016  CLINICAL DATA:  Altered mental status EXAM: CT HEAD WITHOUT CONTRAST TECHNIQUE: Contiguous axial images were obtained from the base of the skull through the vertex without intravenous contrast. COMPARISON:  12/24/2015 FINDINGS: No skull fracture is noted. Paranasal sinuses and mastoid air cells are unremarkable. Atherosclerotic calcifications of carotid siphon are noted. Atherosclerotic calcifications of vertebral arteries are noted. No intracranial hemorrhage, mass effect or midline shift. No acute cortical infarction. Stable atrophy and chronic white matter disease. Stable remote  infarct left posterior parietal lobe. Stable remote infarct in left occipital lobe. No mass lesion is noted on this unenhanced scan. IMPRESSION: No acute intracranial abnormality. Stable old infarct in left occipital and left posterior parietal lobe. No definite acute cortical infarction. Stable atrophy and chronic white matter disease. Electronically Signed   By: Lahoma Crocker M.D.   On: 06/13/2016 16:25   Dg Chest Portable 1 View  06/13/2016  CLINICAL DATA:  Altered mental status EXAM: PORTABLE CHEST 1 VIEW COMPARISON:  12/24/2015 chest radiograph. FINDINGS: Stable cardiomediastinal silhouette with top-normal heart size. No pneumothorax. No pleural effusion. No overt pulmonary edema. No acute consolidative airspace disease. IMPRESSION: No active disease. Electronically Signed   By: Ilona Sorrel M.D.   On: 06/13/2016 15:54   I have personally reviewed and evaluated these images and lab results as part of my medical decision-making.   EKG Interpretation   Date/Time:  Thursday June 13 2016 16:54:08 EDT Ventricular Rate:  88 PR Interval:    QRS Duration: 163 QT Interval:  371  QTC Calculation: 449 R Axis:   81 Text Interpretation:  Sinus rhythm Ventricular premature complex Prolonged  PR interval Nonspecific intraventricular conduction delay Minimal ST  depression, diffuse leads Since previous tracing pvc is new Confirmed by  Canary Brim  MD, MARTHA [phone]) on 06/13/2016 4:59:33 PM Also confirmed by Canary Brim   MD, MARTHA [phone]), editor Gilford Rile, [place], [place] ([place])  on 06/14/2016  7:19:39 AM      MDM   Final diagnoses:  Altered mental status, unspecified altered mental status type  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  Reports this has been increasing over the last two weeks but denies any trauma, fevers/infections, falls, or focal neurologic abnormalities.   Ddx stroke/ICH, infection, metabolic abnormality,  over medication  CBC, CMP, ammonia, UA, VBG with no indication of pts AMS.  CT head negative for acute process.  Neuro exam other then mental status with no focal deficits and doubt stroke.  Electrolytes and EKG do not indicate emergent dialysis necessary.  BNP mildly elevated and will require dialysis as inpatient.   Discussed with family medicine who agree to admission.   Labs were viewed by myself and incorporated into medical decision making.  Discussed pertinent finding with patient or caregiver prior to admission with no further questions.  Pt care supervised by my attending Dr. Canary Brim.   Geronimo Boot, MD PGY-3 Emergency Medicine     Geronimo Boot, MD 06/14/16 [place], MD 06/14/16 [phone]

## 2016-06-13 NOTE — ED Notes (Signed)
Attempted Report 

## 2016-06-13 NOTE — ED Notes (Signed)
Pt daughter at bedside, updated on pt.

## 2016-06-13 NOTE — H&P (Signed)
[facility] Admission History and Physical Service Pager: [phone]  Patient name: Kristi [name] Medical record number: [id number] Date of birth: 12-27-1940 Age: 75 y.o. Gender: female  Primary Care Provider: Georges Lynch, MD Consultants: Palliative Code Status: None  Chief Complaint: Weakness, confusion  Assessment and Plan: Kristi [name] is a 75 y.o. female presenting with weakness, confusion. PMH is significant for ESRD on dialysis, CAD s/p stent, HFpHF, DM, COPD, HLD, osteoporosis, Gout, Afib.  Weakness / Confusion. Broad differential. Patient currently oriented to person, place, year, and month. Work up thus far includes negative CT head, normal ammonia, and normal blood glucose. No severe electrolyte abnormalities. Not acidotic. BUN only mildly elevated - would not expect this to cause symptoms. Patient has history of depression, which could be playing a role. Pyuria also noted on UA, though patient without dysuria. No other signs of infection.  - Check TSH, RPR, B12 - Check urine culture - PT/OT consults  Pyuria. UA with large leukocytes and large Hgb. WBC TNTC. No other obvious signs of UTI, but history unreliable. Patient No other signs of systemic illness. May be contributing to above. - Check urine culture.   ESRD on HD.  TTS schedule. Sees Dr Mercy Moore. Missed HD on day of admission. K5.4 without EKG changes. Appears mildly overloaded, but no acute indications for dialysis. Patient stated that she did not want to have dialysis going forward. - Work up for confusion and weakness as above - Palliative care consult in AM to discuss goals of care - give 1 dose of lasix 67m IV for LE edema and mild hyperkalemia  - Check BMP in AM - Continue home phoslo, midodrine - Monitor on telemetry   Afib. Chads2vasc score 8. NSR on admission. Sub therapeutic INR at 1.18.  - Coumadin per pharmacy  HFpEF. Ef 55-60% with grade 2 dialstolic dysfunction on echo on  12/25/2015 - Lasix as above  T2DM. No current medications. A1c 5.4 in December 2016. - Monitor glucose on CBG  COPD. Stable - Continue home tiotroprium, dulera,  and albuterol.   CAD.  Stent in 02/2010.  - Continue home pravastatin. - Consider adding ASA 84mto regimen pending goals of care discussion.   Gout. Stable - Continue allopurinol  FEN/GI: NPO until mentals status clear, protonix Prophylaxis: lovenox  Disposition: Admitted to telemetry pending above management. Anticipate discharge to SNF or hospice pending goals of care discussion.   History of Present Illness:  Kristi HAVLIKs a [age].o. female presenting with weakness and confusion. History is provided by the patient and her daughter.  Daughter states that the patient has had a progressive decline over the past few weeks. Prior to this, she was fairly independent. She was able to drive, cook for herself, groom herself, and manage her medications. She lives alone, however the daughter has a camera in her house so that she can "keep an eye on her." Last night, patient fell asleep in her chair, which is unusual for the patient. This morning, the patient's daughters tried to move her from her chair but she was very weak and unable to be moved. EMS was called and the patient was then brought to the hospital.  Patient's daughter says that over the past day the patient has been a little more confused and was not able to remember her own birthday today. Patient's daughter is also concerned that the patient is not able to take care of herself any longer.  Patient missed dialysis  today because of her weakness. She denies any shortness of breath or chest pain, but has noticed increased swelling in her lower extremities. No fevers or chills. No nausea or vomiting.   Review Of Systems: Per HPI. Otherwise the remainder of the systems were negative.  Patient Active Problem List   Diagnosis Date Noted  . Altered mental status 06/13/2016   . Cystocele with rectocele 03/18/2016  . Vertigo 02/12/2016  . Hyperparathyroidism, secondary ([place]) 02/12/2016  . Gross hematuria 02/12/2016  . Flank pain 02/12/2016  . Cerebrovascular accident (CVA) due to occlusion of precerebral artery ([place])   . 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 Hemiparesis affecting left side as late effect of stroke ([place])   . History of CVA (cerebrovascular accident)   . Chronic systolic congestive heart failure ([place])   . Paroxysmal atrial fibrillation (H[facility])   . Macrocytic anemia   . Cerebral infarction ([place])   . ESRD on dialysis ([place])   . Thrombocytopenia ([place])   . Gastric outlet obstruction   . Proliferative diabetic retinopathy ([place]) 12/15/2014  . Pulmonary artery hypertension ([place]) 10/31/2014  . Chronic kidney disease (CKD), stage IV (severe) ([place])   . Essential hypertension, benign   . Chronic right shoulder pain 09/16/2014  . Coronary atherosclerosis of native coronary artery 04/06/2014  . Gout 04/01/2014  . Chronic back pain 07/21/2013  . Diabetic retinopathy associated with type 2 diabetes mellitus ([place]) 09/29/2012  . Osteoporosis 09/11/2012  . History of tobacco abuse 01/11/2011  . MONOCLONAL GAMMOPATHY 07/02/2010  . GERD 03/26/2010  . HYPERLIPIDEMIA 02/19/2010  . COPD (chronic obstructive pulmonary disease) ([place]) 02/19/2010    Past Medical History: Past Medical History  Diagnosis Date  . Hyperlipidemia   . Hyperparathyroidism   . CAD (coronary artery disease)   . Myocardial infarction ([place]) 2008  . Hypertension   . COPD (chronic obstructive pulmonary disease) ([place])   . Diabetes mellitus     diagnosed 2010 - takes oral meds and lantus insulin  . Anemia   . GERD (gastroesophageal reflux disease)     on omeprazole  . Arthritis     all over  . CHF (congestive heart failure) ([place]) 08-2011  . CVA 02/19/2010  . CAD 02/19/2010  . DEEP VENOUS THROMBOPHLEBITIS, LEG, LEFT 03/08/2010    in 2009  s/p hospitalization- only 1 isolated dvt  . Pulmonary artery hypertension ([place]) 10/31/2014  . Shortness of breath dyspnea     with exertion  . Depression     years ago  . Dry skin   . Headache   . Dysrhythmia     atrial fibrillation  . Chronic kidney disease     esrd - since 2010 - DIALYIS     Past Surgical History: Past Surgical History  Procedure Laterality Date  . Av fistula placement  04/05/11    Left Radiocephalic AVF  . Appendectomy  1969  . Tubal ligation    . Revision of fisturl    . Coronary angioplasty with stent placement  06/23/2007    by Dr. Irish Lack is her cardiologist  . Ligation goretex fistula  10/29/2011    Procedure: LIGATION GORE-TEX FISTULA;  Surgeon: Elam Dutch, MD;  Location: [facility] OR;  Service: Vascular;  Laterality: Left;  Ligation of competing branches left forearm fistula  . Fistulogram N/A 02/17/2012    Procedure: FISTULOGRAM;  Surgeon: Angelia Mould, MD;  Location: [facility] CATH LAB;  Service: Cardiovascular;  Laterality: N/A;  . Insertion  of dialysis catheter N/A 01/12/2015    Procedure: INSERTION OF DIALYSIS CATHETER;  Surgeon: Conrad Branchdale, MD;  Location: [place];  Service: Vascular;  Laterality: N/A;  . Esophagogastroduodenoscopy N/A 01/20/2015    Procedure: ESOPHAGOGASTRODUODENOSCOPY (EGD);  Surgeon: Wonda Horner, MD;  Location: [facility] ENDOSCOPY;  Service: Endoscopy;  Laterality: N/A;  . Eye surgery Bilateral     cataract surgery with lens implants  . Tonsillectomy    . Bascilic vein transposition Left 03/24/2015    Procedure: LEFT ARM BASILIC VEIN TRANSPOSITION;  Surgeon: Angelia Mould, MD;  Location: [place];  Service: Vascular;  Laterality: Left;  . Ligation of arteriovenous  fistula Left 03/24/2015    Procedure: LIGATION OF left arm radio-cephalic ARTERIOVENOUS  FISTULA;  Surgeon: Angelia Mould, MD;  Location: [place];  Service: Vascular;  Laterality: Left;  . Revison of arteriovenous fistula Left 12/14/1094    Procedure: PLICATION OF  BASILIC VEIN TRANSPOSITION FISTULA;  Surgeon: Angelia Mould, MD;  Location: [place];  Service: Vascular;  Laterality: Left;    Social History: Social History  Substance Use Topics  . Smoking status: Former Smoker -- 0.50 packs/day for 53 years    Types: Cigarettes    Start date: 12/09/1960    Quit date: 03/31/2014  . Smokeless tobacco: Never Used  . Alcohol Use: 0.0 oz/week    0 Standard drinks or equivalent per week     Comment: occasional   Additional social history: None  Please also refer to relevant sections of EMR.  Family History: Family History  Problem Relation Age of Onset  . Cancer Mother     multiple myloma  . Dementia Father   . Pneumonia Father   . Anuerysm Father    Allergies and Medications: Allergies  Allergen Reactions  . Losartan Other (See Comments)    Causes increase in creatinine and worsening CKD  . Chantix [Varenicline] Nausea Only and Other (See Comments)    Dizzy and GI symptoms    No current facility-administered medications on file prior to encounter.   Current Outpatient Prescriptions on File Prior to Encounter  Medication Sig Dispense Refill  . acetaminophen (TYLENOL) 500 MG tablet Take 1,000 mg by mouth every 6 (six) hours as needed for moderate pain.     Marland Kitchen albuterol (PROVENTIL HFA;VENTOLIN HFA) 108 (90 BASE) MCG/ACT inhaler Inhale into the lungs every 4 (four) hours as needed for wheezing.    . calcium acetate (PHOSLO) 667 MG capsule Take 1,334 mg by mouth 3 (three) times daily with meals.     Marland Kitchen CINNAMON PO Take 1,000 mg by mouth daily.    . ferrous sulfate 325 (65 FE) MG tablet Take 325 mg by mouth daily.    . fexofenadine (ALLEGRA) 180 MG tablet Take 180 mg by mouth daily as needed for allergies or rhinitis.    . furosemide (LASIX) 80 MG tablet Take 80-240 mg by mouth daily. Takes 3 tabs on Sun, Mon, Wed and Fri  Takes 1 tab all other days    . Lancet Devices (ACCU-CHEK SOFTCLIX) lancets Use as instructed 100 each 12  . Lancets  Misc. (ACCU-CHEK SOFTCLIX LANCET DEV) KIT 1 strip by Does not apply route 3 (three) times daily. 1 kit 0  . midodrine (PROAMATINE) 10 MG tablet Take 1 tablet (10 mg total) by mouth 2 (two) times daily with a meal. 30 tablet 3  . mometasone-formoterol (DULERA) 100-5 MCG/ACT AERO Inhale 2 puffs into the lungs 2 (two) times daily as needed  for shortness of breath.    . nitroGLYCERIN (NITROSTAT) 0.4 MG SL tablet Place 0.4 mg under the tongue every 5 (five) minutes as needed. Reported on 04/12/2016    . omeprazole (PRILOSEC) 20 MG capsule Take 1 capsule (20 mg total) by mouth at bedtime. (Patient taking differently: Take 20 mg by mouth daily as needed (only takes when eating late in the night). ) 90 capsule 3  . Polyethyl Glycol-Propyl Glycol (SYSTANE OP) Apply 1-2 drops to eye 4 (four) times daily as needed. For dry eyes    . pravastatin (PRAVACHOL) 40 MG tablet TAKE 1 TABLET BY MOUTH EVERY DAY 90 tablet 3  . Tiotropium Bromide Monohydrate 2.5 MCG/ACT AERS Inhale 2 puffs into the lungs daily. 1 Inhaler 11  . vitamin B-12 (CYANOCOBALAMIN) 1000 MCG tablet Take 1,000 mcg by mouth every morning.     . warfarin (COUMADIN) 5 MG tablet TAKE AS DIRECTED BY COUMADIN CLINIC 35 tablet 3  . warfarin (COUMADIN) 5 MG tablet Take 1 tablet (5 mg total) by mouth one time only at 6 PM. 35 tablet 3   Objective: BP 149/73 mmHg  Pulse 93  Temp(Src) 98.4 F (36.9 C)  Resp 24  SpO2 97% Exam: General: 75yo female in NAD resting in bed Eyes:  PERRL, EOMI ENTM: MMM, O/P clear Neck:  Supple, FROM Cardiovascular: RRR, no murmurs noted Respiratory: NWOB, CTAB over anterior fields, unable to assess Abdomen: Mildly distended. No tenderness. +BS MSK: 2+ edema to knees bilaterally. No cyanosis.  Skin: Warm, dry, no rashes Neuro: Alert to person, place, year, and month. CN2-12 intact. Mildly weak in LUE compared to RUE. Otherwise generalized weakness. Moves all extremities.  Psych: Blunted affect. No apparent SI or HI.    Labs and Imaging: CBC BMET   Recent Labs Lab 06/13/16 1529  WBC 6.3  HGB 10.9*  HCT 34.1*  PLT 111*    Recent Labs Lab 06/13/16 1529  NA 134*  K 5.4*  CL 98*  CO2 29  BUN 27*  CREATININE 5.57*  GLUCOSE 131*  CALCIUM 9.1     VBG: 7.4 / 34 / 28.1 Phos 5.1 Mg 2.0 BNP 836.6 INR 1.18  EKG: NSR, no acute ischemic changes.  Ct Head Wo Contrast  06/13/2016  CLINICAL DATA:  Altered mental status EXAM: CT HEAD WITHOUT CONTRAST TECHNIQUE: Contiguous axial images were obtained from the base of the skull through the vertex without intravenous contrast. COMPARISON:  12/24/2015 FINDINGS: No skull fracture is noted. Paranasal sinuses and mastoid air cells are unremarkable. Atherosclerotic calcifications of carotid siphon are noted. Atherosclerotic calcifications of vertebral arteries are noted. No intracranial hemorrhage, mass effect or midline shift. No acute cortical infarction. Stable atrophy and chronic white matter disease. Stable remote infarct left posterior parietal lobe. Stable remote infarct in left occipital lobe. No mass lesion is noted on this unenhanced scan. IMPRESSION: No acute intracranial abnormality. Stable old infarct in left occipital and left posterior parietal lobe. No definite acute cortical infarction. Stable atrophy and chronic white matter disease. Electronically Signed   By: Lahoma Crocker M.D.   On: 06/13/2016 16:25   Dg Chest Portable 1 View  06/13/2016  CLINICAL DATA:  Altered mental status EXAM: PORTABLE CHEST 1 VIEW COMPARISON:  12/24/2015 chest radiograph. FINDINGS: Stable cardiomediastinal silhouette with top-normal heart size. No pneumothorax. No pleural effusion. No overt pulmonary edema. No acute consolidative airspace disease. IMPRESSION: No active disease. Electronically Signed   By: Ilona Sorrel M.D.   On: 06/13/2016 15:54   Caleb  Burnetta Sabin, MD 06/13/2016, 8:05 PM PGY-3, [place] Intern pager: [phone], text pages welcome

## 2016-06-13 NOTE — ED Notes (Signed)
Pt. Daughter at bedside.

## 2016-06-14 ENCOUNTER — Inpatient Hospital Stay (HOSPITAL_COMMUNITY): Payer: PRIVATE HEALTH INSURANCE

## 2016-06-14 ENCOUNTER — Ambulatory Visit: Payer: Commercial Managed Care - HMO | Admitting: Gynecology

## 2016-06-14 ENCOUNTER — Encounter (HOSPITAL_COMMUNITY): Payer: Self-pay | Admitting: Nephrology

## 2016-06-14 DIAGNOSIS — N186 End stage renal disease: Secondary | ICD-10-CM

## 2016-06-14 DIAGNOSIS — Z515 Encounter for palliative care: Secondary | ICD-10-CM | POA: Diagnosis not present

## 2016-06-14 DIAGNOSIS — Z7189 Other specified counseling: Secondary | ICD-10-CM

## 2016-06-14 DIAGNOSIS — Z992 Dependence on renal dialysis: Secondary | ICD-10-CM | POA: Diagnosis not present

## 2016-06-14 DIAGNOSIS — R531 Weakness: Secondary | ICD-10-CM | POA: Diagnosis not present

## 2016-06-14 DIAGNOSIS — R4182 Altered mental status, unspecified: Secondary | ICD-10-CM

## 2016-06-14 DIAGNOSIS — I132 Hypertensive heart and chronic kidney disease with heart failure and with stage 5 chronic kidney disease, or end stage renal disease: Secondary | ICD-10-CM | POA: Diagnosis not present

## 2016-06-14 LAB — COMPREHENSIVE METABOLIC PANEL
ALT: 8 U/L — ABNORMAL LOW (ref 14–54)
ANION GAP: 7 (ref 5–15)
AST: 12 U/L — ABNORMAL LOW (ref 15–41)
Albumin: 1.7 g/dL — ABNORMAL LOW (ref 3.5–5.0)
Alkaline Phosphatase: 120 U/L (ref 38–126)
BILIRUBIN TOTAL: 0.4 mg/dL (ref 0.3–1.2)
BUN: 31 mg/dL — ABNORMAL HIGH (ref 6–20)
CALCIUM: 8.4 mg/dL — AB (ref 8.9–10.3)
CO2: 24 mmol/L (ref 22–32)
Chloride: 102 mmol/L (ref 101–111)
Creatinine, Ser: 6.1 mg/dL — ABNORMAL HIGH (ref 0.44–1.00)
GFR, EST AFRICAN AMERICAN: 7 mL/min — AB (ref >60)
GFR, EST NON AFRICAN AMERICAN: 6 mL/min — AB (ref >60)
Glucose, Bld: 82 mg/dL (ref 65–99)
POTASSIUM: 5.9 mmol/L — AB (ref 3.5–5.1)
Sodium: 133 mmol/L — ABNORMAL LOW (ref 135–145)
TOTAL PROTEIN: 4.6 g/dL — AB (ref 6.5–8.1)

## 2016-06-14 LAB — CBC
HEMATOCRIT: 29 % — AB (ref 36.0–46.0)
HEMOGLOBIN: 9.5 g/dL — AB (ref 12.0–15.0)
MCH: 33.9 pg (ref 26.0–34.0)
MCHC: 32.8 g/dL (ref 30.0–36.0)
MCV: 103.6 fL — ABNORMAL HIGH (ref 78.0–100.0)
Platelets: 100 10*3/uL — ABNORMAL LOW (ref 150–400)
RBC: 2.8 MIL/uL — ABNORMAL LOW (ref 3.87–5.11)
RDW: 17.7 % — ABNORMAL HIGH (ref 11.5–15.5)
WBC: 6.4 10*3/uL (ref 4.0–10.5)

## 2016-06-14 LAB — PROTIME-INR
INR: 1.17 (ref 0.00–1.49)
PROTHROMBIN TIME: 15.1 s (ref 11.6–15.2)

## 2016-06-14 LAB — TSH: TSH: 2.546 u[IU]/mL (ref 0.350–4.500)

## 2016-06-14 LAB — URINE CULTURE: Culture: NO GROWTH

## 2016-06-14 LAB — RPR: RPR: NONREACTIVE

## 2016-06-14 LAB — MRSA PCR SCREENING: MRSA BY PCR: NEGATIVE

## 2016-06-14 LAB — VITAMIN B12: Vitamin B-12: 876 pg/mL (ref 180–914)

## 2016-06-14 MED ORDER — HALOPERIDOL LACTATE 5 MG/ML IJ SOLN: 0.5000 mg | Freq: Four times a day (QID) | INTRAMUSCULAR | Status: DC | PRN

## 2016-06-14 MED ORDER — WARFARIN SODIUM 7.5 MG PO TABS
7.5000 mg | ORAL_TABLET | Freq: Once | ORAL | Status: AC
  Administered 2016-06-14: 7.5 mg via ORAL
  Filled 2016-06-14: qty 1

## 2016-06-14 MED ORDER — SODIUM POLYSTYRENE SULFONATE 15 GM/60ML PO SUSP
15.0000 g | Freq: Once | ORAL | Status: AC
  Administered 2016-06-14: 15 g via ORAL
  Filled 2016-06-14: qty 60

## 2016-06-14 MED ORDER — ALLOPURINOL 100 MG PO TABS
100.0000 mg | ORAL_TABLET | Freq: Every day | ORAL | Status: DC
  Administered 2016-06-14 – 2016-06-15 (×2): 100 mg via ORAL
  Filled 2016-06-14 (×2): qty 1

## 2016-06-14 MED ORDER — LORAZEPAM 2 MG/ML IJ SOLN
0.5000 mg | INTRAMUSCULAR | Status: DC | PRN
  Administered 2016-06-15: 0.5 mg via INTRAVENOUS
  Filled 2016-06-14: qty 1

## 2016-06-14 MED ORDER — FENTANYL CITRATE (PF) 100 MCG/2ML IJ SOLN
12.5000 ug | INTRAMUSCULAR | Status: DC | PRN
  Administered 2016-06-15: 12.5 ug via INTRAVENOUS
  Filled 2016-06-14: qty 2

## 2016-06-14 MED ORDER — ONDANSETRON HCL 4 MG/2ML IJ SOLN: 4.0000 mg | Freq: Four times a day (QID) | INTRAMUSCULAR | Status: DC | PRN

## 2016-06-14 NOTE — Care Management Important Message (Signed)
Important Message  Patient Details  Name: Kristi [name] MRN: [id number] Date of Birth: 25-Jul-1941   Medicare Important Message Given:  Yes    Loann Quill 06/14/2016, 10:39 AM

## 2016-06-14 NOTE — Progress Notes (Signed)
Family Medicine Teaching Service Daily Progress Note Intern Pager: [phone]  Patient name: Kristi [name] Medical record number: [id number] Date of birth: 16-Mar-1941 Age: 75 y.o. Gender: female  Primary Care Provider: Georges Lynch, MD Consultants: Palliative Code Status: None     Pt Overview and Major Events to Date:  7/6:  Admitted for altered mental status, on tele, negative head CT  Assessment and Plan:   Weakness / Confusion. Unclear at this time, patient is currently oriented to person, place, year, and month. Negative head CT, negative UA, with reflex culture pending, negative ammonia, normal blood glucose, TSH, B12.  Patient has history of depression, which could a source of the recent decline.  We are also investigating other possibly causes of confusion like seizures and possible mets or tumors with brain MRI - normal TSH, B12, pending RPR - f/u urine culture  - f/u EEG - f/u MRI head - PT/OT consults  ESRD on HD. TTS schedule. Sees Dr Mercy Moore. Missed HD on day of admission. K5.4 without EKG changes. Appears mildly overloaded, but no acute indications for dialysis. Patient stated that she did not want to have dialysis going forward.  K+ this AM is 5.9, Na+ 133.  Saw patient this AM, seems to be amenable to dialysis, will see palliative care this AM. Will follow recs.  Also put in nephrology consult.  Patient not competent to make decisions regarding dialysis.   - kayexalate 15mg  and repeat EKG - Work up for confusion and weakness as above - palliative consult this AM, appreciate recs - nephrology consult this AM, appreciate recs  - give 1 dose of lasix 80mg  IV for LE edema and mild hyperkalemia  - Check BMP in AM - Continue home phoslo, midodrine - Monitor on telemetry   Abdominal pain: Unclear of the duration.  Patient points periumbilically, but appears to wince when palpated at the suprapubic area, but also diffusely.  Patient has history of anterior prolapse? planned  for upcoming surgery.  Possibility of interstitial cystitis.  Also no bowel sounds present and patient is unable to tell me when her last BM was.  Possibly constipated. -ordered KUB  Pyuria. UA with large leukocytes and large Hgb. WBC TNTC. No other obvious signs of UTI, but history unreliable. Patient No other signs of systemic illness. May be contributing to above. Currently complaining of vague abdominal pain, patient points to periumbilical area, but not really tender to palpation.   - f/u urine culture    Afib. Chads2vasc score 8. NSR on admission and overnight. Sub therapeutic INR at 1.18.  - Coumadin per pharmacy - f/u INR  HFpEF. Ef 55-60% with grade 2 dialstolic dysfunction on echo on 12/25/2015 - Lasix as above  T2DM. No current medications. A1c 5.4 in December 2016. Glucose 82 this AM.  - Monitor glucose on CBG  COPD. Stable, satting 97-100% RA - Continue home tiotroprium, dulera, and albuterol.   CAD. Stent in 02/2010.  - Continue home pravastatin. - Consider adding ASA 81mg  to regimen pending goals of care discussion.   Gout. Stable  Need to reevaluate allopurinol dose per pharmacy.  Should be on 100mg  renal dosing, currently on 300mg .   - changed dose to 100mg   FEN/GI: NPO until mental status improves, protonix PPx: lovenox  Disposition: Anticipated discharge to SNF or hospice pending goals of care  Subjective:  Kristi [name] is a 75 y.o. female presenting with weakness, confusion, this morning she had a blunted affect and appeared confused.  She was AAOx3,  however had difficulties answering other simple questions.  Unclear of her baseline.    Objective: Temp:  [98 F (36.7 C)-98.4 F (36.9 C)] 98.2 F (36.8 C) (07/07 0850) Pulse Rate:  [62-95] 68 (07/07 0907) Resp:  [14-24] 18 (07/07 0907) BP: (118-158)/(41-77) 153/54 mmHg (07/07 0850) SpO2:  [95 %-100 %] 95 % (07/07 0907) Weight:  [162 lb 4.1 oz (73.6 kg)] 162 lb 4.1 oz (73.6 kg) (07/07 [id number]) Physical  Exam: General: well appearing, NAD Cardiovascular: RRR, s1/s2 present, no MRG Respiratory: normal work of breathing, CTABL Abdomen: obese, soft, mildly tender diffusely and at the suprapubic area, non-distended, no apparent masses Skin:  Warm, dry, no rashes Neuro: AAOx3, generalized weakness Psych:  Blunted affect, confused.   Laboratory:  Recent Labs Lab 06/13/16 1529 06/14/16 0524  WBC 6.3 6.4  HGB 10.9* 9.5*  HCT 34.1* 29.0*  PLT 111* 100*    Recent Labs Lab 06/13/16 1529 06/14/16 0524  NA 134* 133*  K 5.4* 5.9*  CL 98* 102  CO2 29 24  BUN 27* 31*  CREATININE 5.57* 6.10*  CALCIUM 9.1 8.4*  PROT 5.6* 4.6*  BILITOT 0.3 0.4  ALKPHOS 153* 120  ALT 11* 8*  AST 13* 12*  GLUCOSE 131* 82    INR= 1.17 (7/7)  Imaging/Diagnostic Tests: Ct Head Wo Contrast  06/13/2016  CLINICAL DATA:  Altered mental status EXAM: CT HEAD WITHOUT CONTRAST TECHNIQUE: Contiguous axial images were obtained from the base of the skull through the vertex without intravenous contrast. COMPARISON:  12/24/2015 FINDINGS: No skull fracture is noted. Paranasal sinuses and mastoid air cells are unremarkable. Atherosclerotic calcifications of carotid siphon are noted. Atherosclerotic calcifications of vertebral arteries are noted. No intracranial hemorrhage, mass effect or midline shift. No acute cortical infarction. Stable atrophy and chronic white matter disease. Stable remote infarct left posterior parietal lobe. Stable remote infarct in left occipital lobe. No mass lesion is noted on this unenhanced scan. IMPRESSION: No acute intracranial abnormality. Stable old infarct in left occipital and left posterior parietal lobe. No definite acute cortical infarction. Stable atrophy and chronic white matter disease. Electronically Signed   By: Lahoma Crocker M.D.   On: 06/13/2016 16:25   Dg Chest Portable 1 View  06/13/2016  CLINICAL DATA:  Altered mental status EXAM: PORTABLE CHEST 1 VIEW COMPARISON:  12/24/2015 chest  radiograph. FINDINGS: Stable cardiomediastinal silhouette with top-normal heart size. No pneumothorax. No pleural effusion. No overt pulmonary edema. No acute consolidative airspace disease. IMPRESSION: No active disease. Electronically Signed   By: Ilona Sorrel M.D.   On: 06/13/2016 15:54    Eloise Levels, MD 06/14/2016, 12:29 PM PGY-1, [place] Intern pager: [phone], text pages welcome

## 2016-06-14 NOTE — Progress Notes (Addendum)
New Admission Note:  Arrival Method: EMS from home alone, accompanied by daughter Mental Orientation: Alert and oriented x3 Telemetry: Placed on and CCMD called Assessment: Completed Skin: dry and intact, bottom red but blanchable IV: Intact Pain: Generalized ata 6 Tubes: n/a Safety Measures: Safety Fall Prevention Plan was given, discussed and signed Admission: Completed [facility] [place] Orientation: Patient and daughter has been orientated to the room, unit and the staff. Family: Daughter at bedside  Orders have been reviewed and implemented. Will continue to monitor the patient. Call light has been placed within reach and bed alarm has been activated.

## 2016-06-14 NOTE — Consult Note (Signed)
Consultation Note Date: 06/14/2016   Patient Name: Kristi [name]  DOB: April 02, 1941  MRN: [id number]  Age / Sex: 75 y.o., female  PCP: Elberta Leatherwood, MD Referring Physician: Lind Covert, MD  Reason for Consultation: Establishing goals of care  HPI/Patient Profile: 75 y.o. female  with past medical history of ESRD, CAD s/p stent, HFpEF, DM, COPD, HLD, osteoporosis, Gout, Afib admitted on 06/13/2016 with weakness and confusion with increasing trouble tolerating dialysis and reporting that she no longer wants to receive dialysis.   Clinical Assessment and Goals of Care: I met today with patient and 2 daughters (1 via phone) in conjunction with Mariana Kaufman and Dr. Jonnie Finner.  The patient appeared to listen to conversation, and at end of encounter, she was able to verbalize plan and reported being in agreement, however she was limited in her participation throughout encounter.  Family reports that the patient has always been independent and was living alone up until this admission.  Her family has always been the most important thing to her.  They have been planning for her to move back to [facility] [place]) to be closer to larger portion of her family.  We discussed her clinical course to this point in time, including the decline in nutrition, functional status, and cognition that she has experienced over the past several months.  She has been feeling more fatigued and stating that she no longer wants to have dialysis.  We discussed the purpose of dialysis being to add time and quality to her life, and while it has served this purpose in the past, it did not appear that it would continue to serve this purpose moving forward.  We talked about pathways moving forward, prognosis, and plan for symptom management in light of decision not to pursue further dialysis.  SUMMARY OF RECOMMENDATIONS   - Family is in agreement with  plan for no further dialysis at this time.  Greatly appreciate Dr. Jonnie Finner input and meeting in conjunction with our team to discuss case with family.  - Continue to monitor and maintain current care while awaiting results of already ordered tests (MRI, EEG). - Family understands that with her decreased nutrition, functional status, and cognition that this is most likely disease progression.  They report gradual decline in ADLs and feeling poorly after dialysis.  If no reversible cause found on currently ordered workup, we discussed plan to focus on comfort and dignity with likely transition to residential hospice for end of life care.   - Family agrees that ensuring comfort is first priority.  Plan for addition of medications for symptom management as needs arise. - Likely plan will be to shift focus to comfort and seek placement at residential hospice facility (in town vs [place], [place] where she has family) for end of life care.  Code Status/Advance Care Planning:  DNR   Symptom Management:   Currently denies symptoms.  Will plan for addition of as needed medications for symptoms management.  Pain/SOB: Fentanyl 12.55mg Q1H as needed  Agitiation: Haldol  as needed  Nausea: Zofran as needed.  If ineffective, would try haldol or compazine  Anxiety: ativan as needed  Palliative Prophylaxis:   Frequent Pain Assessment  Psycho-social/Spiritual:   Desire for further Chaplaincy support:no  Additional Recommendations: Education on Hospice  Prognosis:   Hours - Days as patient no longer going to receive dialysis.   Discharge Planning: Hospice facility most likely.  Plan to meet again with family tomorrow to continue discussion.      Primary Diagnoses: Present on Admission:  . Altered mental status  I have reviewed the medical record, interviewed the patient and family, and examined the patient. The following aspects are pertinent.  Past Medical History  Diagnosis Date  .  Hyperlipidemia   . Hyperparathyroidism   . CAD (coronary artery disease)   . Myocardial infarction ([place]) 2008  . Hypertension   . COPD (chronic obstructive pulmonary disease) ([place])   . Diabetes mellitus     diagnosed 2010 - takes oral meds and lantus insulin  . Anemia   . GERD (gastroesophageal reflux disease)     on omeprazole  . Arthritis     all over  . CHF (congestive heart failure) ([place]) 08-2011  . CVA 02/19/2010  . CAD 02/19/2010  . DEEP VENOUS THROMBOPHLEBITIS, LEG, LEFT 03/08/2010    in 2009 s/p hospitalization- only 1 isolated dvt  . Pulmonary artery hypertension ([place]) 10/31/2014  . Shortness of breath dyspnea     with exertion  . Depression     years ago  . Dry skin   . Headache   . Dysrhythmia     atrial fibrillation  . Chronic kidney disease     esrd - since feb  2016 - DIALYIS    Social History   Social History  . Marital Status: Widowed    Spouse Name: N/A  . Number of Children: 3  . Years of Education: N/A   Occupational History  . RetiredCatering manager Work    Social History Main Topics  . Smoking status: Former Smoker -- 0.50 packs/day for 53 years    Types: Cigarettes    Start date: 12/09/1960    Quit date: 03/31/2014  . Smokeless tobacco: Never Used  . Alcohol Use: 0.0 oz/week    0 Standard drinks or equivalent per week     Comment: occasional  . Drug Use: No  . Sexual Activity: No   Other Topics Concern  . None   Social History Narrative   Health Care POA: [place]   Emergency Contact: Suzan Slick    End of Life Plan:    Who lives with you: Lives by her self in 1 story home   Any pets:    Diet: Patient typically eats frozen meal/sandwiches.  Has problems standing to cook.   Exercise: Patient does not have a regular exercise plan.   Seatbelts: Patient reports wearing seat belt when in vehicle.   Sun Exposure/Protection: Patient does not wear sun protection.   Hobbies: Crafts, knitting, crotcheting          Family History  Problem Relation Age of Onset  . Cancer Mother     multiple myloma  . Dementia Father   . Pneumonia Father   . Anuerysm Father    Scheduled Meds: . allopurinol  100 mg Oral Daily  . calcium acetate  1,334 mg Oral TID WC  . ipratropium  2.5 mL Inhalation Daily  . loratadine  10 mg Oral Daily  . megestrol  20 mg Oral Daily  . midodrine  10 mg Oral BID WC  . mometasone-formoterol  2 puff Inhalation BID  . pantoprazole  40 mg Oral Daily  . pravastatin  40 mg Oral Daily  . sodium chloride flush  3 mL Intravenous Q12H  . Warfarin - Pharmacist Dosing Inpatient   Does not apply q1800   Continuous Infusions:  PRN Meds:.albuterol, fentaNYL (SUBLIMAZE) injection, haloperidol lactate, LORazepam, nitroGLYCERIN, ondansetron (ZOFRAN) IV Medications Prior to Admission:  Prior to Admission medications   Medication Sig Start Date End Date Taking? Authorizing Provider  acetaminophen (TYLENOL) 500 MG tablet Take 1,000 mg by mouth every 6 (six) hours as needed for moderate pain.    Yes Historical Provider, MD  albuterol (PROVENTIL HFA;VENTOLIN HFA) 108 (90 BASE) MCG/ACT inhaler Inhale into the lungs every 4 (four) hours as needed for wheezing.   Yes Historical Provider, MD  allopurinol (ZYLOPRIM) 300 MG tablet Take 300 mg by mouth daily. 05/21/16  Yes Historical Provider, MD  calcium acetate (PHOSLO) 667 MG capsule Take 1,334 mg by mouth 3 (three) times daily with meals.    Yes Historical Provider, MD  CINNAMON PO Take 1,000 mg by mouth daily.   Yes Historical Provider, MD  ferrous sulfate 325 (65 FE) MG tablet Take 325 mg by mouth daily.   Yes Historical Provider, MD  fexofenadine (ALLEGRA) 180 MG tablet Take 180 mg by mouth daily as needed for allergies or rhinitis.   Yes Historical Provider, MD  furosemide (LASIX) 80 MG tablet Take 80-240 mg by mouth daily. Takes 3 tabs on Sun, Mon, Wed and Fri  Takes 1 tab all other days   Yes Historical Provider, MD  Lancet Devices (ACCU-CHEK  [place]) lancets Use as instructed 05/24/15  Yes Elberta Leatherwood, MD  Lancets Misc. (ACCU-CHEK SOFTCLIX LANCET DEV) KIT 1 strip by Does not apply route 3 (three) times daily. 05/24/15  Yes Elberta Leatherwood, MD  megestrol (MEGACE) 20 MG tablet Take 20 mg by mouth daily. 05/24/16  Yes Historical Provider, MD  midodrine (PROAMATINE) 10 MG tablet Take 1 tablet (10 mg total) by mouth 2 (two) times daily with a meal. 12/28/15  Yes Carlyle Dolly, MD  mometasone-formoterol (DULERA) 100-5 MCG/ACT AERO Inhale 2 puffs into the lungs 2 (two) times daily as needed for shortness of breath.   Yes Historical Provider, MD  nitroGLYCERIN (NITROSTAT) 0.4 MG SL tablet Place 0.4 mg under the tongue every 5 (five) minutes as needed. Reported on 04/12/2016   Yes Historical Provider, MD  omeprazole (PRILOSEC) 20 MG capsule Take 1 capsule (20 mg total) by mouth at bedtime. Patient taking differently: Take 20 mg by mouth daily as needed (only takes when eating late in the night).  06/13/15  Yes Elberta Leatherwood, MD  Polyethyl Glycol-Propyl Glycol (SYSTANE OP) Apply 1-2 drops to eye 4 (four) times daily as needed. For dry eyes   Yes Historical Provider, MD  pravastatin (PRAVACHOL) 40 MG tablet TAKE 1 TABLET BY MOUTH EVERY DAY 09/08/15  Yes Elberta Leatherwood, MD  Tiotropium Bromide Monohydrate 2.5 MCG/ACT AERS Inhale 2 puffs into the lungs daily. 10/06/15  Yes Zenia Resides, MD  vitamin B-12 (CYANOCOBALAMIN) 1000 MCG tablet Take 1,000 mcg by mouth every morning.    Yes Historical Provider, MD  warfarin (COUMADIN) 5 MG tablet TAKE AS DIRECTED BY COUMADIN CLINIC 04/01/16  Yes Jettie Booze, MD  warfarin (COUMADIN) 5 MG tablet Take 1 tablet (5 mg total) by mouth one time only  at 6 PM. 12/28/15 01/02/16  Carlyle Dolly, MD   Allergies  Allergen Reactions  . Losartan Other (See Comments)    Causes increase in creatinine and worsening CKD  . Chantix [Varenicline] Nausea Only and Other (See Comments)    Dizzy and GI symptoms    Review  of Systems  Denies any symptoms, however unsure how reliable ROS is due to mental status  Physical Exam  General: well appearing, NAD Cardiovascular: RRR, s1/s2 present, no MRG Respiratory: normal work of breathing, CTABL Abdomen: obese, soft, mildly tender diffusely and at the suprapubic area, non-distended, no apparent masses Skin: Warm, dry, no rashes Neuro: AAOx3, generalized weakness Psych: Blunted affect, confused.   Vital Signs: BP 106/60 mmHg  Pulse 64  Temp(Src) 97.8 F (36.6 C) (Oral)  Resp 19  Wt 77.1 kg (169 lb 15.6 oz)  SpO2 93% Pain Assessment: No/denies pain   Pain Score: 0-No pain   SpO2: SpO2: 93 % O2 Device:SpO2: 93 % O2 Flow Rate: .   IO: Intake/output summary:  Intake/Output Summary (Last 24 hours) at 06/14/16 2248 Last data filed at 06/14/16 2108  Gross per 24 hour  Intake      0 ml  Output      0 ml  Net      0 ml    LBM:   Baseline Weight: Weight: 73.6 kg (162 lb 4.1 oz) Most recent weight: Weight: 77.1 kg (169 lb 15.6 oz)     Palliative Assessment/Data: 20%   Flowsheet Rows        Most Recent Value   Intake Tab    Referral Department  -- [family medicine]   Unit at Time of Referral  Med/Surg Unit   Palliative Care Primary Diagnosis  Nephrology   Date Notified  06/14/16   Palliative Care Type  New Palliative care   Reason for referral  Clarify Goals of Care   Date of Admission  06/13/16   Date first seen by Palliative Care  06/14/16   # of days Palliative referral response time  0 Day(s)   # of days IP prior to Palliative referral  1   Clinical Assessment    Palliative Performance Scale Score  30%   Pain Max last 24 hours  5   Psychosocial & Spiritual Assessment    Palliative Care Outcomes       Time In: 1425 Time Out: 1550 Time Total: 85 Greater than 50%  of this time was spent counseling and coordinating care related to the above assessment and plan.  Signed by: Micheline Rough, MD   Please contact Palliative Medicine  Team phone at [phone] for questions and concerns.  For individual provider: See Shea Evans

## 2016-06-14 NOTE — Progress Notes (Signed)
Palliative Medicine RN Note: Met pt to discuss referral to PMT/goals/pt hx. Daughter at bedside (cell [phone]). She reports that pt has an advance directive stating no HD, but when it was offered and pt had capacity, she elected to start HD despite previously recorded wishes. Pt has been c/o not liking HD for about a month, but these complaints escalated over the past few days as her abdominal pain has increased. Pt has a hx of "prolapsed womb"; she has tried pessaries 3 times, and all failed. Per daughter pt has appt for work up for surgical intervention 7/26; she feels this pain may be affecting pt's mental status (due to taking pain meds at home) and desire for HD (as she feels bad at baseline now). Pt is due to get XRay today to eval abd pain.  This week started with pt having increased confusion. She slept in her chair one night without getting up to go to bathroom. She has had to use [facility], and the driver described pt having trouble finding keys/getting into house one day after HD. Pt is sleeping well now; daughter reports this is first good sleep since arrival at [facility].   Family planned for pt to move to [place], [place] this week, as one of her daughters is there. Daughter states that Dr McKay came this am and talked about peritoneal dialysis at home. If pt and family decide to continue HD, she will need to transfer services to a [place] provider/dialysis clinic. Daughter also asked about facilities there, as plan is to move pt whether she continues HD or not; explained that facility placement will depend on treatment plan, as she will qualify for hospice services if she stops HD.  Daughter further asked about HCPOA; she and her sister are designees. She asked if she and her sister can force pt to continue HD. Discussed activation of HCPOA only when pt lacks capacity and the function of HCPOA to make choices in line with what pt would be expected to choose. Daughter is conflicted, as pt indicated  one choice on her AD but chose HD when offered. Also discussed inability to either restrain or sedate pt for outpt HD.  Plan for f/u by PMT MD Dr Freeman this afternoon at 1430. Hopefully will have abd XRay done by then.    G. , RN, BSN, CHPN 06/14/2016 12:08 PM Cell [phone] 8:00-4:00 Monday-Friday Office [phone] 

## 2016-06-14 NOTE — Discharge Summary (Signed)
[facility] Discharge Summary  Patient name: Kristi [name] Medical record number: [id number] Date of birth: 1941-02-09 Age: 75 y.o. Gender: female Date of Admission: 06/13/2016  Date of Discharge: 06/15/16  Admitting Physician: Alveda Reasons, MD  Primary Care Provider: Georges Lynch, MD Consultants: Neuro, Palliative care, Nephrology  Indication for Hospitalization: Confusion, weakness  Discharge Diagnoses/Problem List:  Patient Active Problem List   Diagnosis Date Noted  . End stage renal failure on dialysis ([place])   . Altered mental status 06/13/2016  . Weakness 06/13/2016  . Cystocele with rectocele 03/18/2016  . Vertigo 02/12/2016  . Hyperparathyroidism, secondary ([place]) 02/12/2016  . Gross hematuria 02/12/2016  . Flank pain 02/12/2016  . Cerebrovascular accident (CVA) due to occlusion of precerebral artery ([place])   . 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 Hemiparesis affecting left side as late effect of stroke ([place])   . History of CVA (cerebrovascular accident)   . Chronic systolic congestive heart failure ([place])   . Paroxysmal atrial fibrillation (H[facility])   . Macrocytic anemia   . Cerebral infarction ([place])   . ESRD on dialysis ([place])   . Thrombocytopenia ([place])   . Gastric outlet obstruction   . Proliferative diabetic retinopathy ([place]) 12/15/2014  . Pulmonary artery hypertension ([place]) 10/31/2014  . Chronic kidney disease (CKD), stage IV (severe) ([place])   . Essential hypertension, benign   . Chronic right shoulder pain 09/16/2014  . Coronary atherosclerosis of native coronary artery 04/06/2014  . Gout 04/01/2014  . Chronic back pain 07/21/2013  . Diabetic retinopathy associated with type 2 diabetes mellitus ([place]) 09/29/2012  . Osteoporosis 09/11/2012  . History of tobacco abuse 01/11/2011  . MONOCLONAL GAMMOPATHY 07/02/2010  . GERD 03/26/2010  . HYPERLIPIDEMIA 02/19/2010  . COPD (chronic obstructive  pulmonary disease) ([place]) 02/19/2010     Disposition: [facility]    Discharge Condition: Not improved  Discharge Exam:  General: well appearing, NAD Cardiovascular: RRR, s1/s2 present, no MRG Respiratory: normal work of breathing, CTABL Abdomen: obese, soft, mildly tender diffusely and at the suprapubic area, non-distended, no apparent masses Skin: Warm, dry, no rashes Neuro: AAOx3, generalized weakness Psych: Blunted affect, confused.   Brief Hospital Course:  Weakness / Confusion. Etiology of confusion is unclear. Negative head CT, negative UA, negative ammonia, normal blood glucose, TSH, B12. Normal brain MRI, EEG showed presence of triphasic waves that are most commonly seen in renal dysfunction, hepatic disease and thyroid abnormalities. No seizure activity. Likely to be sequale of her renal dysfunction and perhaps a psychiatric component and some dementia.  Patient denied dialysis throughout this admission and with help from Nephrology and Palliative care, the patient ultimately decided on hospice care.    ESRD on HD. Sees Dr Mercy Moore. Missed HD on day of admission. K5.4 without EKG changes. Appears mildly overloaded, but no acute indications for dialysis. Patient stated that she did not want to have dialysis going forward. EEG findings mostly consistent with renal dysfunction, hepatic disease and thyroid abnormalities. No seizure activity. Palliative and Nephrology consults ultimately helped patient decide on hospice care.   Afib. Chads2vasc score 8. NSR on admission and overnight. Sub therapeutic INR at 1.18. Started on coumadin this admission, but will not continue.    Issues for Follow Up:  1.  Comfort care.  Discharged with medications including haldol, lorazepam, fetanyl and zofran and patient will continue home inhalers and systane as needed.   Significant Procedures: None  Significant Labs and  Imaging:   Recent Labs Lab 06/13/16 1529 06/14/16 0524   WBC 6.3 6.4  HGB 10.9* 9.5*  HCT 34.1* 29.0*  PLT 111* 100*    Recent Labs Lab 06/13/16 1529 06/13/16 1712 06/14/16 0524  NA 134*  --  133*  K 5.4*  --  5.9*  CL 98*  --  102  CO2 29  --  24  GLUCOSE 131*  --  82  BUN 27*  --  31*  CREATININE 5.57*  --  6.10*  CALCIUM 9.1  --  8.4*  MG  --  2.0  --   PHOS  --  5.1*  --   ALKPHOS 153*  --  120  AST 13*  --  12*  ALT 11*  --  8*  ALBUMIN 2.2*  --  1.7*      Results/Tests Pending at Time of Discharge: None  Discharge Medications:    Medication List    STOP taking these medications        ACCU-CHEK SOFTCLIX LANCET DEV Kit     accu-chek softclix lancets     acetaminophen 500 MG tablet  Commonly known as:  TYLENOL     allopurinol 300 MG tablet  Commonly known as:  ZYLOPRIM     calcium acetate 667 MG capsule  Commonly known as:  PHOSLO     CINNAMON PO     ferrous sulfate 325 (65 FE) MG tablet     fexofenadine 180 MG tablet  Commonly known as:  ALLEGRA     furosemide 80 MG tablet  Commonly known as:  LASIX     megestrol 20 MG tablet  Commonly known as:  MEGACE     midodrine 10 MG tablet  Commonly known as:  PROAMATINE     mometasone-formoterol 100-5 MCG/ACT Aero  Commonly known as:  DULERA     nitroGLYCERIN 0.4 MG SL tablet  Commonly known as:  NITROSTAT     omeprazole 20 MG capsule  Commonly known as:  PRILOSEC     pravastatin 40 MG tablet  Commonly known as:  PRAVACHOL     vitamin B-12 1000 MCG tablet  Commonly known as:  CYANOCOBALAMIN     warfarin 5 MG tablet  Commonly known as:  COUMADIN      TAKE these medications        albuterol 108 (90 Base) MCG/ACT inhaler  Commonly known as:  PROVENTIL HFA;VENTOLIN HFA  Inhale into the lungs every 4 (four) hours as needed for wheezing.     fentaNYL 100 MCG/2ML injection  Commonly known as:  SUBLIMAZE  Inject 0.25 mLs (12.5 mcg total) into the vein every hour as needed for moderate pain or severe pain (or shortness of breath).      haloperidol lactate 5 MG/ML injection  Commonly known as:  HALDOL  Inject 0.1 mLs (0.5 mg total) into the vein every 6 (six) hours as needed (or nausea).     LORazepam 2 MG/ML injection  Commonly known as:  ATIVAN  Inject 0.25 mLs (0.5 mg total) into the vein every 4 (four) hours as needed for anxiety.     ondansetron 4 MG/2ML Soln injection  Commonly known as:  ZOFRAN  Inject 2 mLs (4 mg total) into the vein every 6 (six) hours as needed for nausea or vomiting (or itching).     SYSTANE OP  Apply 1-2 drops to eye 4 (four) times daily as needed. For dry eyes     Tiotropium Bromide Monohydrate  2.5 MCG/ACT Aers  Inhale 2 puffs into the lungs daily.        Discharge Instructions: Please refer to Patient Instructions section of EMR for full details.  Patient was counseled important signs and symptoms that should prompt return to medical care, changes in medications, dietary instructions, activity restrictions, and follow up appointments.    Janora Norlander, DO 06/15/2016, 3:23 PM PGY-1, [place]

## 2016-06-14 NOTE — Progress Notes (Signed)
PT Cancellation Note  Patient Details Name: Kristi [name] MRN: [id number] DOB: 01-12-1941   Cancelled Treatment:    Reason Eval/Treat Not Completed: Patient at procedure or test/unavailable (pt at MRI. Will follow. )   Philomena Doheny 06/14/2016, 1:45 PM [phone]

## 2016-06-14 NOTE — Progress Notes (Signed)
[place] for warfarin Indication: atrial fibrillation  Allergies  Allergen Reactions  . Losartan Other (See Comments)    Causes increase in creatinine and worsening CKD  . Chantix [Varenicline] Nausea Only and Other (See Comments)    Dizzy and GI symptoms     Patient Measurements: Weight: 162 lb 4.1 oz (73.6 kg)  Vital Signs: Temp: 98.2 F (36.8 C) (07/07 0850) Temp Source: Oral (07/07 0850) BP: 153/54 mmHg (07/07 0850) Pulse Rate: 68 (07/07 0907)  Labs:  Recent Labs  06/13/16 1529 06/13/16 1712 06/14/16 0524  HGB 10.9*  --  9.5*  HCT 34.1*  --  29.0*  PLT 111*  --  100*  LABPROT  --  15.1 15.1  INR  --  1.18 1.17  CREATININE 5.57*  --  6.10*    Estimated Creatinine Clearance: 7 mL/min (by C-G formula based on Cr of 6.1).   Medical History: Past Medical History  Diagnosis Date  . Hyperlipidemia   . Hyperparathyroidism   . CAD (coronary artery disease)   . Myocardial infarction ([place]) 2008  . Hypertension   . COPD (chronic obstructive pulmonary disease) ([place])   . Diabetes mellitus     diagnosed 2010 - takes oral meds and lantus insulin  . Anemia   . GERD (gastroesophageal reflux disease)     on omeprazole  . Arthritis     all over  . CHF (congestive heart failure) ([place]) 08-2011  . CVA 02/19/2010  . CAD 02/19/2010  . DEEP VENOUS THROMBOPHLEBITIS, LEG, LEFT 03/08/2010    in 2009 s/p hospitalization- only 1 isolated dvt  . Pulmonary artery hypertension ([place]) 10/31/2014  . Shortness of breath dyspnea     with exertion  . Depression     years ago  . Dry skin   . Headache   . Dysrhythmia     atrial fibrillation  . Chronic kidney disease     esrd - since feb  2016 - DIALYIS    Assessment: 64 yof presented to the Ed with weakness and confusion. She is on chronic coumadin for history of afib but INR is subtherapeutic. Pt does not know when her last dose was. INR 1.17 today. Hgb 9.5 Plt 100  Goal of Therapy:  INR  2-3 Monitor platelets by anticoagulation protocol: Yes   Plan:  - Continue Warfarin 7.5mg  PO x 1 tonight - Daily INR   Ihor Austin 06/14/2016,10:41 AM

## 2016-06-14 NOTE — Progress Notes (Signed)
PT Cancellation Note  Patient Details Name: ALESSANDRIA STALDER MRN: [id number] DOB: 1940-12-20   Cancelled Treatment:    Reason Eval/Treat Not Completed: Fatigue/lethargy limiting ability to participate (family requested PT attempt eval later in the day as pt has not had any rest. Will follow. )   Philomena Doheny 06/14/2016, 9:15 AM [phone]

## 2016-06-14 NOTE — Progress Notes (Signed)
Sat in on palliative care meeting between [facility] team, 2 daughters (one on the phone) and patient.  For now plan is no dialysis, assess and treat any symptoms, and PC will revisit with family tomorrow.  Will follow.   Kelly Splinter MD [employer] pager [phone]    cell [phone] 06/14/2016, 3:29 PM

## 2016-06-14 NOTE — Procedures (Signed)
Electroencephalogram (EEG) Report  Date of study: 06/14/16   Requesting clinician: Talbert Cage  Reason for study: Evaluate for seizure  Brief clinical history: This is a 75 year old woman admitted for increased weakness and confusion.  Medications:  Current facility-administered medications:  .  albuterol (PROVENTIL) (2.5 MG/3ML) 0.083% nebulizer solution 3 mL, 3 mL, Inhalation, Q4H PRN, Vivi Barrack, MD .  allopurinol (ZYLOPRIM) tablet 100 mg, 100 mg, Oral, Daily, Vivi Barrack, MD, 100 mg at 06/14/16 0852 .  calcium acetate (PHOSLO) capsule 1,334 mg, 1,334 mg, Oral, TID WC, Vivi Barrack, MD, 1,334 mg at 06/14/16 0800 .  fentaNYL (SUBLIMAZE) injection 12.5 mcg, 12.5 mcg, Intravenous, Q1H PRN, Micheline Rough, MD .  haloperidol lactate (HALDOL) injection 0.5 mg, 0.5 mg, Intravenous, Q6H PRN, Micheline Rough, MD .  ipratropium (ATROVENT) nebulizer solution 0.5 mg, 2.5 mL, Inhalation, Daily, Vivi Barrack, MD, 0.5 mg at 06/14/16 0906 .  loratadine (CLARITIN) tablet 10 mg, 10 mg, Oral, Daily, Vivi Barrack, MD, 10 mg at 06/14/16 0852 .  LORazepam (ATIVAN) injection 0.5 mg, 0.5 mg, Intravenous, Q4H PRN, Micheline Rough, MD .  megestrol (MEGACE) tablet 20 mg, 20 mg, Oral, Daily, Vivi Barrack, MD, 20 mg at 06/14/16 0852 .  midodrine (PROAMATINE) tablet 10 mg, 10 mg, Oral, BID WC, Vivi Barrack, MD, 10 mg at 06/14/16 1714 .  mometasone-formoterol (DULERA) 100-5 MCG/ACT inhaler 2 puff, 2 puff, Inhalation, BID, Vivi Barrack, MD, 2 puff at 06/14/16 0906 .  nitroGLYCERIN (NITROSTAT) SL tablet 0.4 mg, 0.4 mg, Sublingual, Q5 min PRN, Vivi Barrack, MD .  ondansetron [facility]) injection 4 mg, 4 mg, Intravenous, Q6H PRN, Micheline Rough, MD .  pantoprazole (PROTONIX) EC tablet 40 mg, 40 mg, Oral, Daily, Vivi Barrack, MD, 40 mg at 06/14/16 0852 .  pravastatin (PRAVACHOL) tablet 40 mg, 40 mg, Oral, Daily, Vivi Barrack, MD, 40 mg at 06/14/16 0852 .  sodium chloride flush (NS) 0.9 % injection 3 mL,  3 mL, Intravenous, Q12H, Vivi Barrack, MD, 3 mL at 06/14/16 1000 .  Warfarin - Pharmacist Dosing Inpatient, , Does not apply, q1800, Para March, RPH, 0  at 06/14/16 1800  Description: This is a routine EEG performed using standard international 10-20 electrode placement. A total of 18 channels are recorded, including one for the EKG.  Activating Maneuvers: None performed  Findings:  The EKG channel demonstrates are regular rhythm with a rate of 60-65 beats per minute.   The background consists of predominantly theta activity with a frequency of 5-6 Hz range. There is intermixed delta activity.  Throughout the recording, there are asynchronous large amplitude sharp waves in both frontal and temporal regions, more pronounced on the left motor right. Also seen are frequent triphasic waves throughout the entirety of this recording.  There are no focal asymmetries. No epileptiform discharges are present. No seizures are recorded.    Impression: This is a markedly abnormal EEG due to moderate diffuse generalized slowing with frequent triphasic waves. This pattern is consistent with a global encephalopathic process, nonspecific as to etiology. However, the presence of triphasic waves is most commonly seen in renal dysfunction, hepatic disease, and thyroid abnormalities. There is no evidence of seizure activity on this recording.   Melba Coon, MD [facility]

## 2016-06-14 NOTE — Progress Notes (Signed)
OT Cancellation Note  Patient Details Name: Kristi [name] MRN: [id number] DOB: 1941-03-23   Cancelled Treatment:    Reason Eval/Treat Not Completed: Patient at procedure or test/ unavailable. MRI then EEG. Will follow.  Malka So 06/14/2016, 2:01 PM

## 2016-06-14 NOTE — Progress Notes (Signed)
EEG completed full report to follow.

## 2016-06-14 NOTE — Care Management Obs Status (Signed)
[facility] NOTIFICATION   Patient Details  Name: ISRA MATHEW MRN: [id number] Date of Birth: [birth year]   Medicare Observation Status Notification Given:  Yes  Code 44 given and explained to pt family.    Allis Quirarte, Rory Percy, RN 06/14/2016, 3:52 PM

## 2016-06-15 DIAGNOSIS — R4182 Altered mental status, unspecified: Secondary | ICD-10-CM | POA: Diagnosis not present

## 2016-06-15 DIAGNOSIS — I132 Hypertensive heart and chronic kidney disease with heart failure and with stage 5 chronic kidney disease, or end stage renal disease: Secondary | ICD-10-CM | POA: Diagnosis not present

## 2016-06-15 LAB — PROTIME-INR
INR: 2.32 — AB (ref 0.00–1.49)
Prothrombin Time: 25.2 seconds — ABNORMAL HIGH (ref 11.6–15.2)

## 2016-06-15 MED ORDER — LORAZEPAM 2 MG/ML IJ SOLN: 0.5000 mg | INTRAMUSCULAR | Status: AC | PRN

## 2016-06-15 MED ORDER — FENTANYL CITRATE (PF) 100 MCG/2ML IJ SOLN: 12.5000 ug | INTRAMUSCULAR | Status: AC | PRN

## 2016-06-15 MED ORDER — ONDANSETRON HCL 4 MG/2ML IJ SOLN: 4.0000 mg | Freq: Four times a day (QID) | INTRAMUSCULAR | Status: AC | PRN

## 2016-06-15 MED ORDER — WARFARIN - PHARMACIST DOSING INPATIENT: Freq: Every day | Status: DC

## 2016-06-15 MED ORDER — WARFARIN SODIUM 1 MG PO TABS
1.0000 mg | ORAL_TABLET | Freq: Once | ORAL | Status: DC
  Filled 2016-06-15: qty 1

## 2016-06-15 MED ORDER — HALOPERIDOL LACTATE 5 MG/ML IJ SOLN: 0.5000 mg | Freq: Four times a day (QID) | INTRAMUSCULAR | Status: AC | PRN

## 2016-06-15 NOTE — Progress Notes (Signed)
PT Cancellation Note  Patient Details Name: Kristi [name] MRN: [id number] DOB: [birth year]   Cancelled Treatment:    Reason Eval/Treat Not Completed: Other (comment) (Pt transitioning to comfort care.  PT will sign off.)   Collie Siad PT, DPT  Pager: [phone] Phone: [phone] 06/15/2016, 2:32 PM

## 2016-06-15 NOTE — Progress Notes (Signed)
Report given to Helene Kelp, Therapist, sports at [facility].

## 2016-06-15 NOTE — Progress Notes (Signed)
75 Daughter seen by MD . Pt placed on facitliy hospice . Publishing rights manager on staff  today aware

## 2016-06-15 NOTE — Progress Notes (Signed)
0800 With good swallowing meds  Crushed . Taken with apple sauce , Gagging reflex intact

## 2016-06-15 NOTE — Clinical Social Work Note (Signed)
Referral received this afternoon. CSW unable to complete full assessment with patient and family due to timing of referral. CSW was able to get patient bed at [facility]. Per MD patient ready to DC to [facility]. RN, patient/family (at bedside), and facility notified of patient's DC. RN given number for report. DC packet on patient's chart. Ambulance transport requested for patient. CSW signing off at this time.   Liz Beach MSW, [place], [place], [phone]

## 2016-06-15 NOTE — Progress Notes (Signed)
Tele discontinued.

## 2016-06-15 NOTE — Progress Notes (Signed)
OT Cancellation Note  Patient Details Name: BREAZIA REDHEAD MRN: [id number] DOB: [birth year]   Cancelled Treatment:    Reason Eval/Treat Not Completed: Other (comment) (Plan is residential hospice facility. ) Spoke with LCSW, plan is hospice/comfort care. OT signing off.  Hortencia Pilar 06/15/2016, 2:36 PM

## 2016-06-15 NOTE — Care Management (Signed)
Patient has been accepted and will be discharged to [facility] inpatient hospice today as per CSW.

## 2016-06-15 NOTE — Discharge Instructions (Signed)
Kristi [name], you are being discharged to [facility] this afternoon, which is a hospice home in [place].  Palliative care has arranged for you to be comfortable for your time there.  It was a pleasure meeting you.

## 2016-06-15 NOTE — Progress Notes (Addendum)
[place] for warfarin Indication: atrial fibrillation  Allergies  Allergen Reactions  . Losartan Other (See Comments)    Causes increase in creatinine and worsening CKD  . Chantix [Varenicline] Nausea Only and Other (See Comments)    Dizzy and GI symptoms     Patient Measurements: Height: 5' (152.4 cm) Weight: 169 lb 15.6 oz (77.1 kg) IBW/kg (Calculated) : 45.5  Vital Signs: Temp: 97.7 F (36.5 C) (07/08 0824) Temp Source: Oral (07/08 0824) BP: 124/55 mmHg (07/08 0858) Pulse Rate: 60 (07/08 0858)  Labs:  Recent Labs  06/13/16 1529 06/13/16 1712 06/14/16 0524 06/15/16 0538  HGB 10.9*  --  9.5*  --   HCT 34.1*  --  29.0*  --   PLT 111*  --  100*  --   LABPROT  --  15.1 15.1 25.2*  INR  --  1.18 1.17 2.32*  CREATININE 5.57*  --  6.10*  --     Estimated Creatinine Clearance: 7.2 mL/min (by C-G formula based on Cr of 6.1).   Medical History: Past Medical History  Diagnosis Date  . Hyperlipidemia   . Hyperparathyroidism   . CAD (coronary artery disease)   . Myocardial infarction ([place]) 2008  . Hypertension   . COPD (chronic obstructive pulmonary disease) ([place])   . Diabetes mellitus     diagnosed 2010 - takes oral meds and lantus insulin  . Anemia   . GERD (gastroesophageal reflux disease)     on omeprazole  . Arthritis     all over  . CHF (congestive heart failure) ([place]) 08-2011  . CVA 02/19/2010  . CAD 02/19/2010  . DEEP VENOUS THROMBOPHLEBITIS, LEG, LEFT 03/08/2010    in 2009 s/p hospitalization- only 1 isolated dvt  . Pulmonary artery hypertension ([place]) 10/31/2014  . Shortness of breath dyspnea     with exertion  . Depression     years ago  . Dry skin   . Headache   . Dysrhythmia     atrial fibrillation  . Chronic kidney disease     esrd - since feb  2016 - DIALYIS    Assessment: 74 yof presented to the Ed with weakness and confusion. She is on warfarin for AFib, INR is now therapeutic. INR 1.1 > 2.3, large  jump after two doses. Hgb 9.5 Plt 100  PTA dose: 2.5 mg Tues/Thurs, 5 mg all other days  Goal of Therapy:  INR 2-3 Monitor platelets by anticoagulation protocol: Yes   Plan:  -Warfarin 1 mg po x1 -Daily INR    Thadd Apuzzo, Jake Church 06/15/2016,1:56 PM

## 2016-06-15 NOTE — Clinical Social Work Note (Signed)
CSW received call from palliative MD. Family has decided on residential hospice placement. CSW has made referrals to [facility], [facility], and [facility]. Waiting for responses from facilities.    Liz Beach MSW, [place], [place], [phone]

## 2016-06-15 NOTE — Consult Note (Signed)
[facility] Received request from [place] for family interest in [facility]. Chart reviewed and met with family to complete paperwork for transfer this afternoon. Dr. Orpah Melter to assume care per family request.   Please fax discharge summary to [phone].  RN please call report to [phone].  Thank you,  Erling Conte, LCSW [phone]

## 2016-06-15 NOTE — Progress Notes (Signed)
PTAR arrived. IV removed. Tele discontinued. Prescriptions given to PTAR. Report has been called to [place], [place]

## 2016-06-19 ENCOUNTER — Ambulatory Visit: Payer: Self-pay | Admitting: Cardiology

## 2016-06-19 ENCOUNTER — Telehealth: Payer: Self-pay | Admitting: *Deleted

## 2016-06-19 NOTE — Telephone Encounter (Signed)
Pt's daughter called to inform us that Kristi [name] passed away last evening at [facility]

## 2016-07-05 ENCOUNTER — Telehealth: Payer: Self-pay

## 2016-07-05 NOTE — Telephone Encounter (Signed)
Dr. Loetta Rough- Patient's daughter called this afternoon to let us know her Mom passed away and won't be seeing Dr. Zigmund Daniel at [facility] for surgery. Condolences offered and Dr. [place] Pugh office notified.

## 2016-07-09 DEATH — deceased

## 2016-11-04 ENCOUNTER — Ambulatory Visit (INDEPENDENT_AMBULATORY_CARE_PROVIDER_SITE_OTHER): Payer: Self-pay | Admitting: Ophthalmology
